# Patient Record
Sex: Male | Born: 1935
Health system: Southern US, Community
[De-identification: ages and names within clinical notes are randomized; demographics above are authoritative.]

## PROBLEM LIST (undated history)

## (undated) DIAGNOSIS — R51 Headache: Secondary | ICD-10-CM

## (undated) DIAGNOSIS — I2510 Atherosclerotic heart disease of native coronary artery without angina pectoris: Secondary | ICD-10-CM

## (undated) DIAGNOSIS — H409 Unspecified glaucoma: Secondary | ICD-10-CM

## (undated) DIAGNOSIS — I4891 Unspecified atrial fibrillation: Secondary | ICD-10-CM

## (undated) DIAGNOSIS — Z952 Presence of prosthetic heart valve: Secondary | ICD-10-CM

## (undated) DIAGNOSIS — C801 Malignant (primary) neoplasm, unspecified: Secondary | ICD-10-CM

## (undated) DIAGNOSIS — K219 Gastro-esophageal reflux disease without esophagitis: Secondary | ICD-10-CM

## (undated) DIAGNOSIS — M419 Scoliosis, unspecified: Secondary | ICD-10-CM

## (undated) DIAGNOSIS — E785 Hyperlipidemia, unspecified: Secondary | ICD-10-CM

## (undated) DIAGNOSIS — M5430 Sciatica, unspecified side: Secondary | ICD-10-CM

## (undated) DIAGNOSIS — M1990 Unspecified osteoarthritis, unspecified site: Secondary | ICD-10-CM

## (undated) DIAGNOSIS — G8929 Other chronic pain: Secondary | ICD-10-CM

## (undated) DIAGNOSIS — I429 Cardiomyopathy, unspecified: Secondary | ICD-10-CM

## (undated) DIAGNOSIS — I10 Essential (primary) hypertension: Secondary | ICD-10-CM

## (undated) DIAGNOSIS — M545 Low back pain, unspecified: Secondary | ICD-10-CM

## (undated) DIAGNOSIS — R519 Headache, unspecified: Secondary | ICD-10-CM

## (undated) DIAGNOSIS — I25719 Atherosclerosis of autologous vein coronary artery bypass graft(s) with unspecified angina pectoris: Secondary | ICD-10-CM

## (undated) DIAGNOSIS — I509 Heart failure, unspecified: Secondary | ICD-10-CM

## (undated) DIAGNOSIS — H9190 Unspecified hearing loss, unspecified ear: Secondary | ICD-10-CM

## (undated) HISTORY — DX: Unspecified glaucoma: H40.9

## (undated) HISTORY — PX: CATARACT EXTRACTION, BILATERAL: SHX1313

## (undated) HISTORY — PX: HERNIA REPAIR: SHX51

## (undated) HISTORY — DX: Presence of prosthetic heart valve: Z95.2

## (undated) HISTORY — DX: Hyperlipidemia, unspecified: E78.5

## (undated) HISTORY — DX: Essential (primary) hypertension: I10

## (undated) HISTORY — DX: Heart failure, unspecified: I50.9

## (undated) HISTORY — DX: Gastro-esophageal reflux disease without esophagitis: K21.9

## (undated) HISTORY — DX: Unspecified atrial fibrillation: I48.91

## (undated) HISTORY — DX: Malignant (primary) neoplasm, unspecified: C80.1

## (undated) HISTORY — DX: Other chronic pain: G89.29

## (undated) HISTORY — DX: Headache: R51

## (undated) HISTORY — PX: MELANOMA EXCISION: SHX5266

## (undated) HISTORY — DX: Low back pain, unspecified: M54.50

## (undated) HISTORY — DX: Sciatica, unspecified side: M54.30

## (undated) HISTORY — DX: Unspecified osteoarthritis, unspecified site: M19.90

## (undated) HISTORY — DX: Headache, unspecified: R51.9

## (undated) HISTORY — DX: Cardiomyopathy, unspecified: I42.9

## (undated) HISTORY — DX: Low back pain: M54.5

## (undated) HISTORY — DX: Atherosclerosis of autologous vein coronary artery bypass graft(s) with unspecified angina pectoris: I25.719

## (undated) HISTORY — DX: Atherosclerotic heart disease of native coronary artery without angina pectoris: I25.10

## (undated) HISTORY — DX: Scoliosis, unspecified: M41.9

---

## 1961-12-10 HISTORY — PX: NASAL SEPTUM SURGERY: SHX37

## 1992-12-10 HISTORY — PX: MITRAL VALVE REPLACEMENT: SHX147

## 2007-06-18 ENCOUNTER — Encounter: Admission: RE | Admit: 2007-06-18 | Discharge: 2007-06-18 | Payer: Self-pay | Admitting: Internal Medicine

## 2007-09-03 ENCOUNTER — Ambulatory Visit: Payer: Self-pay | Admitting: Internal Medicine

## 2007-09-03 LAB — HM DEXA SCAN

## 2008-03-10 ENCOUNTER — Ambulatory Visit: Payer: Self-pay | Admitting: Internal Medicine

## 2009-12-30 ENCOUNTER — Ambulatory Visit: Payer: Self-pay | Admitting: Internal Medicine

## 2009-12-30 LAB — FECAL OCCULT BLOOD, GUAIAC: Fecal Occult Blood: NEGATIVE

## 2011-06-25 ENCOUNTER — Ambulatory Visit: Payer: Self-pay | Admitting: Internal Medicine

## 2011-08-01 ENCOUNTER — Other Ambulatory Visit: Payer: Self-pay | Admitting: Internal Medicine

## 2011-08-06 ENCOUNTER — Other Ambulatory Visit: Payer: Self-pay | Admitting: Internal Medicine

## 2011-08-06 MED ORDER — WARFARIN SODIUM 5 MG PO TABS: 5.0000 mg | ORAL_TABLET | Freq: Every day | ORAL | Status: DC

## 2011-08-21 ENCOUNTER — Encounter: Payer: Self-pay | Admitting: Internal Medicine

## 2011-08-22 ENCOUNTER — Encounter: Payer: Self-pay | Admitting: Internal Medicine

## 2011-08-22 ENCOUNTER — Ambulatory Visit (INDEPENDENT_AMBULATORY_CARE_PROVIDER_SITE_OTHER): Payer: Medicare Other | Admitting: Internal Medicine

## 2011-08-22 VITALS — BP 172/64 | HR 60 | Temp 98.2°F | Resp 14 | Ht 68.0 in | Wt 149.0 lb

## 2011-08-22 DIAGNOSIS — G589 Mononeuropathy, unspecified: Secondary | ICD-10-CM

## 2011-08-22 DIAGNOSIS — R001 Bradycardia, unspecified: Secondary | ICD-10-CM

## 2011-08-22 DIAGNOSIS — I4891 Unspecified atrial fibrillation: Secondary | ICD-10-CM

## 2011-08-22 DIAGNOSIS — I10 Essential (primary) hypertension: Secondary | ICD-10-CM

## 2011-08-22 DIAGNOSIS — Z7901 Long term (current) use of anticoagulants: Secondary | ICD-10-CM | POA: Insufficient documentation

## 2011-08-22 DIAGNOSIS — L309 Dermatitis, unspecified: Secondary | ICD-10-CM

## 2011-08-22 DIAGNOSIS — G5790 Unspecified mononeuropathy of unspecified lower limb: Secondary | ICD-10-CM

## 2011-08-22 DIAGNOSIS — G629 Polyneuropathy, unspecified: Secondary | ICD-10-CM

## 2011-08-22 DIAGNOSIS — L259 Unspecified contact dermatitis, unspecified cause: Secondary | ICD-10-CM

## 2011-08-22 DIAGNOSIS — Z79899 Other long term (current) drug therapy: Secondary | ICD-10-CM

## 2011-08-22 LAB — COMPREHENSIVE METABOLIC PANEL
ALT: 34 U/L (ref 0–53)
AST: 40 U/L — ABNORMAL HIGH (ref 0–37)
Albumin: 4.3 g/dL (ref 3.5–5.2)
Alkaline Phosphatase: 67 U/L (ref 39–117)
Chloride: 103 mEq/L (ref 96–112)
Potassium: 4.6 mEq/L (ref 3.5–5.1)
Sodium: 141 mEq/L (ref 135–145)
Total Protein: 7.1 g/dL (ref 6.0–8.3)

## 2011-08-22 LAB — VITAMIN B12: Vitamin B-12: 889 pg/mL (ref 211–911)

## 2011-08-22 LAB — TSH: TSH: 1.19 u[IU]/mL (ref 0.35–5.50)

## 2011-08-22 MED ORDER — LOSARTAN POTASSIUM 100 MG PO TABS: 100.0000 mg | ORAL_TABLET | Freq: Every day | ORAL | Status: DC

## 2011-08-22 MED ORDER — LOSARTAN POTASSIUM 100 MG PO TABS: 50.0000 mg | ORAL_TABLET | Freq: Every day | ORAL | Status: DC

## 2011-08-22 NOTE — Assessment & Plan Note (Signed)
INR was 2.7 on Sept 10 2012.  No changes to current regimen.  Repeat monthly

## 2011-08-22 NOTE — Progress Notes (Signed)
Subjective:    Patient ID: Kevin [name], male    DOB: 1925/09/26, 77 y.o.   MRN: [id number]  HPI 77 yo white male with history of atrial fibrillaiton , htn and mitral valve replacement presents with eleated bps and low pulse for the last 2 weeks accompanied by fatigue but no syncope or vertigo.  He denies use of stimulants, oral decongestants or nsaids and no new naturceuticals. .  Deneis hot flashes, diaphoresis and chest pain or dyspnea.  Hr has a history of chronic headaches which have been a lttle bit worse lately but only after straining a muscle in his left side of neck .   Past Medical History  Diagnosis Date  . Atrial fibrillation   . Osteoporosis   . Sciatica   . Chronic headaches started age 23  . History of mitral valve replacement with mechanical valve   . Sciatica    Current Outpatient Prescriptions on File Prior to Visit  Medication Sig Dispense Refill  . amLODipine (NORVASC) 10 MG tablet Take 10 mg by mouth daily.        . Cholecalciferol (VITAMIN D) 2000 UNITS CAPS Take by mouth 2 (two) times daily.        . L-GLUTAMINE PO Take by mouth.        . methocarbamol (ROBAXIN) 750 MG tablet Take 1-2 tablets by mouth three times daily as needed       . NIACINAMIDE PO Take by mouth.        . Nutritional Supplements (PYCNOGENOL) 300-30 MG CAPS Take 1 capsule by mouth daily.        . Omega-3 Fatty Acids (OMEGA 3 PO) Take by mouth.        [name] Calcium 500 MG TABS Take two by mouth daily       . SYRINGE-NEEDLE, DISP, 3 ML (SAFETY-LOK 3CC SYR 22GX1.5") 22G X 1-1/2" 3 ML MISC 2 ml's monthly-given 1 shot monthly       . testosterone cypionate (DEPOTESTOTERONE CYPIONATE) 200 MG/ML injection Intramuscular every three weeks         Review of Systems  Constitutional: Negative for fever, chills, diaphoresis, activity change, appetite change, fatigue and unexpected weight change.  HENT: Negative for hearing loss, ear pain, nosebleeds, congestion, sore throat, facial swelling,  rhinorrhea, sneezing, drooling, mouth sores, trouble swallowing, neck pain, neck stiffness, dental problem, voice change, postnasal drip, sinus pressure, tinnitus and ear discharge.   Eyes: Negative for photophobia, pain, discharge, redness, itching and visual disturbance.  Respiratory: Negative for apnea, cough, choking, chest tightness, shortness of breath, wheezing and stridor.   Cardiovascular: Negative for chest pain, palpitations and leg swelling.  Gastrointestinal: Negative for nausea, vomiting, abdominal pain, diarrhea, constipation, blood in stool, abdominal distention, anal bleeding and rectal pain.  Genitourinary: Negative for dysuria, urgency, frequency, hematuria, flank pain, decreased urine volume, scrotal swelling, difficulty urinating and testicular pain.  Musculoskeletal: Negative for myalgias, back pain, joint swelling, arthralgias and gait problem.  Skin: Negative for color change, rash and wound.  Neurological: Negative for dizziness, tremors, seizures, syncope, speech difficulty, weakness, light-headedness, numbness and headaches.  Psychiatric/Behavioral: Negative for suicidal ideas, hallucinations, behavioral problems, confusion, sleep disturbance, dysphoric mood, decreased concentration and agitation. The patient is not nervous/anxious.    BP 172/64  Pulse 60  Temp(Src) 98.2 F (36.8 C) (Oral)  Resp 14  Ht 5\' 8"  (1.727 m)  Wt 149 lb (67.586 kg)  BMI 22.66 kg/m2  SpO2 98%     Objective:  Physical Exam  Constitutional: He is oriented to person, place, and time.  HENT:  Head: Normocephalic and atraumatic.  Mouth/Throat: Oropharynx is clear and moist.  Eyes: Conjunctivae and EOM are normal.  Neck: Normal range of motion. Neck supple. No JVD present. No thyromegaly present.  Cardiovascular: Normal rate, regular rhythm and normal heart sounds.   Pulmonary/Chest: Effort normal and breath sounds normal. He has no wheezes. He has no rales.  Abdominal: Soft. Bowel sounds  are normal. He exhibits no mass. There is no tenderness. There is no rebound.  Musculoskeletal: Normal range of motion. He exhibits no edema.  Neurological: He is alert and oriented to person, place, and time.  Skin: Skin is warm and dry.  Psychiatric: He has a normal mood and affect.          Assessment & Plan:

## 2011-08-22 NOTE — Patient Instructions (Signed)
We are increasing your losartan to 100 mg daily  For your blood pressure.  You foot do not have a fungal infection, but you are making the skin thin by using the triamcinolone steroid cream.  Please stop the cream, and try benadryl cream if needed for itching.,  We can try amirtiptyline 25 mg at bedtime for the "neuropathy" feeling in your feet.  You may increase the dose to 50 mg after one week if you do no feel any different.  Do not use robaxin while we are trying this mew medication.    If your labs are normal we will ask Dr. Juliann Pares to do a 24 hour Holter monitor to check your rhythm.

## 2011-08-25 ENCOUNTER — Encounter: Payer: Self-pay | Admitting: Internal Medicine

## 2011-08-25 DIAGNOSIS — G5790 Unspecified mononeuropathy of unspecified lower limb: Secondary | ICD-10-CM | POA: Insufficient documentation

## 2011-08-25 DIAGNOSIS — R001 Bradycardia, unspecified: Secondary | ICD-10-CM | POA: Insufficient documentation

## 2011-08-25 DIAGNOSIS — I4891 Unspecified atrial fibrillation: Secondary | ICD-10-CM | POA: Insufficient documentation

## 2011-08-25 DIAGNOSIS — Z952 Presence of prosthetic heart valve: Secondary | ICD-10-CM | POA: Insufficient documentation

## 2011-08-25 DIAGNOSIS — I10 Essential (primary) hypertension: Secondary | ICD-10-CM | POA: Insufficient documentation

## 2011-08-25 DIAGNOSIS — M5430 Sciatica, unspecified side: Secondary | ICD-10-CM | POA: Insufficient documentation

## 2011-08-25 NOTE — Assessment & Plan Note (Signed)
Current loss of control is new, with normal reading in July after starting Losartan 50 mg daily.  Will increase  to 100 mg daily and continue amlodipine at 10 mg daily.  Will need to consider secondary causes including OSA and RAS since over the summer he has required two dose titrations on medications.

## 2011-09-07 ENCOUNTER — Telehealth: Payer: Self-pay | Admitting: *Deleted

## 2011-09-07 NOTE — Telephone Encounter (Signed)
Message copied by Jobie Quaker on Fri Sep 07, 2011 10:46 AM ------      Message from: Duncan Dull      Created: Thu Sep 06, 2011  3:11 PM      Regarding: testosterone levels       His testosterone levels were recently checked by a dr Harrie Foreman in [place] and were sky high.  I need to know when they were checked in relation to dose of testosterone so I can adjust his dose. The labs were drawn on Sept 17th

## 2011-09-10 NOTE — Telephone Encounter (Signed)
Notified patient of message, he stated he had his testosterone injection on Sept. 10th and the office visit and labs with Dr. Harrie Foreman on Sept. 17th.

## 2011-09-10 NOTE — Telephone Encounter (Signed)
Based on his last testosterone level , we should decrease his regimen to 200 mcg once a month, and repeat his level in 3 months, just prior to his next injection

## 2011-09-12 NOTE — Telephone Encounter (Signed)
Notified patient of message, he stated he is due for his next injection and will get it at the new dose tomorrow.

## 2011-09-18 ENCOUNTER — Ambulatory Visit: Payer: Self-pay | Admitting: Internal Medicine

## 2011-09-21 ENCOUNTER — Encounter: Payer: Self-pay | Admitting: Internal Medicine

## 2011-09-21 ENCOUNTER — Ambulatory Visit (INDEPENDENT_AMBULATORY_CARE_PROVIDER_SITE_OTHER): Payer: Medicare Other | Admitting: Internal Medicine

## 2011-09-21 DIAGNOSIS — R001 Bradycardia, unspecified: Secondary | ICD-10-CM

## 2011-09-21 DIAGNOSIS — E291 Testicular hypofunction: Secondary | ICD-10-CM

## 2011-09-21 DIAGNOSIS — I10 Essential (primary) hypertension: Secondary | ICD-10-CM

## 2011-09-21 DIAGNOSIS — I498 Other specified cardiac arrhythmias: Secondary | ICD-10-CM

## 2011-09-21 DIAGNOSIS — G5790 Unspecified mononeuropathy of unspecified lower limb: Secondary | ICD-10-CM

## 2011-09-21 DIAGNOSIS — G629 Polyneuropathy, unspecified: Secondary | ICD-10-CM

## 2011-09-21 DIAGNOSIS — G589 Mononeuropathy, unspecified: Secondary | ICD-10-CM

## 2011-09-21 MED ORDER — AMITRIPTYLINE HCL 25 MG PO TABS: 25.0000 mg | ORAL_TABLET | Freq: Every day | ORAL | Status: DC

## 2011-09-21 MED ORDER — TESTOSTERONE CYPIONATE 200 MG/ML IM SOLN: 100.0000 mg | INTRAMUSCULAR | Status: DC

## 2011-09-21 NOTE — Patient Instructions (Addendum)
You may use the new amitritipyline tablet (25 mg dose)  and cut it in half to take at bedtime.    Your next textosterone level (last one owas Oct 4) should be November 4,  And the dose will be 100 mcg   We will arrange the new testosterone schedule with Boyd Kerbs to lower your dose.  We will send the stress test reults to Dr. Harrie Foreman as soon as we get them, per your request  [phone]  Fax,    phone  [phone]

## 2011-09-23 ENCOUNTER — Encounter: Payer: Self-pay | Admitting: Internal Medicine

## 2011-09-23 DIAGNOSIS — E349 Endocrine disorder, unspecified: Secondary | ICD-10-CM | POA: Insufficient documentation

## 2011-09-23 NOTE — Progress Notes (Signed)
Subjective:    Patient ID: Kevin [name], male    DOB: 1925-06-23, 77 y.o.   MRN: [id number]  HPI  Kevin [name] returns for followup on elebated blood pressure, bradycardai anf fatigue reported one month ago.  In the interim he has had a stress test by Dr. Dwan Bolt and his DO , Dr. Midge Minium checked his testosterone level and it was sky high. The level was check a few days after his most recent testosterone injection of 200 mcg, done by [place] at [facility]. He feels less fatigued lately but has not heard the results if his stress test done earlier this month. The report has not been received by our office either.  Past Medical History  Diagnosis Date  . Atrial fibrillation   . Osteoporosis   . Sciatica   . Chronic headaches started age 29  . History of mitral valve replacement with mechanical valve   . Sciatica     Current Outpatient Prescriptions on File Prior to Visit  Medication Sig Dispense Refill  . amLODipine (NORVASC) 10 MG tablet Take 10 mg by mouth daily.        . brimonidine (ALPHAGAN) 0.15 % ophthalmic solution       . butalbital-acetaminophen-caffeine (FIORICET, ESGIC) 50-325-40 MG per tablet Take 1 tablet by mouth 2 (two) times daily as needed.        . Cholecalciferol (VITAMIN D) 2000 UNITS CAPS Take by mouth 2 (two) times daily.        . L-GLUTAMINE PO Take by mouth.        . latanoprost (XALATAN) 0.005 % ophthalmic solution       . losartan (COZAAR) 100 MG tablet Take 1 tablet (100 mg total) by mouth daily.  30 tablet  3  . methocarbamol (ROBAXIN) 750 MG tablet Take 1-2 tablets by mouth three times daily as needed       . Multiple Vitamin (MULTIVITAMIN) tablet Take 1 tablet by mouth daily.        Marland Kitchen NIACINAMIDE PO Take by mouth.        . Nutritional Supplements (PYCNOGENOL) 300-30 MG CAPS Take 1 capsule by mouth daily.        . Omega-3 Fatty Acids (OMEGA 3 PO) Take by mouth.        Marland Kitchen omeprazole (PRILOSEC) 10 MG capsule Take 10 mg by mouth daily.        [name] Calcium  500 MG TABS Take two by mouth daily       . Probiotic Product (PROBIOTIC FORMULA PO) Take by mouth.        . Red Yeast Rice Extract (RED YEAST RICE PO) Take by mouth.        . SYRINGE-NEEDLE, DISP, 3 ML (SAFETY-LOK 3CC SYR 22GX1.5") 22G X 1-1/2" 3 ML MISC 2 ml's monthly-given 1 shot monthly       . TURMERIC PO Take by mouth.        . warfarin (COUMADIN) 5 MG tablet Take 5 mg by mouth daily. On three days a week take 6 mg          Review of Systems  Constitutional: Negative for fever, chills, diaphoresis, activity change, appetite change, fatigue and unexpected weight change.  HENT: Negative for hearing loss, ear pain, nosebleeds, congestion, sore throat, facial swelling, rhinorrhea, sneezing, drooling, mouth sores, trouble swallowing, neck pain, neck stiffness, dental problem, voice change, postnasal drip, sinus pressure, tinnitus and ear discharge.   Eyes: Negative for photophobia, pain, discharge, redness,  itching and visual disturbance.  Respiratory: Negative for apnea, cough, choking, chest tightness, shortness of breath, wheezing and stridor.   Cardiovascular: Negative for chest pain, palpitations and leg swelling.  Gastrointestinal: Negative for nausea, vomiting, abdominal pain, diarrhea, constipation, blood in stool, abdominal distention, anal bleeding and rectal pain.  Genitourinary: Negative for dysuria, urgency, frequency, hematuria, flank pain, decreased urine volume, scrotal swelling, difficulty urinating and testicular pain.  Musculoskeletal: Negative for myalgias, back pain, joint swelling, arthralgias and gait problem.  Skin: Negative for color change, rash and wound.  Neurological: Negative for dizziness, tremors, seizures, syncope, speech difficulty, weakness, light-headedness, numbness and headaches.  Psychiatric/Behavioral: Negative for suicidal ideas, hallucinations, behavioral problems, confusion, sleep disturbance, dysphoric mood, decreased concentration and agitation. The  patient is not nervous/anxious.       BP 140/50  Pulse 69  Temp(Src) 98.1 F (36.7 C) (Oral)  Resp 14  Ht 5\' 4"  (1.626 m)  Wt 151 lb 8 oz (68.72 kg)  BMI 26.00 kg/m2  SpO2 96%  Objective:   Physical Exam  Constitutional: He is oriented to person, place, and time.  HENT:  Head: Normocephalic and atraumatic.  Mouth/Throat: Oropharynx is clear and moist.  Eyes: Conjunctivae and EOM are normal.  Neck: Normal range of motion. Neck supple. No JVD present. No thyromegaly present.  Cardiovascular: Normal rate, regular rhythm and normal heart sounds.   Pulmonary/Chest: Effort normal and breath sounds normal. He has no wheezes. He has no rales.  Abdominal: Soft. Bowel sounds are normal. He exhibits no mass. There is no tenderness. There is no rebound.  Musculoskeletal: Normal range of motion. He exhibits no edema.  Neurological: He is alert and oriented to person, place, and time.  Skin: Skin is warm and dry.  Psychiatric: He has a normal mood and affect.          Assessment & Plan:

## 2011-09-23 NOTE — Assessment & Plan Note (Addendum)
His dose has been reduced today  to 100 mcg monthly due to recent supratherapeutic level.  Will recheck just before his next injection in three months

## 2011-09-23 NOTE — Assessment & Plan Note (Addendum)
improved control on current regimen,  With resolution of bradycardia.

## 2011-09-23 NOTE — Assessment & Plan Note (Signed)
Results of cardiology evaluation arte pending

## 2011-09-23 NOTE — Assessment & Plan Note (Signed)
He has been taking the elavil butting his pill in 1/4s which has been probelmatic,  Will reduce dose given report of sedation and retry.  Reversible causes have been ruled out.

## 2011-10-26 ENCOUNTER — Telehealth: Payer: Self-pay | Admitting: Internal Medicine

## 2011-10-26 NOTE — Telephone Encounter (Signed)
Left detailed message notifying patient.

## 2011-10-26 NOTE — Telephone Encounter (Signed)
Coumadin level is fine.  No changes, repeat in on e month at [facility] via [facility]

## 2011-11-15 ENCOUNTER — Other Ambulatory Visit: Payer: Self-pay | Admitting: Internal Medicine

## 2011-11-20 LAB — PROTIME-INR

## 2011-11-21 ENCOUNTER — Telehealth: Payer: Self-pay | Admitting: Internal Medicine

## 2011-11-21 NOTE — Telephone Encounter (Signed)
His PT/INR is low .  Has he missed any doses?  What is his current regimen>

## 2011-11-22 NOTE — Telephone Encounter (Signed)
Ok, resume regular regimen and recheck in 2 weeks

## 2011-11-22 NOTE — Telephone Encounter (Signed)
Patient is currently taking 5 mg 4 days a week and 6 mg 3 days a week. He thinks he might have missed one or two doses.

## 2011-11-22 NOTE — Telephone Encounter (Signed)
Patient notified. Will recheck in 2 weeks.

## 2011-12-06 ENCOUNTER — Telehealth: Payer: Self-pay | Admitting: Internal Medicine

## 2011-12-06 NOTE — Telephone Encounter (Signed)
PT/INR drawn on 12/26 was 58.2/5.7 , faxed over by [facility] without any critical level notice.  Received on 12/27.  Patient instructed to suspend coumadin and have [facility] RN recheck on Friday 12/28.  Current regimein 6 mg coumadin 3 days per week, 5mg  all other days,  For goal INR 2.5 to 3.5.  Recently started a probiotic without notifying me.  Patinet advised to stop probiotic.

## 2011-12-07 ENCOUNTER — Telehealth: Payer: Self-pay | Admitting: *Deleted

## 2011-12-07 NOTE — Telephone Encounter (Signed)
Per Dr. Darrick Huntsman-  No coumadin over the weekend and have PT/INR rechecked on Monday. Confirmed no bleeding or bruising. Advised if he started bruising or bleeding to go to ER. We will notify patient of the next step when next results are received.   Patient advised of above and verbalized understanding.

## 2011-12-07 NOTE — Telephone Encounter (Signed)
Joy from [facility] called with STAT results:  PT= 61.0 INR= 6.0

## 2011-12-09 ENCOUNTER — Other Ambulatory Visit: Payer: Self-pay | Admitting: Internal Medicine

## 2011-12-10 ENCOUNTER — Telehealth: Payer: Self-pay | Admitting: *Deleted

## 2011-12-10 LAB — PROTIME-INR

## 2011-12-10 NOTE — Telephone Encounter (Signed)
Patient notified. Per Dr. Darrick Huntsman he is to resume regular dose of coumadin and have PT/INR checked in 2 weeks.

## 2011-12-10 NOTE — Telephone Encounter (Signed)
[facility] called with protime results- PT 16.7, INR 1.6.

## 2011-12-11 HISTORY — PX: CARDIOVERSION: SHX1299

## 2011-12-20 LAB — PROTIME-INR

## 2011-12-21 ENCOUNTER — Telehealth: Payer: Self-pay | Admitting: *Deleted

## 2011-12-21 NOTE — Telephone Encounter (Signed)
Per Dr. Darrick Huntsman, advised pt that his INR is again to high.  Advised him to hold coumadin until 1/13, start back that day at 5 mg's daily.  Recheck 2 weeks.  Pt has appt to be seen here on 1/14.

## 2011-12-24 ENCOUNTER — Ambulatory Visit (INDEPENDENT_AMBULATORY_CARE_PROVIDER_SITE_OTHER): Payer: Medicare Other | Admitting: Internal Medicine

## 2011-12-24 ENCOUNTER — Encounter: Payer: Self-pay | Admitting: Internal Medicine

## 2011-12-24 DIAGNOSIS — R51 Headache: Secondary | ICD-10-CM

## 2011-12-24 DIAGNOSIS — I10 Essential (primary) hypertension: Secondary | ICD-10-CM

## 2011-12-24 DIAGNOSIS — F32A Depression, unspecified: Secondary | ICD-10-CM | POA: Insufficient documentation

## 2011-12-24 DIAGNOSIS — F329 Major depressive disorder, single episode, unspecified: Secondary | ICD-10-CM

## 2011-12-24 DIAGNOSIS — Z7901 Long term (current) use of anticoagulants: Secondary | ICD-10-CM

## 2011-12-24 DIAGNOSIS — E291 Testicular hypofunction: Secondary | ICD-10-CM

## 2011-12-24 MED ORDER — TESTOSTERONE CYPIONATE 200 MG/ML IM SOLN: 100.0000 mg | INTRAMUSCULAR | Status: DC

## 2011-12-24 NOTE — Patient Instructions (Signed)
Resume your probiotic,  And reduce your coumadin dose to 3 mg daily.  Repeat your PT/INR  In 0ne week.

## 2011-12-24 NOTE — Assessment & Plan Note (Addendum)
previously fiorcet dependent.  He has stopped using fiorcet 6 weeks ago and uses only prn tylenol 4 times on average per week

## 2011-12-24 NOTE — Assessment & Plan Note (Signed)
Well controlled on current regimen. Renal function stable, no changes today. 

## 2011-12-24 NOTE — Assessment & Plan Note (Signed)
He has had recent difficulty maintaining a therapeutic INR secondary to recent use of a new probiotic, which was stopped when his INR increased to 6.  Since he wants to resume the probiotic for his bowel health,  Will decrease his coumadin dose t 3 mg daily and recheck PT/inr in one week.

## 2011-12-24 NOTE — Progress Notes (Signed)
Subjective:    Patient ID: Kevin [name], male    DOB: [birth year], 78 y.o.   MRN: [id number]  HPI  78 yo white male with history of chronic headaches, atrial fibrillation  With mitral valve replacement, and low testosterone, presents for regular follow up.  He recently stopped his fioricet 6 weeks ago and the urging of his other physician.  He stopped it abruptly. Felt miserable for 3 weeks due to malaise and body aches but symptoms eventually resolved.  He continues to have mild headaches at the rate of 4 or 5 per week.  He is using tylenol prn headaches are usually frontal in location but occasionally involve the left side in the back of the head. His other physician raised the possibility that he is suffering from depression. 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 He is exercising several days per week.  Past Medical History  Diagnosis Date  . Atrial fibrillation   . Sciatica   . Chronic headaches started age 89  . History of mitral valve replacement with mechanical valve   . Sciatica   . Osteoporosis     secondary to low testoerone    Current Outpatient Prescriptions on File Prior to Visit  Medication Sig Dispense Refill  . amitriptyline (ELAVIL) 25 MG tablet Take 1 tablet (25 mg total) by mouth at bedtime.  30 tablet  2  . amLODipine (NORVASC) 10 MG tablet Take 10 mg by mouth daily.        . brimonidine (ALPHAGAN) 0.15 % ophthalmic solution       . Cholecalciferol (VITAMIN D) 2000 UNITS CAPS Take by mouth 2 (two) times daily.        . L-GLUTAMINE PO Take by mouth.        . latanoprost (XALATAN) 0.005 % ophthalmic solution       . losartan (COZAAR) 100 MG tablet TAKE 1 TABLET BY MOUTH EVERY DAY  30 tablet  2  . methocarbamol (ROBAXIN) 750 MG tablet Take 1-2 tablets by mouth three times daily as needed       . Multiple Vitamin (MULTIVITAMIN) tablet Take 1  tablet by mouth daily.        Marland Kitchen NIACINAMIDE PO Take by mouth.        . Nutritional Supplements (PYCNOGENOL) 300-30 MG CAPS Take 1 capsule by mouth daily.        . Omega-3 Fatty Acids (OMEGA 3 PO) Take by mouth.        Marland Kitchen omeprazole (PRILOSEC) 10 MG capsule Take 10 mg by mouth daily.        [name] Calcium 500 MG TABS Take two by mouth daily       . Probiotic Product (PROBIOTIC FORMULA PO) Take by mouth.        . Red Yeast Rice Extract (RED YEAST RICE PO) Take by mouth.        . SYRINGE-NEEDLE, DISP, 3 ML (SAFETY-LOK 3CC SYR 22GX1.5") 22G X 1-1/2" 3 ML MISC 2 ml's monthly-given 1 shot monthly       . TURMERIC PO Take by mouth.        . warfarin (COUMADIN) 3 MG tablet TAKE 2 TABLETS BY MOUTH DAILY  60 tablet  0  . warfarin (COUMADIN) 5 MG tablet Take 5 mg by mouth daily. On three days a week take 6 mg       .  butalbital-acetaminophen-caffeine (FIORICET, ESGIC) 50-325-40 MG per tablet Take 1 tablet by mouth 2 (two) times daily as needed.          Review of Systems  Constitutional: Negative for fever, chills, diaphoresis, activity change, appetite change, fatigue and unexpected weight change.  HENT: Negative for hearing loss, ear pain, nosebleeds, congestion, sore throat, facial swelling, rhinorrhea, sneezing, drooling, mouth sores, trouble swallowing, neck pain, neck stiffness, dental problem, voice change, postnasal drip, sinus pressure, tinnitus and ear discharge.   Eyes: Negative for photophobia, pain, discharge, redness, itching and visual disturbance.  Respiratory: Negative for apnea, cough, choking, chest tightness, shortness of breath, wheezing and stridor.   Cardiovascular: Negative for chest pain, palpitations and leg swelling.  Gastrointestinal: Negative for nausea, vomiting, abdominal pain, diarrhea, constipation, blood in stool, abdominal distention, anal bleeding and rectal pain.  Genitourinary: Negative for dysuria, urgency, frequency, hematuria, flank pain, decreased urine  volume, scrotal swelling, difficulty urinating and testicular pain.  Musculoskeletal: Negative for myalgias, back pain, joint swelling, arthralgias and gait problem.  Skin: Negative for color change, rash and wound.  Neurological: Negative for dizziness, tremors, seizures, syncope, speech difficulty, weakness, light-headedness, numbness and headaches.  Psychiatric/Behavioral: Negative for suicidal ideas, hallucinations, behavioral problems, confusion, sleep disturbance, dysphoric mood, decreased concentration and agitation. The patient is not nervous/anxious.        Objective:   Physical Exam  Constitutional: He is oriented to person, place, and time.  HENT:  Head: Normocephalic and atraumatic.  Mouth/Throat: Oropharynx is clear and moist.  Eyes: Conjunctivae and EOM are normal.  Neck: Normal range of motion. Neck supple. No JVD present. No thyromegaly present.  Cardiovascular: Normal rate, regular rhythm and normal heart sounds.   Pulmonary/Chest: Effort normal and breath sounds normal. He has no wheezes. He has no rales.  Abdominal: Soft. Bowel sounds are normal. He exhibits no mass. There is no tenderness. There is no rebound.  Musculoskeletal: Normal range of motion. He exhibits no edema.  Neurological: He is alert and oriented to person, place, and time.  Skin: Skin is warm and dry.  Psychiatric: He has a normal mood and affect.          Assessment & Plan:

## 2011-12-24 NOTE — Assessment & Plan Note (Signed)
Discussed mood which has been altered since stopping the fioricet.  Discussed possible trial of lexapro in a few weeks if no improvement

## 2011-12-31 LAB — PROTIME-INR

## 2012-01-01 ENCOUNTER — Telehealth: Payer: Self-pay | Admitting: Internal Medicine

## 2012-01-01 NOTE — Telephone Encounter (Signed)
Patient notified

## 2012-01-01 NOTE — Telephone Encounter (Signed)
Left message asking patient to return my call.

## 2012-01-01 NOTE — Telephone Encounter (Signed)
His coumadin is low on 3 mg daily.  I woul like him to increase to 6 mg on Tuesdays and  Fridays and Sundays  And repeat  PT/. INR in 2 weeks

## 2012-01-07 LAB — PROTIME-INR

## 2012-01-08 ENCOUNTER — Telehealth: Payer: Self-pay | Admitting: *Deleted

## 2012-01-08 NOTE — Telephone Encounter (Signed)
Then, lets have him change to 3mg  daily except for Saturday 6mg .  Follow up INR 1 week.

## 2012-01-08 NOTE — Telephone Encounter (Signed)
Faxed PT report given to Dr. Dan Humphreys in Dr. Melina Schools absence.  Result is 3.6.

## 2012-01-08 NOTE — Telephone Encounter (Signed)
Pt states he takes 3 mg's on Sunday, Monday, Wednesday and Friday and he takes 6 mg's on Tuesday, Thursday and Saturday.

## 2012-01-08 NOTE — Telephone Encounter (Signed)
Based on chart, it appears that coumadin dose is 3mg  daily. If this is correct, I would favor reducing dose to 2mg  daily and repeating INR in 1 week.

## 2012-01-08 NOTE — Telephone Encounter (Signed)
Left message x2 for patient to call back.

## 2012-01-08 NOTE — Telephone Encounter (Signed)
Advised pt

## 2012-01-09 ENCOUNTER — Telehealth: Payer: Self-pay | Admitting: *Deleted

## 2012-01-09 NOTE — Telephone Encounter (Signed)
Prior Berkley Harvey is needed for testosterone, form is in red folder.

## 2012-01-10 NOTE — Telephone Encounter (Signed)
Form completed and faxed back

## 2012-01-11 NOTE — Telephone Encounter (Signed)
Prior auth given for testosterone, approval letter placed in doctor's office for signature and scanning.

## 2012-01-14 ENCOUNTER — Encounter: Payer: Self-pay | Admitting: Internal Medicine

## 2012-01-14 LAB — PROTIME-INR

## 2012-01-16 ENCOUNTER — Telehealth: Payer: Self-pay | Admitting: Internal Medicine

## 2012-01-16 NOTE — Telephone Encounter (Signed)
PT/INR is therapeutic on current regimen.  Repeat in one month

## 2012-01-16 NOTE — Telephone Encounter (Signed)
Advised pt of results.

## 2012-01-28 ENCOUNTER — Other Ambulatory Visit: Payer: Self-pay | Admitting: Internal Medicine

## 2012-01-31 ENCOUNTER — Other Ambulatory Visit: Payer: Self-pay | Admitting: *Deleted

## 2012-01-31 MED ORDER — WARFARIN SODIUM 5 MG PO TABS: 5.0000 mg | ORAL_TABLET | Freq: Every day | ORAL | Status: DC

## 2012-02-03 ENCOUNTER — Other Ambulatory Visit: Payer: Self-pay | Admitting: Internal Medicine

## 2012-02-13 ENCOUNTER — Encounter: Payer: Self-pay | Admitting: Internal Medicine

## 2012-02-13 DIAGNOSIS — K219 Gastro-esophageal reflux disease without esophagitis: Secondary | ICD-10-CM

## 2012-02-15 ENCOUNTER — Telehealth: Payer: Self-pay | Admitting: Internal Medicine

## 2012-02-15 NOTE — Telephone Encounter (Signed)
Patient notified of results.

## 2012-02-15 NOTE — Telephone Encounter (Signed)
Coumadin level is therapeutic,  Continue current regimen and repeat PT/INR in one month 

## 2012-02-21 ENCOUNTER — Encounter: Payer: Self-pay | Admitting: Internal Medicine

## 2012-02-21 DIAGNOSIS — K219 Gastro-esophageal reflux disease without esophagitis: Secondary | ICD-10-CM | POA: Insufficient documentation

## 2012-03-03 ENCOUNTER — Telehealth: Payer: Self-pay | Admitting: Internal Medicine

## 2012-03-03 NOTE — Telephone Encounter (Signed)
Patient called and stated the amitriptyline that he has been taking is causing him dry mouth.  He wanted to know if you could suggest something similar that would not cause dry mouth.  Please advise.

## 2012-03-03 NOTE — Telephone Encounter (Signed)
That is a common side effect of amitriptyline.  Kevin [name] is on such a low dose,  He may be able to stop it an not require anything in  Its place.  Let's stop it and see what changes.

## 2012-03-04 NOTE — Telephone Encounter (Signed)
Patient notified of message, he will stop the amitriptyline.   Chart updated.

## 2012-03-10 LAB — PROTIME-INR

## 2012-03-11 ENCOUNTER — Telehealth: Payer: Self-pay | Admitting: Internal Medicine

## 2012-03-11 NOTE — Telephone Encounter (Signed)
Coumadin level is therapeutic,  Continue current regimen and repeat PT/INR in one month 

## 2012-03-12 NOTE — Telephone Encounter (Signed)
Left message asking patient to return my call.

## 2012-03-13 ENCOUNTER — Encounter: Payer: Self-pay | Admitting: Internal Medicine

## 2012-03-13 ENCOUNTER — Ambulatory Visit (INDEPENDENT_AMBULATORY_CARE_PROVIDER_SITE_OTHER): Payer: Medicare Other | Admitting: Internal Medicine

## 2012-03-13 VITALS — BP 120/56 | HR 68 | Temp 97.6°F | Resp 14 | Wt 152.0 lb

## 2012-03-13 DIAGNOSIS — I10 Essential (primary) hypertension: Secondary | ICD-10-CM

## 2012-03-13 DIAGNOSIS — K759 Inflammatory liver disease, unspecified: Secondary | ICD-10-CM

## 2012-03-13 DIAGNOSIS — R51 Headache: Secondary | ICD-10-CM

## 2012-03-13 DIAGNOSIS — E291 Testicular hypofunction: Secondary | ICD-10-CM

## 2012-03-13 DIAGNOSIS — G5790 Unspecified mononeuropathy of unspecified lower limb: Secondary | ICD-10-CM

## 2012-03-13 DIAGNOSIS — G8929 Other chronic pain: Secondary | ICD-10-CM

## 2012-03-13 DIAGNOSIS — Z952 Presence of prosthetic heart valve: Secondary | ICD-10-CM

## 2012-03-13 DIAGNOSIS — R7989 Other specified abnormal findings of blood chemistry: Secondary | ICD-10-CM

## 2012-03-13 DIAGNOSIS — Z79899 Other long term (current) drug therapy: Secondary | ICD-10-CM

## 2012-03-13 NOTE — Progress Notes (Signed)
Patient ID: Kevin [name], male   DOB: November 10, 1925, 78 y.o.   MRN: [id number]   Patient Active Problem List  Diagnoses  . Encounter for long-term (current) use of anticoagulants  . Atrial fibrillation  . Chronic headaches  . History of mitral valve replacement with mechanical valve  . Sciatica  . Hypertension  . Bradycardia  . Neuropathy of lower extremity  . Low testosterone  . Depression  . Esophageal reflux    Subjective:  CC:   Chief Complaint  Patient presents with  . Follow-up    HPI:   Kevin [name] a 78 y.o. male who presents  For fllow up on chronic medical conditions including atrial fibrillation, hypertension, neuropathy, chronic headaches, low testosterone and osteoporosis.  He feels generally well. He has finally stopped using daily fioricet at Dr. Kendrick Ranch advice.  He is using 1 tylenol daily for headache management.  Has not started any new "natural" supplements after the last probiotic one caused his INR to become supratherapeutic, requiring suspension and modification of his coumadin regimen. His neuropathy has not worsened since stopping the evening dose of elavil which was causing excessive sedation.  The neuropathy is not keeping him awake at night and not associated with pain . He had an elevated testosterone level several months ago (forwarded to [place] by Dr. Isabell Jarvis in Sept) and he was supposed to have a repeat level just prior to his 3 week injection but this has not occurred yet.    Past Medical History  Diagnosis Date  . Atrial fibrillation   . Sciatica   . Chronic headaches started age 89  . History of mitral valve replacement with mechanical valve   . Sciatica   . Osteoporosis     secondary to low testoerone  . Cancer     melanoma- right ear    Past Surgical History  Procedure Date  . Nasal septum surgery 1963  . Cataract extraction, bilateral     x 2  . Melanoma excision     right ear  . Mitral valve replacement 1994  . Hernia repair    x 2         The following portions of the patient's history were reviewed and updated as appropriate: Allergies, current medications, and problem list.    Review of Systems:   12 Pt  review of systems was negative except those addressed in the HPI,     History   Social History  . Marital Status: Widowed    Spouse Name: N/A    Number of Children: N/A  . Years of Education: N/A   Occupational History  . Not on file.   Social History Main Topics  . Smoking status: Never Smoker   . Smokeless tobacco: Never Used  . Alcohol Use: No  . Drug Use: No  . Sexually Active: Not on file   Other Topics Concern  . Not on file   Social History Narrative   Exercise; light 3 x weekLives alone, widowed at [employer]    Objective:  BP 120/56  Pulse 68  Temp(Src) 97.6 F (36.4 C) (Oral)  Resp 14  Wt 152 lb (68.947 kg)  SpO2 97%  General appearance: alert, cooperative and appears stated age Ears: normal TM's and external ear canals both ears Throat: lips, mucosa, and tongue normal; teeth and gums normal Neck: no adenopathy, no carotid bruit, supple, symmetrical, trachea midline and thyroid not enlarged, symmetric, no tenderness/mass/nodules Back: symmetric, no curvature. ROM normal. No CVA  tenderness. Lungs: clear to auscultation bilaterally Heart: regular rate and rhythm, S1, S2 normal, no murmur, click, rub or gallop Abdomen: soft, non-tender; bowel sounds normal; no masses,  no organomegaly Pulses: 2+ and symmetric Skin: Skin color, texture, turgor normal. No rashes or lesions Lymph nodes: Cervical, supraclavicular, and axillary nodes normal.  Assessment and Plan:  Low testosterone in the setting of osteoporosis.  Receiving regular IM injections every 3 weeks,  With repeat level needed prior to next injection.    Neuropathy of lower extremity Reversible causes ruled out previously.  Elavil caused excessive sedation.  Symptosm are tolerable without additional meds at  this point.   Chronic headaches Previously managed with daily use of fiorcet which he has finally stopped at Dr. Kendrick Ranch urging.   Hypertension well controlled on current regimen.  No changes today.   History of mitral valve replacement with mechanical valve Chronic anticoagulation managed with coumadin for goal INR 2. To 3.5      Updated Medication List Outpatient Encounter Prescriptions as of 03/13/2012  Medication Sig Dispense Refill  . amLODipine (NORVASC) 10 MG tablet Take 10 mg by mouth daily.        . brimonidine (ALPHAGAN) 0.15 % ophthalmic solution       . Cholecalciferol (VITAMIN D) 2000 UNITS CAPS Take by mouth 2 (two) times daily.        . L-GLUTAMINE PO Take by mouth.        . latanoprost (XALATAN) 0.005 % ophthalmic solution       . losartan (COZAAR) 100 MG tablet TAKE ONE TABLET BY MOUTH DAILY  90 tablet  0  . Multiple Vitamin (MULTIVITAMIN) tablet Take 1 tablet by mouth daily.        Marland Kitchen NIACINAMIDE PO Take by mouth.        . Nutritional Supplements (PYCNOGENOL) 300-30 MG CAPS Take 1 capsule by mouth daily.        . Omega-3 Fatty Acids (OMEGA 3 PO) Take by mouth.        Marland Kitchen omeprazole (PRILOSEC) 10 MG capsule Take 10 mg by mouth daily.        [name] Calcium 500 MG TABS Take two by mouth daily       . Probiotic Product (PROBIOTIC FORMULA PO) Take by mouth.        . Red Yeast Rice Extract (RED YEAST RICE PO) Take by mouth.        . SYRINGE-NEEDLE, DISP, 3 ML (SAFETY-LOK 3CC SYR 22GX1.5") 22G X 1-1/2" 3 ML MISC 2 ml's monthly-given 1 shot monthly       . testosterone cypionate (DEPOTESTOTERONE CYPIONATE) 200 MG/ML injection Inject 0.5 mLs (100 mg total) into the muscle every 21 ( twenty-one) days.  10 mL  1  . TURMERIC PO Take by mouth.        . warfarin (COUMADIN) 3 MG tablet       . DISCONTD: warfarin (COUMADIN) 3 MG tablet TAKE 2 TABLETS BY MOUTH DAILY  60 tablet  0  . DISCONTD: methocarbamol (ROBAXIN) 750 MG tablet Take 1-2 tablets by mouth three times daily  as needed       . DISCONTD: warfarin (COUMADIN) 5 MG tablet Take 1 tablet (5 mg total) by mouth daily.  90 tablet  0     Orders Placed This Encounter  Procedures  . COMPLETE METABOLIC PANEL WITH GFR  . Direct LDL  . Gamma GT  . Hepatic function panel  . Hepatitis, Acute  .  Testosterone, free, total    No Follow-up on file.

## 2012-03-13 NOTE — Telephone Encounter (Signed)
Patient notified during his office visit today.

## 2012-03-13 NOTE — Patient Instructions (Addendum)
You may try Tylenol PM as an alternative to a amitryptiline for sleep.    If it does not work,  Call for an alternative.   If you stop the probiotic,  Let me know so we can recheck your protime.    Your left sided  chest pain and right groin pain appeat to be due to muscle strain.

## 2012-03-14 LAB — COMPLETE METABOLIC PANEL WITH GFR
ALT: 25 U/L (ref 0–53)
AST: 48 U/L — ABNORMAL HIGH (ref 0–37)
Alkaline Phosphatase: 61 U/L (ref 39–117)
Creat: 0.96 mg/dL (ref 0.50–1.35)
Sodium: 138 mEq/L (ref 135–145)
Total Bilirubin: 0.6 mg/dL (ref 0.3–1.2)
Total Protein: 6.7 g/dL (ref 6.0–8.3)

## 2012-03-16 ENCOUNTER — Telehealth: Payer: Self-pay | Admitting: Internal Medicine

## 2012-03-16 ENCOUNTER — Encounter: Payer: Self-pay | Admitting: Internal Medicine

## 2012-03-16 NOTE — Telephone Encounter (Signed)
I noticed after his last visit that he had not had his testosterone level rechecked yet since Dr. Isabell Jarvis had checked it in September a few days after his last shot, and found it to  Be high. we discussed the need to check it just before his next injection.  (Every 3 weeks [facility] [place] [facility] hi his injection.) We need to coordinate the labs I have ordered with his next injection and have them drawn  Just before it. Since his coumadin level has been stable we can get that on the same day even if it's late by a week.

## 2012-03-16 NOTE — Assessment & Plan Note (Signed)
Reversible causes ruled out previously.  Elavil caused excessive sedation.  Symptosm are tolerable without additional meds at this point.

## 2012-03-16 NOTE — Assessment & Plan Note (Signed)
in the setting of osteoporosis.  Receiving regular IM injections every 3 weeks,  With repeat level needed prior to next injection.

## 2012-03-16 NOTE — Assessment & Plan Note (Signed)
Chronic anticoagulation managed with coumadin for goal INR 2. To 3.5

## 2012-03-16 NOTE — Assessment & Plan Note (Signed)
Previously managed with daily use of fiorcet which he has finally stopped at Dr. Kendrick Ranch urging.

## 2012-03-16 NOTE — Assessment & Plan Note (Signed)
well controlled on current regimen.  No changes today.

## 2012-03-17 NOTE — Telephone Encounter (Signed)
Left message asking patient to return my call.

## 2012-03-18 ENCOUNTER — Other Ambulatory Visit: Payer: Self-pay | Admitting: Internal Medicine

## 2012-03-18 NOTE — Telephone Encounter (Signed)
Left another message for patient to return my call.

## 2012-03-18 NOTE — Telephone Encounter (Signed)
[facility] Triage Call Report Triage Record Num: [id number] Operator: Chevis Pretty Patient Name: Kevin [name] Call Date & Time: 03/18/2012 10:28:02AM Patient Phone: [phone] PCP: Duncan Dull Patient Gender: Male PCP Fax : [phone] Patient DOB: [birth year] Practice Name: [facility] Reason for Call: Caller: [name]/Patient; PCP: Duncan Dull; CB#: [phone]; ; ; Call regarding Returning Call To [facility]; She Contacted Him 03/17/12 and He Has Been Unable To Reach Her.; Per Epic, Dr. Darrick Huntsman wants testosterone level drawn before next injection. States his next injection is 03/24/12. His INR is due to be checked 03/31/12. Dr. Darrick Huntsman would like to coordinate his lab draws; patient states he has his bloodwork drawn at [facility], and is happy to have labs done "whenever Dr. Darrick Huntsman needs them." INFO TO OFFICE FOR STAFF REVIEW/CALLBACK. Patient unavailable 1120-1330. MAY REACH PATIENT AT [phone]. Protocol(s) Used: Office Note Recommended Outcome per Protocol: Information Noted and Sent to Office Reason for Outcome: Caller information to office Care Advice: ~ 03/18/2012 10:38:29AM Page 1 of 1 [id number]

## 2012-03-18 NOTE — Telephone Encounter (Signed)
Caller: Kevin [name]/Patient; PCP: Duncan Dull; CB#: [phone]; ; ; Call regarding Returning Call To [facility]; She Contacted Him 03/17/12 and He Has Been Unable To Reach Her.; Per Epic, Dr. Darrick Huntsman wants testosterone level drawn before next injection.  States his next injection is 03/24/12.  His INR is due to be checked 03/31/12.  Dr. Darrick Huntsman would like to coordinate his lab draws; patient states he has his bloodwork drawn at [facility], and is happy to have labs done "whenever Dr. Darrick Huntsman needs them."  INFO TO OFFICE FOR STAFF REVIEW/CALLBACK. Patient unavailable 1120-1330.  MAY REACH PATIENT AT [phone].

## 2012-03-20 ENCOUNTER — Encounter: Payer: Self-pay | Admitting: Internal Medicine

## 2012-03-20 NOTE — Telephone Encounter (Signed)
Patient stated he is having his coumadin rechecked on Monday of next week.  I will send Boyd Kerbs, RN at [facility] an order for labs you want drawn.

## 2012-03-25 ENCOUNTER — Telehealth: Payer: Self-pay | Admitting: Internal Medicine

## 2012-03-25 NOTE — Telephone Encounter (Signed)
Received labs from [place] . Protime is fine,  And his testosterone level is slightly low,.  So continue q 3 wks injections.

## 2012-03-25 NOTE — Telephone Encounter (Signed)
Patient notified

## 2012-04-03 ENCOUNTER — Encounter: Payer: Self-pay | Admitting: Internal Medicine

## 2012-04-21 LAB — PROTIME-INR

## 2012-04-28 ENCOUNTER — Telehealth: Payer: Self-pay | Admitting: Internal Medicine

## 2012-04-28 DIAGNOSIS — Z7901 Long term (current) use of anticoagulants: Secondary | ICD-10-CM

## 2012-04-28 NOTE — Telephone Encounter (Signed)
Coumadin level from M<ay 13  is too high again. We need to start getting these through [facility].  Please order PT/INR for tommorow and monthly thereafter.  STOP coumadin immediately

## 2012-04-28 NOTE — Telephone Encounter (Signed)
Also called and left message with Yehuda Mao under patient's contacts.

## 2012-04-28 NOTE — Telephone Encounter (Signed)
Tried calling patient again, left another message for him to call me back.

## 2012-04-28 NOTE — Telephone Encounter (Signed)
Left message asking patient to call back

## 2012-04-28 NOTE — Telephone Encounter (Signed)
This is the patient with the elevated INR,. H e needs to stop his coumadin and repeat INR on  Thrusday

## 2012-04-28 NOTE — Telephone Encounter (Signed)
Patient notified as instructed by telephone. Pt said would have INR drawn Thursday at [facility].

## 2012-04-28 NOTE — Telephone Encounter (Signed)
Caller: Kevin [name]/Patient; PCP: Kevin [name]; CB#: [phone]; Returning Call To Kevin [name] Re: Blood Test Done Last Week; he missed call this morning but he will be home all afternoon if she would like to call back.

## 2012-04-28 NOTE — Telephone Encounter (Signed)
Kevin [name], can you please try to reach this patient in my absences. The order for [facility] has already been entered. Patient will just need to be told to go.  Thanks.

## 2012-04-29 ENCOUNTER — Ambulatory Visit: Payer: Self-pay | Admitting: Internal Medicine

## 2012-04-29 ENCOUNTER — Other Ambulatory Visit: Payer: Self-pay | Admitting: Internal Medicine

## 2012-04-30 ENCOUNTER — Ambulatory Visit: Payer: Self-pay | Admitting: Internal Medicine

## 2012-04-30 LAB — PROTIME-INR
INR: 2.6
Prothrombin Time: 28.3 secs — ABNORMAL HIGH (ref 11.5–14.7)

## 2012-05-01 ENCOUNTER — Other Ambulatory Visit: Payer: Self-pay | Admitting: Internal Medicine

## 2012-05-01 LAB — PROTIME-INR

## 2012-05-01 NOTE — Telephone Encounter (Signed)
Done

## 2012-05-02 ENCOUNTER — Telehealth: Payer: Self-pay | Admitting: Internal Medicine

## 2012-05-02 NOTE — Telephone Encounter (Signed)
Patient informed/SLS  

## 2012-05-02 NOTE — Telephone Encounter (Signed)
His coumadin  level is back down after suspending it for 2 days. He can resume 3 mg daily starting today and repeat a PT/INR in 2 weeks

## 2012-05-09 NOTE — Telephone Encounter (Signed)
Gave patient results & MD instructions via phone on 05.24.13 [see phone note/SLS]

## 2012-05-20 ENCOUNTER — Encounter: Payer: Self-pay | Admitting: Internal Medicine

## 2012-06-02 LAB — PULMONARY FUNCTION TEST

## 2012-06-03 ENCOUNTER — Encounter: Payer: Self-pay | Admitting: Internal Medicine

## 2012-06-04 ENCOUNTER — Ambulatory Visit: Payer: Self-pay | Admitting: Specialist

## 2012-06-04 LAB — CREATININE, SERUM
Creatinine: 1.08 mg/dL (ref 0.60–1.30)
EGFR (Non-African Amer.): >60

## 2012-06-10 ENCOUNTER — Telehealth: Payer: Self-pay | Admitting: *Deleted

## 2012-06-10 NOTE — Telephone Encounter (Signed)
Per Dr. Darrick Huntsman he is to hold coumadin and have pt/inr rechecked tomorrow. Patient has been notified. He is going to have it rechecked tomorrow at [facility] .

## 2012-06-10 NOTE — Telephone Encounter (Signed)
Boyd Kerbs called with critical lab. INR of 6.0 and PT of 61.0. Will also be giving verbal to Dr. Darrick Huntsman .

## 2012-06-11 LAB — PROTIME-INR

## 2012-06-13 ENCOUNTER — Encounter: Payer: Self-pay | Admitting: Internal Medicine

## 2012-06-13 ENCOUNTER — Telehealth: Payer: Self-pay | Admitting: Internal Medicine

## 2012-06-13 ENCOUNTER — Ambulatory Visit (INDEPENDENT_AMBULATORY_CARE_PROVIDER_SITE_OTHER): Payer: Medicare Other | Admitting: Internal Medicine

## 2012-06-13 VITALS — BP 129/54 | HR 63 | Temp 97.8°F | Resp 18 | Wt 154.5 lb

## 2012-06-13 DIAGNOSIS — D509 Iron deficiency anemia, unspecified: Secondary | ICD-10-CM

## 2012-06-13 DIAGNOSIS — I4892 Unspecified atrial flutter: Secondary | ICD-10-CM

## 2012-06-13 DIAGNOSIS — D500 Iron deficiency anemia secondary to blood loss (chronic): Secondary | ICD-10-CM

## 2012-06-13 DIAGNOSIS — Z7901 Long term (current) use of anticoagulants: Secondary | ICD-10-CM

## 2012-06-13 DIAGNOSIS — I4891 Unspecified atrial fibrillation: Secondary | ICD-10-CM

## 2012-06-13 MED ORDER — MELATONIN ER 3 MG PO TBCR: 2.0000 | EXTENDED_RELEASE_TABLET | Freq: Every day | ORAL | Status: DC

## 2012-06-13 MED ORDER — FERROUS SULFATE 324 (65 FE) MG PO TBEC: 1.0000 | DELAYED_RELEASE_TABLET | Freq: Every day | ORAL | Status: DC

## 2012-06-13 NOTE — Telephone Encounter (Signed)
Yes, can dispense 325 mg of ferrous sulfate.

## 2012-06-13 NOTE — Telephone Encounter (Signed)
Patient called and stated you wanted to know how much melatonin he is taking a night and he stated he is taking two 3 mg capsules.  He also wanted to let you know the nurse at the [facility] [place] could not get his blood either for the labs so he will go back on Monday.

## 2012-06-13 NOTE — Patient Instructions (Addendum)
Do not take any more coumadin until we tell you to resume.  We are rechecking it today.   Call us back with your melatonin dose.   We are starting you on an iron supplement because of your iron deficiency anemia.  You will need a GI evaluation because of the iron deficiency anemia.  I will send your EKG to Dr. Juliann Pares

## 2012-06-13 NOTE — Telephone Encounter (Signed)
Nedra Hai from [facility] is calling to clarify Rx written by Dr. Darrick Huntsman for Ferrous Sulfate 324/ Can she dispense 325 mgs to be taken 1 PO at lunchtime?

## 2012-06-13 NOTE — Progress Notes (Signed)
Patient ID: Kevin [name], male   DOB: 19-Nov-1925, 78 y.o.   MRN: [id number]  Patient Active Problem List  Diagnosis  . Encounter for long-term (current) use of anticoagulants  . Atrial fibrillation  . Chronic headaches  . History of mitral valve replacement with mechanical valve  . Sciatica  . Hypertension  . Bradycardia  . Neuropathy of lower extremity  . Low testosterone  . Depression  . Esophageal reflux  . Anemia due to blood loss    Subjective:  CC:   Chief Complaint  Patient presents with  . Follow-up    3 month    HPI:   Kevin [name] a 78 y.o. male who presents follow up on chronic conditions. His PT/ INR was high on Monday and he was instructed to suspend his coumadin and repeat an INR on Wednesday.  However he has resumed his coumadin and his repeat PT/INR is not available.  He does report stopping his amitriptyline and starting melatonin 6 mg at bedtime for management of insomnia. He did not notify me of this change despite prior episodes of supra therapeutic INR due to patient initiated medication changes .  He does report improved sleep since making th change.  He denies bleeding.    2nd issue is new onset exercise intolerance for the past several months.  Has had a cardiologic and pulmonary workup by [facility] with TEE and [facility] with PFT'S and chest CT which were unrevealing for pulmonary hypertension and systolic dysfunction.  He saw his DO Dr. Isabell Jarvis who found iron deficiency anemia and atrial flutter. HGB 10.3  FERRITIN 26.  Last colonoscopy was 6 to 8 years ago before his move from [place] to [place]. He has noted frequent black tarry stools but he presumed it was from his vast assortment of vitamin supplements. Has had some gastritis which he treats with acid reflux pills   Past Medical History  Diagnosis Date  . Atrial fibrillation   . Sciatica   . Chronic headaches started age 19  . History of mitral valve replacement with mechanical valve   . Sciatica   .  Osteoporosis     secondary to low testoerone  . Cancer     melanoma- right ear    Past Surgical History  Procedure Date  . Nasal septum surgery 1963  . Cataract extraction, bilateral     x 2  . Melanoma excision     right ear  . Mitral valve replacement 1994  . Hernia repair     x 2         The following portions of the patient's history were reviewed and updated as appropriate: Allergies, current medications, and problem list.    Review of Systems:   12 Pt  review of systems was negative except those addressed in the HPI,     History   Social History  . Marital Status: Widowed    Spouse Name: N/A    Number of Children: N/A  . Years of Education: N/A   Occupational History  . Not on file.   Social History Main Topics  . Smoking status: Never Smoker   . Smokeless tobacco: Never Used  . Alcohol Use: No  . Drug Use: No  . Sexually Active: Not on file   Other Topics Concern  . Not on file   Social History Narrative   Exercise; light 3 x weekLives alone, widowed at [employer] [facility] [employer]    Objective:  BP 129/54  Pulse 63  Temp 97.8 F (36.6 C) (Oral)  Resp 18  Wt 154 lb 8 oz (70.081 kg)  SpO2 94%  General appearance: alert, cooperative and appears stated age Neck: no adenopathy, no carotid bruit, supple, symmetrical, trachea midline and thyroid not enlarged, symmetric, no tenderness/mass/nodules Back: symmetric, no curvature. ROM normal. No CVA tenderness. Lungs: clear to auscultation bilaterally Heart: regular rate and rhythm, S1, S2 normal, no murmur, click, rub or gallop Abdomen: soft, non-tender; bowel sounds normal; no masses,  no organomegaly Pulses: 2+ and symmetric Skin: Skin color, texture, turgor normal. No rashes or lesions Lymph nodes: Cervical, supraclavicular, and axillary nodes normal.  Assessment and Plan:  Atrial fibrillation currently in atrial flutter by today's EKG, which is rate controlled but may be aggravating his exercise  intolerance without desaturations.  Will have him follow up with [facility] for consideration of cardioversion.   Encounter for long-term (current) use of anticoagulants Patient takes coumadin for goal INR 2.5 to 3.5 for history of artificial valve.  Recent his INR was 6.0 due to patient initiated medication changes.  Repeat INr is pending due to technical problems with blood draw, and will be drawn on Monday,  Coumadin has been suspended until then  Anemia due to blood loss Suspected GI losses,  Given iron deficiency by recent labs, use of coumadin, and patient report of black tarry stools.  He will need referral for EGD/colonoscopy to determine source of bleed .  Iron supplement prescribed today starting with once daily use.    Updated Medication List Outpatient Encounter Prescriptions as of 06/13/2012  Medication Sig Dispense Refill  . brimonidine (ALPHAGAN) 0.15 % ophthalmic solution       . Cholecalciferol (VITAMIN D) 2000 UNITS CAPS Take by mouth 2 (two) times daily.        . L-GLUTAMINE PO Take by mouth.        . latanoprost (XALATAN) 0.005 % ophthalmic solution       . losartan (COZAAR) 100 MG tablet TAKE ONE TABLET BY MOUTH DAILY  90 tablet  0  . Multiple Vitamin (MULTIVITAMIN) tablet Take 1 tablet by mouth daily.        Marland Kitchen NIACINAMIDE PO Take by mouth.        . Nutritional Supplements (MELATONIN PO) Take by mouth.      . Nutritional Supplements (PYCNOGENOL) 300-30 MG CAPS Take 1 capsule by mouth daily.        . Omega-3 Fatty Acids (OMEGA 3 PO) Take by mouth.        Marland Kitchen omeprazole (PRILOSEC) 10 MG capsule Take 10 mg by mouth daily.        [name] Calcium 500 MG TABS Take two by mouth daily       . Probiotic Product (PROBIOTIC FORMULA PO) Take by mouth.        . Red Yeast Rice Extract (RED YEAST RICE PO) Take by mouth.        . SYRINGE-NEEDLE, DISP, 3 ML (SAFETY-LOK 3CC SYR 22GX1.5") 22G X 1-1/2" 3 ML MISC 2 ml's monthly-given 1 shot monthly       . testosterone cypionate  (DEPOTESTOTERONE CYPIONATE) 200 MG/ML injection Inject 0.5 mLs (100 mg total) into the muscle every 21 ( twenty-one) days.  10 mL  1  . TURMERIC PO Take by mouth.        . warfarin (COUMADIN) 3 MG tablet       . DISCONTD: warfarin (COUMADIN) 3 MG tablet TAKE 2 TABLETS BY MOUTH DAILY  60 tablet  3  . ferrous sulfate 324 (65 FE) MG TBEC Take 1 tablet (325 mg total) by mouth daily with lunch.  30 tablet  3  . DISCONTD: amLODipine (NORVASC) 10 MG tablet Take 10 mg by mouth daily.        Marland Kitchen DISCONTD: warfarin (COUMADIN) 3 MG tablet          Orders Placed This Encounter  Procedures  . Iron and TIBC  . Protime-INR  . Guiac Stool Card-TAKE HOME  . Erythropoietin  . Ambulatory referral to Gastroenterology  . Ambulatory referral to Cardiology  . EKG 12-Lead    Return in about 1 month (around 07/14/2012).

## 2012-06-14 ENCOUNTER — Encounter: Payer: Self-pay | Admitting: Internal Medicine

## 2012-06-14 DIAGNOSIS — D500 Iron deficiency anemia secondary to blood loss (chronic): Secondary | ICD-10-CM | POA: Insufficient documentation

## 2012-06-14 DIAGNOSIS — D62 Acute posthemorrhagic anemia: Secondary | ICD-10-CM | POA: Insufficient documentation

## 2012-06-14 NOTE — Assessment & Plan Note (Signed)
Suspected GI losses,  Given iron deficiency by recent labs, use of coumadin, and patient report of black tarry stools.  He will need referral for EGD/colonoscopy to determine source of bleed .  Iron supplement prescribed today starting with once daily use.

## 2012-06-14 NOTE — Assessment & Plan Note (Signed)
currently in atrial flutter by today's EKG, which is rate controlled but may be aggravating his exercise intolerance without desaturations.  Will have him follow up with [facility] for consideration of cardioversion.

## 2012-06-14 NOTE — Assessment & Plan Note (Addendum)
Patient takes coumadin for goal INR 2.5 to 3.5 for history of artificial valve.  Recent his INR was 6.0 due to patient initiated medication changes.  Repeat INr is pending due to technical problems with blood draw, and will be drawn on Monday,  Coumadin has been suspended until then

## 2012-06-17 ENCOUNTER — Telehealth: Payer: Self-pay | Admitting: Internal Medicine

## 2012-06-17 DIAGNOSIS — Z7901 Long term (current) use of anticoagulants: Secondary | ICD-10-CM

## 2012-06-17 MED ORDER — WARFARIN SODIUM 2 MG PO TABS: 2.0000 mg | ORAL_TABLET | Freq: Every day | ORAL | Status: DC

## 2012-06-17 NOTE — Telephone Encounter (Signed)
HIs INR from [facility] on July 3rd was still elevated at 5.0 He was supposed to get it repeat this past Monday.  I have not received it yet, and he has been off of his coumadin for nearly a week now,   Can you call over there to see  It if was done?

## 2012-06-17 NOTE — Assessment & Plan Note (Signed)
Repeat INr was 2.2 on Jly 8 after suspednign coumadin for 4 days .  Will resume at 2 mg daily for gaol INR 2.5 to 3.5 . Repeat INR in one week

## 2012-06-17 NOTE — Telephone Encounter (Signed)
Disregard the previous message about follow up INR.  I have it. Is INR as of July 8 as now 2.2 .  I am changing his regimen to 2 mg daily  And he will need a repeat INR in 1 week .  Please ask Melody Comas to see if there is a way we can get [facility] to submit results electronically.  This patient gets INR drawn by Boyd Kerbs at [place], which may be the problem.

## 2012-06-18 ENCOUNTER — Telehealth: Payer: Self-pay | Admitting: Internal Medicine

## 2012-06-18 MED ORDER — WARFARIN SODIUM 2 MG PO TABS: 2.0000 mg | ORAL_TABLET | Freq: Every day | ORAL | Status: DC

## 2012-06-18 NOTE — Telephone Encounter (Signed)
Left message asking patient to return my call.

## 2012-06-18 NOTE — Telephone Encounter (Signed)
Patient was notified by Call a Nurse.  Rx has been called in.

## 2012-06-18 NOTE — Telephone Encounter (Signed)
Caller: Tivis/Patient; PCP: Duncan Dull; CB#: [phone];  Call regarding Missed Call From Nurse Earlier Regarding His Coumadin Dosage and PT/INR Levels.; Verified in EPIC that RN had left message for pt to call and that INR results were 2.2. Verified Dr. Melina Schools advice to administer Coumadin 2MG  Daily and recheck INR levels in 1 week. Pt agreed. PT states that he also wants Dr. Darrick Huntsman to be aware that he went to Dr. Nydia Bouton (per her referral) and he is sending pt to another MD in [place] (pt does not know name) for a second opinion. States that his Hgb was low and wants to know if Dr. Darrick Huntsman thinks that could be causing his SOB that they discussed at his last visit. Pt only has 3MG  and 5MG  Coumadin tabs on hand. Does not have any 2MG  tabs. NEEDS RX! Has not taken Coumadin in 1 week!

## 2012-06-20 ENCOUNTER — Other Ambulatory Visit: Payer: Self-pay | Admitting: Internal Medicine

## 2012-06-20 DIAGNOSIS — Z1211 Encounter for screening for malignant neoplasm of colon: Secondary | ICD-10-CM

## 2012-06-23 ENCOUNTER — Other Ambulatory Visit: Payer: Medicare Other

## 2012-06-23 DIAGNOSIS — Z1211 Encounter for screening for malignant neoplasm of colon: Secondary | ICD-10-CM

## 2012-06-25 ENCOUNTER — Encounter: Payer: Self-pay | Admitting: Cardiovascular Disease

## 2012-06-25 ENCOUNTER — Encounter: Payer: Self-pay | Admitting: *Deleted

## 2012-06-26 ENCOUNTER — Encounter: Payer: Self-pay | Admitting: Internal Medicine

## 2012-06-26 ENCOUNTER — Encounter: Payer: Self-pay | Admitting: Cardiovascular Disease

## 2012-06-26 ENCOUNTER — Ambulatory Visit (INDEPENDENT_AMBULATORY_CARE_PROVIDER_SITE_OTHER): Payer: Medicare Other | Admitting: Cardiovascular Disease

## 2012-06-26 VITALS — BP 150/62 | HR 76 | Ht 67.0 in | Wt 155.0 lb

## 2012-06-26 DIAGNOSIS — I4892 Unspecified atrial flutter: Secondary | ICD-10-CM

## 2012-06-26 DIAGNOSIS — D500 Iron deficiency anemia secondary to blood loss (chronic): Secondary | ICD-10-CM

## 2012-06-26 DIAGNOSIS — Z954 Presence of other heart-valve replacement: Secondary | ICD-10-CM

## 2012-06-26 DIAGNOSIS — R0602 Shortness of breath: Secondary | ICD-10-CM

## 2012-06-26 DIAGNOSIS — I10 Essential (primary) hypertension: Secondary | ICD-10-CM

## 2012-06-26 DIAGNOSIS — Z952 Presence of prosthetic heart valve: Secondary | ICD-10-CM

## 2012-06-26 NOTE — Assessment & Plan Note (Signed)
Anemia may be contributing to some of his shortness of breath. He reports being recently started on iron.

## 2012-06-26 NOTE — Patient Instructions (Addendum)
You are doing well. No medication changes were made.  We will schedule you for a cardioversion at [facility] for your atrial flutter  Please call us if you have new issues that need to be addressed before your next appt.

## 2012-06-26 NOTE — Assessment & Plan Note (Signed)
Recent transthoracic echo and TEE showing [employer] mitral valve is functioning appropriately

## 2012-06-26 NOTE — Assessment & Plan Note (Signed)
Blood pressure is well controlled on today's visit. No changes made to the medications. 

## 2012-06-26 NOTE — Progress Notes (Signed)
   Patient ID: Kevin [name], male    DOB: February 22, 1925, 78 y.o.   MRN: [id number]  HPI Comments: Kevin [name] is a very pleasant 78 year old gentleman, patient of Dr. Juliann [name] and Dr. Meredeth [name], with a history of mitral valve are placement with St. Jude valve in 1994, history of atrial fibrillation, mild restriction on pulmonary function tests who presents for second opinion regarding worsening shortness of breath over the past 2 months.  He reports having relatively acute onset of worsening shortness of breath. He used to be able to walk up to a mile per day and now reports that he cannot walk anywhere near that distance. He has no smoking history. He denies any cough, large from edema. He has had significant workup over the past several months including echocardiogram that showed ejection fraction 50% with mildly elevated right ventricular systolic pressures, mild MR and TR.  CT scan did not show any PE Chest x-ray showed prominent pulmonary arteries otherwise no prominent new findings He had a transesophageal echo 04/30/2012 that was essentially normal documenting normal functioning mitral valve  He reports having poor energy recently Lab work shows hematocrit 32 and he was started on iron by Dr. Darrick [name]. TSH is normal  EKGs were reviewed with atrial flutter seen in 2008, Atrial flutter with slow ventricular response rate of 50 seen 06/13/2012 Atrial flutter with slow ventricular response rate 47 seen June 03 2012 EKG today again shows atrial flutter     Review of Systems  Constitutional: Negative.   HENT: Negative.   Eyes: Negative.   Respiratory: Positive for shortness of breath.   Cardiovascular: Negative.   Gastrointestinal: Negative.   Musculoskeletal: Negative.   Skin: Negative.   Neurological: Negative.   Hematological: Negative.   Psychiatric/Behavioral: Negative.   All other systems reviewed and are negative.      Physical Exam  Nursing note and vitals  reviewed. Constitutional: He is oriented to person, place, and time. He appears well-developed and well-nourished.  HENT:  Head: Normocephalic.  Nose: Nose normal.  Mouth/Throat: Oropharynx is clear and moist.  Eyes: Conjunctivae are normal. Pupils are equal, round, and reactive to light.  Neck: Normal range of motion. Neck supple. No JVD present.  Cardiovascular: Regular rhythm, S1 normal, S2 normal, normal heart sounds and intact distal pulses.  Bradycardia present.  Exam reveals no gallop and no friction rub.   No murmur heard. Pulmonary/Chest: Effort normal and breath sounds normal. No respiratory distress. He has no wheezes. He has no rales. He exhibits no tenderness.  Abdominal: Soft. Bowel sounds are normal. He exhibits no distension. There is no tenderness.  Musculoskeletal: Normal range of motion. He exhibits no edema and no tenderness.  Lymphadenopathy:    He has no cervical adenopathy.  Neurological: He is alert and oriented to person, place, and time. Coordination normal.  Skin: Skin is warm and dry. No rash noted. No erythema.  Psychiatric: He has a normal mood and affect. His behavior is normal. Judgment and thought content normal.           Assessment and Plan

## 2012-06-26 NOTE — Assessment & Plan Note (Addendum)
He appears to have been in atrial flutter since the end of June 2013. Old EKG from 2008 also showed atrial flutter. No EKGs available from 2008 to June 2013 at this time. Notes from Dr. Juliann Pares indicate he has atrial fibrillation. Uncertain if he is persistent atrial flutter or this is new onset in the past several months.  Of note is the slow ventricular rhythm, rates in the 40s to 50s at times, while in atrial flutter. This could certainly be contributing to his shortness of breath. He may have possible chronotropic incompetence secondary to underlying atrial flutter, with slow rate response with exertion.  We have discussed the various treatment options with him and have suggested attempted cardioversion. This will be scheduled for next week at [facility] . We will not start amiodarone prior to the cardioversion as I am concerned this could slow his ventricular rate even more. If it is unsuccessful, we would consider possibly trying amiodarone for repeat cardioversion versus atrial flutter ablation. Uncertain if he has underlying sick sinus syndrome, given his slow ventricular response.

## 2012-06-30 ENCOUNTER — Encounter: Payer: Self-pay | Admitting: Internal Medicine

## 2012-06-30 ENCOUNTER — Ambulatory Visit: Payer: Self-pay | Admitting: Cardiovascular Disease

## 2012-06-30 DIAGNOSIS — I4892 Unspecified atrial flutter: Secondary | ICD-10-CM

## 2012-06-30 LAB — LAB REPORT - SCANNED
INR: 1.3
INR: 2.2

## 2012-07-01 ENCOUNTER — Telehealth: Payer: Self-pay

## 2012-07-01 ENCOUNTER — Encounter: Payer: Self-pay | Admitting: Internal Medicine

## 2012-07-01 NOTE — Telephone Encounter (Signed)
Pt called to ask if we would fax report of cardioversion done yesterday to Dr. Harrie Foreman at [facility] at [phone] Will fax

## 2012-07-01 NOTE — Telephone Encounter (Signed)
Spoke with pt Denies symptoms  Confirms f/u appt Monday 07/07/12 Confirms he has all meds he needs since d/c

## 2012-07-01 NOTE — Telephone Encounter (Signed)
TCM call  S/p cardioversion

## 2012-07-03 ENCOUNTER — Telehealth: Payer: Self-pay | Admitting: Internal Medicine

## 2012-07-03 NOTE — Telephone Encounter (Signed)
I received his his memo about the new supplements that Dr. Harrie Foreman sold him>  I would prefer that He not start anything new until we get his Coumadin level up in the therapeutic given his recent cardioversion. He is supposed to be getting a repeat INR on Monday at [facility]. As he has in order been sent over there for that.

## 2012-07-04 NOTE — Telephone Encounter (Signed)
Left detailed message notifying patient.

## 2012-07-07 ENCOUNTER — Encounter: Payer: Self-pay | Admitting: Cardiovascular Disease

## 2012-07-07 ENCOUNTER — Ambulatory Visit (INDEPENDENT_AMBULATORY_CARE_PROVIDER_SITE_OTHER): Payer: Medicare Other | Admitting: Cardiovascular Disease

## 2012-07-07 VITALS — BP 162/55 | HR 89 | Ht 67.0 in | Wt 150.5 lb

## 2012-07-07 DIAGNOSIS — I10 Essential (primary) hypertension: Secondary | ICD-10-CM

## 2012-07-07 DIAGNOSIS — Z954 Presence of other heart-valve replacement: Secondary | ICD-10-CM

## 2012-07-07 DIAGNOSIS — I4892 Unspecified atrial flutter: Secondary | ICD-10-CM

## 2012-07-07 DIAGNOSIS — I498 Other specified cardiac arrhythmias: Secondary | ICD-10-CM

## 2012-07-07 DIAGNOSIS — Z952 Presence of prosthetic heart valve: Secondary | ICD-10-CM

## 2012-07-07 DIAGNOSIS — R001 Bradycardia, unspecified: Secondary | ICD-10-CM

## 2012-07-07 DIAGNOSIS — I499 Cardiac arrhythmia, unspecified: Secondary | ICD-10-CM

## 2012-07-07 MED ORDER — METOPROLOL TARTRATE 25 MG PO TABS: 25.0000 mg | ORAL_TABLET | Freq: Two times a day (BID) | ORAL | Status: DC

## 2012-07-07 NOTE — Assessment & Plan Note (Addendum)
Cardioversion last week. He is holding normal sinus rhythm. Will start him on metoprolol 25 mg twice a day to hold his rhythm. If he develops shortness of breath again, I have suggested he call our office or Dr. Juliann Pares.

## 2012-07-07 NOTE — Assessment & Plan Note (Signed)
Will add metoprolol 25 mg BID.

## 2012-07-07 NOTE — Assessment & Plan Note (Signed)
Heart rate currently in the 80s. If he has bradycardia and maintains normal sinus rhythm on metoprolol, we would decrease the dose to 12.5 mg twice a day.

## 2012-07-07 NOTE — Patient Instructions (Addendum)
You are doing well. Please hold RED YEAST RICE for 1 to 2 weeks to see if leg cramps get better  Please start metoprolol twice a day  Please call us if you have new issues that need to be addressed before your next appt.  Your physician wants you to follow-up in: 3 months.

## 2012-07-07 NOTE — Progress Notes (Signed)
Patient ID: Kevin [name], male    DOB: 1925/06/01, 78 y.o.   MRN: [id number]  HPI Comments: [name] is a very pleasant 78 year old gentleman, patient of Dr. Juliann Pares, with a history of mitral valve replacement with St. Jude valve in 1994, history of atrial fibrillation, mild restriction on pulmonary function tests who presented initially with worsening shortness of breath over the past 2 months.  He was found to be in atrial flutter. As he was on warfarin, we proceeded with cardioversion last week and obtained normal sinus rhythm with one shock. In followup today he reports his shortness of breath has resolved and his energy is better.  He does have cramping in his right thigh for the past 6 weeks He is scheduled to have his warfarin/INR checked in 2 days. He does continue to have problems with dizziness, particularly if he bends over and stands straight up, sometimes even at rest without movement.  Previous echocardiogram that showed ejection fraction 50% with mildly elevated right ventricular systolic pressures, mild MR and TR.  Previous  CT scan did not show any PE Chest x-ray showed prominent pulmonary arteries otherwise no prominent new findings He had a transesophageal echo 04/30/2012 that was essentially normal documenting normal functioning mitral valve  He reports having poor energy recently Lab work shows hematocrit 32 and he was started on iron by Dr. Darrick Huntsman. TSH is normal  EKGs were reviewed with atrial flutter seen in 2008, Atrial flutter with slow ventricular response rate of 50 seen 06/13/2012 Atrial flutter with slow ventricular response rate 47 seen June 03 2012 EKG today shows normal sinus rhythm with rate 81 beats per minute, ST and T wave abnormality in lead 3 and aVF   Outpatient Encounter Prescriptions as of 07/07/2012  Medication Sig Dispense Refill  . amLODipine (NORVASC) 10 MG tablet Take 10 mg by mouth daily.      . brimonidine (ALPHAGAN) 0.15 % ophthalmic  solution       . Cholecalciferol (VITAMIN D) 2000 UNITS CAPS Take by mouth 2 (two) times daily.        . ferrous sulfate 324 (65 FE) MG TBEC Take 1 tablet (325 mg total) by mouth daily with lunch.  30 tablet  3  . L-GLUTAMINE PO Take by mouth.        . latanoprost (XALATAN) 0.005 % ophthalmic solution       . losartan (COZAAR) 100 MG tablet TAKE ONE TABLET BY MOUTH DAILY  90 tablet  0  . Melatonin CR 3 MG TBCR Take 2 tablets by mouth at bedtime.  120 each  3  . Multiple Vitamin (MULTIVITAMIN) tablet Take 1 tablet by mouth daily.        Marland Kitchen NIACINAMIDE PO Take by mouth.        . Nutritional Supplements (MELATONIN PO) Take by mouth.      . Nutritional Supplements (PYCNOGENOL) 300-30 MG CAPS Take 1 capsule by mouth daily.        . Omega-3 Fatty Acids (OMEGA 3 PO) Take by mouth.        Marland Kitchen omeprazole (PRILOSEC) 10 MG capsule Take 10 mg by mouth daily.        [name] Calcium 500 MG TABS Take two by mouth daily       . Probiotic Product (PROBIOTIC FORMULA PO) Take by mouth.        . Red Yeast Rice Extract (RED YEAST RICE PO) Take by mouth.        Marland Kitchen  SYRINGE-NEEDLE, DISP, 3 ML (SAFETY-LOK 3CC SYR 22GX1.5") 22G X 1-1/2" 3 ML MISC 2 ml's monthly-given 1 shot monthly       . testosterone cypionate (DEPOTESTOTERONE CYPIONATE) 200 MG/ML injection Inject 0.5 mLs (100 mg total) into the muscle every 21 ( twenty-one) days.  10 mL  1  . TURMERIC PO Take by mouth.        . warfarin (COUMADIN) 2 MG tablet Take 1 tablet (2 mg total) by mouth daily.  30 tablet  3    Review of Systems  Constitutional: Negative.   HENT: Negative.   Eyes: Negative.   Cardiovascular: Negative.   Gastrointestinal: Negative.   Musculoskeletal: Positive for myalgias.  Skin: Negative.   Neurological: Negative.   Hematological: Negative.   Psychiatric/Behavioral: Negative.   All other systems reviewed and are negative.    BP 162/55  Pulse 89  Ht 5\' 7"  (1.702 m)  Wt 150 lb 8 oz (68.266 kg)  BMI 23.57 kg/m2  Physical  Exam  Nursing note and vitals reviewed. Constitutional: He is oriented to person, place, and time. He appears well-developed and well-nourished.  HENT:  Head: Normocephalic.  Nose: Nose normal.  Mouth/Throat: Oropharynx is clear and moist.  Eyes: Conjunctivae are normal. Pupils are equal, round, and reactive to light.  Neck: Normal range of motion. Neck supple. No JVD present.  Cardiovascular: Regular rhythm, S1 normal, S2 normal, normal heart sounds and intact distal pulses.  Bradycardia present.  Exam reveals no gallop and no friction rub.   No murmur heard. Pulmonary/Chest: Effort normal and breath sounds normal. No respiratory distress. He has no wheezes. He has no rales. He exhibits no tenderness.  Abdominal: Soft. Bowel sounds are normal. He exhibits no distension. There is no tenderness.  Musculoskeletal: Normal range of motion. He exhibits no edema and no tenderness.  Lymphadenopathy:    He has no cervical adenopathy.  Neurological: He is alert and oriented to person, place, and time. Coordination normal.  Skin: Skin is warm and dry. No rash noted. No erythema.  Psychiatric: He has a normal mood and affect. His behavior is normal. Judgment and thought content normal.           Assessment and Plan

## 2012-07-07 NOTE — Assessment & Plan Note (Signed)
Previous INR slightly subtherapeutic. Recheck this week.

## 2012-07-11 ENCOUNTER — Telehealth: Payer: Self-pay | Admitting: Internal Medicine

## 2012-07-11 NOTE — Telephone Encounter (Signed)
Patient advised as instructed via telephone. 

## 2012-07-11 NOTE — Telephone Encounter (Signed)
please have him take 3 mg daily and repeat PT/INR in 2 weeks

## 2012-07-11 NOTE — Telephone Encounter (Signed)
His Coumadin level on Wednesday was 2.1. Please find out how much Coumadin he has been taking because this is a little bit low. A

## 2012-07-11 NOTE — Telephone Encounter (Signed)
Patient notified. He has been alternating 2 mg and 3 mg every other day.

## 2012-07-21 ENCOUNTER — Other Ambulatory Visit: Payer: Self-pay | Admitting: *Deleted

## 2012-07-21 MED ORDER — AMLODIPINE BESYLATE 10 MG PO TABS: 10.0000 mg | ORAL_TABLET | Freq: Every day | ORAL | Status: DC

## 2012-07-24 ENCOUNTER — Encounter: Payer: Self-pay | Admitting: Internal Medicine

## 2012-07-29 ENCOUNTER — Other Ambulatory Visit: Payer: Self-pay | Admitting: Internal Medicine

## 2012-08-07 ENCOUNTER — Telehealth: Payer: Self-pay | Admitting: Internal Medicine

## 2012-08-07 NOTE — Telephone Encounter (Signed)
His INR is fine.  Repeat in one month unless he changes his supplements again, then sooner

## 2012-08-07 NOTE — Telephone Encounter (Signed)
Left message on home number advising patient

## 2012-08-19 ENCOUNTER — Encounter: Payer: Self-pay | Admitting: Internal Medicine

## 2012-08-26 LAB — PROTIME-INR

## 2012-08-27 ENCOUNTER — Telehealth: Payer: Self-pay | Admitting: Internal Medicine

## 2012-08-27 NOTE — Telephone Encounter (Signed)
Coumadin level is therapeutic,  Continue current regimen and repeat PT/INR in one month 

## 2012-08-27 NOTE — Telephone Encounter (Signed)
Patient notified.  He is currently taking 3 mg daily.

## 2012-09-15 ENCOUNTER — Encounter: Payer: Self-pay | Admitting: Internal Medicine

## 2012-09-15 ENCOUNTER — Ambulatory Visit (INDEPENDENT_AMBULATORY_CARE_PROVIDER_SITE_OTHER): Payer: Medicare Other | Admitting: Internal Medicine

## 2012-09-15 VITALS — BP 118/62 | HR 55 | Temp 97.8°F | Ht 65.5 in | Wt 153.2 lb

## 2012-09-15 DIAGNOSIS — M810 Age-related osteoporosis without current pathological fracture: Secondary | ICD-10-CM | POA: Insufficient documentation

## 2012-09-15 DIAGNOSIS — Z7901 Long term (current) use of anticoagulants: Secondary | ICD-10-CM

## 2012-09-15 DIAGNOSIS — G8929 Other chronic pain: Secondary | ICD-10-CM

## 2012-09-15 DIAGNOSIS — M25559 Pain in unspecified hip: Secondary | ICD-10-CM

## 2012-09-15 DIAGNOSIS — M549 Dorsalgia, unspecified: Secondary | ICD-10-CM

## 2012-09-15 DIAGNOSIS — M79609 Pain in unspecified limb: Secondary | ICD-10-CM

## 2012-09-15 DIAGNOSIS — D509 Iron deficiency anemia, unspecified: Secondary | ICD-10-CM

## 2012-09-15 DIAGNOSIS — M79604 Pain in right leg: Secondary | ICD-10-CM | POA: Insufficient documentation

## 2012-09-15 NOTE — Assessment & Plan Note (Signed)
Hgb 10.3 in July, negative FOB.  No further workup given his age and chronic anticoagulation. Continue iron and repeat CBC .

## 2012-09-15 NOTE — Assessment & Plan Note (Signed)
He has osteoporosis and chronic low back pain complicated by scoliosis .  Plain films of leg and lumbar spine ordered

## 2012-09-15 NOTE — Assessment & Plan Note (Signed)
He has again started taking a new herbal supplement without notifying me or having his PT/INR repeated.

## 2012-09-15 NOTE — Patient Instructions (Addendum)
Please don't start any new herbal medications without letting me know ,  They can affect your coumadin level.  Get your coumadin level nd your iron /CBC checked ASAP\  Plain films of hip and lower spine have been ordered;  You can get these done at [facility]

## 2012-09-15 NOTE — Assessment & Plan Note (Signed)
By prior DE XA   T score -3.7  In 208.  Has been taking testosterone injection since then for improvement.  Treat due

## 2012-09-15 NOTE — Progress Notes (Signed)
Patient ID: Kevin [name], male   DOB: 07-07-1925, 78 y.o.   MRN: [id number]  Patient Active Problem List  Diagnosis  . Encounter for long-term (current) use of anticoagulants  . Atrial fibrillation  . Chronic headaches  . History of mitral valve replacement with mechanical valve  . Sciatica  . Hypertension  . Bradycardia  . Neuropathy of lower extremity  . Low testosterone  . Depression  . Esophageal reflux  . Anemia due to blood loss  . Atrial flutter  . Anemia, iron deficiency    Subjective:  CC:   Chief Complaint  Patient presents with  . Follow-up    HPI:   Kevin [name] a 78 y.o. male who presents for follow up.  In May had some exertional dyspnea,  Was seen by pulmonary and cardiology, sent to [facility],  Had cardioablation which resolved issue.  . Had colonoscopy for IDA which was reportedly normal.  Chief cc is fatigue secondary to pain in right thigh.  His DO has manipulated him without improvement. No lumbar spine films,   Back hurts when he makes his bed. Takes  Tylenol rarely .  Thigh hurts 5/10 after walking one block or standing for more than 5 minutes.  Has a lift in left shoe of 1/2 inch two months ago bc of the scoliosis .  No improvement  .  Was swimming in the pool but has stopped that one month ago. No priro films of either back or right hip. .    Past Medical History  Diagnosis Date  . Atrial fibrillation   . Sciatica   . Chronic headaches started age 75  . History of mitral valve replacement with mechanical valve   . Sciatica   . Osteoporosis     secondary to low testoerone  . Cancer     melanoma- right ear  . GERD (gastroesophageal reflux disease)   . Glaucoma   . Hypertension     Past Surgical History  Procedure Date  . Nasal septum surgery 1963  . Cataract extraction, bilateral     x 2  . Melanoma excision     right ear  . Mitral valve replacement 1994  . Hernia repair     x 2  . Cardioversion 2013    The following portions of the  patient's history were reviewed and updated as appropriate: Allergies, current medications, and problem list.    Review of Systems:   12 Pt  review of systems was negative except those addressed in the HPI,     History   Social History  . Marital Status: Widowed    Spouse Name: N/A    Number of Children: N/A  . Years of Education: N/A   Occupational History  . Not on file.   Social History Main Topics  . Smoking status: Never Smoker   . Smokeless tobacco: Never Used  . Alcohol Use: No  . Drug Use: No  . Sexually Active: Not on file   Other Topics Concern  . Not on file   Social History Narrative   Exercise; light 3 x weekLives alone, [facility]    Objective:  BP 118/62  Pulse 55  Temp 97.8 F (36.6 C) (Oral)  Ht 5' 5.5" (1.664 m)  Wt 153 lb 4 oz (69.514 kg)  BMI 25.11 kg/m2  SpO2 96%  General appearance: alert, cooperative and appears stated age Ears: normal TM's and external ear canals both ears Throat: lips, mucosa, and tongue  normal; teeth and gums normal Neck: no adenopathy, no carotid bruit, supple, symmetrical, trachea midline and thyroid not enlarged, symmetric, no tenderness/mass/nodules Back: symmetric, no curvature. ROM normal. No CVA tenderness. Lungs: clear to auscultation bilaterally Heart: regular rate and rhythm, S1, S2 normal, no murmur, click, rub or gallop Abdomen: soft, non-tender; bowel sounds normal; no masses,  no organomegaly Pulses: 2+ and symmetric Skin: Skin color, texture, turgor normal. No rashes or lesions Lymph nodes: Cervical, supraclavicular, and axillary nodes normal.  Assessment and Plan: Long term (current) use of anticoagulants He has again started taking a new herbal supplement without notifying me or having his PT/INR repeated.    Leg pain, right He has osteoporosis and chronic low back pain complicated by scoliosis .  Plain films of leg and lumbar spine ordered   Anemia, iron deficiency Hgb 10.3 in  July, negative FOB.  No further workup given his age and chronic anticoagulation. Continue iron and repeat CBC .   Osteoporosis, senile By prior DE XA   T score -3.7  In 208.  Has been taking testosterone injection since then for improvement.  Treat due    Updated Medication List Outpatient Encounter Prescriptions as of 09/15/2012  Medication Sig Dispense Refill  . amLODipine (NORVASC) 10 MG tablet Take 1 tablet (10 mg total) by mouth daily.  30 tablet  6  . brimonidine (ALPHAGAN) 0.15 % ophthalmic solution       . Cholecalciferol (VITAMIN D) 2000 UNITS CAPS Take by mouth 2 (two) times daily.        . ferrous sulfate 324 (65 FE) MG TBEC Take 1 tablet (325 mg total) by mouth daily with lunch.  30 tablet  3  . L-GLUTAMINE PO Take by mouth.        . latanoprost (XALATAN) 0.005 % ophthalmic solution       . losartan (COZAAR) 100 MG tablet TAKE ONE TABLET BY MOUTH DAILY  90 tablet  0  . Melatonin CR 3 MG TBCR Take 2 tablets by mouth at bedtime.  120 each  3  . metoprolol tartrate (LOPRESSOR) 25 MG tablet Take 1 tablet (25 mg total) by mouth 2 (two) times daily.  60 tablet  6  . Nutritional Supplements (MELATONIN PO) Take by mouth.      . Nutritional Supplements (PYCNOGENOL) 300-30 MG CAPS Take 1 capsule by mouth daily.        . Omega-3 Fatty Acids (OMEGA 3 PO) Take by mouth.        [name] Calcium 500 MG TABS Take two by mouth daily       . Red Yeast Rice Extract (RED YEAST RICE PO) Take by mouth.        . SYRINGE-NEEDLE, DISP, 3 ML (SAFETY-LOK 3CC SYR 22GX1.5") 22G X 1-1/2" 3 ML MISC 2 ml's monthly-given 1 shot monthly       . testosterone cypionate (DEPOTESTOTERONE CYPIONATE) 200 MG/ML injection Inject 0.5 mLs (100 mg total) into the muscle every 21 ( twenty-one) days.  10 mL  1  . TURMERIC PO Take by mouth.        . warfarin (COUMADIN) 2 MG tablet Take 3 mg daily.      . Multiple Vitamin (MULTIVITAMIN) tablet Take 1 tablet by mouth daily.        Marland Kitchen NIACINAMIDE PO Take by mouth.          Marland Kitchen omeprazole (PRILOSEC) 10 MG capsule Take 10 mg by mouth daily.        Marland Kitchen  Probiotic Product (PROBIOTIC FORMULA PO) Take by mouth.

## 2012-09-16 ENCOUNTER — Ambulatory Visit: Payer: Self-pay | Admitting: Internal Medicine

## 2012-09-17 ENCOUNTER — Encounter: Payer: Self-pay | Admitting: Internal Medicine

## 2012-09-17 ENCOUNTER — Telehealth: Payer: Self-pay | Admitting: Internal Medicine

## 2012-09-17 NOTE — Telephone Encounter (Signed)
Also his ProTime  was slightly low at 2.0.. If he is taking 2 mg of Coumadin daily and increase his Coumadin to 3 mg on Thursdays and Sundays. Please confirm his current dose first before making a change  His anemia has improved dramatically his hemoglobin hasn't risen two full points. I would stay on the iron for now.

## 2012-09-17 NOTE — Telephone Encounter (Signed)
Mr. Kevin [name]  x-rays of his lumbar spine and right hip show some degenerative changes but no fractures or joint disease needing surgery . There is no evidence of disc herniation.  We discussed using Tylenol for relief of pain versus having a PT evaluation. Have him contact us if he would like to 2 see a physical therapist.

## 2012-09-22 NOTE — Telephone Encounter (Signed)
Patient advised as instructed via telephone, he stated that he takes 3mg  of Coumadin daily.  Please advise.

## 2012-09-23 NOTE — Telephone Encounter (Signed)
Thank you. We will increase his dose of Coumadin to 4 mg on Monday Wednesdays and Fridays 3 mg all other days. Repeat PT/INR in 2 weeks.

## 2012-09-23 NOTE — Telephone Encounter (Signed)
Patient advised via telephone

## 2012-09-29 ENCOUNTER — Encounter: Payer: Self-pay | Admitting: Internal Medicine

## 2012-09-29 ENCOUNTER — Other Ambulatory Visit: Payer: Self-pay | Admitting: Internal Medicine

## 2012-10-07 ENCOUNTER — Telehealth: Payer: Self-pay | Admitting: Internal Medicine

## 2012-10-07 NOTE — Telephone Encounter (Signed)
Coumadin level is better . Reduce coumadin dose to 4 mg on  Mon and Fridays,  3 mg all other days   Repeat in one month

## 2012-10-08 NOTE — Telephone Encounter (Signed)
Left message on patient vm regarding instructions on coumadin dose.

## 2012-10-10 ENCOUNTER — Ambulatory Visit: Payer: Medicare Other | Admitting: Cardiovascular Disease

## 2012-10-16 ENCOUNTER — Ambulatory Visit (INDEPENDENT_AMBULATORY_CARE_PROVIDER_SITE_OTHER): Payer: Medicare Other | Admitting: Cardiovascular Disease

## 2012-10-16 ENCOUNTER — Encounter: Payer: Self-pay | Admitting: Cardiovascular Disease

## 2012-10-16 VITALS — BP 108/62 | HR 57 | Ht 67.0 in | Wt 154.5 lb

## 2012-10-16 DIAGNOSIS — I4891 Unspecified atrial fibrillation: Secondary | ICD-10-CM

## 2012-10-16 DIAGNOSIS — I4892 Unspecified atrial flutter: Secondary | ICD-10-CM

## 2012-10-16 DIAGNOSIS — Z954 Presence of other heart-valve replacement: Secondary | ICD-10-CM

## 2012-10-16 DIAGNOSIS — I10 Essential (primary) hypertension: Secondary | ICD-10-CM

## 2012-10-16 DIAGNOSIS — Z952 Presence of prosthetic heart valve: Secondary | ICD-10-CM

## 2012-10-16 NOTE — Assessment & Plan Note (Signed)
On warfarin. Periodic echocardiogram, will defer to Dr. Juliann Pares.

## 2012-10-16 NOTE — Progress Notes (Signed)
Patient ID: Kevin [name], male    DOB: 02/08/1925, 78 y.o.   MRN: [id number]  HPI Comments: Kevin [name] is a very pleasant 78 year old gentleman, patient of Dr. Juliann Pares, with a history of mitral valve replacement with St. Jude valve in 1994, history of atrial fibrillation, mild restriction on pulmonary function tests who presented initially with worsening shortness of breath over the past 2 months, found to be in atrial flutter.   cardioversion was performed and he obtained normal sinus rhythm with one shock. In followup he reported his shortness of breath had resolved and his energy was better.  He continues to have cramping in his right thigh since April 2013  Previous echocardiogram that showed ejection fraction 50% with mildly elevated right ventricular systolic pressures, mild MR and TR.  Previous  CT scan did not show any PE Chest x-ray showed prominent pulmonary arteries otherwise no prominent new findings He had a transesophageal echo 04/30/2012 that was essentially normal documenting normal functioning mitral valve  EKGs were reviewed with atrial flutter seen in 2008, Atrial flutter with slow ventricular response rate of 50 seen 06/13/2012 Atrial flutter with slow ventricular response rate 47 seen June 03 2012 EKG on last office visit showed normal sinus rhythm with rate 81 beats per minute, ST and T wave abnormality in lead 3 and aVF  EKG today shows normal sinus rhythm with no significant ST-T wave changes   Outpatient Encounter Prescriptions as of 10/16/2012  Medication Sig Dispense Refill  . amLODipine (NORVASC) 10 MG tablet Take 1 tablet (10 mg total) by mouth daily.  30 tablet  6  . brimonidine (ALPHAGAN) 0.15 % ophthalmic solution 2 (two) times daily.       . ergocalciferol (VITAMIN D2) 50000 UNITS capsule Take 50,000 Units by mouth once a week.      . ferrous sulfate 324 (65 FE) MG TBEC Take 1 tablet (325 mg total) by mouth daily with lunch.  30 tablet  3  . L-GLUTAMINE  PO Take by mouth.        . latanoprost (XALATAN) 0.005 % ophthalmic solution       . losartan (COZAAR) 100 MG tablet TAKE ONE TABLET BY MOUTH DAILY  90 tablet  3  . Melatonin CR 3 MG TBCR Take 2 tablets by mouth at bedtime.  120 each  3  . metoprolol tartrate (LOPRESSOR) 25 MG tablet Take 1 tablet (25 mg total) by mouth 2 (two) times daily.  60 tablet  6  . Multiple Vitamin (MULTIVITAMIN) tablet Take 1 tablet by mouth daily.        Marland Kitchen NIACINAMIDE PO Take by mouth.        . Nutritional Supplements (MELATONIN PO) Take by mouth.      . Nutritional Supplements (PYCNOGENOL) 300-30 MG CAPS Take 1 capsule by mouth daily.        . Omega-3 Fatty Acids (OMEGA 3 PO) Take by mouth.        [name] Calcium 500 MG TABS Take two by mouth daily       . Red Yeast Rice Extract (RED YEAST RICE PO) Take by mouth.        . SYRINGE-NEEDLE, DISP, 3 ML (SAFETY-LOK 3CC SYR 22GX1.5") 22G X 1-1/2" 3 ML MISC 2 ml's monthly-given 1 shot monthly       . testosterone cypionate (DEPOTESTOTERONE CYPIONATE) 200 MG/ML injection Inject 0.5 mLs (100 mg total) into the muscle every 21 ( twenty-one) days.  10 mL  1  . TURMERIC PO Take by mouth.        . warfarin (COUMADIN) 3 MG tablet Take 3 mg by mouth as directed.        Review of Systems  Constitutional: Negative.   HENT: Negative.   Eyes: Negative.   Cardiovascular: Negative.   Gastrointestinal: Negative.   Musculoskeletal: Positive for myalgias.       Right thigh pain  Skin: Negative.   Neurological: Negative.   Hematological: Negative.   Psychiatric/Behavioral: Negative.   All other systems reviewed and are negative.    BP 108/62  Pulse 57  Ht 5\' 7"  (1.702 m)  Wt 154 lb 8 oz (70.081 kg)  BMI 24.20 kg/m2  Physical Exam  Nursing note and vitals reviewed. Constitutional: He is oriented to person, place, and time. He appears well-developed and well-nourished.  HENT:  Head: Normocephalic.  Nose: Nose normal.  Mouth/Throat: Oropharynx is clear and moist.   Eyes: Conjunctivae normal are normal. Pupils are equal, round, and reactive to light.  Neck: Normal range of motion. Neck supple. No JVD present.  Cardiovascular: Regular rhythm, S1 normal, S2 normal, normal heart sounds and intact distal pulses.  Bradycardia present.  Exam reveals no gallop and no friction rub.   No murmur heard. Pulmonary/Chest: Effort normal and breath sounds normal. No respiratory distress. He has no wheezes. He has no rales. He exhibits no tenderness.  Abdominal: Soft. Bowel sounds are normal. He exhibits no distension. There is no tenderness.  Musculoskeletal: Normal range of motion. He exhibits no edema and no tenderness.  Lymphadenopathy:    He has no cervical adenopathy.  Neurological: He is alert and oriented to person, place, and time. Coordination normal.  Skin: Skin is warm and dry. No rash noted. No erythema.  Psychiatric: He has a normal mood and affect. His behavior is normal. Judgment and thought content normal.           Assessment and Plan

## 2012-10-16 NOTE — Assessment & Plan Note (Signed)
Blood pressure is well controlled on today's visit. No changes made to the medications. 

## 2012-10-16 NOTE — Patient Instructions (Addendum)
Hold the red yeast rice for a few weeks to see if your leg pain gets better  Please call us if you have new issues that need to be addressed before your next appt.  Your physician wants you to follow-up in: 6 months.  You will receive a reminder letter in the mail two months in advance. If you don't receive a letter, please call our office to schedule the follow-up appointment.

## 2012-10-16 NOTE — Assessment & Plan Note (Signed)
Continues to maintain normal sinus rhythm. No side effects on low dose metoprolol twice a day. No further symptoms of shortness of breath.

## 2012-10-23 ENCOUNTER — Encounter: Payer: Self-pay | Admitting: Internal Medicine

## 2012-10-30 ENCOUNTER — Encounter: Payer: Self-pay | Admitting: Internal Medicine

## 2012-11-04 LAB — PROTIME-INR: INR: 4.2 — AB (ref 0.9–1.1)

## 2012-11-10 ENCOUNTER — Telehealth: Payer: Self-pay | Admitting: Internal Medicine

## 2012-11-10 NOTE — Telephone Encounter (Signed)
Coumadin level is too high.  Please suspend coumadin for 2 days and  confirm current  regimen so I can adjust

## 2012-11-10 NOTE — Telephone Encounter (Signed)
Spoke to patient gave him lab results, his current regimen is Mon and Fri he takes 4 mg and  Tue, Wed, Thur, Sat, and Sun he takes 3 mg.

## 2012-11-10 NOTE — Telephone Encounter (Signed)
Resume coumadin at 3 mg daily every day starting on Wednesday .  Repeat PT/INR next Wednesday at [facility]

## 2012-11-12 NOTE — Telephone Encounter (Signed)
Patient notified as instructed via phone.

## 2012-11-17 ENCOUNTER — Encounter: Payer: Self-pay | Admitting: Internal Medicine

## 2012-11-18 LAB — PROTIME-INR

## 2012-11-24 ENCOUNTER — Telehealth: Payer: Self-pay | Admitting: Internal Medicine

## 2012-11-24 MED ORDER — WARFARIN SODIUM 3 MG PO TABS: ORAL_TABLET | ORAL | Status: DC

## 2012-11-24 NOTE — Telephone Encounter (Signed)
Left detailed message on patient home Vm with instructions on coumadin dosage.

## 2012-11-24 NOTE — Telephone Encounter (Signed)
Left message on patient Vm to call me back for current coumadin regimen

## 2012-11-24 NOTE — Telephone Encounter (Signed)
Spoke to patient he stated that he is taking 3 mg of Coumadin   everyday.

## 2012-11-24 NOTE — Telephone Encounter (Signed)
Increase dose to 6 mg on Mondays and fridays.  3 mg all other days.  Repeat INR in 2 weeks

## 2012-11-24 NOTE — Telephone Encounter (Signed)
Coumadin level is low.  Please confirm current  regimen so I can adjust  

## 2012-12-04 ENCOUNTER — Encounter: Payer: Self-pay | Admitting: Internal Medicine

## 2012-12-10 ENCOUNTER — Encounter: Payer: Self-pay | Admitting: Internal Medicine

## 2012-12-16 ENCOUNTER — Ambulatory Visit (INDEPENDENT_AMBULATORY_CARE_PROVIDER_SITE_OTHER): Payer: Medicare Other | Admitting: Internal Medicine

## 2012-12-16 ENCOUNTER — Encounter: Payer: Self-pay | Admitting: Internal Medicine

## 2012-12-16 VITALS — BP 110/60 | HR 63 | Temp 97.8°F | Resp 15 | Wt 150.5 lb

## 2012-12-16 DIAGNOSIS — E349 Endocrine disorder, unspecified: Secondary | ICD-10-CM

## 2012-12-16 DIAGNOSIS — Z79899 Other long term (current) drug therapy: Secondary | ICD-10-CM

## 2012-12-16 DIAGNOSIS — I4891 Unspecified atrial fibrillation: Secondary | ICD-10-CM

## 2012-12-16 DIAGNOSIS — Z7901 Long term (current) use of anticoagulants: Secondary | ICD-10-CM

## 2012-12-16 DIAGNOSIS — K759 Inflammatory liver disease, unspecified: Secondary | ICD-10-CM

## 2012-12-16 DIAGNOSIS — I10 Essential (primary) hypertension: Secondary | ICD-10-CM

## 2012-12-16 DIAGNOSIS — Z8639 Personal history of other endocrine, nutritional and metabolic disease: Secondary | ICD-10-CM

## 2012-12-16 DIAGNOSIS — E291 Testicular hypofunction: Secondary | ICD-10-CM

## 2012-12-16 LAB — COMPREHENSIVE METABOLIC PANEL
ALT: 23 U/L (ref 0–53)
CO2: 30 mEq/L (ref 19–32)
Calcium: 9.5 mg/dL (ref 8.4–10.5)
Chloride: 102 mEq/L (ref 96–112)
GFR: 81.98 mL/min (ref >60.00)
Glucose, Bld: 93 mg/dL (ref 70–99)
Sodium: 137 mEq/L (ref 135–145)
Total Bilirubin: 1 mg/dL (ref 0.3–1.2)
Total Protein: 7.1 g/dL (ref 6.0–8.3)

## 2012-12-16 LAB — HEPATIC FUNCTION PANEL
AST: 42 U/L — ABNORMAL HIGH (ref 0–37)
Albumin: 4.2 g/dL (ref 3.5–5.2)
Total Bilirubin: 1 mg/dL (ref 0.3–1.2)

## 2012-12-16 MED ORDER — TESTOSTERONE CYPIONATE 200 MG/ML IM SOLN: 100.0000 mg | INTRAMUSCULAR | Status: DC

## 2012-12-16 MED ORDER — WARFARIN SODIUM 3 MG PO TABS: ORAL_TABLET | ORAL | Status: DC

## 2012-12-16 NOTE — Patient Instructions (Addendum)
  We are checking your PT/INR today because of your new drug medication    We will reduce your testosterone dose    You can stop your iron supplements for 3 months and repeat your CBC in iron  in April

## 2012-12-16 NOTE — Assessment & Plan Note (Signed)
Well controlled on current medications.  No changes today. 

## 2012-12-16 NOTE — Progress Notes (Signed)
Patient ID: Kevin [name], male   DOB: 03-Oct-1925, 79 y.o.   MRN: [id number]  Patient Active Problem List  Diagnosis  . Long term (current) use of anticoagulants  . Atrial fibrillation  . Chronic headaches  . History of mitral valve replacement with mechanical valve  . Sciatica  . Hypertension  . Bradycardia  . Neuropathy of lower extremity  . Testosterone deficiency  . Depression  . Esophageal reflux  . Atrial flutter  . Anemia, iron deficiency  . Leg pain, right  . Osteoporosis, senile    Subjective:  CC:   Chief Complaint  Patient presents with  . Follow-up    HPI:   DASANI CREAR a 79 y.o. male who presents for a 3 month follow up on atrial fibrillation hip pain,  Low testosterone.  He has been going to PT for right hip pain,with no appreciable improvement.  Pain started after swiimming and is aggravated by walking.  Right lateral thigh.  Sees DO next week. 2) He is constantly thinking about sex and would like to reduce his testosterone supplements that he receives every 21 days. 3) Has started a new nutraceutical,  cissus quadrangularis,  1000 mg daily for relaxation several weeks ago and has not had his warfarin rechecked.    Past Medical History  Diagnosis Date  . Atrial fibrillation   . Sciatica   . Chronic headaches started age 22  . History of mitral valve replacement with mechanical valve   . Sciatica   . Osteoporosis     secondary to low testoerone  . Cancer     melanoma- right ear  . GERD (gastroesophageal reflux disease)   . Glaucoma   . Hypertension     Past Surgical History  Procedure Date  . Nasal septum surgery 1963  . Cataract extraction, bilateral     x 2  . Melanoma excision     right ear  . Mitral valve replacement 1994  . Hernia repair     x 2  . Cardioversion 2013         The following portions of the patient's history were reviewed and updated as appropriate: Allergies, current medications, and problem list.    Review of  Systems:   12 Pt  review of systems was negative except those addressed in the HPI,     History   Social History  . Marital Status: Widowed    Spouse Name: N/A    Number of Children: N/A  . Years of Education: N/A   Occupational History  . Not on file.   Social History Main Topics  . Smoking status: Never Smoker   . Smokeless tobacco: Never Used  . Alcohol Use: No  . Drug Use: No  . Sexually Active: Not on file   Other Topics Concern  . Not on file   Social History Narrative   Exercise; light 3 x weekLives alone, widowed at [employer]    Objective:  BP 110/60  Pulse 63  Temp 97.8 F (36.6 C) (Oral)  Resp 15  Wt 150 lb 8 oz (68.266 kg)  SpO2 96%  General appearance: alert, cooperative and appears stated age Ears: normal TM's and external ear canals both ears Throat: lips, mucosa, and tongue normal; teeth and gums normal Neck: no adenopathy, no carotid bruit, supple, symmetrical, trachea midline and thyroid not enlarged, symmetric, no tenderness/mass/nodules Back: symmetric, no curvature. ROM normal. No CVA tenderness. Lungs: clear to auscultation bilaterally Heart: regular rate and rhythm, S1,  S2 normal, no murmur, click, rub or gallop Abdomen: soft, non-tender; bowel sounds normal; no masses,  no organomegaly Pulses: 2+ and symmetric Skin: Skin color, texture, turgor normal. No rashes or lesions Lymph nodes: Cervical, supraclavicular, and axillary nodes normal.  Assessment and Plan:  Atrial fibrillation Well controlled on current medications.  No changes today.  Hypertension Well controlled on current medications.  No changes today.  Long term (current) use of anticoagulants He has recurrent supratherapeutic INRs due to use of nutraceuticals.  Today is no different, INR is 5.0.  will suspend dose for 48 hours and resume at 3 mg daily   Testosterone deficiency Reducing dose to monthly 100 mg    Updated Medication List Outpatient Encounter  Prescriptions as of 12/16/2012  Medication Sig Dispense Refill  . amLODipine (NORVASC) 10 MG tablet Take 1 tablet (10 mg total) by mouth daily.  30 tablet  6  . brimonidine (ALPHAGAN) 0.15 % ophthalmic solution 2 (two) times daily.       . DHA-EPA-Vit B6-B12-Folic Acid (CARDIOVID PLUS) CAPS Take 2 capsules by mouth 2 (two) times daily.      . ergocalciferol (VITAMIN D2) 50000 UNITS capsule Take 50,000 Units by mouth once a week.      . ferrous sulfate 324 (65 FE) MG TBEC Take 1 tablet (325 mg total) by mouth daily with lunch.  30 tablet  3  . L-GLUTAMINE PO Take by mouth.        . latanoprost (XALATAN) 0.005 % ophthalmic solution       . losartan (COZAAR) 100 MG tablet TAKE ONE TABLET BY MOUTH DAILY  90 tablet  3  . Melatonin CR 3 MG TBCR Take 2 tablets by mouth at bedtime.  120 each  3  . metoprolol tartrate (LOPRESSOR) 25 MG tablet Take 1 tablet (25 mg total) by mouth 2 (two) times daily.  60 tablet  6  . Multiple Vitamin (MULTIVITAMIN) tablet Take 1 tablet by mouth daily.        Marland Kitchen NIACINAMIDE PO Take by mouth.        . Nutritional Supplements (MELATONIN PO) Take by mouth.      . Nutritional Supplements (PYCNOGENOL) 300-30 MG CAPS Take 1 capsule by mouth daily.        . Omega-3 Fatty Acids (OMEGA 3 PO) Take by mouth.        [name] Calcium 500 MG TABS Take two by mouth daily       . Red Yeast Rice Extract (RED YEAST RICE PO) Take by mouth.        . SYRINGE-NEEDLE, DISP, 3 ML (SAFETY-LOK 3CC SYR 22GX1.5") 22G X 1-1/2" 3 ML MISC 2 ml's monthly-given 1 shot monthly       . testosterone cypionate (DEPOTESTOTERONE CYPIONATE) 200 MG/ML injection Inject 0.5 mLs (100 mg total) into the muscle every 28 (twenty-eight) days.  10 mL  1  . TURMERIC PO Take by mouth.        . warfarin (COUMADIN) 3 MG tablet 3 mg daily every day  30 tablet  1  . [DISCONTINUED] testosterone cypionate (DEPOTESTOTERONE CYPIONATE) 200 MG/ML injection Inject 0.5 mLs (100 mg total) into the muscle every 21 ( twenty-one)  days.  10 mL  1  . [DISCONTINUED] warfarin (COUMADIN) 3 MG tablet 3 mg daily every day but Mondays and fridays.  Take 6 mg on those days  40 tablet  1     Orders Placed This Encounter  Procedures  . Comprehensive  metabolic panel  . Protime-INR  . Comprehensive metabolic panel  . Hepatic function panel    Return in about 3 months (around 03/16/2013).

## 2012-12-16 NOTE — Assessment & Plan Note (Signed)
He has recurrent supratherapeutic INRs due to use of nutraceuticals.  Today is no different, INR is 5.0.  will suspend dose for 48 hours and resume at 3 mg daily

## 2012-12-16 NOTE — Assessment & Plan Note (Signed)
Reducing dose to monthly 100 mg

## 2012-12-17 LAB — HEPATITIS PANEL, ACUTE
Hep A IgM: NEGATIVE
Hep B C IgM: NEGATIVE
Hepatitis B Surface Ag: NEGATIVE

## 2012-12-17 LAB — TESTOSTERONE, FREE, TOTAL, SHBG: Testosterone: 914.1 ng/dL — ABNORMAL HIGH (ref 300–890)

## 2012-12-26 ENCOUNTER — Other Ambulatory Visit: Payer: Self-pay | Admitting: Internal Medicine

## 2012-12-29 LAB — PROTIME-INR

## 2012-12-30 ENCOUNTER — Telehealth: Payer: Self-pay | Admitting: Internal Medicine

## 2012-12-30 DIAGNOSIS — Z952 Presence of prosthetic heart valve: Secondary | ICD-10-CM

## 2012-12-30 NOTE — Telephone Encounter (Signed)
Pt.notified

## 2012-12-30 NOTE — Telephone Encounter (Signed)
NR is low at 2.2 on 3 mg daily. i ncrease to 6 mg tonight (Tuesday ) and Fridays, 3.0 all other nights.  Repeat in 2 weeks.

## 2012-12-30 NOTE — Assessment & Plan Note (Addendum)
INR is low at 2.2 on 3 mg daily. i ncrease to 6 mg tonight (Tuesday ) and Fridays, 3.0 all other nights.  Repeat in 2 weeks.

## 2012-12-31 ENCOUNTER — Encounter: Payer: Self-pay | Admitting: Internal Medicine

## 2013-01-10 ENCOUNTER — Encounter: Payer: Self-pay | Admitting: Internal Medicine

## 2013-01-14 LAB — PROTIME-INR

## 2013-01-16 ENCOUNTER — Telehealth: Payer: Self-pay | Admitting: Internal Medicine

## 2013-01-16 NOTE — Telephone Encounter (Signed)
INR is high,  Suspend coumadin,. resuem on Sunday at regular dose and repeat INR in 2 weeks,  Has he added any new  supplements?

## 2013-01-16 NOTE — Telephone Encounter (Signed)
Left message for pt notifying him of the INR.

## 2013-01-23 ENCOUNTER — Other Ambulatory Visit: Payer: Self-pay | Admitting: Internal Medicine

## 2013-01-24 ENCOUNTER — Other Ambulatory Visit: Payer: Self-pay | Admitting: Cardiovascular Disease

## 2013-01-26 ENCOUNTER — Other Ambulatory Visit: Payer: Self-pay | Admitting: *Deleted

## 2013-01-26 MED ORDER — METOPROLOL TARTRATE 25 MG PO TABS: 25.0000 mg | ORAL_TABLET | Freq: Two times a day (BID) | ORAL | Status: DC

## 2013-01-26 MED ORDER — AMLODIPINE BESYLATE 10 MG PO TABS: 10.0000 mg | ORAL_TABLET | Freq: Every day | ORAL | Status: DC

## 2013-01-26 NOTE — Telephone Encounter (Signed)
Refilled Metoprolol sent to [facility].

## 2013-01-26 NOTE — Telephone Encounter (Signed)
Med filled.  

## 2013-01-30 LAB — PROTIME-INR: INR: 4.8 — AB (ref 0.9–1.1)

## 2013-02-02 ENCOUNTER — Encounter: Payer: Self-pay | Admitting: Internal Medicine

## 2013-02-04 ENCOUNTER — Other Ambulatory Visit: Payer: Self-pay | Admitting: Internal Medicine

## 2013-02-04 DIAGNOSIS — Z7901 Long term (current) use of anticoagulants: Secondary | ICD-10-CM

## 2013-02-04 NOTE — Telephone Encounter (Signed)
Have him resume a regimen of 3 mg daily starting On Friday .  Repeat an INR next Wednesday at his facility.

## 2013-02-04 NOTE — Telephone Encounter (Signed)
Patient notified as instructed by telephone. Was advised by patient that he is taking Coumadin 6 mg on Monday and Thursday and 3 mg the other days. Patient states that he has not started any new supplements.

## 2013-02-04 NOTE — Telephone Encounter (Signed)
Coumadin level is high again.   Please suspend dose for one day and confirm current  regimen so I can adjust dose and ask him if he started any new supplements

## 2013-02-05 MED ORDER — WARFARIN SODIUM 3 MG PO TABS: ORAL_TABLET | ORAL | Status: DC

## 2013-02-05 NOTE — Telephone Encounter (Signed)
Spoke with patient and advised results   

## 2013-02-11 LAB — PROTIME-INR: INR: 2.5 — AB (ref 0.9–1.1)

## 2013-02-14 ENCOUNTER — Other Ambulatory Visit: Payer: Self-pay | Admitting: Internal Medicine

## 2013-02-16 ENCOUNTER — Telehealth: Payer: Self-pay | Admitting: Internal Medicine

## 2013-02-16 NOTE — Telephone Encounter (Signed)
Coumadin level is therapeutic,  Confirm current regimen and repeat PT/INR in one month./

## 2013-02-16 NOTE — Telephone Encounter (Signed)
Pt notified of the results.  

## 2013-02-19 ENCOUNTER — Encounter: Payer: Self-pay | Admitting: Internal Medicine

## 2013-02-22 ENCOUNTER — Other Ambulatory Visit: Payer: Self-pay | Admitting: Internal Medicine

## 2013-03-06 ENCOUNTER — Encounter: Payer: Self-pay | Admitting: Internal Medicine

## 2013-03-11 LAB — PROTIME-INR

## 2013-03-15 ENCOUNTER — Telehealth: Payer: Self-pay | Admitting: Internal Medicine

## 2013-03-15 DIAGNOSIS — Z7901 Long term (current) use of anticoagulants: Secondary | ICD-10-CM

## 2013-03-15 NOTE — Telephone Encounter (Signed)
Kevin [name] was called on Sunday morning and told (by me) to stop his coumadin bc I reviewed his INR that was faxed over on Friday and it was critically high.  Please call [facility] and find out why the office was not called with a critically high INR of 5.0  thanks

## 2013-03-15 NOTE — Assessment & Plan Note (Signed)
INR on March 12 2013  was 5.0 on 3 mg x 5 days,  6 mg on 2 days,  With no recent additions or deletions of his many "natural" supplements. The critical INR was not called to office so the report was not seen until 03/15/13.  Patient was called by Dr. Darrick Huntsman at 07:40 am and instructed to suspend coumadin .  Office visit on Monday.  April 7 ,  No bleeding or bruising reported by patient.,  Goal INR is 2.5 to 3.5 for history of atrial fib and MV replacement

## 2013-03-16 ENCOUNTER — Encounter: Payer: Self-pay | Admitting: Internal Medicine

## 2013-03-16 ENCOUNTER — Ambulatory Visit (INDEPENDENT_AMBULATORY_CARE_PROVIDER_SITE_OTHER): Payer: Medicare Other | Admitting: Internal Medicine

## 2013-03-16 VITALS — BP 112/65 | HR 55 | Temp 97.8°F | Resp 16 | Wt 149.5 lb

## 2013-03-16 DIAGNOSIS — E349 Endocrine disorder, unspecified: Secondary | ICD-10-CM

## 2013-03-16 DIAGNOSIS — M5431 Sciatica, right side: Secondary | ICD-10-CM

## 2013-03-16 DIAGNOSIS — I10 Essential (primary) hypertension: Secondary | ICD-10-CM

## 2013-03-16 DIAGNOSIS — M5430 Sciatica, unspecified side: Secondary | ICD-10-CM

## 2013-03-16 DIAGNOSIS — Z7901 Long term (current) use of anticoagulants: Secondary | ICD-10-CM

## 2013-03-16 DIAGNOSIS — E291 Testicular hypofunction: Secondary | ICD-10-CM

## 2013-03-16 DIAGNOSIS — E785 Hyperlipidemia, unspecified: Secondary | ICD-10-CM

## 2013-03-16 NOTE — Assessment & Plan Note (Addendum)
INR on 27 mg of Coumadin weekly (3 mg  x 5,  6 mg x 2 )  was 5.0 recently.  I have suspended his dose for 2 days and in resuming it at 21 mg (3 mg daily) weekly  . Repeat INR next week goal INR is 2.5-3.5.

## 2013-03-16 NOTE — Assessment & Plan Note (Signed)
He has not had fasting lipids in nearly a year. These will be ordered next week.

## 2013-03-16 NOTE — Assessment & Plan Note (Signed)
He has degenerative disc disease noted on prior lumbar spine films at all levels. With some anterolisthesis complicated by scoliosis. His osteopath does not feel that his hip pain radiating to his knee is due to his lower back. He is following up with him in one week. If she does not get lasting relief from his osteopath we will consider an MRI the lumbar spine

## 2013-03-16 NOTE — Patient Instructions (Addendum)
Suspend coumadin for one more day,.  Resume at 3 mg daily except on Thursdays take 6 mg.  Repeat your cholesterol  INR and your testosterone level next week before your testosterone injection.  Please be fasting for your lab draw.

## 2013-03-16 NOTE — Progress Notes (Signed)
Patient ID: Kevin [name], male   DOB: 03/18/1925, 79 y.o.   MRN: [id number]   Patient Active Problem List  Diagnosis  . Long term (current) use of anticoagulants  . Atrial fibrillation  . Chronic headaches  . History of mitral valve replacement with mechanical valve  . Hypertension  . Bradycardia  . Neuropathy of lower extremity  . Testosterone deficiency  . Depression  . Esophageal reflux  . Atrial flutter  . Anemia, iron deficiency  . Leg pain, right  . Osteoporosis, senile  . Sciatica of right side  . Other and unspecified hyperlipidemia    Subjective:  CC:   Chief Complaint  Patient presents with  . Follow-up    Patient states routine/ coumadin level    HPI:   Kevin [name] a 80 y.o. male who presents for three-month followup on chronic conditions including hip pain, hypertension, osteoporosis secondary to low testosterone, and atrial fibrillation. He has had persistent right hip painThat he has been concerned is due to sciatica. Areas pain does not involve the lower leg only to the knee. He has had no improvement with physical therapy. He has some kind of osteopathic treatment  2 weeks ago which involve massage and pressure application to the lateral hip which transiently resolved his pain  but the pain returned after swimming..  however he has been pain-free since last Saturday. He was instructed to suspend his Coumadin over the weekend when his INR was received and found to be quite high at 5.0. He has had no bruising or bleeding. He states he has not started any new supplements which have historically been  the cause of his elevated INRs. He is distressed that his cardiologist Dr. Juliann Pares has joined [facility] and wonders if the quality of care will suffer. He likes Dr. Mariah Milling and is interested in seeing  him if Dr. Glennis Brink delivery care changes.    Past Medical History  Diagnosis Date  . Atrial fibrillation   . Sciatica   . Chronic headaches started age 38   . History of mitral valve replacement with mechanical valve   . Sciatica   . Osteoporosis     secondary to low testoerone  . Cancer     melanoma- right ear  . GERD (gastroesophageal reflux disease)   . Glaucoma   . Hypertension     Past Surgical History  Procedure Laterality Date  . Nasal septum surgery  1963  . Cataract extraction, bilateral      x 2  . Melanoma excision      right ear  . Mitral valve replacement  1994  . Hernia repair      x 2  . Cardioversion  2013       The following portions of the patient's history were reviewed and updated as appropriate: Allergies, current medications, and problem list.    Review of Systems:   Patient denies headache, fevers, malaise, unintentional weight loss, skin rash, eye pain, sinus congestion and sinus pain, sore throat, dysphagia,  hemoptysis , cough, dyspnea, wheezing, chest pain, palpitations, orthopnea, edema, abdominal pain, nausea, melena, diarrhea, constipation, flank pain, dysuria, hematuria, urinary  Frequency, nocturia, numbness, tingling, seizures,  Focal weakness, Loss of consciousness,  Tremor, insomnia, depression, anxiety, and suicidal ideation.     History   Social History  . Marital Status: Widowed    Spouse Name: N/A    Number of Children: N/A  . Years of Education: N/A   Occupational History  .  Not on file.   Social History Main Topics  . Smoking status: Never Smoker   . Smokeless tobacco: Never Used  . Alcohol Use: No  . Drug Use: No  . Sexually Active: Not on file   Other Topics Concern  . Not on file   Social History Narrative   Exercise; light 3 x week   Lives alone, widowed at [facility]          Objective:  BP 112/65  Pulse 55  Temp(Src) 97.8 F (36.6 C) (Oral)  Resp 16  Wt 149 lb 8 oz (67.813 kg)  BMI 23.41 kg/m2  SpO2 96%  General appearance: alert, cooperative and appears stated age Ears: normal TM's and external ear canals both ears Throat: lips, mucosa, and  tongue normal; teeth and gums normal Neck: no adenopathy, no carotid bruit, supple, symmetrical, trachea midline and thyroid not enlarged, symmetric, no tenderness/mass/nodules Back: symmetric, no curvature. ROM normal. No CVA tenderness. Lungs: clear to auscultation bilaterally Heart: regular rate and rhythm, S1, S2 normal, no murmur, click, rub or gallop Abdomen: soft, non-tender; bowel sounds normal; no masses,  no organomegaly Pulses: 2+ and symmetric Skin: Skin color, texture, turgor normal. No rashes or lesions Lymph nodes: Cervical, supraclavicular, and axillary nodes normal. MSK: negative straight leg lift on right.  No foot drop. Hip ROM normal  Assessment and Plan:  Testosterone deficiency We have supplemented his low testosterone level given his concurrent diagnosis of osteoporosis. We reduce his dosing to every 3 weeks in January due to an elevated testosterone level and patient complaints of preoccupation with sexual thoughts. I have asked the nurse at [facility] to repeat a testosterone level prior to his next injection and x-ray.  Sciatica of right side He has degenerative disc disease noted on prior lumbar spine films at all levels. With some anterolisthesis complicated by scoliosis. His osteopath does not feel that his hip pain radiating to his knee is due to his lower back. He is following up with him in one week. If she does not get lasting relief from his osteopath we will consider an MRI the lumbar spine  Long term (current) use of anticoagulants INR on 27 mg of Coumadin weekly (3 mg  x 5,  6 mg x 2 )  was 5.0 recently.  I have suspended his dose for 2 days and in resuming it at 21 mg (3 mg daily) weekly  . Repeat INR next week goal INR is 2.5-3.5.   Hypertension Well controlled on current regimen. Renal function stable, no changes today.  Other and unspecified hyperlipidemia He has not had fasting lipids in nearly a year. These will be ordered next week.   Updated  Medication List Outpatient Encounter Prescriptions as of 03/16/2013  Medication Sig Dispense Refill  . amLODipine (NORVASC) 10 MG tablet Take 1 tablet (10 mg total) by mouth daily.  30 tablet  6  . amLODipine (NORVASC) 10 MG tablet TAKE 1 TABLET BY MOUTH EVERY DAY  30 tablet  5  . brimonidine (ALPHAGAN) 0.15 % ophthalmic solution 2 (two) times daily.       . DHA-EPA-Vit B6-B12-Folic Acid (CARDIOVID PLUS) CAPS Take 2 capsules by mouth 2 (two) times daily.      . ergocalciferol (VITAMIN D2) 50000 UNITS capsule Take 50,000 Units by mouth once a week.      . ferrous sulfate 324 (65 FE) MG TBEC Take 1 tablet (325 mg total) by mouth daily with lunch.  30 tablet  3  .  L-GLUTAMINE PO Take by mouth.        . latanoprost (XALATAN) 0.005 % ophthalmic solution       . losartan (COZAAR) 100 MG tablet TAKE ONE TABLET BY MOUTH DAILY  90 tablet  3  . Melatonin CR 3 MG TBCR Take 2 tablets by mouth at bedtime.  120 each  3  . metoprolol tartrate (LOPRESSOR) 25 MG tablet Take 1 tablet (25 mg total) by mouth 2 (two) times daily.  60 tablet  6  . Multiple Vitamin (MULTIVITAMIN) tablet Take 1 tablet by mouth daily.        . Nutritional Supplements (PYCNOGENOL) 300-30 MG CAPS Take 1 capsule by mouth daily.        . Omega-3 Fatty Acids (OMEGA 3 PO) Take by mouth.        [name] Calcium 500 MG TABS Take two by mouth daily       . Red Yeast Rice Extract (RED YEAST RICE PO) Take by mouth.        . SYRINGE-NEEDLE, DISP, 3 ML (SAFETY-LOK 3CC SYR 22GX1.5") 22G X 1-1/2" 3 ML MISC 2 ml's monthly-given 1 shot monthly       . testosterone cypionate (DEPOTESTOTERONE CYPIONATE) 200 MG/ML injection Inject 0.5 mLs (100 mg total) into the muscle every 28 (twenty-eight) days.  10 mL  1  . TURMERIC PO Take by mouth.        . warfarin (COUMADIN) 3 MG tablet 3 mg daily every day  30 tablet  1  . warfarin (COUMADIN) 3 MG tablet TAKE 2 TABLETS BY MOUTH EVERY DAY  60 tablet  0  . NIACINAMIDE PO Take by mouth.        . Nutritional  Supplements (MELATONIN PO) Take by mouth.       No facility-administered encounter medications on file as of 03/16/2013.     No orders of the defined types were placed in this encounter.    Return in about 3 months (around 06/15/2013).

## 2013-03-16 NOTE — Assessment & Plan Note (Signed)
Well controlled on current regimen. Renal function stable, no changes today. 

## 2013-03-16 NOTE — Assessment & Plan Note (Addendum)
We have supplemented his low testosterone level given his concurrent diagnosis of osteoporosis. We reduce his dosing to every 3 weeks in January due to an elevated testosterone level and patient complaints of preoccupation with sexual thoughts. I have asked the nurse at [facility] to repeat a testosterone level prior to his next injection and x-ray.

## 2013-03-24 ENCOUNTER — Other Ambulatory Visit: Payer: Self-pay | Admitting: Internal Medicine

## 2013-03-24 NOTE — Telephone Encounter (Signed)
Med filled.  

## 2013-03-31 ENCOUNTER — Telehealth: Payer: Self-pay | Admitting: Internal Medicine

## 2013-03-31 DIAGNOSIS — E785 Hyperlipidemia, unspecified: Secondary | ICD-10-CM

## 2013-03-31 NOTE — Telephone Encounter (Signed)
Coumadin level is therapeutic,  Continue current regimen and repeat PT/INR in one month./  Cholesterol is elevated compared to last year. It may be due to the testosterone he is  taking.  I would like him to continue red yeast rice and suspend testosterone injections for 3 months and we will recheck.   Liver and kidney function all normal

## 2013-03-31 NOTE — Telephone Encounter (Signed)
please update chart to reflect what patient states he is taking

## 2013-03-31 NOTE — Telephone Encounter (Signed)
Patient does not have voicemail could not leave message will continue to try to reach patient.

## 2013-03-31 NOTE — Assessment & Plan Note (Signed)
LDL 168  HDL 52 trigs 80  April 2014

## 2013-03-31 NOTE — Telephone Encounter (Signed)
Patient states he current regimen is 3 mg for 5 days and 6 mg for two days please verify because emar states differently.

## 2013-04-01 ENCOUNTER — Other Ambulatory Visit: Payer: Self-pay | Admitting: *Deleted

## 2013-04-01 MED ORDER — WARFARIN SODIUM 3 MG PO TABS: 3.0000 mg | ORAL_TABLET | Freq: Every day | ORAL | Status: DC

## 2013-04-08 ENCOUNTER — Encounter: Payer: Self-pay | Admitting: Internal Medicine

## 2013-04-21 ENCOUNTER — Other Ambulatory Visit: Payer: Self-pay | Admitting: Internal Medicine

## 2013-04-22 ENCOUNTER — Other Ambulatory Visit: Payer: Self-pay | Admitting: Cardiovascular Disease

## 2013-04-22 NOTE — Telephone Encounter (Signed)
Refilled Metoprolol sent to [facility] pharmacy. 

## 2013-04-27 ENCOUNTER — Ambulatory Visit (INDEPENDENT_AMBULATORY_CARE_PROVIDER_SITE_OTHER): Payer: Medicare Other | Admitting: Cardiovascular Disease

## 2013-04-27 ENCOUNTER — Encounter: Payer: Self-pay | Admitting: Cardiovascular Disease

## 2013-04-27 VITALS — BP 110/70 | HR 60 | Ht 67.0 in | Wt 149.0 lb

## 2013-04-27 DIAGNOSIS — I4891 Unspecified atrial fibrillation: Secondary | ICD-10-CM

## 2013-04-27 DIAGNOSIS — E785 Hyperlipidemia, unspecified: Secondary | ICD-10-CM | POA: Insufficient documentation

## 2013-04-27 DIAGNOSIS — I4892 Unspecified atrial flutter: Secondary | ICD-10-CM

## 2013-04-27 DIAGNOSIS — I10 Essential (primary) hypertension: Secondary | ICD-10-CM

## 2013-04-27 MED ORDER — ATORVASTATIN CALCIUM 10 MG PO TABS: 10.0000 mg | ORAL_TABLET | Freq: Every day | ORAL | Status: DC

## 2013-04-27 NOTE — Assessment & Plan Note (Signed)
We have suggested he start Lipitor 10 mg daily. This can be titrated upwards over the next year.

## 2013-04-27 NOTE — Assessment & Plan Note (Signed)
Blood pressure is well controlled on today's visit. No changes made to the medications. 

## 2013-04-27 NOTE — Assessment & Plan Note (Signed)
Maintaining normal sinus rhythm on today's visit. No changes to his medications 

## 2013-04-27 NOTE — Patient Instructions (Addendum)
You are doing well. Please start atorvastatin one a day for cholesterol  Please call us if you have new issues that need to be addressed before your next appt.  Call for any heart arrhythymia problems  Your physician wants you to follow-up in: 6 months.  You will receive a reminder letter in the mail two months in advance. If you don't receive a letter, please call our office to schedule the follow-up appointment.

## 2013-04-27 NOTE — Progress Notes (Signed)
Patient ID: Kevin [name], male    DOB: 09-11-1925, 79 y.o.   MRN: [id number]  HPI Comments: Kevin [name] is a very pleasant 79 year old gentleman, patient of Dr. Juliann Pares, with a history of mitral valve replacement with [employer] Jude valve in 1994, history of atrial fibrillation, mild restriction on pulmonary function tests who presented initially with worsening shortness of breath, found to be in atrial flutter.   cardioversion was performed and he obtained normal sinus rhythm with one shock. In followup he reported his shortness of breath  resolved and his energy was better.  On previous evaluation, reported having cramping in his right thigh since April 2013  Today reports that overall he feels well. No palpitations. He is sedentary.  Total cholesterol 236  Previous echocardiogram that showed ejection fraction 50% with mildly elevated right ventricular systolic pressures, mild MR and TR.  Previous  CT scan did not show any PE Chest x-ray showed prominent pulmonary arteries otherwise no prominent new findings He had a transesophageal echo 04/30/2012 that was essentially normal documenting normal functioning mitral valve  EKGs were reviewed with atrial flutter seen in 2008, Atrial flutter with slow ventricular response rate of 50 seen 06/13/2012 Atrial flutter with slow ventricular response rate 47 seen June 03 2012 EKG on last office visit showed normal sinus rhythm with rate 81 beats per minute, ST and T wave abnormality in lead 3 and aVF  EKG today shows normal sinus rhythm with no significant ST-T wave changes, rate 60 beats per minute   Outpatient Encounter Prescriptions as of 04/27/2013  Medication Sig Dispense Refill  . 5-Hydroxytryptophan (5-HTP PO) Take by mouth daily.      . ALPHA LIPOIC ACID PO Take by mouth daily.      Marland Kitchen amLODipine (NORVASC) 10 MG tablet Take 1 tablet (10 mg total) by mouth daily.  30 tablet  6  . brimonidine (ALPHAGAN) 0.15 % ophthalmic solution 2 (two) times  daily.       . Calcium-Magnesium-Vitamin D (CALCIUM MAGNESIUM PO) Take by mouth 2 (two) times daily.      . DHA-EPA-Vit B6-B12-Folic Acid (CARDIOVID PLUS) CAPS Take 2 capsules by mouth 2 (two) times daily.      . ergocalciferol (VITAMIN D2) 50000 UNITS capsule Take 50,000 Units by mouth once a week.      . ferrous sulfate 324 (65 FE) MG TBEC Take 1 tablet (325 mg total) by mouth daily with lunch.  30 tablet  3  . Guggulipid (GUGULIPID PO) Take by mouth daily.      . L-GLUTAMINE PO Take by mouth.        . latanoprost (XALATAN) 0.005 % ophthalmic solution daily.       Marland Kitchen losartan (COZAAR) 100 MG tablet TAKE ONE TABLET BY MOUTH DAILY  90 tablet  3  . LUMIGAN 0.01 % SOLN 1 drop at bedtime.       . metoprolol tartrate (LOPRESSOR) 25 MG tablet Take 1 tablet (25 mg total) by mouth 2 (two) times daily.  60 tablet  6  . Multiple Vitamin (MULTIVITAMIN) tablet Take 1 tablet by mouth daily.        . NON FORMULARY Memory Essentials 2 tablets BID.      Marland Kitchen NON FORMULARY Pro Liver Milk Thistle and Acetyl-Cysteine 1 tablet daily.      . Nutritional Supplements (MELATONIN PO) Take by mouth.      . Omega-3 Fatty Acids (OMEGA 3 PO) Take by mouth.        Marland Kitchen  omeprazole (PRILOSEC) 40 MG capsule daily.       [name] Calcium 500 MG TABS Take two by mouth daily       . Red Yeast Rice Extract (RED YEAST RICE PO) Take by mouth.        . SYRINGE-NEEDLE, DISP, 3 ML (SAFETY-LOK 3CC SYR 22GX1.5") 22G X 1-1/2" 3 ML MISC 2 ml's monthly-given 1 shot monthly       . testosterone cypionate (DEPOTESTOTERONE CYPIONATE) 200 MG/ML injection Inject 0.5 mLs (100 mg total) into the muscle every 28 (twenty-eight) days.  10 mL  1  . triamcinolone cream (KENALOG) 0.1 % daily.       . TURMERIC PO Take by mouth.        . warfarin (COUMADIN) 3 MG tablet Take 1 tablet (3 mg total) by mouth daily.  60 tablet  2    Review of Systems  Constitutional: Negative.   HENT: Negative.   Eyes: Negative.   Cardiovascular: Negative.    Gastrointestinal: Negative.   Musculoskeletal: Positive for myalgias.       Right thigh pain  Skin: Negative.   Neurological: Negative.   Psychiatric/Behavioral: Negative.   All other systems reviewed and are negative.    BP 110/70  Pulse 60  Ht 5\' 7"  (1.702 m)  Wt 149 lb (67.586 kg)  BMI 23.33 kg/m2  Physical Exam  Nursing note and vitals reviewed. Constitutional: He is oriented to person, place, and time. He appears well-developed and well-nourished.  HENT:  Head: Normocephalic.  Nose: Nose normal.  Mouth/Throat: Oropharynx is clear and moist.  Eyes: Conjunctivae are normal. Pupils are equal, round, and reactive to light.  Neck: Normal range of motion. Neck supple. No JVD present.  Cardiovascular: Regular rhythm, S1 normal, S2 normal, normal heart sounds and intact distal pulses.  Bradycardia present.  Exam reveals no gallop and no friction rub.   No murmur heard. Pulmonary/Chest: Effort normal and breath sounds normal. No respiratory distress. He has no wheezes. He has no rales. He exhibits no tenderness.  Abdominal: Soft. Bowel sounds are normal. He exhibits no distension. There is no tenderness.  Musculoskeletal: Normal range of motion. He exhibits no edema and no tenderness.  Lymphadenopathy:    He has no cervical adenopathy.  Neurological: He is alert and oriented to person, place, and time. Coordination normal.  Skin: Skin is warm and dry. No rash noted. No erythema.  Psychiatric: He has a normal mood and affect. His behavior is normal. Judgment and thought content normal.      Assessment and Plan

## 2013-04-28 ENCOUNTER — Telehealth: Payer: Self-pay | Admitting: Internal Medicine

## 2013-04-28 NOTE — Telephone Encounter (Signed)
Called [facility] and a critical for them is 5.9, I requested to have it changed, they will be faxing over information for [place] to fill out to make the changes

## 2013-04-28 NOTE — Telephone Encounter (Signed)
INR is way too high,  From May 14th.  This should have been handled by one of the other physicians during my absence.  Please find out from [facility] what their "critical high"  Cut off is and why we were not called.  Patient needs to stop his coumadin and come in tomorrow for an INR

## 2013-04-28 NOTE — Telephone Encounter (Signed)
Called patient at home informed patient to stop coumadin and recheck INR 04/29/13

## 2013-04-30 ENCOUNTER — Telehealth: Payer: Self-pay | Admitting: *Deleted

## 2013-04-30 NOTE — Telephone Encounter (Signed)
Just received this report FYI [facility] INR 3.5  PT 36.1

## 2013-04-30 NOTE — Telephone Encounter (Signed)
Patient notified as requested. 

## 2013-04-30 NOTE — Telephone Encounter (Signed)
Left message for patient to call office.  

## 2013-04-30 NOTE — Telephone Encounter (Signed)
Ok.   NO coumadin until Saturday,  Resume regular regimen on Saturday and recheck INR next Thursday

## 2013-05-08 ENCOUNTER — Telehealth: Payer: Self-pay | Admitting: Internal Medicine

## 2013-05-08 NOTE — Telephone Encounter (Signed)
Patient notified of results and to repeat INR in one week.

## 2013-05-08 NOTE — Telephone Encounter (Signed)
INR is low but he has only been on coumadin for 4 days.  Continue regular regimen and repeat in 1 week

## 2013-05-13 LAB — PROTIME-INR

## 2013-05-14 ENCOUNTER — Encounter: Payer: Self-pay | Admitting: Internal Medicine

## 2013-05-14 ENCOUNTER — Ambulatory Visit (INDEPENDENT_AMBULATORY_CARE_PROVIDER_SITE_OTHER): Payer: Medicare Other | Admitting: Internal Medicine

## 2013-05-14 ENCOUNTER — Telehealth: Payer: Self-pay | Admitting: Internal Medicine

## 2013-05-14 VITALS — BP 112/52 | HR 59 | Temp 97.8°F | Resp 14 | Wt 147.2 lb

## 2013-05-14 DIAGNOSIS — M5430 Sciatica, unspecified side: Secondary | ICD-10-CM

## 2013-05-14 DIAGNOSIS — M810 Age-related osteoporosis without current pathological fracture: Secondary | ICD-10-CM

## 2013-05-14 DIAGNOSIS — E349 Endocrine disorder, unspecified: Secondary | ICD-10-CM

## 2013-05-14 DIAGNOSIS — E291 Testicular hypofunction: Secondary | ICD-10-CM

## 2013-05-14 DIAGNOSIS — M5431 Sciatica, right side: Secondary | ICD-10-CM

## 2013-05-14 DIAGNOSIS — E785 Hyperlipidemia, unspecified: Secondary | ICD-10-CM

## 2013-05-14 DIAGNOSIS — I10 Essential (primary) hypertension: Secondary | ICD-10-CM

## 2013-05-14 NOTE — Patient Instructions (Addendum)
  We will repeat your fasting lipids with a coumadin  Check in 4 week sor more   You are taking a statin now!! atorvastatin is generic Lipitor   Continue 3 mg of coumadin 5 days per week and 6 mg on Mon and Thursday  Until you hear from [place].    You can keep the testosterone injections every 21 days and we will repeat DEXA scan this fall to see if there has been change in your bone density

## 2013-05-14 NOTE — Assessment & Plan Note (Addendum)
Was started on lipitor by Dr  Mariah Milling 3 weeks ago and is tolerating the medication  with no new muscle apin .

## 2013-05-14 NOTE — Progress Notes (Signed)
Patient ID: Kevin [name], male   DOB: 16-Sep-1925, 79 y.o.   MRN: [id number]  Patient Active Problem List   Diagnosis Date Noted  . Hyperlipidemia 04/27/2013  . Sciatica of right side 03/16/2013  . Other and unspecified hyperlipidemia 03/16/2013  . Anemia, iron deficiency 09/15/2012  . Leg pain, right 09/15/2012  . Osteoporosis, senile 09/15/2012  . Atrial flutter 06/26/2012  . Esophageal reflux 02/21/2012  . Depression 12/24/2011  . Testosterone deficiency 09/23/2011  . Hypertension 08/25/2011  . Bradycardia 08/25/2011  . Neuropathy of lower extremity 08/25/2011  . Atrial fibrillation   . Chronic headaches   . History of mitral valve replacement with mechanical valve   . Long term (current) use of anticoagulants 08/22/2011    Subjective:  CC:   Chief Complaint  Patient presents with  . Follow-up     3 month    HPI:   Kevin [name] a 79 y.o. male who presents for 3 month follow up on chronic conditions including hypertension , Atrial fibrillation and low testosterone with osteoporosis, and chronic right hip pain.   He heels fine except he has been having persistent hip and sciatic pain on the right.  Managed with manipulation by his DO Motika.   He has some kind of osteopathic treatment  2 weeks ago which involve massage and pressure application to the lateral hip which transiently resolved his pain  but the pain returned after swimming.Marland Kitchen  He has changed cardiolgists to Dr. Mariah Milling and has seen him recentlyand was prescribed  statin. Marland Kitchen   Past Medical History  Diagnosis Date  . Atrial fibrillation   . Sciatica   . Chronic headaches started age 47  . History of mitral valve replacement with mechanical valve   . Sciatica   . Osteoporosis     secondary to low testoerone  . Cancer     melanoma- right ear  . GERD (gastroesophageal reflux disease)   . Glaucoma   . Hypertension     Past Surgical History  Procedure Laterality Date  . Nasal septum surgery  1963  .  Cataract extraction, bilateral      x 2  . Melanoma excision      right ear  . Mitral valve replacement  1994  . Hernia repair      x 2  . Cardioversion  2013       The following portions of the patient's history were reviewed and updated as appropriate: Allergies, current medications, and problem list.    Review of Systems:   Patient denies headache, fevers, malaise, unintentional weight loss, skin rash, eye pain, sinus congestion and sinus pain, sore throat, dysphagia,  hemoptysis , cough, dyspnea, wheezing, chest pain, palpitations, orthopnea, edema, abdominal pain, nausea, melena, diarrhea, constipation, flank pain, dysuria, hematuria, urinary  Frequency, nocturia, numbness, tingling, seizures,  Focal weakness, Loss of consciousness,  Tremor, insomnia, depression, anxiety, and suicidal ideation.     History   Social History  . Marital Status: Widowed    Spouse Name: N/A    Number of Children: N/A  . Years of Education: N/A   Occupational History  . Not on file.   Social History Main Topics  . Smoking status: Never Smoker   . Smokeless tobacco: Never Used  . Alcohol Use: No  . Drug Use: No  . Sexually Active: Not on file   Other Topics Concern  . Not on file   Social History Narrative   Exercise; light 3 x week  Lives alone, widowed at [facility]          Objective:  BP 112/52  Pulse 59  Temp(Src) 97.8 F (36.6 C) (Oral)  Resp 14  Wt 147 lb 4 oz (66.792 kg)  BMI 23.06 kg/m2  SpO2 97%  General appearance: alert, cooperative and appears stated age Ears: normal TM's and external ear canals both ears Throat: lips, mucosa, and tongue normal; teeth and gums normal Neck: no adenopathy, no carotid bruit, supple, symmetrical, trachea midline and thyroid not enlarged, symmetric, no tenderness/mass/nodules Back: symmetric, no curvature. ROM normal. No CVA tenderness. Lungs: clear to auscultation bilaterally Heart: regular rate and rhythm, S1, S2 normal, no  murmur, click, rub or gallop Abdomen: soft, non-tender; bowel sounds normal; no masses,  no organomegaly Pulses: 2+ and symmetric Skin: Skin color, texture, turgor normal. No rashes or lesions Lymph nodes: Cervical, supraclavicular, and axillary nodes normal.  Assessment and Plan:  Other and unspecified hyperlipidemia Was started on lipitor by Dr  Mariah Milling 3 weeks ago and is tolerating the medication  with no new muscle apin .   Testosterone deficiency We have supplemented his low testosterone level given his concurrent diagnosis of osteoporosis. We reduced his dosing to every 3 weeks in January due to an elevated testosterone level   Osteoporosis, senile seconday to hypotestosteronism>  He has not had a DEXA scan in over 3 years.  We will repeat this year.  Sciatica of right side Caused by degenerative changes and scoliosis, under treatment by Dr Marc Morgans.   Hypertension Well controlled on current regimen. Renal function stable, no changes today.   Updated Medication List Outpatient Encounter Prescriptions as of 05/14/2013  Medication Sig Dispense Refill  . 5-Hydroxytryptophan (5-HTP PO) Take by mouth daily.      . ALPHA LIPOIC ACID PO Take by mouth daily.      Marland Kitchen amLODipine (NORVASC) 10 MG tablet Take 1 tablet (10 mg total) by mouth daily.  30 tablet  6  . atorvastatin (LIPITOR) 10 MG tablet Take 1 tablet (10 mg total) by mouth daily.  90 tablet  3  . brimonidine (ALPHAGAN) 0.15 % ophthalmic solution 2 (two) times daily.       . Calcium-Magnesium-Vitamin D (CALCIUM MAGNESIUM PO) Take by mouth 2 (two) times daily.      . DHA-EPA-Vit B6-B12-Folic Acid (CARDIOVID PLUS) CAPS Take 2 capsules by mouth 2 (two) times daily.      . ergocalciferol (VITAMIN D2) 50000 UNITS capsule Take 50,000 Units by mouth once a week.      . ferrous sulfate 324 (65 FE) MG TBEC Take 1 tablet (325 mg total) by mouth daily with lunch.  30 tablet  3  . Guggulipid (GUGULIPID PO) Take by mouth daily.      .  L-GLUTAMINE PO Take by mouth.        . latanoprost (XALATAN) 0.005 % ophthalmic solution daily.       Marland Kitchen losartan (COZAAR) 100 MG tablet TAKE ONE TABLET BY MOUTH DAILY  90 tablet  3  . LUMIGAN 0.01 % SOLN 1 drop at bedtime.       . metoprolol tartrate (LOPRESSOR) 25 MG tablet Take 1 tablet (25 mg total) by mouth 2 (two) times daily.  60 tablet  6  . Multiple Vitamin (MULTIVITAMIN) tablet Take 1 tablet by mouth daily.        . NON FORMULARY Memory Essentials 2 tablets BID.      Marland Kitchen NON FORMULARY Pro Liver Milk Thistle and Acetyl-Cysteine 1  tablet daily.      . Nutritional Supplements (MELATONIN PO) Take by mouth.      . Omega-3 Fatty Acids (OMEGA 3 PO) Take by mouth.        Marland Kitchen omeprazole (PRILOSEC) 40 MG capsule daily.       [name] Calcium 500 MG TABS Take two by mouth daily       . Red Yeast Rice Extract (RED YEAST RICE PO) Take by mouth.        . SYRINGE-NEEDLE, DISP, 3 ML (SAFETY-LOK 3CC SYR 22GX1.5") 22G X 1-1/2" 3 ML MISC 2 ml's monthly-given 1 shot monthly       . testosterone cypionate (DEPOTESTOTERONE CYPIONATE) 200 MG/ML injection Inject 0.5 mLs (100 mg total) into the muscle every 28 (twenty-eight) days.  10 mL  1  . triamcinolone cream (KENALOG) 0.1 % daily.       . TURMERIC PO Take by mouth.        . warfarin (COUMADIN) 3 MG tablet Take 1 tablet (3 mg total) by mouth daily.  60 tablet  2   No facility-administered encounter medications on file as of 05/14/2013.     Orders Placed This Encounter  Procedures  . DG Bone Density    Return in about 6 months (around 11/13/2013).

## 2013-05-14 NOTE — Telephone Encounter (Signed)
Patient wanting lab order for his PT/INR and any other lab work that he has to have done at this time sent to [facility] for the nurse to draw his labs. He is wanting it done on  7.15.14.

## 2013-05-15 NOTE — Telephone Encounter (Signed)
Please advise 

## 2013-05-16 ENCOUNTER — Encounter: Payer: Self-pay | Admitting: Internal Medicine

## 2013-05-16 NOTE — Assessment & Plan Note (Signed)
seconday to hypotestosteronism>  He has not had a DEXA scan in over 3 years.  We will repeat this year.

## 2013-05-16 NOTE — Assessment & Plan Note (Signed)
Caused by degenerative changes and scoliosis, under treatment by Dr Marc Morgans.

## 2013-05-16 NOTE — Assessment & Plan Note (Signed)
Well controlled on current regimen. Renal function stable, no changes today. 

## 2013-05-16 NOTE — Assessment & Plan Note (Signed)
We have supplemented his low testosterone level given his concurrent diagnosis of osteoporosis. We reduced his dosing to every 3 weeks in January due to an elevated testosterone level

## 2013-05-17 NOTE — Telephone Encounter (Signed)
Letter drafted and on printer. If not there please reprint for signature.

## 2013-05-18 ENCOUNTER — Encounter: Payer: Self-pay | Admitting: *Deleted

## 2013-05-18 NOTE — Telephone Encounter (Signed)
Order not on printer and no new order for labs in epic.

## 2013-05-18 NOTE — Telephone Encounter (Signed)
Printed, signed, and faxed as directed.

## 2013-05-19 ENCOUNTER — Other Ambulatory Visit: Payer: Self-pay | Admitting: Internal Medicine

## 2013-06-11 ENCOUNTER — Encounter: Payer: Self-pay | Admitting: Internal Medicine

## 2013-06-16 ENCOUNTER — Ambulatory Visit: Payer: Medicare Other | Admitting: Internal Medicine

## 2013-06-17 ENCOUNTER — Telehealth: Payer: Self-pay | Admitting: Internal Medicine

## 2013-06-17 NOTE — Telephone Encounter (Signed)
All labs normal,. Including thyroid function and cholesterol  Coumadin level is upper limit of therapeutic,  Continue current regimen and repeat PT/INR in 2 weeks

## 2013-06-18 NOTE — Telephone Encounter (Signed)
Patient notified of labs and is requesting copy.

## 2013-06-18 NOTE — Telephone Encounter (Signed)
Left message for pt to return my call.

## 2013-06-19 ENCOUNTER — Other Ambulatory Visit: Payer: Self-pay | Admitting: Internal Medicine

## 2013-06-22 ENCOUNTER — Telehealth: Payer: Self-pay | Admitting: *Deleted

## 2013-06-22 ENCOUNTER — Other Ambulatory Visit: Payer: Medicare Other

## 2013-06-22 NOTE — Telephone Encounter (Signed)
Called [phone] for prior authorization on the Testost CYP 200 Mg, form is being faxed over

## 2013-06-23 ENCOUNTER — Other Ambulatory Visit: Payer: Medicare Other

## 2013-06-25 ENCOUNTER — Encounter: Payer: Self-pay | Admitting: Internal Medicine

## 2013-06-25 NOTE — Telephone Encounter (Signed)
Received a fax from [employer], Testosterone cypionate 200 mg/mL was APPROVED

## 2013-07-15 ENCOUNTER — Telehealth: Payer: Self-pay | Admitting: Internal Medicine

## 2013-07-15 DIAGNOSIS — Z7901 Long term (current) use of anticoagulants: Secondary | ICD-10-CM

## 2013-07-15 MED ORDER — WARFARIN SODIUM 3 MG PO TABS: 3.0000 mg | ORAL_TABLET | Freq: Every day | ORAL | Status: DC

## 2013-07-15 NOTE — Telephone Encounter (Signed)
Resume coumadin on Friday 6 mg on Friday only,  3 mg all other days,  Repeat INr in 2 weeks

## 2013-07-15 NOTE — Telephone Encounter (Signed)
Current coumadin regimen is 6 mg on Monday and Thursday and 3 mg all other days. New supplement is called Cataplex contains B6, thymine, and Niacin. Advised patient no coumadin for two days and not to take supplement until advised otherwise.

## 2013-07-15 NOTE — Assessment & Plan Note (Signed)
Dose adjustment needed for INR 4.1  Suspend couadin x 2 days,  *8/6 and 8/7) 6 mg on  Fridays only ,  3 mg all other days.  Repeat INR  2weeks

## 2013-07-15 NOTE — Telephone Encounter (Signed)
INR is too high at 4.1.  No coumadin for 2 days.  Confirm regimen and find out if he has started taking any new supplements that might have caused it to get so high

## 2013-07-16 NOTE — Telephone Encounter (Signed)
Patient notified as requested. 

## 2013-07-27 ENCOUNTER — Encounter: Payer: Self-pay | Admitting: Internal Medicine

## 2013-07-30 ENCOUNTER — Encounter: Payer: Self-pay | Admitting: Internal Medicine

## 2013-07-30 LAB — PROTIME-INR: INR: 2.8 — AB (ref 0.9–1.1)

## 2013-07-31 ENCOUNTER — Telehealth: Payer: Self-pay | Admitting: Internal Medicine

## 2013-07-31 DIAGNOSIS — Z7901 Long term (current) use of anticoagulants: Secondary | ICD-10-CM

## 2013-07-31 NOTE — Telephone Encounter (Signed)
Coumadin level is therapeutic,  Continue current regimen and repeat PT/INR in one month 

## 2013-08-03 NOTE — Telephone Encounter (Signed)
Patient notified and voiced understanding.

## 2013-08-26 LAB — PROTIME-INR: INR: 3.1 — AB (ref 0.9–1.1)

## 2013-08-27 ENCOUNTER — Telehealth: Payer: Self-pay | Admitting: Internal Medicine

## 2013-08-27 NOTE — Telephone Encounter (Signed)
Coumadin level is therapeutic,  Continue current regimen and repeat PT/INR in one month 

## 2013-08-28 NOTE — Telephone Encounter (Signed)
Patient notified as requested. 

## 2013-08-28 NOTE — Telephone Encounter (Signed)
Left message for pt to return my call.

## 2013-09-23 ENCOUNTER — Other Ambulatory Visit: Payer: Self-pay | Admitting: Internal Medicine

## 2013-09-25 ENCOUNTER — Telehealth: Payer: Self-pay | Admitting: Internal Medicine

## 2013-09-25 NOTE — Telephone Encounter (Signed)
Coumadin level is therapeutic,  Continue current regimen and repeat PT/INR in one month 

## 2013-09-25 NOTE — Telephone Encounter (Signed)
Called and advised patient of results and verbalized understanding.  

## 2013-10-13 ENCOUNTER — Encounter: Payer: Self-pay | Admitting: Internal Medicine

## 2013-10-21 LAB — PROTIME-INR

## 2013-10-22 ENCOUNTER — Telehealth: Payer: Self-pay | Admitting: Internal Medicine

## 2013-10-22 ENCOUNTER — Telehealth: Payer: Self-pay | Admitting: *Deleted

## 2013-10-22 NOTE — Telephone Encounter (Signed)
Pt notified and verbalized understanding.

## 2013-10-22 NOTE — Telephone Encounter (Signed)
repeat INR one week

## 2013-10-22 NOTE — Telephone Encounter (Signed)
Coumadin level is therapeutic,  But on the high end of normal Continue current regimen and repeat PT/INR in one week

## 2013-10-22 NOTE — Telephone Encounter (Signed)
3 mg 6 days per week 6 mg one day per week , INR 3.6 in red folder.

## 2013-10-22 NOTE — Telephone Encounter (Signed)
Left message for pt to return my call.

## 2013-10-26 ENCOUNTER — Ambulatory Visit: Payer: Medicare Other | Admitting: Cardiovascular Disease

## 2013-10-30 ENCOUNTER — Telehealth: Payer: Self-pay | Admitting: Internal Medicine

## 2013-10-30 NOTE — Telephone Encounter (Signed)
INR is slightly high at 3.6  (goal is 2.5 to 3. 5)Reduce dose on Friday to 3 mg so regimen should be 3 mg daily per chart .  repeat INR in 2 weeks

## 2013-10-30 NOTE — Telephone Encounter (Signed)
Called and advised patient of results,  verbalized understanding. 

## 2013-10-30 NOTE — Telephone Encounter (Signed)
Line busy, will try back 

## 2013-11-07 ENCOUNTER — Other Ambulatory Visit: Payer: Self-pay | Admitting: Cardiovascular Disease

## 2013-11-11 ENCOUNTER — Telehealth: Payer: Self-pay | Admitting: Internal Medicine

## 2013-11-11 NOTE — Telephone Encounter (Signed)
Left message for pt to return my call.

## 2013-11-11 NOTE — Telephone Encounter (Signed)
Called and advised patient of results,  verbalized understanding. 

## 2013-11-11 NOTE — Telephone Encounter (Signed)
Coumadin level is therapeutic,  Continue current regimen and repeat PT/INR in one month 

## 2013-11-17 ENCOUNTER — Encounter (INDEPENDENT_AMBULATORY_CARE_PROVIDER_SITE_OTHER): Payer: Self-pay

## 2013-11-17 ENCOUNTER — Ambulatory Visit (INDEPENDENT_AMBULATORY_CARE_PROVIDER_SITE_OTHER): Payer: Medicare Other | Admitting: Internal Medicine

## 2013-11-17 VITALS — BP 130/76 | HR 76 | Temp 98.6°F | Wt 148.0 lb

## 2013-11-17 DIAGNOSIS — R0609 Other forms of dyspnea: Secondary | ICD-10-CM

## 2013-11-17 DIAGNOSIS — I10 Essential (primary) hypertension: Secondary | ICD-10-CM

## 2013-11-17 DIAGNOSIS — I429 Cardiomyopathy, unspecified: Secondary | ICD-10-CM

## 2013-11-17 DIAGNOSIS — I4892 Unspecified atrial flutter: Secondary | ICD-10-CM

## 2013-11-17 DIAGNOSIS — R0989 Other specified symptoms and signs involving the circulatory and respiratory systems: Secondary | ICD-10-CM

## 2013-11-17 NOTE — Progress Notes (Signed)
Pre visit review using our clinic review tool, if applicable. No additional management support is needed unless otherwise documented below in the visit note. 

## 2013-11-17 NOTE — Progress Notes (Signed)
Patient ID: Kevin [name], male   DOB: April 05, 1925, 79 y.o.   MRN: [id number]   Patient Active Problem List   Diagnosis Date Noted  . Secondary cardiomyopathy, unspecified 11/18/2013  . Hyperlipidemia 04/27/2013  . Sciatica of right side 03/16/2013  . Other and unspecified hyperlipidemia 03/16/2013  . Anemia, iron deficiency 09/15/2012  . Leg pain, right 09/15/2012  . Osteoporosis, senile 09/15/2012  . Atrial flutter 06/26/2012  . Esophageal reflux 02/21/2012  . Depression 12/24/2011  . Testosterone deficiency 09/23/2011  . Hypertension 08/25/2011  . Bradycardia 08/25/2011  . Neuropathy of lower extremity 08/25/2011  . Atrial fibrillation   . Chronic headaches   . History of mitral valve replacement with mechanical valve   . Long term (current) use of anticoagulants 08/22/2011    Subjective:  CC:   Chief Complaint  Patient presents with  . Follow-up    HPI:   Kevin [name] a 79 y.o. male who presents for follow up on chronic  issues . He has been undergoing treatment for back pain secondary to scoliosis and DDD.  Receiving  PT in [place] [name] every 3 weeks Advance therapy who has prescribed a corset .   He is wearing  a corset and is helping diminish his  pain on the right.  No MRI done   2) Gets SOB with 2 blocks of walking   No fluid retention chest pain or weight gain,  No orthopnea.  Has gotten progressively worse despite cardioverson in may for atrial flutter. Has not had a treadmill test.  In many years.  Has not been swimming or walking to any extent.     Past Medical History  Diagnosis Date  . Atrial fibrillation   . Sciatica   . Chronic headaches started age 59  . History of mitral valve replacement with mechanical valve   . Sciatica   . Osteoporosis     secondary to low testoerone  . Cancer     melanoma- right ear  . GERD (gastroesophageal reflux disease)   . Glaucoma   . Hypertension     Past Surgical History  Procedure Laterality Date   . Nasal septum surgery  1963  . Cataract extraction, bilateral      x 2  . Melanoma excision      right ear  . Mitral valve replacement  1994  . Hernia repair      x 2  . Cardioversion  2013       The following portions of the patient's history were reviewed and updated as appropriate: Allergies, current medications, and problem list.    Review of Systems:   12 Pt  review of systems was negative except those addressed in the HPI,     History   Social History  . Marital Status: Widowed    Spouse Name: N/A    Number of Children: N/A  . Years of Education: N/A   Occupational History  . Not on file.   Social History Main Topics  . Smoking status: Never Smoker   . Smokeless tobacco: Never Used  . Alcohol Use: No  . Drug Use: No  . Sexual Activity: Not on file   Other Topics Concern  . Not on file   Social History Narrative   Exercise; light 3 x week   Lives alone, widowed at [facility]          Objective:  Filed Vitals:   11/17/13 0946  BP: 130/76  Pulse: 76  Temp: 98.6 F (37 C)     General appearance: alert, cooperative and appears stated age Ears: normal TM's and external ear canals both ears Throat: lips, mucosa, and tongue normal; teeth and gums normal Neck: no adenopathy, no carotid bruit, supple, symmetrical, trachea midline and thyroid not enlarged, symmetric, no tenderness/mass/nodules Back: symmetric, no curvature. ROM normal. No CVA tenderness. Lungs: clear to auscultation bilaterally Heart: regular rate and rhythm, S1, S2 normal, no murmur, click, rub or gallop Abdomen: soft, non-tender; bowel sounds normal; no masses,  no organomegaly Pulses: 2+ and symmetric Skin: Skin color, texture, turgor normal. No rashes or lesions Lymph nodes: Cervical, supraclavicular, and axillary nodes normal.  Assessment and Plan:  Atrial flutter S/p ablation May 204.  Exam suggests sinus rhythm today   Secondary cardiomyopathy, unspecified .likely  the cause of his exertional dyspnea .  Will need stress testing prior  to resuming exercise program   Hypertension Well controlled on current regimen. Renal function stable, no changes today.   Updated Medication List Outpatient Encounter Prescriptions as of 11/17/2013  Medication Sig  . 5-Hydroxytryptophan (5-HTP PO) Take by mouth daily.  . ALPHA LIPOIC ACID PO Take by mouth daily.  Marland Kitchen amLODipine (NORVASC) 10 MG tablet Take 1 tablet (10 mg total) by mouth daily.  . Ascorbic Acid (VITAMIN C) 100 MG tablet Take 100 mg by mouth daily.  Marland Kitchen atorvastatin (LIPITOR) 10 MG tablet Take 1 tablet (10 mg total) by mouth daily.  . brimonidine (ALPHAGAN) 0.15 % ophthalmic solution 2 (two) times daily.   . Calcium-Magnesium-Vitamin D (CALCIUM MAGNESIUM PO) Take by mouth 2 (two) times daily.  . DHA-EPA-Vit B6-B12-Folic Acid (CARDIOVID PLUS) CAPS Take 2 capsules by mouth 2 (two) times daily.  . ergocalciferol (VITAMIN D2) 50000 UNITS capsule Take 50,000 Units by mouth once a week.  . ferrous sulfate 324 (65 FE) MG TBEC Take 1 tablet (325 mg total) by mouth daily with lunch.  . Guggulipid (GUGULIPID PO) Take by mouth daily.  . L-GLUTAMINE PO Take by mouth.    . latanoprost (XALATAN) 0.005 % ophthalmic solution daily.   Marland Kitchen losartan (COZAAR) 100 MG tablet TAKE 1 TABLET BY MOUTH EVERY DAY  . LUMIGAN 0.01 % SOLN 1 drop at bedtime.   . metoprolol tartrate (LOPRESSOR) 25 MG tablet TAKE 1 TABLET BY MOUTH TWICE DAILY  . Multiple Vitamin (MULTIVITAMIN) tablet Take 1 tablet by mouth daily.    . NON FORMULARY Memory Essentials 2 tablets BID.  Marland Kitchen NON FORMULARY Pro Liver Milk Thistle and Acetyl-Cysteine 1 tablet daily.  . Nutritional Supplements (MELATONIN PO) Take by mouth.  . Omega-3 Fatty Acids (OMEGA 3 PO) Take by mouth.    Marland Kitchen omeprazole (PRILOSEC) 40 MG capsule daily.   [name] Calcium 500 MG TABS Take two by mouth daily   . Red Yeast Rice Extract (RED YEAST RICE PO) Take by mouth.    . RUTIN PO Take by  mouth.  . SYRINGE-NEEDLE, DISP, 3 ML (SAFETY-LOK 3CC SYR 22GX1.5") 22G X 1-1/2" 3 ML MISC 2 ml's monthly-given 1 shot monthly   . testosterone cypionate (DEPOTESTOTERONE CYPIONATE) 200 MG/ML injection INJECT 0.5 ML IN THE MUSCLE EVERY 28 DAYS.  Marland Kitchen triamcinolone cream (KENALOG) 0.1 % daily.   . TURMERIC PO Take by mouth.    . warfarin (COUMADIN) 3 MG tablet Take 1 tablet (3 mg total) by mouth daily. 6 mg on Friday     Orders Placed This Encounter  Procedures  . Ambulatory referral to Cardiology  No Follow-up on file.

## 2013-11-18 ENCOUNTER — Encounter: Payer: Self-pay | Admitting: Internal Medicine

## 2013-11-18 DIAGNOSIS — I429 Cardiomyopathy, unspecified: Secondary | ICD-10-CM | POA: Insufficient documentation

## 2013-11-18 NOTE — Assessment & Plan Note (Signed)
Well controlled on current regimen. Renal function stable, no changes today. 

## 2013-11-18 NOTE — Assessment & Plan Note (Signed)
S/p ablation May 204.  Exam suggests sinus rhythm today

## 2013-11-18 NOTE — Assessment & Plan Note (Signed)
.  likely the cause of his exertional dyspnea .  Will need stress testing prior  to resuming exercise program

## 2013-11-20 ENCOUNTER — Encounter: Payer: Self-pay | Admitting: Cardiovascular Disease

## 2013-11-20 ENCOUNTER — Ambulatory Visit (INDEPENDENT_AMBULATORY_CARE_PROVIDER_SITE_OTHER): Payer: Medicare Other | Admitting: Cardiovascular Disease

## 2013-11-20 VITALS — BP 110/60 | HR 55 | Ht 68.0 in | Wt 148.8 lb

## 2013-11-20 DIAGNOSIS — I428 Other cardiomyopathies: Secondary | ICD-10-CM

## 2013-11-20 DIAGNOSIS — I441 Atrioventricular block, second degree: Secondary | ICD-10-CM

## 2013-11-20 DIAGNOSIS — R262 Difficulty in walking, not elsewhere classified: Secondary | ICD-10-CM

## 2013-11-20 DIAGNOSIS — R0602 Shortness of breath: Secondary | ICD-10-CM

## 2013-11-20 DIAGNOSIS — I4892 Unspecified atrial flutter: Secondary | ICD-10-CM

## 2013-11-20 DIAGNOSIS — Z952 Presence of prosthetic heart valve: Secondary | ICD-10-CM

## 2013-11-20 DIAGNOSIS — I429 Cardiomyopathy, unspecified: Secondary | ICD-10-CM

## 2013-11-20 DIAGNOSIS — Z954 Presence of other heart-valve replacement: Secondary | ICD-10-CM

## 2013-11-20 DIAGNOSIS — I420 Dilated cardiomyopathy: Secondary | ICD-10-CM

## 2013-11-20 DIAGNOSIS — I4891 Unspecified atrial fibrillation: Secondary | ICD-10-CM

## 2013-11-20 NOTE — Progress Notes (Signed)
Patient ID: Kevin [name], male    DOB: March 16, 1925, 79 y.o.   MRN: [id number]  HPI Comments: Kevin [name] is a very pleasant 79 year old gentleman, patient of Dr. Darrick [name], also seen by Dr. Juliann [name], with a history of mitral valve replacement with [employer] Jude valve in 1994, history of atrial fibrillation, mild restriction on pulmonary function tests who presented initially with worsening shortness of breath, found to be in atrial flutter.  cardioversion was performed and he converted to normal sinus rhythm with one shock.  Notes indicate recent echocardiogram may 2014 showing ejection fraction 35-40%, left atrial enlargement. Previous echocardiogram showed ejection fraction 50% This was performed by Dr. Juliann [name] in followup. Recent seen by Dr. Darrick [name] for worsening shortness of breath. He reports this has been ongoing over the past year. No recent chest pain, no PND or orthopnea, no leg edema.  No palpitations. He is sedentary. He reports that he stopped his Lipitor as it made him feel nervous, he also had sweating.  Previous  CT scan did not show any PE Chest x-ray showed prominent pulmonary arteries otherwise no prominent new findings He had a transesophageal echo 04/30/2012 that was essentially normal documenting normal functioning mitral valve  EKGs were reviewed with atrial flutter seen in 2008, Atrial flutter with slow ventricular response rate of 50 seen 06/13/2012 Atrial flutter with slow ventricular response rate 47 seen June 03 2012  EKG on his last office visit showed normal sinus rhythm with no significant ST-T wave changes, rate 60 beats per minute EKG today shows normal sinus rhythm with rate 85 beats per minute, second-degree AV block type I noted   Outpatient Encounter Prescriptions as of 11/20/2013  Medication Sig  . 5-Hydroxytryptophan (5-HTP PO) Take by mouth daily.  . ALPHA LIPOIC ACID PO Take by mouth daily.  Marland Kitchen amLODipine (NORVASC) 10 MG tablet Take 1 tablet (10 mg total) by  mouth daily.  . Ascorbic Acid (VITAMIN C) 100 MG tablet Take 100 mg by mouth daily.  Marland Kitchen atorvastatin (LIPITOR) 10 MG tablet Take 1 tablet (10 mg total) by mouth daily.  . brimonidine (ALPHAGAN) 0.15 % ophthalmic solution 2 (two) times daily.   . Calcium-Magnesium-Vitamin D (CALCIUM MAGNESIUM PO) Take by mouth 2 (two) times daily.  . DHA-EPA-Vit B6-B12-Folic Acid (CARDIOVID PLUS) CAPS Take 2 capsules by mouth 2 (two) times daily.  . ergocalciferol (VITAMIN D2) 50000 UNITS capsule Take 50,000 Units by mouth once a week.  . ferrous sulfate 324 (65 FE) MG TBEC Take 1 tablet (325 mg total) by mouth daily with lunch.  . Guggulipid (GUGULIPID PO) Take by mouth daily.  . L-GLUTAMINE PO Take by mouth.    . latanoprost (XALATAN) 0.005 % ophthalmic solution daily.   Marland Kitchen losartan (COZAAR) 100 MG tablet TAKE 1 TABLET BY MOUTH EVERY DAY  . LUMIGAN 0.01 % SOLN 1 drop at bedtime.   . metoprolol tartrate (LOPRESSOR) 25 MG tablet TAKE 1 TABLET BY MOUTH TWICE DAILY  . Multiple Vitamin (MULTIVITAMIN) tablet Take 1 tablet by mouth daily.    . NON FORMULARY Memory Essentials 2 tablets BID.  Marland Kitchen NON FORMULARY Pro Liver Milk Thistle and Acetyl-Cysteine 1 tablet daily.  . Nutritional Supplements (MELATONIN PO) Take by mouth.  . Omega-3 Fatty Acids (OMEGA 3 PO) Take by mouth.    Marland Kitchen omeprazole (PRILOSEC) 40 MG capsule daily.   [name] Calcium 500 MG TABS Take two by mouth daily   . Red Yeast Rice Extract (RED YEAST RICE PO) Take by mouth.    Marland Kitchen  RUTIN PO Take by mouth.  . SYRINGE-NEEDLE, DISP, 3 ML (SAFETY-LOK 3CC SYR 22GX1.5") 22G X 1-1/2" 3 ML MISC 2 ml's monthly-given 1 shot monthly   . testosterone cypionate (DEPOTESTOTERONE CYPIONATE) 200 MG/ML injection INJECT 0.5 ML IN THE MUSCLE EVERY 28 DAYS.  Marland Kitchen triamcinolone cream (KENALOG) 0.1 % daily.   . TURMERIC PO Take by mouth.    . warfarin (COUMADIN) 3 MG tablet Take 1 tablet (3 mg total) by mouth daily. 6 mg on Friday  . ketoconazole (NIZORAL) 2 % cream Apply 1  application topically 2 (two) times daily.     Review of Systems  Constitutional: Negative.   HENT: Negative.   Eyes: Negative.   Respiratory: Positive for shortness of breath.   Cardiovascular: Negative.   Gastrointestinal: Negative.   Endocrine: Negative.   Musculoskeletal: Positive for myalgias.       Right thigh pain  Skin: Negative.   Allergic/Immunologic: Negative.   Neurological: Negative.   Hematological: Negative.   Psychiatric/Behavioral: Negative.   All other systems reviewed and are negative.    BP 110/60  Pulse 55  Ht 5\' 8"  (1.727 m)  Wt 148 lb 12 oz (67.473 kg)  BMI 22.62 kg/m2  Physical Exam  Nursing note and vitals reviewed. Constitutional: He is oriented to person, place, and time. He appears well-developed and well-nourished.  HENT:  Head: Normocephalic.  Nose: Nose normal.  Mouth/Throat: Oropharynx is clear and moist.  Eyes: Conjunctivae are normal. Pupils are equal, round, and reactive to light.  Neck: Normal range of motion. Neck supple. No JVD present.  Cardiovascular: Regular rhythm, S1 normal, S2 normal, normal heart sounds and intact distal pulses.  Bradycardia present.  Exam reveals no gallop and no friction rub.   No murmur heard. Pulmonary/Chest: Effort normal and breath sounds normal. No respiratory distress. He has no wheezes. He has no rales. He exhibits no tenderness.  Abdominal: Soft. Bowel sounds are normal. He exhibits no distension. There is no tenderness.  Musculoskeletal: Normal range of motion. He exhibits no edema and no tenderness.  Lymphadenopathy:    He has no cervical adenopathy.  Neurological: He is alert and oriented to person, place, and time. Coordination normal.  Skin: Skin is warm and dry. No rash noted. No erythema.  Psychiatric: He has a normal mood and affect. His behavior is normal. Judgment and thought content normal.      Assessment and Plan

## 2013-11-20 NOTE — Assessment & Plan Note (Signed)
He is maintaining normal sinus rhythm. No changes to his medications 

## 2013-11-20 NOTE — Assessment & Plan Note (Signed)
Second degree AV block type I noted on EKG today in the office. This is likely not the cause of his shortness of breath which has been progressive over the past year.

## 2013-11-20 NOTE — Assessment & Plan Note (Signed)
Prior echocardiogram showing normal ejection fraction, now with report from outside facility with ejection fraction 35-40%. Stress test has been ordered. He is unable to treadmill given shortness of breath and weak legs. We'll schedule a pharmacologic study

## 2013-11-20 NOTE — Patient Instructions (Addendum)
You are doing well. No medication changes were made.  We will schedule you for a lexiscan myoview for shortness of breath Hold the amlodipine and metoprolol the night before and day of the  No caffeine 24 hours before the test No food the morning of the test  Please call us if you have new issues that need to be addressed before your next appt.  Your physician wants you to follow-up in: 12 months.  You will receive a reminder letter in the mail two months in advance. If you don't receive a letter, please call our office to schedule the follow-up appointment.  [facility] MYOVIEW  Your caregiver has ordered a Stress Test with nuclear imaging. The purpose of this test is to evaluate the blood supply to your heart muscle. This procedure is referred to as a "Non-Invasive Stress Test." This is because other than having an IV started in your vein, nothing is inserted or "invades" your body. Cardiac stress tests are done to find areas of poor blood flow to the heart by determining the extent of coronary artery disease (CAD). Some patients exercise on a treadmill, which naturally increases the blood flow to your heart, while others who are  unable to walk on a treadmill due to physical limitations have a pharmacologic/chemical stress agent called Lexiscan . This medicine will mimic walking on a treadmill by temporarily increasing your coronary blood flow.   Please note: these test may take anywhere between 2-4 hours to complete  PLEASE REPORT TO [facility]TR[facility]  THE VOLUNTEERS AT THE FIRST DESK WILL DIRECT YOU WHERE TO GO  Date of Procedure:_____Thursday, Dec 18_________________  Arrival Time for Procedure:_________7:00am_______________  Instructions regarding medication:   __X_ : Hold amlodipine and metoprolol the night before and morning of procedure    PLEASE NOTIFY THE OFFICE AT LEAST 24 HOURS IN ADVANCE IF YOU ARE UNABLE TO KEEP YOUR APPOINTMENT.  [phone] AND  PLEASE NOTIFY  NUCLEAR MEDICINE AT [facility] AT LEAST 24 HOURS IN ADVANCE IF YOU ARE UNABLE TO KEEP YOUR APPOINTMENT. [phone]  How to prepare for your Myoview test:  1. Do not eat or drink after midnight 2. No caffeine for 24 hours prior to test 3. No smoking 24 hours prior to test. 4. Your medication may be taken with water.  If your doctor stopped a medication because of this test, do not take that medication. 5. Ladies, please do not wear dresses.  Skirts or pants are appropriate. Please wear a short sleeve shirt. 6. No perfume, cologne or lotion. 7. Wear comfortable walking shoes. No heels!

## 2013-11-20 NOTE — Assessment & Plan Note (Signed)
Prior echocardiogram of his mitral valve done by our office showed well functioning valve prosthetic

## 2013-11-25 ENCOUNTER — Encounter: Payer: Self-pay | Admitting: Internal Medicine

## 2013-11-26 ENCOUNTER — Ambulatory Visit: Payer: Self-pay | Admitting: Cardiovascular Disease

## 2013-11-26 DIAGNOSIS — I4891 Unspecified atrial fibrillation: Secondary | ICD-10-CM

## 2013-11-26 DIAGNOSIS — R0602 Shortness of breath: Secondary | ICD-10-CM

## 2013-11-27 ENCOUNTER — Other Ambulatory Visit: Payer: Self-pay

## 2013-11-27 DIAGNOSIS — R262 Difficulty in walking, not elsewhere classified: Secondary | ICD-10-CM

## 2013-11-27 DIAGNOSIS — R0602 Shortness of breath: Secondary | ICD-10-CM

## 2013-11-27 DIAGNOSIS — I420 Dilated cardiomyopathy: Secondary | ICD-10-CM

## 2013-11-27 DIAGNOSIS — I4891 Unspecified atrial fibrillation: Secondary | ICD-10-CM

## 2013-12-14 ENCOUNTER — Telehealth: Payer: Self-pay | Admitting: Internal Medicine

## 2013-12-14 NOTE — Telephone Encounter (Signed)
Coumadin level is therapeutic,  INR is 3.2  Continue current regimen and repeat PT/INR in one month./

## 2013-12-15 NOTE — Telephone Encounter (Signed)
Patient notified as requested. 

## 2013-12-21 ENCOUNTER — Other Ambulatory Visit: Payer: Self-pay | Admitting: Internal Medicine

## 2014-01-05 ENCOUNTER — Encounter: Payer: Self-pay | Admitting: Internal Medicine

## 2014-01-07 LAB — PROTIME-INR: INR: 2.8 — AB (ref 0.9–1.1)

## 2014-01-09 ENCOUNTER — Other Ambulatory Visit: Payer: Self-pay | Admitting: Internal Medicine

## 2014-01-14 ENCOUNTER — Telehealth: Payer: Self-pay | Admitting: Internal Medicine

## 2014-01-14 NOTE — Telephone Encounter (Signed)
Coumadin level is therapeutic,  Continue current regimen and repeat PT/INR in one month 

## 2014-01-14 NOTE — Telephone Encounter (Signed)
Patient notified as requested and voiced understanding 

## 2014-02-02 ENCOUNTER — Other Ambulatory Visit: Payer: Self-pay | Admitting: Cardiovascular Disease

## 2014-02-03 ENCOUNTER — Other Ambulatory Visit: Payer: Self-pay | Admitting: *Deleted

## 2014-02-03 MED ORDER — METOPROLOL TARTRATE 25 MG PO TABS: ORAL_TABLET | ORAL | Status: DC

## 2014-02-03 NOTE — Telephone Encounter (Signed)
Requested Prescriptions   Signed Prescriptions Disp Refills  . metoprolol tartrate (LOPRESSOR) 25 MG tablet 60 tablet 6    Sig: TAKE 1 TABLET BY MOUTH TWICE DAILY    Authorizing Provider: Minna Merritts    Ordering User: Britt Bottom

## 2014-02-07 LAB — PROTIME-INR

## 2014-02-08 ENCOUNTER — Telehealth: Payer: Self-pay | Admitting: Internal Medicine

## 2014-02-08 NOTE — Telephone Encounter (Signed)
Regimen 3 mg 6 days seventh day 6 mg /  No new supplements, please advise, Dr. Derrel Nip gave verbal for 3 mg daily,Patient notified

## 2014-02-08 NOTE — Telephone Encounter (Signed)
Coumadin level is  Slightly high but may not need adjustment of dose .    Please confirm that he has not started any new supplements

## 2014-02-14 ENCOUNTER — Other Ambulatory Visit: Payer: Self-pay | Admitting: Internal Medicine

## 2014-02-15 ENCOUNTER — Ambulatory Visit (INDEPENDENT_AMBULATORY_CARE_PROVIDER_SITE_OTHER): Payer: Medicare Other | Admitting: Internal Medicine

## 2014-02-15 ENCOUNTER — Encounter: Payer: Self-pay | Admitting: Internal Medicine

## 2014-02-15 VITALS — BP 102/50 | HR 45 | Temp 97.6°F | Resp 16 | Wt 148.8 lb

## 2014-02-15 DIAGNOSIS — E785 Hyperlipidemia, unspecified: Secondary | ICD-10-CM

## 2014-02-15 DIAGNOSIS — K219 Gastro-esophageal reflux disease without esophagitis: Secondary | ICD-10-CM

## 2014-02-15 DIAGNOSIS — I4891 Unspecified atrial fibrillation: Secondary | ICD-10-CM

## 2014-02-15 DIAGNOSIS — I429 Cardiomyopathy, unspecified: Secondary | ICD-10-CM

## 2014-02-15 DIAGNOSIS — R21 Rash and other nonspecific skin eruption: Secondary | ICD-10-CM

## 2014-02-15 DIAGNOSIS — I10 Essential (primary) hypertension: Secondary | ICD-10-CM

## 2014-02-15 NOTE — Assessment & Plan Note (Signed)
Well controlled on current regimen. Renal function stable, no changes today. 

## 2014-02-15 NOTE — Progress Notes (Signed)
Patient ID: Kevin [name], male   DOB: 01-15-1925, 80 y.o.   MRN: [id number]  Patient Active Problem List   Diagnosis Date Noted  . Macular rash 02/15/2014  . Second degree AV block, Mobitz type I 11/20/2013  . Secondary cardiomyopathy, unspecified 11/18/2013  . Hyperlipidemia 04/27/2013  . Sciatica of right side 03/16/2013  . Other and unspecified hyperlipidemia 03/16/2013  . Anemia, iron deficiency 09/15/2012  . Leg pain, right 09/15/2012  . Osteoporosis, senile 09/15/2012  . Atrial flutter 06/26/2012  . Esophageal reflux 02/21/2012  . Depression 12/24/2011  . Testosterone deficiency 09/23/2011  . Hypertension 08/25/2011  . Bradycardia 08/25/2011  . Neuropathy of lower extremity 08/25/2011  . Atrial fibrillation   . Chronic headaches   . History of mitral valve replacement with mechanical valve   . Long term (current) use of anticoagulants 08/22/2011    Subjective:  CC:   Chief Complaint  Patient presents with  . Follow-up    3 month    HPI:   Kevin [name] is a 80 y.o. male who presents for 3 month follow up on exertional dyspnea , hypertension and chronic low back pain ,     He was evaluated with stress test by Cardiology and pharmacologic test was negative for ischemia.  He was started on atorvastatin by Dr. Mariah Milling but stopped it due to subsequent  Adverse symptoms that he associated with the medication including to muscle weakness and feeling sweaty,  His last fasting lipid panel was July 2014. Las cath 20 yrs ago.  Requesting my opinion   He has had an Unusual rash on left lateral  leg that has been evaluated by Dasher , followed by Lin Givens finally by his DO.  The dermatologists attributed it to old age,  ,  His DO treated him presumedly for tinea with ketoconazole ointment.  The rash still itches and has not resolved despite use of antifungal cream bid. Has not been back to derm yet.Requesting my opinion   Not exercising  due to scoliosis and back pain. Has PT  ordered by DO.     Past Medical History  Diagnosis Date  . Atrial fibrillation   . Sciatica   . Chronic headaches started age 62  . History of mitral valve replacement with mechanical valve   . Sciatica   . Osteoporosis     secondary to low testoerone  . Cancer     melanoma- right ear  . GERD (gastroesophageal reflux disease)   . Glaucoma   . Hypertension     Past Surgical History  Procedure Laterality Date  . Nasal septum surgery  1963  . Cataract extraction, bilateral      x 2  . Melanoma excision      right ear  . Mitral valve replacement  1994  . Hernia repair      x 2  . Cardioversion  2013       The following portions of the patient's history were reviewed and updated as appropriate: Allergies, current medications, and problem list.    Review of Systems:   Patient denies headache, fevers, malaise, unintentional weight loss, skin rash, eye pain, sinus congestion and sinus pain, sore throat, dysphagia,  hemoptysis , cough, dyspnea, wheezing, chest pain, palpitations, orthopnea, edema, abdominal pain, nausea, melena, diarrhea, constipation, flank pain, dysuria, hematuria, urinary  Frequency, nocturia, numbness, tingling, seizures,  Focal weakness, Loss of consciousness,  Tremor, insomnia, depression, anxiety, and suicidal ideation.     History  Social History  . Marital Status: Widowed    Spouse Name: N/A    Number of Children: N/A  . Years of Education: N/A   Occupational History  . Not on file.   Social History Main Topics  . Smoking status: Never Smoker   . Smokeless tobacco: Never Used  . Alcohol Use: No  . Drug Use: No  . Sexual Activity: Not on file   Other Topics Concern  . Not on file   Social History Narrative   Exercise; light 3 x week   Lives alone, widowed at [facility]          Objective:  Filed Vitals:   02/15/14 0942  BP: 102/50  Pulse: 45  Temp: 97.6 F (36.4 C)  Resp: 16     General appearance: alert,  cooperative and appears stated age Ears: normal TM's and external ear canals both ears Throat: lips, mucosa, and tongue normal; teeth and gums normal Neck: no adenopathy, no carotid bruit, supple, symmetrical, trachea midline and thyroid not enlarged, symmetric, no tenderness/mass/nodules Back: asymmetric, scoliotic.  ROM restricted. Marland Kitchen No CVA tenderness. Lungs: clear to auscultation bilaterally Heart: regular rate and rhythm, S1, S2 normal, no murmur, click, rub or gallop Abdomen: soft, non-tender; bowel sounds normal; no masses,  no organomegaly Pulses: 2+ and symmetric Skin:  Oblong macular wine colored rash on left lateral leg.  Lymph nodes: Cervical, supraclavicular, and axillary nodes normal.  Assessment and Plan:  Hyperlipidemia Lab Results  Component Value Date   LDLDIRECT 105.9 03/13/2012   Discussed the risks and benefits of starting a statin in an 80 yr old with no history of CAD, given his complaint of hand weakness after starting Lipitor. .  Will repeat fasting lipids and consider QOD therapy with CoQ 10  added if LDL is > 130   Esophageal reflux Managed with daily omeprazole.  Discussed his recent reading that it causes strokes.  Reassurance provided that it was unlikely to  Cause him to have a CVA   Hypertension Well controlled on current regimen. Renal function stable, no changes today.    Atrial fibrillation S/p ablation May 2014.  Exam suggests sinus rhythm today     Secondary cardiomyopathy, unspecified No signs of ischemi on pharmacologic stress test.  Encouraged to resume water aerobics   Macular rash Nonblanching, no raised border or scaling. Not suggestive of tinea.  Advised to stop the ketoconazole and try triamcinolone/    Updated Medication List Outpatient Encounter Prescriptions as of 02/15/2014  Medication Sig  . 5-Hydroxytryptophan (5-HTP PO) Take by mouth daily.  . ALPHA LIPOIC ACID PO Take by mouth daily.  Marland Kitchen amLODipine (NORVASC) 10 MG tablet  Take 1 tablet (10 mg total) by mouth daily.  . Ascorbic Acid (VITAMIN C) 100 MG tablet Take 100 mg by mouth daily.  . brimonidine (ALPHAGAN) 0.15 % ophthalmic solution 2 (two) times daily.   . Calcium-Magnesium-Vitamin D (CALCIUM MAGNESIUM PO) Take by mouth 2 (two) times daily.  . DHA-EPA-Vit B6-B12-Folic Acid (CARDIOVID PLUS) CAPS Take 1 capsule by mouth 2 (two) times daily.   . ergocalciferol (VITAMIN D2) 50000 UNITS capsule Take 50,000 Units by mouth once a week.  . ferrous sulfate 324 (65 FE) MG TBEC Take 1 tablet by mouth every other day.  . Guggulipid (GUGULIPID PO) Take by mouth daily.  Marland Kitchen ketoconazole (NIZORAL) 2 % cream Apply 1 application topically 2 (two) times daily.   . L-GLUTAMINE PO Take by mouth.    . latanoprost (XALATAN) 0.005 %  ophthalmic solution daily.   Marland Kitchen losartan (COZAAR) 100 MG tablet TAKE 1 TABLET BY MOUTH EVERY DAY  . LUMIGAN 0.01 % SOLN 1 drop at bedtime.   . metoprolol tartrate (LOPRESSOR) 25 MG tablet TAKE 1 TABLET BY MOUTH TWICE DAILY  . metoprolol tartrate (LOPRESSOR) 25 MG tablet TAKE 1 TABLET BY MOUTH TWICE DAILY  . Multiple Vitamin (MULTIVITAMIN) tablet Take 1 tablet by mouth daily.    . NON FORMULARY Memory Essentials 2 tablets BID.  Marland Kitchen NON FORMULARY Pro Liver Milk Thistle and Acetyl-Cysteine 1 tablet daily.  . Nutritional Supplements (MELATONIN PO) Take by mouth.  . Omega-3 Fatty Acids (OMEGA 3 PO) Take by mouth.    Marland Kitchen omeprazole (PRILOSEC) 40 MG capsule daily.   [place] Calcium 500 MG TABS Take two by mouth daily   . Red Yeast Rice Extract (RED YEAST RICE PO) Take by mouth.    . RUTIN PO Take by mouth.  . SYRINGE-NEEDLE, DISP, 3 ML (SAFETY-LOK 3CC SYR 22GX1.5") 22G X 1-1/2" 3 ML MISC 2 ml's monthly-given 1 shot monthly   . testosterone cypionate (DEPOTESTOTERONE CYPIONATE) 200 MG/ML injection INJECT 0.5 ML IN THE MUSCLE EVERY 28 DAYS.  Marland Kitchen TURMERIC PO Take by mouth.    . warfarin (COUMADIN) 3 MG tablet Take 1 tablet (3 mg total) by mouth daily. 6 mg  on Friday  . warfarin (COUMADIN) 3 MG tablet TAKE 2 TABLETS BY MOUTH EVERY DAY  . [DISCONTINUED] ferrous sulfate 324 (65 FE) MG TBEC Take 1 tablet (325 mg total) by mouth daily with lunch.  . triamcinolone cream (KENALOG) 0.1 % daily.   . [DISCONTINUED] amLODipine (NORVASC) 10 MG tablet TAKE 1 TABLET BY MOUTH EVERY DAY     No orders of the defined types were placed in this encounter.    Return in about 3 months (around 05/18/2014).

## 2014-02-15 NOTE — Assessment & Plan Note (Signed)
Nonblanching, no raised border or scaling. Not suggestive of tinea.  Advised to stop the ketoconazole and try triamcinolone/

## 2014-02-15 NOTE — Assessment & Plan Note (Signed)
S/p ablation May 2014.  Exam suggests sinus rhythm today

## 2014-02-15 NOTE — Assessment & Plan Note (Addendum)
Lab Results  Component Value Date   LDLDIRECT 105.9 03/13/2012   Discussed the risks and benefits of starting a statin in an 80 yr old with no history of CAD, given his complaint of hand weakness after starting Lipitor. .  Will repeat fasting lipids and consider QOD therapy with CoQ 10  added if LDL is > 130

## 2014-02-15 NOTE — Assessment & Plan Note (Signed)
Managed with daily omeprazole.  Discussed his recent reading that it causes strokes.  Reassurance provided that it was unlikely to  Cause him to have a CVA

## 2014-02-15 NOTE — Assessment & Plan Note (Signed)
No signs of ischemi on pharmacologic stress test.  Encouraged to resume water aerobics

## 2014-02-15 NOTE — Patient Instructions (Addendum)
We will repeat your fasting lipids at [facility]  If your LDL is > 130,  I will recommend resuming a trial of atorvastatin every other day along with daily Co  10  To see if you can tolerate it.   Omeprazole does not cause Stroke!!!  If it is not necessary , you can stop it (treats reflux and gastritis)  Go back to see Dr Evorn Gong if the rash has not improved after trying the triamcinolone  twice daily .  Ketoconazole  treats a fungal infection so if it didn't work ,  So Discontinue it   Please resume your exercise program since your  Stress test was fine

## 2014-02-16 ENCOUNTER — Telehealth: Payer: Self-pay | Admitting: Internal Medicine

## 2014-02-16 NOTE — Telephone Encounter (Signed)
Relevant patient education mailed to patient.  

## 2014-02-25 ENCOUNTER — Encounter: Payer: Self-pay | Admitting: Internal Medicine

## 2014-03-02 LAB — BASIC METABOLIC PANEL
Creatinine: 1 mg/dL (ref 0.6–1.3)
Creatinine: 11 mg/dL — AB (ref 0.6–1.3)
GLUCOSE: 1 mg/dL
POTASSIUM: 4.8 mmol/L (ref 3.4–5.3)
Sodium: 140 mmol/L (ref 137–147)

## 2014-03-02 LAB — HEPATIC FUNCTION PANEL
ALK PHOS: 65 U/L (ref 25–125)
ALT: 20 U/L (ref 10–40)
AST: 36 U/L (ref 14–40)
Bilirubin, Total: 0.7 mg/dL

## 2014-03-02 LAB — CBC AND DIFFERENTIAL
HEMOGLOBIN: 12.6 g/dL — AB (ref 13.5–17.5)
Platelets: 216 10*3/uL (ref 150–399)
WBC: 4.6 10^3/mL

## 2014-03-02 LAB — POCT INR: INR: 2.5 — AB (ref 0.9–1.1)

## 2014-03-02 LAB — LIPID PANEL
Cholesterol: 174 mg/dL (ref 0–200)
HDL: 59 mg/dL (ref 35–70)
LDL CALC: 104 mg/dL
Triglycerides: 57 mg/dL (ref 40–160)

## 2014-03-02 LAB — TSH: TSH: 1.8 u[IU]/mL (ref 0.41–5.90)

## 2014-03-05 ENCOUNTER — Encounter: Payer: Self-pay | Admitting: *Deleted

## 2014-03-05 ENCOUNTER — Telehealth: Payer: Self-pay | Admitting: Internal Medicine

## 2014-03-05 NOTE — Telephone Encounter (Signed)
Your vitamin D, thyroid , liver and kidney function are normal.  You do not need any medication changes. Please continue your current medications and plan to repeat the labs in 6 months.   Coumadin level is therapeutic,  Continue current regimen and repeat PT/INR in one month./   Regards,   Dr. Derrel Nip

## 2014-03-05 NOTE — Telephone Encounter (Signed)
Letter mailed

## 2014-03-09 DIAGNOSIS — I429 Cardiomyopathy, unspecified: Secondary | ICD-10-CM | POA: Insufficient documentation

## 2014-03-09 DIAGNOSIS — I509 Heart failure, unspecified: Secondary | ICD-10-CM | POA: Insufficient documentation

## 2014-03-09 DIAGNOSIS — E7800 Pure hypercholesterolemia, unspecified: Secondary | ICD-10-CM | POA: Insufficient documentation

## 2014-04-01 LAB — PROTIME-INR

## 2014-04-04 ENCOUNTER — Telehealth: Payer: Self-pay | Admitting: Internal Medicine

## 2014-04-04 NOTE — Telephone Encounter (Signed)
Coumadin level is therapeutic,  INR is. 2.8 Continue current regimen and repeat PT/INR in one month

## 2014-04-05 ENCOUNTER — Other Ambulatory Visit: Payer: Self-pay | Admitting: Internal Medicine

## 2014-04-05 NOTE — Telephone Encounter (Signed)
Patient notified of results and voiced understanding. 

## 2014-04-23 ENCOUNTER — Encounter: Payer: Self-pay | Admitting: Internal Medicine

## 2014-05-04 ENCOUNTER — Encounter: Payer: Self-pay | Admitting: Internal Medicine

## 2014-05-18 LAB — PROTIME-INR: INR: 3.5 — AB (ref 0.9–1.1)

## 2014-05-19 ENCOUNTER — Telehealth: Payer: Self-pay | Admitting: Internal Medicine

## 2014-05-19 NOTE — Telephone Encounter (Signed)
Coumadin level is therapeutic,  Continue current regimen and repeat PT/INR in one month 

## 2014-05-19 NOTE — Telephone Encounter (Signed)
Left message for patient to call office.  

## 2014-05-19 NOTE — Telephone Encounter (Signed)
PT- INR in  Red folder 3.5 current regimen 3 mg daily patient has an appointment on 05/20/14 with you.

## 2014-05-20 ENCOUNTER — Ambulatory Visit (INDEPENDENT_AMBULATORY_CARE_PROVIDER_SITE_OTHER): Payer: Medicare Other | Admitting: Internal Medicine

## 2014-05-20 ENCOUNTER — Encounter: Payer: Self-pay | Admitting: Internal Medicine

## 2014-05-20 VITALS — BP 98/48 | HR 40 | Temp 97.4°F | Resp 16 | Ht 68.0 in | Wt 148.8 lb

## 2014-05-20 DIAGNOSIS — G8929 Other chronic pain: Secondary | ICD-10-CM

## 2014-05-20 DIAGNOSIS — I498 Other specified cardiac arrhythmias: Secondary | ICD-10-CM

## 2014-05-20 DIAGNOSIS — R51 Headache: Secondary | ICD-10-CM

## 2014-05-20 DIAGNOSIS — R001 Bradycardia, unspecified: Secondary | ICD-10-CM

## 2014-05-20 DIAGNOSIS — E785 Hyperlipidemia, unspecified: Secondary | ICD-10-CM

## 2014-05-20 DIAGNOSIS — Z7901 Long term (current) use of anticoagulants: Secondary | ICD-10-CM

## 2014-05-20 DIAGNOSIS — I429 Cardiomyopathy, unspecified: Secondary | ICD-10-CM

## 2014-05-20 MED ORDER — HYDROCODONE-ACETAMINOPHEN 5-325 MG PO TABS: 1.0000 | ORAL_TABLET | Freq: Every evening | ORAL | Status: DC | PRN

## 2014-05-20 NOTE — Progress Notes (Signed)
Patient ID: Kevin [name], male   DOB: [birth year], 80 y.o.   MRN: [id number]   Patient Active Problem List   Diagnosis Date Noted  . Macular rash 02/15/2014  . Second degree AV block, Mobitz type I 11/20/2013  . Secondary cardiomyopathy, unspecified 11/18/2013  . Hyperlipidemia 04/27/2013  . Sciatica of right side 03/16/2013  . Other and unspecified hyperlipidemia 03/16/2013  . Anemia, iron deficiency 09/15/2012  . Leg pain, right 09/15/2012  . Osteoporosis, senile 09/15/2012  . Atrial flutter 06/26/2012  . Esophageal reflux 02/21/2012  . Depression 12/24/2011  . Testosterone deficiency 09/23/2011  . Hypertension 08/25/2011  . Bradycardia 08/25/2011  . Neuropathy of lower extremity 08/25/2011  . Atrial fibrillation   . Chronic headaches   . History of mitral valve replacement with mechanical valve   . Long term (current) use of anticoagulants 08/22/2011    Subjective:  CC:   Chief Complaint  Patient presents with  . Follow-up    3 month    HPI:   [name] is a 80 y.o. male who presents for Follow up on chronic issues including atrial fibrillation ,  chronic back pain due to scoliosis and DDD, and chronic headaches.  His back pain is under treatment by his DO  .  He is wearing a back brace. The back pain radiates to right knee and is keeping him awake at night.   His chronic headaches, previously treated with several fioricet daily have recently returned and have been occurring daily for  for the past several weeks ,  always occurring around 10:00 am every morning.  The headaches are severe and occur bilaterally  In the frontal area, are sometimes accompanied by vertigo and occasionally with nausea without vomiting.  But without light sensitivity.  No visual changes.     Past Medical History  Diagnosis Date  . Atrial fibrillation   . Sciatica   . Chronic headaches started age 40  . History of mitral valve replacement with mechanical valve   . Sciatica   .  Osteoporosis     secondary to low testoerone  . Cancer     melanoma- right ear  . GERD (gastroesophageal reflux disease)   . Glaucoma   . Hypertension     Past Surgical History  Procedure Laterality Date  . Nasal septum surgery  1963  . Cataract extraction, bilateral      x 2  . Melanoma excision      right ear  . Mitral valve replacement  1994  . Hernia repair      x 2  . Cardioversion  2013       The following portions of the patient's history were reviewed and updated as appropriate: Allergies, current medications, and problem list.    Review of Systems:   Patient denies headache, fevers, malaise, unintentional weight loss, skin rash, eye pain, sinus congestion and sinus pain, sore throat, dysphagia,  hemoptysis , cough, dyspnea, wheezing, chest pain, palpitations, orthopnea, edema, abdominal pain, nausea, melena, diarrhea, constipation, flank pain, dysuria, hematuria, urinary  Frequency, nocturia, numbness, tingling, seizures,  Focal weakness, Loss of consciousness,  Tremor, insomnia, depression, anxiety, and suicidal ideation.     History   Social History  . Marital Status: Widowed    Spouse Name: N/A    Number of Children: N/A  . Years of Education: N/A   Occupational History  . Not on file.   Social History Main Topics  . Smoking status: Never Smoker   .  Smokeless tobacco: Never Used  . Alcohol Use: No  . Drug Use: No  . Sexual Activity: Not on file   Other Topics Concern  . Not on file   Social History Narrative   Exercise; light 3 x week   Lives alone, widowed at [facility]          Objective:  Filed Vitals:   05/20/14 0937  BP: 98/48  Pulse: 40  Temp: 97.4 F (36.3 C)  Resp: 16     General appearance: alert, cooperative and appears stated age Ears: normal TM's and external ear canals both ears Throat: lips, mucosa, and tongue normal; teeth and gums normal Neck: no adenopathy, no carotid bruit, supple, symmetrical, trachea midline  and thyroid not enlarged, symmetric, no tenderness/mass/nodules Back: symmetric, no curvature. ROM normal. No CVA tenderness. Lungs: clear to auscultation bilaterally Heart: regular rate and rhythm, S1, S2 normal, no murmur, click, rub or gallop Abdomen: soft, non-tender; bowel sounds normal; no masses,  no organomegaly Pulses: 2+ and symmetric Skin: Skin color, texture, turgor normal. No rashes or lesions Lymph nodes: Cervical, supraclavicular, and axillary nodes normal.  Assessment and Plan:  Chronic headaches Previously managed with daily use of fiorcet which he  finally stopped at Dr. Catalina Antigua urging..  New recurrence discussed today in relation to his physical therapy which he is doing twice daily.  Advised him to suspend PT for a week; if headaches continue, will need MRI    Bradycardia Advised to dc metoprolol   Other and unspecified hyperlipidemia Untreated due to statin intolerance.  Stress test negative for ischemia 2014   Long term (current) use of anticoagulants Coumadin level is therapeutic on current regimen and repeat PT/INR in one month. Lab Results  Component Value Date   INR 2.5* 03/02/2014   INR 2.8* 01/07/2014   INR 3.2* 11/09/2013       Updated Medication List Outpatient Encounter Prescriptions as of 05/20/2014  Medication Sig  . 5-Hydroxytryptophan (5-HTP PO) Take by mouth daily.  . ALPHA LIPOIC ACID PO Take by mouth daily.  Marland Kitchen amLODipine (NORVASC) 10 MG tablet Take 1 tablet (10 mg total) by mouth daily.  . Ascorbic Acid (VITAMIN C) 100 MG tablet Take 100 mg by mouth daily.  . brimonidine (ALPHAGAN) 0.15 % ophthalmic solution 2 (two) times daily.   . Calcium-Magnesium-Vitamin D (CALCIUM MAGNESIUM PO) Take by mouth 2 (two) times daily.  . DHA-EPA-Vit B6-B12-Folic Acid (CARDIOVID PLUS) CAPS Take 1 capsule by mouth 2 (two) times daily.   . ergocalciferol (VITAMIN D2) 50000 UNITS capsule Take 50,000 Units by mouth once a week.  . ferrous sulfate 324 (65 FE)  MG TBEC Take 1 tablet by mouth every other day.  . Guggulipid (GUGULIPID PO) Take by mouth daily.  Marland Kitchen ketoconazole (NIZORAL) 2 % cream Apply 1 application topically 2 (two) times daily.   . L-GLUTAMINE PO Take by mouth.    . latanoprost (XALATAN) 0.005 % ophthalmic solution daily.   Marland Kitchen losartan (COZAAR) 100 MG tablet TAKE 1 TABLET BY MOUTH EVERY DAY  . LUMIGAN 0.01 % SOLN 1 drop at bedtime.   . Multiple Vitamin (MULTIVITAMIN) tablet Take 1 tablet by mouth daily.    . NON FORMULARY Memory Essentials 2 tablets BID.  Marland Kitchen NON FORMULARY Pro Liver Milk Thistle and Acetyl-Cysteine 1 tablet daily.  . Nutritional Supplements (MELATONIN PO) Take by mouth.  . Omega-3 Fatty Acids (OMEGA 3 PO) Take by mouth.    Marland Kitchen omeprazole (PRILOSEC) 40 MG capsule daily.   Marland Kitchen  [place] Calcium 500 MG TABS Take two by mouth daily   . Red Yeast Rice Extract (RED YEAST RICE PO) Take by mouth.    . RUTIN PO Take by mouth.  . SYRINGE-NEEDLE, DISP, 3 ML (SAFETY-LOK 3CC SYR 22GX1.5") 22G X 1-1/2" 3 ML MISC 2 ml's monthly-given 1 shot monthly   . testosterone cypionate (DEPOTESTOTERONE CYPIONATE) 200 MG/ML injection INJECT 0.5 ML IN THE MUSCLE EVERY 28 DAYS.  Marland Kitchen triamcinolone cream (KENALOG) 0.1 % daily.   . TURMERIC PO Take by mouth.    . warfarin (COUMADIN) 3 MG tablet Take 1 tablet (3 mg total) by mouth daily. 6 mg on Friday  . warfarin (COUMADIN) 3 MG tablet TAKE 2 TABLETS BY MOUTH EVERY DAY  . [DISCONTINUED] metoprolol tartrate (LOPRESSOR) 25 MG tablet TAKE 1 TABLET BY MOUTH TWICE DAILY  . [DISCONTINUED] metoprolol tartrate (LOPRESSOR) 25 MG tablet TAKE 1 TABLET BY MOUTH TWICE DAILY  . HYDROcodone-acetaminophen (NORCO/VICODIN) 5-325 MG per tablet Take 1 tablet by mouth at bedtime as needed for moderate pain.

## 2014-05-20 NOTE — Telephone Encounter (Signed)
Patient notified in office.

## 2014-05-20 NOTE — Patient Instructions (Addendum)
Please suspend your morning exercise routine to see if the headaches resolve  If not,  We need to get an MRI/MRA of the brain   I am prescribing a mild narcotic, generic vicodin for you to take once daily as needed for headache or leg pain

## 2014-05-20 NOTE — Progress Notes (Signed)
Pre-visit discussion using our clinic review tool. No additional management support is needed unless otherwise documented below in the visit note.  

## 2014-05-22 NOTE — Assessment & Plan Note (Signed)
Previously managed with daily use of fiorcet which he  finally stopped at Dr. Catalina Antigua urging..  New recurrence discussed today in relation to his physical therapy which he is doing twice daily.  Advised him to suspend PT for a week; if headaches continue, will need MRI

## 2014-05-22 NOTE — Assessment & Plan Note (Signed)
Untreated due to statin intolerance.  Stress test negative for ischemia 2014

## 2014-05-22 NOTE — Assessment & Plan Note (Signed)
Advised to dc metoprolol

## 2014-05-22 NOTE — Assessment & Plan Note (Signed)
Coumadin level is therapeutic on current regimen and repeat PT/INR in one month. Lab Results  Component Value Date   INR 2.5* 03/02/2014   INR 2.8* 01/07/2014   INR 3.2* 11/09/2013

## 2014-06-17 ENCOUNTER — Other Ambulatory Visit: Payer: Self-pay | Admitting: Internal Medicine

## 2014-06-17 LAB — PROTIME-INR: INR: 3.7 — AB (ref 0.9–1.1)

## 2014-06-18 ENCOUNTER — Telehealth: Payer: Self-pay | Admitting: Internal Medicine

## 2014-06-18 ENCOUNTER — Ambulatory Visit: Payer: Medicare Other | Admitting: Internal Medicine

## 2014-06-18 NOTE — Telephone Encounter (Signed)
Please make sure no recent use of antibiotics and no recent bleeding or bruising. I would recommend to continue same dose, as his goal range is 2.5-3.5.  I would recommend repeat INR Tuesday.

## 2014-06-18 NOTE — Telephone Encounter (Addendum)
Received patient PT-INR from [facility]  INR is 3.7  Patient current regimen of coumadin is 3 mg daily please advise.

## 2014-06-18 NOTE — Telephone Encounter (Signed)
Patient notified and voiced understanding and has not taken any new Antibiotics at this time or added any new supplements.

## 2014-06-22 LAB — PROTIME-INR: INR: 4.8 — AB (ref 0.9–1.1)

## 2014-06-23 ENCOUNTER — Telehealth: Payer: Self-pay | Admitting: Internal Medicine

## 2014-06-23 MED ORDER — WARFARIN SODIUM 3 MG PO TABS
3.0000 mg | ORAL_TABLET | Freq: Every day | ORAL | Status: DC
Start: 2014-06-23 — End: 2015-10-10

## 2014-06-23 MED ORDER — WARFARIN SODIUM 2 MG PO TABS: 2.0000 mg | ORAL_TABLET | Freq: Every day | ORAL | Status: DC

## 2014-06-23 NOTE — Telephone Encounter (Signed)
Stop the coumadin for 2 days  (Wed Thurs) and resume 2 mg daily on Friday .  New regimen  going forward:  2 mg on Mon Wed Friday,  3 mg all other days  Repeat INR in 2 weeks   Ill send refills  For the 2  and 3 mg doses

## 2014-06-23 NOTE — Telephone Encounter (Signed)
Called and advised patient of results, verbalized understanding and read back correct instructions. Pt also has appt with Dr. Derrel Nip on Friday.

## 2014-06-23 NOTE — Telephone Encounter (Signed)
Left message for patient to return call to office. 

## 2014-06-23 NOTE — Telephone Encounter (Signed)
Patient INR 4.8 PT is 50.8, patient currently on 3 mg daily of coumadin. 06/18/14 inr was 3.8 on 3 mg of coumadin daily. Tried to reach patient to see if new supplements added no answer.

## 2014-06-25 ENCOUNTER — Encounter: Payer: Self-pay | Admitting: Internal Medicine

## 2014-06-25 ENCOUNTER — Ambulatory Visit (INDEPENDENT_AMBULATORY_CARE_PROVIDER_SITE_OTHER): Payer: Medicare Other | Admitting: Internal Medicine

## 2014-06-25 VITALS — BP 102/58 | HR 60 | Temp 97.5°F | Resp 18 | Wt 149.2 lb

## 2014-06-25 DIAGNOSIS — R0989 Other specified symptoms and signs involving the circulatory and respiratory systems: Secondary | ICD-10-CM

## 2014-06-25 DIAGNOSIS — Z23 Encounter for immunization: Secondary | ICD-10-CM

## 2014-06-25 DIAGNOSIS — M79609 Pain in unspecified limb: Secondary | ICD-10-CM

## 2014-06-25 DIAGNOSIS — R0609 Other forms of dyspnea: Secondary | ICD-10-CM | POA: Insufficient documentation

## 2014-06-25 DIAGNOSIS — Z7901 Long term (current) use of anticoagulants: Secondary | ICD-10-CM

## 2014-06-25 DIAGNOSIS — M79604 Pain in right leg: Secondary | ICD-10-CM

## 2014-06-25 LAB — PROTIME-INR: Protime: 50.8 seconds — AB (ref 10.0–13.8)

## 2014-06-25 LAB — POCT INR: INR: 4.8 — AB (ref <=1.1)

## 2014-06-25 MED ORDER — GABAPENTIN 100 MG PO CAPS: 100.0000 mg | ORAL_CAPSULE | Freq: Three times a day (TID) | ORAL | Status: DC

## 2014-06-25 MED ORDER — TETANUS-DIPHTH-ACELL PERTUSSIS 5-2.5-18.5 LF-MCG/0.5 IM SUSP
0.5000 mL | Freq: Once | INTRAMUSCULAR | Status: DC
Start: 2014-06-25 — End: 2015-07-21

## 2014-06-25 NOTE — Progress Notes (Signed)
Pre-visit discussion using our clinic review tool. No additional management support is needed unless otherwise documented below in the visit note.  

## 2014-06-25 NOTE — Assessment & Plan Note (Addendum)
Chronic since 2013. And presently constantnly  8/10 in severeity.  Intolerant of hydrocodone and cymbalta  Was not tolerated but only tried once.  He has osteoporosis, mild DJD of right hip joint and chronic low back pain complicated by scoliosis .  Plain films of femur , hip and  lumbar spine have all been done  And repeats have suggested progression of degeneration.  Recommending trial of gabapentin.  If pain persists will get EMG nerve conduction studies to determine if a neuropathy is present  .  May need referral to [facility] vs Neurosurgery vs Pain clinic for epidural  Vs hip joint injections, all of which would require suspension of coumadin  , which is taken for L/t anticoagulation due to prosthetic mitral valve and atrial fib,  INR 2.5 to 3.5 goal

## 2014-06-25 NOTE — Progress Notes (Signed)
Patient ID: Kevin [name], male   DOB: 10-10-1925, 80 y.o.   MRN: [id number]  Patient Active Problem List   Diagnosis Date Noted  . Exertional dyspnea 06/25/2014  . Macular rash 02/15/2014  . Second degree AV block, Mobitz type I 11/20/2013  . Secondary cardiomyopathy, unspecified 11/18/2013  . Hyperlipidemia 04/27/2013  . Sciatica of right side 03/16/2013  . Other and unspecified hyperlipidemia 03/16/2013  . Anemia, iron deficiency 09/15/2012  . Leg pain, right 09/15/2012  . Osteoporosis, senile 09/15/2012  . Atrial flutter 06/26/2012  . Esophageal reflux 02/21/2012  . Depression 12/24/2011  . Testosterone deficiency 09/23/2011  . Hypertension 08/25/2011  . Bradycardia 08/25/2011  . Neuropathy of lower extremity 08/25/2011  . Atrial fibrillation   . Chronic headaches   . History of mitral valve replacement with mechanical valve   . Long term (current) use of anticoagulants 08/22/2011    Subjective:  CC:   Chief Complaint  Patient presents with  . Follow-up    pain medication stopped due to insomnia (hydrocodone).    HPI:   Kevin [name] is a 80 y.o. male who presents for Persistent lateral right leg pain.  DO suggested voltaren gel  Stopped after 3 days .  Then prescribed a  ketoprofen compound which he has been applying for over a week with no improvement win the lateral thigh pain which he scribes as burning and stinging.  8/10 in severity,  Constant 24/7 .   Hip x ray was repeated by DO and there was no change to DJD from prior year.    He was then prescribed cymbalta, and he he has taken one dose which has made him feel "bad all over" with nausea and a loose stools yesterday   2) Progressive dyspnea with minimal exertion jsut walking across the hallway or going up a short flight of stairs.  Does not occure at rest or when supine.  Was told by [facility] it was not his heart (despite having a cardioymopathy,  Last EF 25 to 35% by 2014 ECHO)   Hydrocodone 5 /325 caused  insomnia   Past Medical History  Diagnosis Date  . Atrial fibrillation   . Sciatica   . Chronic headaches started age 80  . History of mitral valve replacement with mechanical valve   . Sciatica   . Osteoporosis     secondary to low testoerone  . Cancer     melanoma- right ear  . GERD (gastroesophageal reflux disease)   . Glaucoma   . Hypertension     Past Surgical History  Procedure Laterality Date  . Nasal septum surgery  1963  . Cataract extraction, bilateral      x 2  . Melanoma excision      right ear  . Mitral valve replacement  1994  . Hernia repair      x 2  . Cardioversion  2013       The following portions of the patient's history were reviewed and updated as appropriate: Allergies, current medications, and problem list.    Review of Systems:   Patient denies headache, fevers, malaise, unintentional weight loss, skin rash, eye pain, sinus congestion and sinus pain, sore throat, dysphagia,  hemoptysis , cough,, wheezing, chest pain, palpitations, orthopnea, edema, abdominal pain, nausea, melena, diarrhea, constipation, flank pain, dysuria, hematuria, urinary  Frequency, nocturia, numbness, tingling, seizures,  Focal weakness, Loss of consciousness,  Tremor, insomnia, depression, anxiety, and suicidal ideation.     History   Social  History  . Marital Status: Widowed    Spouse Name: N/A    Number of Children: N/A  . Years of Education: N/A   Occupational History  . Not on file.   Social History Main Topics  . Smoking status: Never Smoker   . Smokeless tobacco: Never Used  . Alcohol Use: No  . Drug Use: No  . Sexual Activity: Not on file   Other Topics Concern  . Not on file   Social History Narrative   Exercise; light 3 x week   Lives alone, widowed at [facility]          Objective:  Filed Vitals:   06/25/14 1145  BP: 102/58  Pulse: 60  Temp: 97.5 F (36.4 C)  Resp: 18     General appearance: alert, cooperative and appears  stated age Ears: normal TM's and external ear canals both ears Throat: lips, mucosa, and tongue normal; teeth and gums normal Neck: no adenopathy, no carotid bruit, supple, symmetrical, trachea midline and thyroid not enlarged, symmetric, no tenderness/mass/nodules Back: symmetric, no curvature. ROM normal. No CVA tenderness. Lungs: clear to auscultation bilaterally Heart: regular rate and rhythm, S1, S2 normal, no murmur, click, rub or gallop Abdomen: soft, non-tender; bowel sounds normal; no masses,  no organomegaly Pulses: 2+ and symmetric Skin: Skin color, texture, turgor normal. No rashes or lesions Lymph nodes: Cervical, supraclavicular, and axillary nodes normal.  Assessment and Plan:  Exertional dyspnea Workup deferred by cardiology as non cardiac etiology.  Palin chest xray ordered to rule ut large hiatal hernia   Leg pain, right Chronic since 2013. And presently constantly  8/10 in severity.  Intolerant of hydrocodone and cymbalta.   Was not tolerated but only tried once.  He has osteoporosis, mild DJD of right hip joint and chronic low back pain complicated by scoliosis .  Plain films of femur , hip and  lumbar spine have all been done  And repeats have suggested progression of degeneration.  Recommending trial of gabapentin.  If pain persists will get EMG nerve conduction studies to determine if a neuropathy is present  .  May need referral to Ortho vs Neurosurgery vs Pain clinic for epidural  Vs hip joint injections, all of which would require suspension of coumadin  , which is taken for L/t anticoagulation due to prosthetic mitral valve and atrial fib,  INR 2.5 to 3.5 goal     Long term (current) use of anticoagulants  Lab Results  Component Value Date   INR 4.8* 06/25/2014   INR 3.5* 05/18/2014   INR 2.5* 03/02/2014   PROTIME 50.8* 06/25/2014    His last INr was > 4.0 .  His dose was reduced by 3 mg/week and repeat is due in 2 weeks.  He denies new medications or supplements  but was recently started on cymbalta.   A total of 40 minutes was spent with patient more than half of which was spent in counseling about the above mentioned problems, reviewing records from other prviders and coordination of care.   Updated Medication List Outpatient Encounter Prescriptions as of 06/25/2014  Medication Sig  . 5-Hydroxytryptophan (5-HTP PO) Take by mouth daily.  . ALPHA LIPOIC ACID PO Take by mouth daily.  Marland Kitchen amLODipine (NORVASC) 10 MG tablet Take 1 tablet (10 mg total) by mouth daily.  . Ascorbic Acid (VITAMIN C) 100 MG tablet Take 100 mg by mouth daily.  . brimonidine (ALPHAGAN) 0.15 % ophthalmic solution 2 (two) times daily.   Marland Kitchen  Calcium-Magnesium-Vitamin D (CALCIUM MAGNESIUM PO) Take by mouth 2 (two) times daily.  . DHA-EPA-Vit B6-B12-Folic Acid (CARDIOVID PLUS) CAPS Take 1 capsule by mouth 2 (two) times daily.   . DULoxetine (CYMBALTA) 20 MG capsule Take 1 capsule by mouth daily.  . ergocalciferol (VITAMIN D2) 50000 UNITS capsule Take 50,000 Units by mouth once a week.  . ferrous sulfate 324 (65 FE) MG TBEC Take 1 tablet by mouth every other day.  . Guggulipid (GUGULIPID PO) Take by mouth daily.  Marland Kitchen ketoconazole (NIZORAL) 2 % cream Apply 1 application topically 2 (two) times daily.   Marland Kitchen KETOPROFEN-KETAMINE-LIDOCAINE EX Apply 1 application topically 2 (two) times daily.  . L-GLUTAMINE PO Take by mouth.    . latanoprost (XALATAN) 0.005 % ophthalmic solution daily.   Marland Kitchen losartan (COZAAR) 100 MG tablet TAKE 1 TABLET BY MOUTH EVERY DAY  . LUMIGAN 0.01 % SOLN 1 drop at bedtime.   . Multiple Vitamin (MULTIVITAMIN) tablet Take 1 tablet by mouth daily.    . NON FORMULARY Memory Essentials 2 tablets BID.  Marland Kitchen NON FORMULARY Pro Liver Milk Thistle and Acetyl-Cysteine 1 tablet daily.  . Nutritional Supplements (MELATONIN PO) Take by mouth.  . Omega-3 Fatty Acids (OMEGA 3 PO) Take by mouth.    Marland Kitchen omeprazole (PRILOSEC) 40 MG capsule daily.   Loma Boston Calcium 500 MG TABS Take two  by mouth daily   . Red Yeast Rice Extract (RED YEAST RICE PO) Take by mouth.    . RUTIN PO Take by mouth.  . SYRINGE-NEEDLE, DISP, 3 ML (SAFETY-LOK 3CC SYR 22GX1.5") 22G X 1-1/2" 3 ML MISC 2 ml's monthly-given 1 shot monthly   . testosterone cypionate (DEPOTESTOTERONE CYPIONATE) 200 MG/ML injection INJECT 0.5 ML IN THE MUSCLE EVERY 28 DAYS.  Marland Kitchen triamcinolone cream (KENALOG) 0.1 % daily.   . TURMERIC PO Take by mouth.    . warfarin (COUMADIN) 2 MG tablet Take 1 tablet (2 mg total) by mouth daily. On Monday Wednesday and Friday  . warfarin (COUMADIN) 3 MG tablet Take 1 tablet (3 mg total) by mouth daily. On Tuesday Thursday, Sat and Sunday  . gabapentin (NEURONTIN) 100 MG capsule Take 1 capsule (100 mg total) by mouth 3 (three) times daily.  Marland Kitchen HYDROcodone-acetaminophen (NORCO/VICODIN) 5-325 MG per tablet Take 1 tablet by mouth at bedtime as needed for moderate pain.  . Tdap (BOOSTRIX) 5-2.5-18.5 LF-MCG/0.5 injection Inject 0.5 mLs into the muscle once.     Orders Placed This Encounter  Procedures  . DG Chest 2 View  . Pneumococcal conjugate vaccine 13-valent    No Follow-up on file.

## 2014-06-25 NOTE — Assessment & Plan Note (Signed)
His last INr was > 4.0 .  His dose was reduced by 3 mg/week and repeat is due in 2 weeks.  He denies new medications or supplements.

## 2014-06-25 NOTE — Assessment & Plan Note (Signed)
Workup deferred by cardiology as non cardiac etiology.  Palin chest xray ordered to rule ut large hiatal hernia

## 2014-06-25 NOTE — Patient Instructions (Signed)
Stop the cymbalta for now  Trial of gababentin for your leg pain.  Start with one capsule at bedtime,   Add a second dose in the morning and a d tird in early early afternoon dose  If needed  over the  next two weeks   Have the RN at [facility] check your INR IN TWO WEEKS   I have ordered a chest x ray to evaluate your shortness of breath.    You can get this done at the [facility] office this afernoon (until 3:00 pm) or next week  You received the pneumonia vaccine today  Your still need your tetanus-diptheria-pertussis vaccine (TDaP) but you can get it for less $$$ at a local pharmacy with the script I have provided you.  Please wait a month

## 2014-06-28 ENCOUNTER — Ambulatory Visit (INDEPENDENT_AMBULATORY_CARE_PROVIDER_SITE_OTHER)
Admission: RE | Admit: 2014-06-28 | Discharge: 2014-06-28 | Disposition: A | Discharge status: Home / Self Care | Payer: Medicare Other | Source: Ambulatory Visit | Attending: Internal Medicine | Admitting: Internal Medicine

## 2014-06-28 DIAGNOSIS — R0989 Other specified symptoms and signs involving the circulatory and respiratory systems: Secondary | ICD-10-CM

## 2014-06-28 DIAGNOSIS — R0609 Other forms of dyspnea: Secondary | ICD-10-CM

## 2014-07-07 ENCOUNTER — Telehealth: Payer: Self-pay | Admitting: Internal Medicine

## 2014-07-07 LAB — PROTIME-INR

## 2014-07-07 NOTE — Telephone Encounter (Signed)
3 months for OV

## 2014-07-07 NOTE — Telephone Encounter (Signed)
Coumadin level is therapeutic,  Continue current regimen and repeat PT/INR in one month 

## 2014-07-07 NOTE — Telephone Encounter (Signed)
Patient notified and voiced understanding, patient enquire as to when should he schedule an appointment?

## 2014-07-09 NOTE — Telephone Encounter (Signed)
Patient notified

## 2014-08-01 ENCOUNTER — Other Ambulatory Visit: Payer: Self-pay | Admitting: Internal Medicine

## 2014-08-03 LAB — PROTIME-INR: INR: 2.3 — AB (ref 0.9–1.1)

## 2014-08-05 ENCOUNTER — Telehealth: Payer: Self-pay | Admitting: Internal Medicine

## 2014-08-05 NOTE — Telephone Encounter (Signed)
Left message for pt to return my call.

## 2014-08-05 NOTE — Telephone Encounter (Signed)
Have him increased dose to 3 mg all every day except  Friday.  2 mg on fridays only.  Repeat INR in 2 weeks

## 2014-08-05 NOTE — Telephone Encounter (Signed)
Patient stated he does not think he has missed a dose of coumadin on Monday ,Wednesday and Friday patient taking 2 mg and all other days patient takes 3 mg.

## 2014-08-05 NOTE — Telephone Encounter (Signed)
INR is a little low  Has he missed any doses in the last week?

## 2014-08-05 NOTE — Telephone Encounter (Signed)
Left message for patient to return call to office. 

## 2014-08-06 ENCOUNTER — Other Ambulatory Visit: Payer: Self-pay | Admitting: Internal Medicine

## 2014-08-06 NOTE — Telephone Encounter (Signed)
Left message for pt to return my call.

## 2014-08-06 NOTE — Telephone Encounter (Signed)
Patient notified of new dosage and voiced understanding.

## 2014-08-22 ENCOUNTER — Other Ambulatory Visit: Payer: Self-pay | Admitting: Cardiovascular Disease

## 2014-09-01 LAB — PROTIME-INR: INR: 2.4 — AB (ref 0.9–1.1)

## 2014-09-03 ENCOUNTER — Telehealth: Payer: Self-pay | Admitting: Internal Medicine

## 2014-09-03 NOTE — Telephone Encounter (Signed)
Coumadin level is therapeutic,  Continue current regimen and repeat PT/INR in one month 

## 2014-09-03 NOTE — Telephone Encounter (Signed)
Patient tnoified and voiced understanding. 

## 2014-09-03 NOTE — Telephone Encounter (Signed)
Left message for patient to return call to office. 

## 2014-09-27 ENCOUNTER — Encounter: Payer: Self-pay | Admitting: Internal Medicine

## 2014-09-30 LAB — PROTIME-INR

## 2014-10-03 ENCOUNTER — Telehealth: Payer: Self-pay | Admitting: Internal Medicine

## 2014-10-03 DIAGNOSIS — Z7901 Long term (current) use of anticoagulants: Secondary | ICD-10-CM

## 2014-10-03 NOTE — Telephone Encounter (Signed)
His recent  INR is unreadable due to poor copy received.  Please call [facility].

## 2014-10-04 NOTE — Telephone Encounter (Signed)
Called [facility] Faxing over another copy  But here are the Results for the pt/inr  PT: 23.6  INR: 2.3

## 2014-10-04 NOTE — Telephone Encounter (Signed)
Can you get me the INR for this patient?

## 2014-10-04 NOTE — Telephone Encounter (Signed)
3 mg 4 days per week 2 mg per week of coumadin.

## 2014-10-04 NOTE — Telephone Encounter (Signed)
INr is a little low, then  Confirm current regimen so I can alter it,  See if he has missed any days in the last week

## 2014-10-04 NOTE — Telephone Encounter (Signed)
Increase to 3 mg daily starting tonight,  Repeat inr in 2 weeks

## 2014-10-04 NOTE — Telephone Encounter (Signed)
Patient voiced understanding by repeating dosage.

## 2014-10-04 NOTE — Telephone Encounter (Signed)
Left message for patient to call office.  

## 2014-10-14 ENCOUNTER — Ambulatory Visit (INDEPENDENT_AMBULATORY_CARE_PROVIDER_SITE_OTHER): Payer: Medicare Other | Admitting: Internal Medicine

## 2014-10-14 ENCOUNTER — Encounter: Payer: Self-pay | Admitting: Internal Medicine

## 2014-10-14 VITALS — BP 118/60 | HR 50 | Temp 98.6°F | Resp 14 | Ht 68.0 in | Wt 145.2 lb

## 2014-10-14 DIAGNOSIS — R001 Bradycardia, unspecified: Secondary | ICD-10-CM

## 2014-10-14 DIAGNOSIS — M5431 Sciatica, right side: Secondary | ICD-10-CM

## 2014-10-14 DIAGNOSIS — Z7901 Long term (current) use of anticoagulants: Secondary | ICD-10-CM

## 2014-10-14 DIAGNOSIS — Z23 Encounter for immunization: Secondary | ICD-10-CM

## 2014-10-14 DIAGNOSIS — I10 Essential (primary) hypertension: Secondary | ICD-10-CM

## 2014-10-14 NOTE — Progress Notes (Signed)
Patient ID: Kevin [name], male   DOB: Apr 29, 1925, 80 y.o.   MRN: [id number]  Patient Active Problem List   Diagnosis Date Noted  . Exertional dyspnea 06/25/2014  . Macular rash 02/15/2014  . Second degree AV block, Mobitz type I 11/20/2013  . Secondary cardiomyopathy, unspecified 11/18/2013  . Hyperlipidemia 04/27/2013  . Sciatica of right side 03/16/2013  . Other and unspecified hyperlipidemia 03/16/2013  . Anemia, iron deficiency 09/15/2012  . Leg pain, right 09/15/2012  . Osteoporosis, senile 09/15/2012  . Atrial flutter 06/26/2012  . Esophageal reflux 02/21/2012  . Depression 12/24/2011  . Testosterone deficiency 09/23/2011  . Hypertension 08/25/2011  . Bradycardia 08/25/2011  . Neuropathy of lower extremity 08/25/2011  . Atrial fibrillation   . Chronic headaches   . History of mitral valve replacement with mechanical valve   . Long term current use of anticoagulant therapy 08/22/2011    Subjective:  CC:   Chief Complaint  Patient presents with  . Follow-up    3 month coumadin current dose 3 mg daily. Rechck next wel at the [facility].    HPI:   Kevin [name] is a 80 y.o. male who presents for  3 month follow up on chronic issues including history of atrial fib/flutter with second degeree AV block,  on chronic coumadin therapy,  chronic back pain with radiculopathy secondary to severe scoliosis and degenerative disk disease, and osteoporosis secondary to testosterone deficiency with prior testosterone replacement now stopped,  He saw his DO in August and his cardiologist last month .  He was  given a back brace to wear by Dr. Letitia Caul. His cc continues to be progessive weakness and fatigue. is DO reduced his Toprol to 12.5 mg daily for bradycardia,  Pulse of 44,  but he has been taking it twice daily   He is now using a walker to ambulate and he has difficulty rising from a seated position and has to rely on considerable use of arms to do so.  .  Having trouble getting out  of chairs,  Having to lift with his arms. He has not had any falls in the last 6 months. He is apparently tolerating the cymbalta and gabapentin currently for radiculopathy without previously referenced side effects    Past Medical History  Diagnosis Date  . Atrial fibrillation   . Sciatica   . Chronic headaches started age 34  . History of mitral valve replacement with mechanical valve   . Sciatica   . Osteoporosis     secondary to low testoerone  . Cancer     melanoma- right ear  . GERD (gastroesophageal reflux disease)   . Glaucoma   . Hypertension     Past Surgical History  Procedure Laterality Date  . Nasal septum surgery  1963  . Cataract extraction, bilateral      x 2  . Melanoma excision      right ear  . Mitral valve replacement  1994  . Hernia repair      x 2  . Cardioversion  2013       The following portions of the patient's history were reviewed and updated as appropriate: Allergies, current medications, and problem list.    Review of Systems:   Patient denies headache, fevers, malaise, unintentional weight loss, skin rash, eye pain, sinus congestion and sinus pain, sore throat, dysphagia,  hemoptysis , cough, dyspnea, wheezing, chest pain, palpitations, orthopnea, edema, abdominal pain, nausea, melena, diarrhea, constipation, flank pain, dysuria, hematuria, urinary  Frequency, nocturia, numbness, tingling, seizures,  Focal weakness, Loss of consciousness,  Tremor, insomnia, depression, anxiety, and suicidal ideation.     History   Social History  . Marital Status: Widowed    Spouse Name: N/A    Number of Children: N/A  . Years of Education: N/A   Occupational History  . Not on file.   Social History Main Topics  . Smoking status: Never Smoker   . Smokeless tobacco: Never Used  . Alcohol Use: No  . Drug Use: No  . Sexual Activity: Not on file   Other Topics Concern  . Not on file   Social History Narrative   Exercise; light 3 x week    Lives alone, widowed at [facility]          Objective:  Filed Vitals:   10/14/14 0916  BP: 118/60  Pulse: 50  Temp: 98.6 F (37 C)  Resp: 14     General appearance: alert, cooperative and appears stated age Ears: normal TM's and external ear canals both ears Throat: lips, mucosa, and tongue normal; teeth and gums normal Neck: no adenopathy, no carotid bruit, supple, symmetrical, trachea midline and thyroid not enlarged, symmetric, no tenderness/mass/nodules Back: symmetric, no curvature. ROM normal. No CVA tenderness. Lungs: clear to auscultation bilaterally Heart: regular rate and rhythm, S1, S2 normal, no murmur, click, rub or gallop Abdomen: soft, non-tender; bowel sounds normal; no masses,  no organomegaly Pulses: 2+ and symmetric Skin: Skin color, texture, turgor normal. No rashes or lesions Lymph nodes: Cervical, supraclavicular, and axillary nodes normal.  Assessment and Plan:  Bradycardia Persistent due to medication .  Explained to patient that taking Toprol XL twice daily was incorrect and did not effectively reduce his dose as directed by Dr Clayborn Bigness.  Adviwed to take 12.5 mg ONCE DAILY   Long term current use of anticoagulant therapy Coumadin level is checked at [facility] and relayed to me monthly.  He is due.  Will Continue current regimen and repeat PT/INR   Lab Results  Component Value Date   INR 2.4* 09/01/2014   INR 4.8* 06/25/2014   INR 4.8* 06/22/2014   PROTIME 50.8* 06/25/2014       Sciatica of right side Caused by degenerative changes and scoliosis, with progressive disability noted,  Patient now having difficulty rising from seated position and using a walker to ambulate  Now using cymbalta and gabapentin for control of pain .and neuropathy .  He needs a lift chair to assist him in rising    Hypertension managed with amlodipine and lose dose Toprol.  He has normal renal function per last check in March,  Cr was 0.95. He is due for  assessment of renal function and electrolytes   Lab Results  Component Value Date   CREATININE 11.0* 03/02/2014   CREATININE 1.0 03/02/2014     Lab Results  Component Value Date   NA 140 03/02/2014   K 4.8 03/02/2014   CL 102 12/16/2012   CO2 30 12/16/2012      Updated Medication List Outpatient Encounter Prescriptions as of 10/14/2014  Medication Sig  . 5-Hydroxytryptophan (5-HTP PO) Take by mouth daily.  . ALPHA LIPOIC ACID PO Take by mouth daily.  Marland Kitchen amLODipine (NORVASC) 10 MG tablet Take 1 tablet (10 mg total) by mouth daily.  Marland Kitchen amLODipine (NORVASC) 10 MG tablet TAKE 1 TABLET BY MOUTH EVERY DAY  . Ascorbic Acid (VITAMIN C) 100 MG tablet Take 100 mg by mouth daily.  Marland Kitchen  brimonidine (ALPHAGAN) 0.15 % ophthalmic solution 2 (two) times daily.   . Calcium-Magnesium-Vitamin D (CALCIUM MAGNESIUM PO) Take by mouth 2 (two) times daily.  . DHA-EPA-Vit B6-B12-Folic Acid (CARDIOVID PLUS) CAPS Take 1 capsule by mouth 2 (two) times daily.   . DULoxetine (CYMBALTA) 20 MG capsule Take 1 capsule by mouth daily.  . ergocalciferol (VITAMIN D2) 50000 UNITS capsule Take 50,000 Units by mouth once a week.  . ferrous sulfate 324 (65 FE) MG TBEC Take 1 tablet by mouth every other day.  . gabapentin (NEURONTIN) 100 MG capsule Take 1 capsule (100 mg total) by mouth 3 (three) times daily.  . Guggulipid (GUGULIPID PO) Take by mouth daily.  Marland Kitchen HYDROcodone-acetaminophen (NORCO/VICODIN) 5-325 MG per tablet Take 1 tablet by mouth at bedtime as needed for moderate pain.  Marland Kitchen ketoconazole (NIZORAL) 2 % cream Apply 1 application topically 2 (two) times daily.   Marland Kitchen KETOPROFEN-KETAMINE-LIDOCAINE EX Apply 1 application topically 2 (two) times daily.  . L-GLUTAMINE PO Take by mouth.    . latanoprost (XALATAN) 0.005 % ophthalmic solution daily.   Marland Kitchen losartan (COZAAR) 100 MG tablet TAKE 1 TABLET BY MOUTH EVERY DAY  . LUMIGAN 0.01 % SOLN 1 drop at bedtime.   . metoprolol succinate (TOPROL-XL) 25 MG 24 hr tablet Take  12.5 mg by mouth daily.  . Multiple Vitamin (MULTIVITAMIN) tablet Take 1 tablet by mouth daily.    . NON FORMULARY Memory Essentials 2 tablets BID.  Marland Kitchen NON FORMULARY Pro Liver Milk Thistle and Acetyl-Cysteine 1 tablet daily.  . Nutritional Supplements (MELATONIN PO) Take by mouth.  . Omega-3 Fatty Acids (OMEGA 3 PO) Take by mouth.    Marland Kitchen omeprazole (PRILOSEC) 40 MG capsule daily.   Loma Boston Calcium 500 MG TABS Take two by mouth daily   . Red Yeast Rice Extract (RED YEAST RICE PO) Take by mouth.    . RUTIN PO Take by mouth.  . SYRINGE-NEEDLE, DISP, 3 ML (SAFETY-LOK 3CC SYR 22GX1.5") 22G X 1-1/2" 3 ML MISC 2 ml's monthly-given 1 shot monthly   . Tdap (BOOSTRIX) 5-2.5-18.5 LF-MCG/0.5 injection Inject 0.5 mLs into the muscle once.  . testosterone cypionate (DEPOTESTOTERONE CYPIONATE) 200 MG/ML injection INJECT 0.5 ML IN THE MUSCLE EVERY 28 DAYS.  Marland Kitchen triamcinolone cream (KENALOG) 0.1 % daily.   . TURMERIC PO Take by mouth.    . warfarin (COUMADIN) 2 MG tablet Take 1 tablet (2 mg total) by mouth daily. On Monday Wednesday and Friday  . warfarin (COUMADIN) 3 MG tablet Take 1 tablet (3 mg total) by mouth daily. On Tuesday Thursday, Sat and Sunday     Orders Placed This Encounter  Procedures  . Tdap vaccine greater than or equal to 7yo IM  . Basic metabolic panel  . AMB REFERRAL FOR DME    No Follow-up on file.

## 2014-10-14 NOTE — Progress Notes (Signed)
Pre-visit discussion using our clinic review tool. No additional management support is needed unless otherwise documented below in the visit note.  

## 2014-10-14 NOTE — Patient Instructions (Addendum)
Reduce your metoprolol   to one half tablet  ONCE DAILY IN THE EVENING   HAVE THE RN CHECK YOUR BLOOD PRESSURE AND AND PULSE NEXT WEEK .

## 2014-10-17 NOTE — Assessment & Plan Note (Signed)
Persistent due to medication .  Explained to patient that taking Toprol XL twice daily was incorrect and did not effectively reduce his dose as directed by Dr Clayborn Bigness.  Adviwed to take 12.5 mg ONCE DAILY

## 2014-10-17 NOTE — Assessment & Plan Note (Addendum)
Coumadin level is checked at [facility] and relayed to me monthly.  He is due.  Will Continue current regimen and repeat PT/INR   Lab Results  Component Value Date   INR 2.4* 09/01/2014   INR 4.8* 06/25/2014   INR 4.8* 06/22/2014   PROTIME 50.8* 06/25/2014

## 2014-10-17 NOTE — Assessment & Plan Note (Addendum)
Caused by degenerative changes and scoliosis, with progressive disability noted,  Patient now having difficulty rising from seated position and using a walker to ambulate  Now using cymbalta and gabapentin for control of pain .and neuropathy .  He needs a lift chair to assist him in rising

## 2014-10-17 NOTE — Assessment & Plan Note (Signed)
managed with amlodipine and lose dose Toprol.  He has normal renal function per last check in March,  Cr was 0.95. He is due for assessment of renal function and electrolytes   Lab Results  Component Value Date   CREATININE 11.0* 03/02/2014   CREATININE 1.0 03/02/2014     Lab Results  Component Value Date   NA 140 03/02/2014   K 4.8 03/02/2014   CL 102 12/16/2012   CO2 30 12/16/2012

## 2014-10-19 LAB — CBC AND DIFFERENTIAL
HEMATOCRIT: 38 % — AB (ref 41–53)
Hemoglobin: 12.4 g/dL — AB (ref 13.5–17.5)
Neutrophils Absolute: 3 /uL
Platelets: 217 10*3/uL (ref 150–399)
WBC: 4.9 10^3/mL

## 2014-10-19 LAB — HEPATIC FUNCTION PANEL
ALK PHOS: 81 U/L (ref 25–125)
ALT: 21 U/L (ref 10–40)
AST: 34 U/L (ref 14–40)
Bilirubin, Total: 0.5 mg/dL

## 2014-10-19 LAB — PROTIME-INR: PROTIME: 26 s — AB (ref 10.0–13.8)

## 2014-10-19 LAB — BASIC METABOLIC PANEL
BUN: 11 mg/dL (ref 4–21)
CREATININE: 0.9 mg/dL (ref 0.6–1.3)
Glucose: 120 mg/dL
POTASSIUM: 4.3 mmol/L (ref 3.4–5.3)
Sodium: 137 mmol/L (ref 137–147)

## 2014-10-19 LAB — TSH: TSH: 1.68 u[IU]/mL (ref 0.41–5.90)

## 2014-10-19 LAB — POCT INR: INR: 2.5 — AB (ref 0.9–1.1)

## 2014-10-20 ENCOUNTER — Encounter: Payer: Self-pay | Admitting: Internal Medicine

## 2014-10-21 ENCOUNTER — Telehealth: Payer: Self-pay | Admitting: Internal Medicine

## 2014-10-21 NOTE — Telephone Encounter (Signed)
Coumadin level is therapeutic,  Continue current regimen and repeat PT/INR in one month.  Your vitamin D, thyroid , , liver and kidney function are normal.

## 2014-10-21 NOTE — Telephone Encounter (Signed)
Pt notified and verbalized understanding.

## 2014-10-22 ENCOUNTER — Encounter: Payer: Self-pay | Admitting: Internal Medicine

## 2014-11-01 ENCOUNTER — Encounter: Payer: Self-pay | Admitting: Internal Medicine

## 2014-11-09 ENCOUNTER — Encounter: Payer: Self-pay | Admitting: Internal Medicine

## 2014-11-16 LAB — PROTIME-INR

## 2014-11-18 ENCOUNTER — Telehealth: Payer: Self-pay | Admitting: Internal Medicine

## 2014-11-18 DIAGNOSIS — Z7901 Long term (current) use of anticoagulants: Secondary | ICD-10-CM

## 2014-11-18 NOTE — Telephone Encounter (Signed)
There is no note with this?

## 2014-11-18 NOTE — Assessment & Plan Note (Signed)
Coumadin level is low.  Please confirm current  regimen  Is 3 mg daily,  And if so increase dose to 6 mg on Friday and Mondays only,  3 mg all other days.  Repeat INR in 2 weeks at [place]

## 2014-11-18 NOTE — Telephone Encounter (Signed)
Coumadin level is low.  Please confirm current  regimen of 3 mg daily,  If that is correct, increase to 6 mg on Friday and Monday,  3 mg all other days.  Repeat INR at [facility] in 2 weeks

## 2014-11-18 NOTE — Telephone Encounter (Signed)
Confirmed pt is taking Coumadin 3 mg daily. Notified of new regimen,  verbalized understanding

## 2014-12-08 ENCOUNTER — Telehealth: Payer: Self-pay | Admitting: Internal Medicine

## 2014-12-08 ENCOUNTER — Telehealth: Payer: Self-pay | Admitting: *Deleted

## 2014-12-08 NOTE — Telephone Encounter (Signed)
No, he is no longer receiving testosterone injections

## 2014-12-08 NOTE — Telephone Encounter (Signed)
Called patient regarding flu vaccine status, updated chart. He stated he had PT/INR drawn last week at [facility] and had not heard from our office. No telephone note in chart. Called [facility], left VM requesting results to be faxed to our office.

## 2014-12-08 NOTE — Telephone Encounter (Signed)
[facility] called to clarify if patient to continue testosterone injections. Please advise last filled 06/19/2013 .

## 2014-12-08 NOTE — Telephone Encounter (Signed)
[name] at [facility] took message for nurse Cecille Rubin verbalized understanding.

## 2014-12-09 LAB — POCT INR: INR: 3.5 — AB (ref <=1.1)

## 2014-12-09 LAB — PROTIME-INR: Protime: 36.5 seconds — AB (ref 10.0–13.8)

## 2014-12-10 HISTORY — PX: CORONARY ARTERY BYPASS GRAFT: SHX141

## 2014-12-13 ENCOUNTER — Telehealth: Payer: Self-pay | Admitting: Internal Medicine

## 2014-12-13 NOTE — Telephone Encounter (Signed)
Coumadin level is therapeutic,  Continue current regimen and repeat PT/INR in one month 

## 2014-12-13 NOTE — Telephone Encounter (Signed)
Called and advised patient of results,  verbalized understanding. 

## 2014-12-13 NOTE — Telephone Encounter (Signed)
[facility] INR = 3.5 PT 36.5 Please advise. Results in lab folder. Tried to reach patient for coumadin regimen left voicemail. Last regimen in chart is 3 mg everyday except Friday and 2 mg on Friday Please advise.

## 2014-12-20 ENCOUNTER — Ambulatory Visit: Payer: Self-pay | Admitting: Cardiology

## 2014-12-20 LAB — CBC WITH DIFFERENTIAL/PLATELET
BASOS ABS: 0 10*3/uL (ref 0.0–0.1)
BASOS PCT: 1.1 %
EOS ABS: 0.2 10*3/uL (ref 0.0–0.7)
EOS PCT: 3.8 %
HCT: 38.1 % — ABNORMAL LOW (ref 40.0–52.0)
HGB: 12.8 g/dL — ABNORMAL LOW (ref 13.0–18.0)
Lymphocyte #: 1 10*3/uL (ref 1.0–3.6)
Lymphocyte %: 22.6 %
MCH: 33.8 pg (ref 26.0–34.0)
MCHC: 33.5 g/dL (ref 32.0–36.0)
MCV: 101 fL — AB (ref 80–100)
MONO ABS: 0.5 x10 3/mm (ref 0.2–1.0)
MONOS PCT: 12.1 %
NEUTROS PCT: 60.4 %
Neutrophil #: 2.7 10*3/uL (ref 1.4–6.5)
PLATELETS: 203 10*3/uL (ref 150–440)
RBC: 3.78 10*6/uL — ABNORMAL LOW (ref 4.40–5.90)
RDW: 12.9 % (ref 11.5–14.5)
WBC: 4.4 10*3/uL (ref 3.8–10.6)

## 2014-12-20 LAB — BASIC METABOLIC PANEL
Anion Gap: 6 — ABNORMAL LOW (ref 7–16)
BUN: 10 mg/dL (ref 7–18)
CHLORIDE: 104 mmol/L (ref 98–107)
CREATININE: 0.89 mg/dL (ref 0.60–1.30)
Calcium, Total: 9.4 mg/dL (ref 8.5–10.1)
Co2: 30 mmol/L (ref 21–32)
EGFR (Non-African Amer.): >60
GLUCOSE: 76 mg/dL (ref 65–99)
Osmolality: 277 (ref 275–301)
Potassium: 4.5 mmol/L (ref 3.5–5.1)
Sodium: 140 mmol/L (ref 136–145)

## 2014-12-20 LAB — PROTIME-INR
INR: 1.7
PROTHROMBIN TIME: 19.5 s — AB (ref 11.5–14.7)

## 2014-12-20 LAB — APTT: Activated PTT: 50.7 secs — ABNORMAL HIGH (ref 23.6–35.9)

## 2014-12-22 ENCOUNTER — Ambulatory Visit: Payer: Self-pay | Admitting: Cardiology

## 2014-12-22 HISTORY — PX: PERMANENT PACEMAKER INSERTION: SHX6023

## 2015-01-04 ENCOUNTER — Encounter: Payer: Self-pay | Admitting: Internal Medicine

## 2015-01-18 LAB — POCT INR: INR: 2.9 — AB (ref 0.9–1.1)

## 2015-01-19 ENCOUNTER — Other Ambulatory Visit: Payer: Self-pay | Admitting: Internal Medicine

## 2015-01-20 ENCOUNTER — Ambulatory Visit (INDEPENDENT_AMBULATORY_CARE_PROVIDER_SITE_OTHER): Payer: Medicare Other | Admitting: Internal Medicine

## 2015-01-20 ENCOUNTER — Encounter: Payer: Self-pay | Admitting: Internal Medicine

## 2015-01-20 VITALS — BP 120/50 | HR 62 | Temp 97.5°F | Resp 14 | Ht 68.0 in | Wt 147.5 lb

## 2015-01-20 DIAGNOSIS — R51 Headache: Secondary | ICD-10-CM

## 2015-01-20 DIAGNOSIS — F329 Major depressive disorder, single episode, unspecified: Secondary | ICD-10-CM

## 2015-01-20 DIAGNOSIS — F32A Depression, unspecified: Secondary | ICD-10-CM

## 2015-01-20 DIAGNOSIS — Z7901 Long term (current) use of anticoagulants: Secondary | ICD-10-CM

## 2015-01-20 DIAGNOSIS — I429 Cardiomyopathy, unspecified: Secondary | ICD-10-CM

## 2015-01-20 DIAGNOSIS — D509 Iron deficiency anemia, unspecified: Secondary | ICD-10-CM

## 2015-01-20 DIAGNOSIS — G8929 Other chronic pain: Secondary | ICD-10-CM

## 2015-01-20 DIAGNOSIS — I10 Essential (primary) hypertension: Secondary | ICD-10-CM

## 2015-01-20 DIAGNOSIS — R0609 Other forms of dyspnea: Secondary | ICD-10-CM

## 2015-01-20 MED ORDER — AMLODIPINE BESYLATE 10 MG PO TABS: 10.0000 mg | ORAL_TABLET | Freq: Every day | ORAL | Status: DC

## 2015-01-20 NOTE — Patient Instructions (Addendum)
Your Coumadin level is therapeutic,  Continue current regimen and repeat PT/INR in one month  You can taper off of the Neurontin (gabapetnin) by reducing dose to 1 pill daily for a week, then stop  If your pain is worse.  Resume your previous dose gradually.

## 2015-01-20 NOTE — Progress Notes (Signed)
Pre-visit discussion using our clinic review tool. No additional management support is needed unless otherwise documented below in the visit note.  

## 2015-01-20 NOTE — Progress Notes (Signed)
Patient ID: Kevin [name], male   DOB: 1925/04/05, 81 y.o.   MRN: [id number]  Patient Active Problem List   Diagnosis Date Noted  . Exertional dyspnea 06/25/2014  . Macular rash 02/15/2014  . Second degree AV block, Mobitz type I 11/20/2013  . Secondary cardiomyopathy 11/18/2013  . Hyperlipidemia 04/27/2013  . Sciatica of right side 03/16/2013  . Other and unspecified hyperlipidemia 03/16/2013  . Anemia, iron deficiency 09/15/2012  . Leg pain, right 09/15/2012  . Osteoporosis, senile 09/15/2012  . Atrial flutter 06/26/2012  . Esophageal reflux 02/21/2012  . Depression 12/24/2011  . Testosterone deficiency 09/23/2011  . Hypertension 08/25/2011  . Neuropathy of lower extremity 08/25/2011  . Atrial fibrillation   . Chronic headaches   . History of mitral valve replacement with mechanical valve   . Long term current use of anticoagulant therapy 08/22/2011    Subjective:  CC:   Chief Complaint  Patient presents with  . Follow-up    general follow up    HPI:   Kevin [name] is a 81 y.o. male who presents for  Follow up on chronic issues atrial fibrillation complicated by bradycardia and 2nd degree AV Block ,  Chronic back pain with sciatica secondary to severe scoliosis and DDD, and hyperlipidemia.  He underwent pacemaker implantation by Miquel Dunn for persistent bradycardia accompanied by exercise intolerance in  Late January and has already noted an improvement in his exertional dyspnea.  He reports muscle weakness , however, and is continuing to work with PT to improve his strength and endurane.  He is wondering if he should remove the Steri Strips over his chest wall incision.      Past Medical History  Diagnosis Date  . Atrial fibrillation   . Sciatica   . Chronic headaches started age 75  . History of mitral valve replacement with mechanical valve   . Sciatica   . Osteoporosis     secondary to low testoerone  . Cancer     melanoma- right ear  . GERD  (gastroesophageal reflux disease)   . Glaucoma   . Hypertension     Past Surgical History  Procedure Laterality Date  . Nasal septum surgery  1963  . Cataract extraction, bilateral      x 2  . Melanoma excision      right ear  . Mitral valve replacement  1994  . Hernia repair      x 2  . Cardioversion  2013  . Permanent pacemaker insertion Left 12/22/2014       The following portions of the patient's history were reviewed and updated as appropriate: Allergies, current medications, and problem list.    Review of Systems:   Patient denies headache, fevers, malaise, unintentional weight loss, skin rash, eye pain, sinus congestion and sinus pain, sore throat, dysphagia,  hemoptysis , cough, dyspnea, wheezing, chest pain, palpitations, orthopnea, edema, abdominal pain, nausea, melena, diarrhea, constipation, flank pain, dysuria, hematuria, urinary  Frequency, nocturia, numbness, tingling, seizures,  Focal weakness, Loss of consciousness,  Tremor, insomnia, depression, anxiety, and suicidal ideation.     History   Social History  . Marital Status: Widowed    Spouse Name: N/A  . Number of Children: N/A  . Years of Education: N/A   Occupational History  . Not on file.   Social History Main Topics  . Smoking status: Never Smoker   . Smokeless tobacco: Never Used  . Alcohol Use: No  . Drug Use: No  . Sexual  Activity: Not on file   Other Topics Concern  . Not on file   Social History Narrative   Exercise; light 3 x week   Lives alone, widowed at [facility]          Objective:  Filed Vitals:   01/20/15 0911  BP: 120/50  Pulse: 62  Temp: 97.5 F (36.4 C)  Resp: 14     General appearance: alert, cooperative and appears stated age Ears: normal TM's and external ear canals both ears Throat: lips, mucosa, and tongue normal; teeth and gums normal Neck: no adenopathy, no carotid bruit, supple, symmetrical, trachea midline and thyroid not enlarged, symmetric, no  tenderness/mass/nodules Back: symmetric, no curvature. ROM normal. No CVA tenderness. Lungs: clear to auscultation bilaterally Heart: regular rate and rhythm, S1, S2 normal, no murmur, click, rub or gallop Abdomen: soft, non-tender; bowel sounds normal; no masses,  no organomegaly Pulses: 2+ and symmetric Skin: Skin color, texture, turgor normal. No rashes or lesions Lymph nodes: Cervical, supraclavicular, and axillary nodes normal.  Assessment and Plan:  Secondary cardiomyopathy Secondary to mitral valve dysfunction, , s.p mechanical valve replacement and now pacemaker implantation for persistent bradycardia secondary to 2nd degree AV Block.  Currently asymptomatic,   Long term current use of anticoagulant therapy Coumadin level is therapeutic,  Continue current regimen and repeat PT/INR in one month./  Lab Results  Component Value Date   INR 2.9* 01/18/2015   INR 3.5* 12/09/2014   INR 2.5* 10/19/2014   PROTIME 36.5* 12/09/2014   PROTIME 26.0* 10/19/2014   PROTIME 50.8* 06/25/2014      Hypertension Well controlled on current regimen. Renal function stable, no changes today.  Lab Results  Component Value Date   CREATININE 0.9 10/19/2014   Lab Results  Component Value Date   NA 137 10/19/2014   K 4.3 10/19/2014   CL 102 12/16/2012   CO2 30 12/16/2012      Exertional dyspnea Improved with synchronization via pacemaker implant January 2016.  Encouraged to continue PT to improve lower extremity strength   Chronic headaches Currently resolved, previously fioricet dependent     Anemia, iron deficiency Improved by last check.   No further workup given his age and chronic anticoagulation.  Continue every other day iron supplementation,  Lab Results  Component Value Date   WBC 4.9 10/19/2014   HGB 12.4* 10/19/2014   HCT 38* 10/19/2014   PLT 217 10/19/2014       Depression Mild, complicated by chronic pain.  Symptoms improved with Cymbalta  And  gabapentin   A total of 25 minutes of face to face time was spent with patient more than half of which was spent in counselling on the above mentioned issues.  Updated Medication List Outpatient Encounter Prescriptions as of 01/20/2015  Medication Sig  . 5-Hydroxytryptophan (5-HTP PO) Take by mouth daily.  . ALPHA LIPOIC ACID PO Take by mouth daily.  Marland Kitchen amLODipine (NORVASC) 10 MG tablet Take 1 tablet (10 mg total) by mouth daily.  . Ascorbic Acid (VITAMIN C) 100 MG tablet Take 100 mg by mouth daily.  . brimonidine (ALPHAGAN) 0.15 % ophthalmic solution 2 (two) times daily.   . Calcium-Magnesium-Vitamin D (CALCIUM MAGNESIUM PO) Take by mouth 2 (two) times daily.  . DHA-EPA-Vit B6-B12-Folic Acid (CARDIOVID PLUS) CAPS Take 1 capsule by mouth 2 (two) times daily.   . DULoxetine (CYMBALTA) 20 MG capsule Take 1 capsule by mouth daily.  . ergocalciferol (VITAMIN D2) 50000 UNITS capsule Take 50,000 Units by mouth  once a week.  . ferrous sulfate 324 (65 FE) MG TBEC Take 1 tablet by mouth every other day.  . gabapentin (NEURONTIN) 100 MG capsule Take 1 capsule (100 mg total) by mouth 3 (three) times daily.  . Guggulipid (GUGULIPID PO) Take by mouth daily.  Marland Kitchen ketoconazole (NIZORAL) 2 % cream Apply 1 application topically 2 (two) times daily.   Marland Kitchen KETOPROFEN-KETAMINE-LIDOCAINE EX Apply 1 application topically 2 (two) times daily.  . L-GLUTAMINE PO Take by mouth.    . latanoprost (XALATAN) 0.005 % ophthalmic solution daily.   Marland Kitchen losartan (COZAAR) 100 MG tablet TAKE 1 TABLET BY MOUTH EVERY DAY  . LUMIGAN 0.01 % SOLN 1 drop at bedtime.   . metoprolol succinate (TOPROL-XL) 25 MG 24 hr tablet Take 12.5 mg by mouth daily.  . Multiple Vitamin (MULTIVITAMIN) tablet Take 1 tablet by mouth daily.    . NON FORMULARY Memory Essentials 2 tablets BID.  Marland Kitchen NON FORMULARY Pro Liver Milk Thistle and Acetyl-Cysteine 1 tablet daily.  . Nutritional Supplements (MELATONIN PO) Take by mouth.  . Omega-3 Fatty Acids (OMEGA 3 PO)  Take by mouth.    Marland Kitchen omeprazole (PRILOSEC) 40 MG capsule daily.   Loma Boston Calcium 500 MG TABS Take two by mouth daily   . Red Yeast Rice Extract (RED YEAST RICE PO) Take by mouth.    . RUTIN PO Take by mouth.  . SYRINGE-NEEDLE, DISP, 3 ML (SAFETY-LOK 3CC SYR 22GX1.5") 22G X 1-1/2" 3 ML MISC 2 ml's monthly-given 1 shot monthly   . Tdap (BOOSTRIX) 5-2.5-18.5 LF-MCG/0.5 injection Inject 0.5 mLs into the muscle once.  . triamcinolone cream (KENALOG) 0.1 % daily.   . TURMERIC PO Take by mouth.    . warfarin (COUMADIN) 2 MG tablet Take 1 tablet (2 mg total) by mouth daily. On Monday Wednesday and Friday  . warfarin (COUMADIN) 3 MG tablet Take 1 tablet (3 mg total) by mouth daily. On Tuesday Thursday, Sat and Sunday  . [DISCONTINUED] amLODipine (NORVASC) 10 MG tablet Take 1 tablet (10 mg total) by mouth daily.  . [DISCONTINUED] amLODipine (NORVASC) 10 MG tablet TAKE 1 TABLET BY MOUTH EVERY DAY  . HYDROcodone-acetaminophen (NORCO/VICODIN) 5-325 MG per tablet Take 1 tablet by mouth at bedtime as needed for moderate pain. (Patient not taking: Reported on 01/20/2015)     No orders of the defined types were placed in this encounter.    No Follow-up on file.

## 2015-01-22 ENCOUNTER — Encounter: Payer: Self-pay | Admitting: Internal Medicine

## 2015-01-22 NOTE — Assessment & Plan Note (Signed)
Mild, complicated by chronic pain.  Symptoms improved with Cymbalta  And gabapentin

## 2015-01-22 NOTE — Assessment & Plan Note (Signed)
Coumadin level is therapeutic,  Continue current regimen and repeat PT/INR in one month./  Lab Results  Component Value Date   INR 2.9* 01/18/2015   INR 3.5* 12/09/2014   INR 2.5* 10/19/2014   PROTIME 36.5* 12/09/2014   PROTIME 26.0* 10/19/2014   PROTIME 50.8* 06/25/2014

## 2015-01-22 NOTE — Assessment & Plan Note (Signed)
Currently resolved, previously fioricet dependent

## 2015-01-22 NOTE — Assessment & Plan Note (Signed)
Improved by last check.   No further workup given his age and chronic anticoagulation.  Continue every other day iron supplementation,  Lab Results  Component Value Date   WBC 4.9 10/19/2014   HGB 12.4* 10/19/2014   HCT 38* 10/19/2014   PLT 217 10/19/2014

## 2015-01-22 NOTE — Assessment & Plan Note (Signed)
Secondary to mitral valve dysfunction, , s.p mechanical valve replacement and now pacemaker implantation for persistent bradycardia secondary to 2nd degree AV Block.  Currently asymptomatic,

## 2015-01-22 NOTE — Assessment & Plan Note (Signed)
Well controlled on current regimen. Renal function stable, no changes today.  Lab Results  Component Value Date   CREATININE 0.9 10/19/2014   Lab Results  Component Value Date   NA 137 10/19/2014   K 4.3 10/19/2014   CL 102 12/16/2012   CO2 30 12/16/2012

## 2015-01-22 NOTE — Assessment & Plan Note (Signed)
Improved with synchronization via pacemaker implant January 2016.  Encouraged to continue PT to improve lower extremity strength

## 2015-01-28 ENCOUNTER — Other Ambulatory Visit: Payer: Self-pay | Admitting: Internal Medicine

## 2015-02-04 ENCOUNTER — Other Ambulatory Visit: Payer: Self-pay | Admitting: Cardiovascular Disease

## 2015-02-14 LAB — POCT INR: INR: 3.4 — AB (ref 0.9–1.1)

## 2015-02-15 ENCOUNTER — Telehealth: Payer: Self-pay | Admitting: Internal Medicine

## 2015-02-15 NOTE — Telephone Encounter (Signed)
Coumadin level is therapeutic,  Continue current regimen and repeat PT/INR in one month 

## 2015-02-15 NOTE — Telephone Encounter (Addendum)
PT 36.0 INR 3.4  Coumadin 3 mg 5 days  6 mg 2 days per week

## 2015-02-16 NOTE — Telephone Encounter (Signed)
Patient notified

## 2015-02-21 ENCOUNTER — Ambulatory Visit (INDEPENDENT_AMBULATORY_CARE_PROVIDER_SITE_OTHER): Payer: Medicare Other | Admitting: Cardiovascular Disease

## 2015-02-21 ENCOUNTER — Encounter: Payer: Self-pay | Admitting: Cardiovascular Disease

## 2015-02-21 VITALS — BP 112/54 | HR 68 | Ht 67.0 in | Wt 148.5 lb

## 2015-02-21 DIAGNOSIS — Z952 Presence of prosthetic heart valve: Secondary | ICD-10-CM

## 2015-02-21 DIAGNOSIS — I4891 Unspecified atrial fibrillation: Secondary | ICD-10-CM

## 2015-02-21 DIAGNOSIS — R42 Dizziness and giddiness: Secondary | ICD-10-CM | POA: Diagnosis not present

## 2015-02-21 DIAGNOSIS — R001 Bradycardia, unspecified: Secondary | ICD-10-CM

## 2015-02-21 DIAGNOSIS — I10 Essential (primary) hypertension: Secondary | ICD-10-CM

## 2015-02-21 DIAGNOSIS — E785 Hyperlipidemia, unspecified: Secondary | ICD-10-CM

## 2015-02-21 DIAGNOSIS — I4892 Unspecified atrial flutter: Secondary | ICD-10-CM | POA: Diagnosis not present

## 2015-02-21 DIAGNOSIS — Z954 Presence of other heart-valve replacement: Secondary | ICD-10-CM

## 2015-02-21 NOTE — Patient Instructions (Signed)
You are doing well. No medication changes were made.  Please call us if you have new issues that need to be addressed before your next appt.  Your physician wants you to follow-up in: 6 months.  You will receive a reminder letter in the mail two months in advance. If you don't receive a letter, please call our office to schedule the follow-up appointment.   

## 2015-02-21 NOTE — Assessment & Plan Note (Signed)
Blood pressure is well controlled on today's visit. No changes made to the medications. 

## 2015-02-21 NOTE — Assessment & Plan Note (Signed)
He appears to be maintaining normal sinus rhythm. No medication changes made

## 2015-02-21 NOTE — Assessment & Plan Note (Signed)
Previous intolerance to Lipitor. Currently not on a statin

## 2015-02-21 NOTE — Assessment & Plan Note (Signed)
He reports having follow-up with Dr. Clayborn Bigness in April 2016. Will defer management of his valve to him as he has been his main cardiologist in the past

## 2015-02-21 NOTE — Assessment & Plan Note (Signed)
Etiology of his episodic lightheadedness is unclear. Less likely orthostasis as typically this happens in the bed or while sitting.  Unable to exclude other arrhythmia though he is asymptomatic. Should also consider vertebrobasilar disease. If symptoms get worse or more frequent, should consider MRA. We have discussed this with him.

## 2015-02-21 NOTE — Progress Notes (Signed)
Patient ID: Kevin [name], male    DOB: [birth year], 81 y.o.   MRN: [id number]  HPI Comments: Kevin [name] is a very pleasant 81 year old gentleman, patient of Dr. Derrel Nip, also seen by Dr. Clayborn Bigness, with a history of mitral valve replacement with St. Jude valve in 1994, history of atrial fibrillation, mild restriction on pulmonary function tests who presented in 2014 to our office with worsening shortness of breath, found to be in atrial flutter.  cardioversion was performed and he converted to normal sinus rhythm with one shock. He presents today for routine follow-up. We have not seen him since 12/ 2014 in follow-up today, he reports having symptomatic bradycardia 12/2014. Notes indicate a Holter monitor confirming bradycardia. Pacemaker was placed by Dr.  Josefa Half at [facility].   his 2 main issues are severe right thigh pain, walks with a walker and a cane . Other issue is rare episodes of lightheadedness that come typically in a sitting position . He has episodes every 4-6 weeks lasting 30 seconds to 1 minute, sometimes with short headache .symptoms resolved without intervention . 2 weeks ago had a severe episode. He was exercising in the bed, had acute onset of lightheadedness, nausea, general malaise. Symptoms lasted for much longer period of time before resolving . He did not call nursing at [facility] . He denies having any vertigo Otherwise he reports he is doing well   EKG on today's visit shows AV paced rhythm, rate 68 up to 77 bpm  EKG from [facility] dated 12/20/2014 showing sinus bradycardia rate 49 bpm  Atrial flutter with slow ventricular response rate of 50 seen 06/13/2012 Atrial flutter with slow ventricular response rate 47 seen June 03 2012 Details of the report he had a Medtronic dual-chamber pacemaker [id number] model   other past medical history  echocardiogram may 2014 showing ejection fraction 35-40%, left atrial enlargement. Previous echocardiogram showed ejection fraction 50% He  reports that he stopped his Lipitor as it made him feel nervous, he also had sweating.  Previous  CT scan did not show any PE Chest x-ray showed prominent pulmonary arteries otherwise no prominent new findings He had a transesophageal echo 04/30/2012 that was essentially normal documenting normal functioning mitral valve   Allergies  Allergen Reactions  . Aspirin     Heart operation was told to never take Aspirin    Outpatient Encounter Prescriptions as of 02/21/2015  Medication Sig  . 5-Hydroxytryptophan (5-HTP PO) Take by mouth daily.  . ALPHA LIPOIC ACID PO Take by mouth daily.  Marland Kitchen amLODipine (NORVASC) 10 MG tablet Take 1 tablet (10 mg total) by mouth daily.  . Ascorbic Acid (VITAMIN C) 100 MG tablet Take 100 mg by mouth daily.  . brimonidine (ALPHAGAN) 0.15 % ophthalmic solution 2 (two) times daily.   . Calcium-Magnesium-Vitamin D (CALCIUM MAGNESIUM PO) Take by mouth 2 (two) times daily.  . DHA-EPA-Vit B6-B12-Folic Acid (CARDIOVID PLUS) CAPS Take 1 capsule by mouth 2 (two) times daily.   . DULoxetine (CYMBALTA) 20 MG capsule Take 1 capsule by mouth daily.  . ergocalciferol (VITAMIN D2) 50000 UNITS capsule Take 50,000 Units by mouth once a week.  . ferrous sulfate 324 (65 FE) MG TBEC Take 1 tablet by mouth every other day.  . gabapentin (NEURONTIN) 100 MG capsule Take 1 capsule (100 mg total) by mouth 3 (three) times daily.  . Guggulipid (GUGULIPID PO) Take by mouth daily.  Marland Kitchen HYDROcodone-acetaminophen (NORCO/VICODIN) 5-325 MG per tablet Take 1 tablet by mouth at bedtime as  needed for moderate pain.  Marland Kitchen ketoconazole (NIZORAL) 2 % cream Apply 1 application topically 2 (two) times daily.   Marland Kitchen KETOPROFEN-KETAMINE-LIDOCAINE EX Apply 1 application topically 2 (two) times daily.  . L-GLUTAMINE PO Take by mouth.    . latanoprost (XALATAN) 0.005 % ophthalmic solution daily.   Marland Kitchen losartan (COZAAR) 100 MG tablet TAKE 1 TABLET BY MOUTH EVERY DAY  . LUMIGAN 0.01 % SOLN 1 drop at bedtime.   .  metoprolol succinate (TOPROL-XL) 25 MG 24 hr tablet Take 12.5 mg by mouth daily.  . metoprolol tartrate (LOPRESSOR) 25 MG tablet TAKE 1 TABLET BY MOUTH TWICE DAILY  . Multiple Vitamin (MULTIVITAMIN) tablet Take 1 tablet by mouth daily.    . NON FORMULARY Memory Essentials 2 tablets BID.  Marland Kitchen NON FORMULARY Pro Liver Milk Thistle and Acetyl-Cysteine 1 tablet daily.  . Nutritional Supplements (MELATONIN PO) Take by mouth.  . Omega-3 Fatty Acids (OMEGA 3 PO) Take by mouth.    Marland Kitchen omeprazole (PRILOSEC) 40 MG capsule daily.   Loma Boston Calcium 500 MG TABS Take two by mouth daily   . Red Yeast Rice Extract (RED YEAST RICE PO) Take by mouth.    . RUTIN PO Take by mouth.  . SYRINGE-NEEDLE, DISP, 3 ML (SAFETY-LOK 3CC SYR 22GX1.5") 22G X 1-1/2" 3 ML MISC 2 ml's monthly-given 1 shot monthly   . Tdap (BOOSTRIX) 5-2.5-18.5 LF-MCG/0.5 injection Inject 0.5 mLs into the muscle once.  . triamcinolone cream (KENALOG) 0.1 % daily.   . TURMERIC PO Take by mouth.    . warfarin (COUMADIN) 2 MG tablet Take 1 tablet (2 mg total) by mouth daily. On Monday Wednesday and Friday  . warfarin (COUMADIN) 3 MG tablet Take 1 tablet (3 mg total) by mouth daily. On Tuesday Thursday, Sat and Sunday    Past Medical History  Diagnosis Date  . Atrial fibrillation   . Sciatica   . Chronic headaches started age 89  . History of mitral valve replacement with mechanical valve   . Sciatica   . Osteoporosis     secondary to low testoerone  . Cancer     melanoma- right ear  . GERD (gastroesophageal reflux disease)   . Glaucoma   . Hypertension     Past Surgical History  Procedure Laterality Date  . Nasal septum surgery  1963  . Cataract extraction, bilateral      x 2  . Melanoma excision      right ear  . Mitral valve replacement  1994  . Hernia repair      x 2  . Cardioversion  2013  . Permanent pacemaker insertion Left 12/22/2014    Social History  reports that he has never smoked. He has never used smokeless  tobacco. He reports that he does not drink alcohol or use illicit drugs.  Family History family history includes Alzheimer's disease in his brother; Arrhythmia in his brother; Cancer in his father; Mental illness in his mother.   Review of Systems  Constitutional: Negative.   Respiratory: Negative.   Cardiovascular: Negative.   Gastrointestinal: Negative.   Musculoskeletal: Positive for myalgias.       Right thigh pain  Skin: Negative.   Neurological: Positive for light-headedness.  Hematological: Negative.   Psychiatric/Behavioral: Negative.   All other systems reviewed and are negative.   BP 112/54 mmHg  Pulse 68  Ht 5\' 7"  (1.702 m)  Wt 148 lb 8 oz (67.359 kg)  BMI 23.25 kg/m2  Physical Exam  Constitutional: He is oriented to person, place, and time. He appears well-developed and well-nourished.  HENT:  Head: Normocephalic.  Nose: Nose normal.  Mouth/Throat: Oropharynx is clear and moist.  Eyes: Conjunctivae are normal. Pupils are equal, round, and reactive to light.  Neck: Normal range of motion. Neck supple. No JVD present.  Cardiovascular: Regular rhythm, S1 normal, S2 normal, normal heart sounds and intact distal pulses.  Bradycardia present.  Exam reveals no gallop and no friction rub.   No murmur heard. Pulmonary/Chest: Effort normal and breath sounds normal. No respiratory distress. He has no wheezes. He has no rales. He exhibits no tenderness.  Abdominal: Soft. Bowel sounds are normal. He exhibits no distension. There is no tenderness.  Musculoskeletal: Normal range of motion. He exhibits no edema or tenderness.  Lymphadenopathy:    He has no cervical adenopathy.  Neurological: He is alert and oriented to person, place, and time. Coordination normal.  Skin: Skin is warm and dry. No rash noted. No erythema.  Psychiatric: He has a normal mood and affect. His behavior is normal. Judgment and thought content normal.      Assessment and Plan   Nursing note and  vitals reviewed.

## 2015-02-21 NOTE — Assessment & Plan Note (Signed)
Bradycardia or 2016, pacemaker placed by [facility]. Presumably this will be monitored through their office

## 2015-03-04 ENCOUNTER — Encounter: Payer: Self-pay | Admitting: *Deleted

## 2015-03-04 ENCOUNTER — Other Ambulatory Visit: Payer: Self-pay | Admitting: *Deleted

## 2015-03-04 ENCOUNTER — Other Ambulatory Visit: Payer: Self-pay | Admitting: Cardiovascular Disease

## 2015-03-04 DIAGNOSIS — I10 Essential (primary) hypertension: Secondary | ICD-10-CM | POA: Insufficient documentation

## 2015-03-04 DIAGNOSIS — K219 Gastro-esophageal reflux disease without esophagitis: Secondary | ICD-10-CM | POA: Insufficient documentation

## 2015-03-04 DIAGNOSIS — Z952 Presence of prosthetic heart valve: Secondary | ICD-10-CM | POA: Insufficient documentation

## 2015-03-14 ENCOUNTER — Other Ambulatory Visit: Payer: Self-pay | Admitting: Internal Medicine

## 2015-03-22 ENCOUNTER — Encounter: Payer: Self-pay | Admitting: *Deleted

## 2015-03-22 ENCOUNTER — Telehealth: Payer: Self-pay | Admitting: Internal Medicine

## 2015-03-22 LAB — PROTIME-INR: Protime: 37.4 seconds — AB (ref 10.0–13.8)

## 2015-03-22 LAB — POCT INR: INR: 3.6 — AB (ref 0.9–1.1)

## 2015-03-22 NOTE — Telephone Encounter (Signed)
INR is too high.  Decrease dose to 5 mg daily and repeat in one week . Send nex rx if needed for 5 mg  #90

## 2015-03-22 NOTE — Telephone Encounter (Signed)
Left message for patient to return call to office. 

## 2015-03-22 NOTE — Telephone Encounter (Signed)
Patient PT=37.4 and INR= 3.6 patient current regimen is  Coumadin 3 mg 5 days 6 mg 2 days per week. Please advise have placed copy of results in yellow folder.

## 2015-03-22 NOTE — Telephone Encounter (Signed)
Patient notified and voiced understanding. Patient does not require new script patient repeated dosing for coumadin to voice understanding.

## 2015-03-23 ENCOUNTER — Telehealth: Payer: Self-pay | Admitting: Internal Medicine

## 2015-03-29 ENCOUNTER — Telehealth: Payer: Self-pay | Admitting: Internal Medicine

## 2015-03-29 LAB — POCT INR: INR: 4.9 — AB (ref <=1.1)

## 2015-03-29 LAB — PROTIME-INR: Protime: 51.6 seconds — AB (ref 10.0–13.8)

## 2015-03-29 NOTE — Telephone Encounter (Signed)
Patient notified and voiced understanding with read back of new regimen.

## 2015-03-29 NOTE — Telephone Encounter (Signed)
[employer] faxed over PT and INR . PT = 4.9 INR = 51.6 , Coumadin 5 mg daily, for last week, patient reports no new supplements.

## 2015-03-29 NOTE — Telephone Encounter (Signed)
Stop the coumadin until Thursday resume at  4 mg daily,  Recheck INR  Next Thursday

## 2015-03-29 NOTE — Telephone Encounter (Signed)
Left message for patient to return call to office. 

## 2015-04-07 LAB — POCT INR: INR: 2.2 — AB (ref <=1.1)

## 2015-04-07 LAB — PROTIME-INR: Protime: 23.4 seconds — AB (ref 10.0–13.8)

## 2015-04-08 ENCOUNTER — Telehealth: Payer: Self-pay | Admitting: Internal Medicine

## 2015-04-08 ENCOUNTER — Encounter: Payer: Self-pay | Admitting: *Deleted

## 2015-04-08 NOTE — Telephone Encounter (Signed)
Patient notified and voiced understanding.

## 2015-04-08 NOTE — Telephone Encounter (Signed)
Patient INR= 2.2 patient coumadin regimen is currently 4 mg daily and usually check inr weekly due to patient inr have been unstable the last month. Please advise.

## 2015-04-08 NOTE — Telephone Encounter (Signed)
Left message for patient to return call to office. 

## 2015-04-08 NOTE — Telephone Encounter (Signed)
Thank you, Level is therapeutic. Check next week. Thanks!

## 2015-04-10 NOTE — Op Note (Signed)
PATIENT NAME:  Kevin [name], Kevin [name] MR#:  [id number] DATE OF BIRTH:  October 27, 1925  DATE OF PROCEDURE:  12/22/2014  PRIMARY CARE PHYSICIAN: Deborra Medina, MD   PREPROCEDURE DIAGNOSIS: Type 2 second-degree atrioventricular block.   PROCEDURE: Dual-chamber pacemaker implantation.   POSTPROCEDURE DIAGNOSIS: Atrial sensing with ventricular pacing.   INDICATION FOR PROCEDURE: The patient is an 81 year old gentleman with known coronary artery disease, ischemic cardiomyopathy, with chief complaint of exertional dyspnea, generalized weakness with fatigue. Holter monitor revealed episodes of bradycardia with type 2 second-degree AV block.   DESCRIPTION OF PROCEDURE: The risks, benefits, and alternatives of pacemaker implantation were explained to the patient and informed written consent was obtained.   DESCRIPTION OF PROCEDURE: He was brought to the operating room in a fasting state. The left pectoral region was prepped and draped in the usual sterile manner. Anesthesia was obtained with 1% Xylocaine locally. A 6 cm incision was performed over the left pectoral region. Access was obtained to the left subclavian vein by fine needle aspiration. MRI compatible right ventricular and right atrial leads were positioned at the right ventricular apical septum and right atrial appendage under fluoroscopic guidance. After proper thresholds were obtained, the leads were sutured in place. The leads were connected to an MRI-compatible, dual-chamber rate responsive pacemaker generator (Medtronic [id number]) and positioned into the pocket. The pocket was closed with 2-0 and 4-0 Vicryl, respectively. Steri-Strips and pressure dressing were applied    ____________________________ Isaias Cowman, MD ap:mw D: 12/22/2014 13:33:01 ET T: 12/22/2014 16:20:15 ET JOB#: [id number]  cc: Isaias Cowman, MD, <Dictator> Isaias Cowman MD ELECTRONICALLY SIGNED 12/29/2014 18:06

## 2015-04-18 LAB — POCT INR: INR: 3 — AB (ref 0.9–1.1)

## 2015-04-18 LAB — PROTIME-INR: PROTIME: 31.8 s — AB (ref 10.0–13.8)

## 2015-04-19 ENCOUNTER — Telehealth: Payer: Self-pay | Admitting: Internal Medicine

## 2015-04-19 ENCOUNTER — Encounter: Payer: Self-pay | Admitting: *Deleted

## 2015-04-19 NOTE — Telephone Encounter (Signed)
Patient notified and voiced understanding.

## 2015-04-19 NOTE — Telephone Encounter (Signed)
Coumadin level is therapeutic,  Continue current regimen and repeat PT/INR in one month 

## 2015-04-19 NOTE — Telephone Encounter (Signed)
Patient on 4 mg coumadin daily INR=3.0 please advise.

## 2015-05-03 LAB — POCT INR: INR: 3.4 — AB (ref 0.9–1.1)

## 2015-05-03 LAB — PROTIME-INR: Protime: 36.1 seconds — AB (ref 10.0–13.8)

## 2015-05-04 ENCOUNTER — Encounter: Payer: Self-pay | Admitting: *Deleted

## 2015-05-10 ENCOUNTER — Telehealth: Payer: Self-pay | Admitting: Internal Medicine

## 2015-05-10 NOTE — Telephone Encounter (Signed)
Patient notified and voiced understanding.

## 2015-05-10 NOTE — Telephone Encounter (Signed)
Coumadin level is therapeutic,  Continue current regimen and repeat PT/INR in one month 

## 2015-05-23 ENCOUNTER — Other Ambulatory Visit: Payer: Self-pay | Admitting: *Deleted

## 2015-05-23 MED ORDER — WARFARIN SODIUM 2 MG PO TABS: 2.0000 mg | ORAL_TABLET | Freq: Every day | ORAL | Status: DC

## 2015-05-24 LAB — POCT INR: INR: 4 — AB (ref <=1.1)

## 2015-05-24 LAB — PROTIME-INR: PROTIME: 41.9 s — AB (ref 10.0–13.8)

## 2015-05-26 ENCOUNTER — Telehealth: Payer: Self-pay | Admitting: Internal Medicine

## 2015-05-26 MED ORDER — COENZYME Q10 30 MG PO CAPS: 30.0000 mg | ORAL_CAPSULE | Freq: Every day | ORAL | Status: DC

## 2015-05-26 NOTE — Telephone Encounter (Signed)
Patient  INR= 4.0 and PT = 41.9 Patient coumadin regimen is 4 mg daily please advise.

## 2015-05-26 NOTE — Telephone Encounter (Signed)
Patient tnoified and voiced understanding patient has been taking Co Q 10 30 mg daily only new medication.

## 2015-05-26 NOTE — Telephone Encounter (Signed)
INR is high,  Any new herbal meds?  Hold dose tonight resume at 3 mg alternating with 4 mg daily ,  rechekc 1 week

## 2015-05-30 ENCOUNTER — Telehealth: Payer: Self-pay | Admitting: Internal Medicine

## 2015-06-03 ENCOUNTER — Encounter: Payer: Self-pay | Admitting: *Deleted

## 2015-06-03 LAB — PROTIME-INR: Protime: 24.2 seconds — AB (ref 10.0–13.8)

## 2015-06-03 LAB — POCT INR: INR: 2.3 — AB (ref 0.9–1.1)

## 2015-06-09 ENCOUNTER — Telehealth: Payer: Self-pay | Admitting: Internal Medicine

## 2015-06-09 NOTE — Telephone Encounter (Signed)
Coumadin level is low.  Please resume 4 mg daily all days exccept 3 mg on Sundays, idays and repeat inr in one weelk

## 2015-06-09 NOTE — Telephone Encounter (Signed)
Patient notified and voiced understanding.

## 2015-06-16 ENCOUNTER — Encounter: Payer: Self-pay | Admitting: *Deleted

## 2015-06-16 LAB — PROTIME-INR: PROTIME: 34.6 s — AB (ref 10.0–13.8)

## 2015-06-16 LAB — POCT INR: INR: 3.3 — AB (ref 0.9–1.1)

## 2015-07-04 ENCOUNTER — Telehealth: Payer: Self-pay

## 2015-07-04 ENCOUNTER — Other Ambulatory Visit: Payer: Self-pay | Admitting: Internal Medicine

## 2015-07-04 MED ORDER — WARFARIN SODIUM 2 MG PO TABS: ORAL_TABLET | ORAL | Status: DC

## 2015-07-04 NOTE — Telephone Encounter (Signed)
Clarify warfarin dosage.  Patient currently getting 1 mg tablets, but is on 2mg , he is running out of pills, can we order a different dose for him? Please advise.

## 2015-07-04 NOTE — Telephone Encounter (Signed)
Per telephone note dated June 30 his coumadin regimen is  4 mg all days but Wednesday,  And 3 mg on wednesday.  So I refilled the 2 mg tablet for #60 tablets per month,

## 2015-07-06 LAB — PROTIME-INR: PROTIME: 34.4 s — AB (ref 10.0–13.8)

## 2015-07-06 LAB — POCT INR: INR: 3.3 — AB (ref 0.9–1.1)

## 2015-07-07 ENCOUNTER — Telehealth: Payer: Self-pay | Admitting: Internal Medicine

## 2015-07-07 NOTE — Telephone Encounter (Signed)
Coumadin level is therapeutic,  Continue current regimen and repeat PT/INR in one month 

## 2015-07-07 NOTE — Telephone Encounter (Signed)
Patient INR = 3.3 Current dosage per chart is 4 mg all days except Sunday and 3 mg on Sunday please advise

## 2015-07-07 NOTE — Telephone Encounter (Signed)
Left message for patient to call office.  

## 2015-07-07 NOTE — Telephone Encounter (Signed)
Patient was sent my chart message by MD,

## 2015-07-08 ENCOUNTER — Telehealth: Payer: Self-pay

## 2015-07-08 NOTE — Telephone Encounter (Signed)
Patient notified of results.

## 2015-07-08 NOTE — Telephone Encounter (Signed)
Patient left message requesting a call back.

## 2015-07-10 NOTE — Telephone Encounter (Signed)
  I have reviewed the above information and agree with above.   Teresa Tullo, MD 

## 2015-07-13 ENCOUNTER — Telehealth: Payer: Self-pay | Admitting: Internal Medicine

## 2015-07-21 ENCOUNTER — Encounter: Payer: Self-pay | Admitting: Internal Medicine

## 2015-07-21 ENCOUNTER — Ambulatory Visit (INDEPENDENT_AMBULATORY_CARE_PROVIDER_SITE_OTHER): Payer: Medicare Other | Admitting: Internal Medicine

## 2015-07-21 VITALS — BP 102/50 | HR 68 | Temp 97.8°F | Resp 12 | Ht 67.0 in | Wt 147.8 lb

## 2015-07-21 DIAGNOSIS — I10 Essential (primary) hypertension: Secondary | ICD-10-CM

## 2015-07-21 DIAGNOSIS — R42 Dizziness and giddiness: Secondary | ICD-10-CM

## 2015-07-21 DIAGNOSIS — M5431 Sciatica, right side: Secondary | ICD-10-CM | POA: Diagnosis not present

## 2015-07-21 MED ORDER — HYDROCODONE-ACETAMINOPHEN 5-325 MG PO TABS: 1.0000 | ORAL_TABLET | Freq: Every evening | ORAL | Status: DC | PRN

## 2015-07-21 MED ORDER — AMLODIPINE BESYLATE 5 MG PO TABS: 5.0000 mg | ORAL_TABLET | Freq: Every day | ORAL | Status: DC

## 2015-07-21 NOTE — Progress Notes (Signed)
Subjective:  Patient ID: Kevin [name], male    DOB: 1925-04-18  Age: 81 y.o. MRN: [id number]  CC: The primary encounter diagnosis was Benign essential HTN. Diagnoses of Episodic lightheadedness and Sciatica of right side were also pertinent to this visit.  HPI SEVERN GODDARD presents for follow up on chronic issues including bradycardia with sick sinus syndrome, chronic back pain with sciatica secondary to scoliosis, DDD and osteoporosis,  Hypertension, and neuropathy.    S/p pacer in February.  Feels less tired since the procedure.   Right thigh pain still a problem from spinal stenosis form severe scoliosis     DO recommends continued use of support belt to keep spine in alignment .  Wearing it for about 8 hours .  Sometime woken by pain   Recurrent brief episodes of positional vertigo lasting > 10 minutes.  No falls.   Outpatient Prescriptions Prior to Visit  Medication Sig Dispense Refill  . 5-Hydroxytryptophan (5-HTP PO) Take by mouth daily.    . ALPHA LIPOIC ACID PO Take by mouth daily.    . Ascorbic Acid (VITAMIN C) 100 MG tablet Take 100 mg by mouth daily.    . brimonidine (ALPHAGAN) 0.15 % ophthalmic solution 2 (two) times daily.     . Calcium-Magnesium-Vitamin D (CALCIUM MAGNESIUM PO) Take by mouth 2 (two) times daily.    Marland Kitchen co-enzyme Q-10 30 MG capsule Take 1 capsule (30 mg total) by mouth daily. 90 capsule 3  . DHA-EPA-Vit B6-B12-Folic Acid (CARDIOVID PLUS) CAPS Take 1 capsule by mouth 2 (two) times daily.     . DULoxetine (CYMBALTA) 20 MG capsule Take 1 capsule by mouth daily.    . ergocalciferol (VITAMIN D2) 50000 UNITS capsule Take 50,000 Units by mouth once a week.    . ferrous sulfate 324 (65 FE) MG TBEC Take 1 tablet by mouth every other day.    . gabapentin (NEURONTIN) 100 MG capsule Take 1 capsule (100 mg total) by mouth 3 (three) times daily. 90 capsule 3  . Guggulipid (GUGULIPID PO) Take by mouth daily.    Marland Kitchen ketoconazole (NIZORAL) 2 % cream Apply 1 application  topically 2 (two) times daily.     Marland Kitchen KETOPROFEN-KETAMINE-LIDOCAINE EX Apply 1 application topically 2 (two) times daily.    . L-GLUTAMINE PO Take by mouth.      . latanoprost (XALATAN) 0.005 % ophthalmic solution daily.     Marland Kitchen losartan (COZAAR) 100 MG tablet TAKE 1 TABLET BY MOUTH EVERY DAY 90 tablet 1  . LUMIGAN 0.01 % SOLN 1 drop at bedtime.     . metoprolol succinate (TOPROL-XL) 25 MG 24 hr tablet Take 12.5 mg by mouth daily.    . metoprolol tartrate (LOPRESSOR) 25 MG tablet TAKE 1 TABLET BY MOUTH TWICE DAILY 60 tablet 5  . Multiple Vitamin (MULTIVITAMIN) tablet Take 1 tablet by mouth daily.      . NON FORMULARY Memory Essentials 2 tablets BID.    Marland Kitchen NON FORMULARY Pro Liver Milk Thistle and Acetyl-Cysteine 1 tablet daily.    . Nutritional Supplements (MELATONIN PO) Take by mouth.    . Omega-3 Fatty Acids (OMEGA 3 PO) Take by mouth.      Marland Kitchen omeprazole (PRILOSEC) 40 MG capsule daily.     Loma Boston Calcium 500 MG TABS Take two by mouth daily     . Red Yeast Rice Extract (RED YEAST RICE PO) Take by mouth.      . RUTIN PO Take by mouth.    Marland Kitchen  SYRINGE-NEEDLE, DISP, 3 ML (SAFETY-LOK 3CC SYR 22GX1.5") 22G X 1-1/2" 3 ML MISC 2 ml's monthly-given 1 shot monthly     . triamcinolone cream (KENALOG) 0.1 % daily.     . TURMERIC PO Take by mouth.      . warfarin (COUMADIN) 2 MG tablet 4 mg all days but Wednesday,  Wednesday take 3 mg 60 tablet 2  . amLODipine (NORVASC) 10 MG tablet TAKE 1 TABLET BY MOUTH DAILY 30 tablet 6  . HYDROcodone-acetaminophen (NORCO/VICODIN) 5-325 MG per tablet Take 1 tablet by mouth at bedtime as needed for moderate pain. 30 tablet 0  . Tdap (BOOSTRIX) 5-2.5-18.5 LF-MCG/0.5 injection Inject 0.5 mLs into the muscle once. (Patient not taking: Reported on 07/21/2015) 0.5 mL 0   No facility-administered medications prior to visit.    Review of Systems;  Patient denies headache, fevers, malaise, unintentional weight loss, skin rash, eye pain, sinus congestion and sinus pain,  sore throat, dysphagia,  hemoptysis , cough, dyspnea, wheezing, chest pain, palpitations, orthopnea, edema, abdominal pain, nausea, melena, diarrhea, constipation, flank pain, dysuria, hematuria, urinary  Frequency, nocturia, numbness, tingling, seizures,  Focal weakness, Loss of consciousness,  Tremor, insomnia, depression, anxiety, and suicidal ideation.      Objective:  BP 102/50 mmHg  Pulse 68  Temp(Src) 97.8 F (36.6 C) (Oral)  Resp 12  Ht 5\' 7"  (1.702 m)  Wt 147 lb 12 oz (67.019 kg)  BMI 23.14 kg/m2  SpO2 97%  BP Readings from Last 3 Encounters:  07/21/15 102/50  02/21/15 112/54  01/20/15 120/50    Wt Readings from Last 3 Encounters:  07/21/15 147 lb 12 oz (67.019 kg)  02/21/15 148 lb 8 oz (67.359 kg)  01/20/15 147 lb 8 oz (66.906 kg)    General appearance: alert, cooperative and appears stated age Ears: normal TM's and external ear canals both ears Throat: lips, mucosa, and tongue normal; teeth and gums normal Neck: no adenopathy, no carotid bruit, supple, symmetrical, trachea midline and thyroid not enlarged, symmetric, no tenderness/mass/nodules Back: symmetric, no curvature. ROM normal. No CVA tenderness. Lungs: clear to auscultation bilaterally Heart: regular rate and rhythm, S1, S2 normal, no murmur, click, rub or gallop Abdomen: soft, non-tender; bowel sounds normal; no masses,  no organomegaly Pulses: 2+ and symmetric Skin: Skin color, texture, turgor normal. No rashes or lesions Lymph nodes: Cervical, supraclavicular, and axillary nodes normal.  No results found for: HGBA1C  Lab Results  Component Value Date   CREATININE 0.89 12/20/2014   CREATININE 0.9 10/19/2014   CREATININE 11.0* 03/02/2014   CREATININE 1.0 03/02/2014    Lab Results  Component Value Date   WBC 4.4 12/20/2014   HGB 12.8* 12/20/2014   HCT 38.1* 12/20/2014   PLT 203 12/20/2014   GLUCOSE 76 12/20/2014   CHOL 174 03/02/2014   TRIG 57 03/02/2014   HDL 59 03/02/2014   LDLDIRECT  105.9 03/13/2012   LDLCALC 104 03/02/2014   ALT 21 10/19/2014   AST 34 10/19/2014   NA 140 12/20/2014   K 4.5 12/20/2014   CL 104 12/20/2014   CREATININE 0.89 12/20/2014   BUN 10 12/20/2014   CO2 30 12/20/2014   TSH 1.68 10/19/2014   INR 3.3* 07/06/2015    No results found.  Assessment & Plan:   Problem List Items Addressed This Visit      Unprioritized   Sciatica of right side    His pain is severe and interrupting sleep.  Adding tylenol, prn vicodin .  The risks and  benefits of narcotics  were discussed with patient today including excessive sedation leading to respiratory depression,  impaired thinking/driving, and addiction.  Patient was advised to avoid concurrent use with alcohol, to use medication only as needed and not to share with others  .        Episodic lightheadedness    Addressing hypotension with reduction in amldopine dose.       Benign essential HTN - Primary    BP is too low and may be contributing to orthostatic hypotension.reducing amlodipine to 5 mg daily       Relevant Medications   amLODipine (NORVASC) 5 MG tablet      I have discontinued Mr. Spindel's Tdap. I have also changed his amLODipine. Additionally, I am having him maintain his SYRINGE-NEEDLE (DISP) 3 ML, Oyster Shell Calcium, L-GLUTAMINE PO, Omega-3 Fatty Acids (OMEGA 3 PO), Red Yeast Rice Extract (RED YEAST RICE PO), TURMERIC PO, multivitamin, brimonidine, latanoprost, Nutritional Supplements (MELATONIN PO), ergocalciferol, DHA-EPA-Vit B6-B12-Folic Acid, LUMIGAN, omeprazole, triamcinolone cream, NON FORMULARY, NON FORMULARY, 5-Hydroxytryptophan (5-HTP PO), Guggulipid (GUGULIPID PO), Calcium-Magnesium-Vitamin D (CALCIUM MAGNESIUM PO), ALPHA LIPOIC ACID PO, vitamin C, RUTIN PO, ketoconazole, ferrous sulfate, DULoxetine, KETOPROFEN-KETAMINE-LIDOCAINE EX, gabapentin, metoprolol succinate, losartan, metoprolol tartrate, co-enzyme Q-10, warfarin, Coenzyme Q10-Levocarnitine (CO Q-10 PLUS PO), meclizine,  and HYDROcodone-acetaminophen.  Meds ordered this encounter  Medications  . Coenzyme Q10-Levocarnitine (CO Q-10 PLUS PO)    Sig: Take 1 capsule by mouth daily.  . meclizine (ANTIVERT) 12.5 MG tablet    Sig: Take by mouth.  Marland Kitchen amLODipine (NORVASC) 5 MG tablet    Sig: Take 1 tablet (5 mg total) by mouth daily.    Dispense:  30 tablet    Refill:  11  . HYDROcodone-acetaminophen (NORCO/VICODIN) 5-325 MG per tablet    Sig: Take 1 tablet by mouth at bedtime as needed for moderate pain.    Dispense:  30 tablet    Refill:  0    Medications Discontinued During This Encounter  Medication Reason  . Tdap (BOOSTRIX) 5-2.5-18.5 LF-MCG/0.5 injection Error  . amLODipine (NORVASC) 10 MG tablet Reorder  . Tdap (BOOSTRIX) 5-2.5-18.5 LF-MCG/0.5 injection Error  . HYDROcodone-acetaminophen (NORCO/VICODIN) 5-325 MG per tablet Reorder    Follow-up: No Follow-up on file.   Crecencio Mc, MD

## 2015-07-21 NOTE — Progress Notes (Signed)
Pre-visit discussion using our clinic review tool. No additional management support is needed unless otherwise documented below in the visit note.  

## 2015-07-21 NOTE — Patient Instructions (Addendum)
Your blood pressure is too low and may be aggravating your dizziness  I am going to reduce the amlodipine to 5 mg daily   You can try using 500 mg tylenol at bedtoime for your leg pain  If it does not help, you can take one hydrocodone at bedtime.  Thi sis a controlled substance and should not be shared with others or combined with alcohol.   Please have fasting labs done at [facility] [place] at the end of August

## 2015-07-23 ENCOUNTER — Other Ambulatory Visit: Payer: Self-pay | Admitting: Internal Medicine

## 2015-07-23 NOTE — Assessment & Plan Note (Signed)
Addressing hypotension with reduction in amldopine dose.

## 2015-07-23 NOTE — Assessment & Plan Note (Signed)
BP is too low and may be contributing to orthostatic hypotension.reducing amlodipine to 5 mg daily

## 2015-07-23 NOTE — Assessment & Plan Note (Signed)
His pain is severe and interrupting sleep.  Adding tylenol, prn vicodin .  The risks and benefits of narcotics  were discussed with patient today including excessive sedation leading to respiratory depression,  impaired thinking/driving, and addiction.  Patient was advised to avoid concurrent use with alcohol, to use medication only as needed and not to share with others  .

## 2015-08-03 LAB — LIPID PANEL
Cholesterol: 207 mg/dL — AB (ref 0–200)
HDL: 61 mg/dL (ref 35–70)
LDL CALC: 132 mg/dL
Triglycerides: 71 mg/dL (ref 40–160)

## 2015-08-03 LAB — HEPATIC FUNCTION PANEL
ALK PHOS: 74 U/L (ref 25–125)
ALT: 22 U/L (ref 10–40)
AST: 39 U/L (ref 14–40)
Bilirubin, Total: 0.6 mg/dL

## 2015-08-03 LAB — BASIC METABOLIC PANEL
BUN: 12 mg/dL (ref 4–21)
CREATININE: 0.9 mg/dL (ref 0.6–1.3)
Glucose: 102 mg/dL
POTASSIUM: 4.8 mmol/L (ref 3.4–5.3)
SODIUM: 140 mmol/L (ref 137–147)

## 2015-08-03 LAB — POCT INR: INR: 3.3 — AB (ref 0.9–1.1)

## 2015-08-03 LAB — PROTIME-INR: Protime: 33.7 seconds — AB (ref 10.0–13.8)

## 2015-08-08 ENCOUNTER — Encounter: Payer: Self-pay | Admitting: Surgical

## 2015-08-09 ENCOUNTER — Telehealth: Payer: Self-pay | Admitting: Internal Medicine

## 2015-08-09 NOTE — Telephone Encounter (Signed)
Your cholesterol, liver and kidney function are normal.  You do not need any medication changes. Please plan to repeat the labs in 6 months.    Your INR/coumadin level is therapeutic,  Continue current regimen and repeat PT/INR in one month.

## 2015-08-10 ENCOUNTER — Encounter: Payer: Self-pay | Admitting: *Deleted

## 2015-08-10 NOTE — Telephone Encounter (Signed)
Letter mailed to patient unable to reach by phone.

## 2015-08-17 ENCOUNTER — Ambulatory Visit (INDEPENDENT_AMBULATORY_CARE_PROVIDER_SITE_OTHER): Payer: Medicare Other | Admitting: Cardiovascular Disease

## 2015-08-17 ENCOUNTER — Encounter: Payer: Self-pay | Admitting: Cardiovascular Disease

## 2015-08-17 VITALS — BP 140/62 | HR 83 | Ht 67.0 in | Wt 149.5 lb

## 2015-08-17 DIAGNOSIS — I4891 Unspecified atrial fibrillation: Secondary | ICD-10-CM | POA: Diagnosis not present

## 2015-08-17 DIAGNOSIS — R42 Dizziness and giddiness: Secondary | ICD-10-CM

## 2015-08-17 DIAGNOSIS — E7800 Pure hypercholesterolemia, unspecified: Secondary | ICD-10-CM

## 2015-08-17 DIAGNOSIS — I10 Essential (primary) hypertension: Secondary | ICD-10-CM | POA: Diagnosis not present

## 2015-08-17 DIAGNOSIS — R001 Bradycardia, unspecified: Secondary | ICD-10-CM

## 2015-08-17 DIAGNOSIS — I483 Typical atrial flutter: Secondary | ICD-10-CM

## 2015-08-17 NOTE — Assessment & Plan Note (Signed)
No known coronary artery disease. Currently not on a statin

## 2015-08-17 NOTE — Assessment & Plan Note (Signed)
Normal sinus rhythm today, no clinical indication of arrhythmia

## 2015-08-17 NOTE — Assessment & Plan Note (Signed)
Blood pressure is well controlled on today's visit. No changes made to the medications. 

## 2015-08-17 NOTE — Progress Notes (Signed)
Patient ID: Kevin [name], male    DOB: [birth year], 81 y.o.   MRN: [id number]  HPI Comments: [name] is a very pleasant 81 year old gentleman, patient of Dr. Derrel Nip, also seen by Dr. Clayborn Bigness, with a history of mitral valve replacement with St. Jude valve in 1994, history of atrial fibrillation, mild restriction on pulmonary function tests who presented in 2014 to our office with worsening shortness of breath, found to be in atrial flutter.  cardioversion was performed and he converted to normal sinus rhythm with one shock. He presents today for routine follow-up of his atrial flutter.  Notes indicate a Holter monitor confirming bradycardia. Pacemaker was placed by Dr.  Josefa Half at [facility].  In follow-up today, he reports having vertigo, takes meclizine when necessary. No chest pain symptoms, no leg edema. Was having lightheadedness, amlodipine decreased down to 5 mg daily. Blood pressure seems to have improved. Was 956 systolic on visit with Dr. Derrel Nip. He continues to have vertigo. Also complains about painful walking, sciatica.   severe right thigh pain, walks with a walker and a cane . Otherwise no other complaints  EKG on today's visit shows paced rhythm with rate 83 bpm  EKG from [facility] dated 12/20/2014 showing sinus bradycardia rate 49 bpm  Atrial flutter with slow ventricular response rate of 50 seen 06/13/2012 Atrial flutter with slow ventricular response rate 47 seen June 03 2012 Details of the report he had a Medtronic dual-chamber pacemaker [id number] model   other past medical history  echocardiogram may 2014 showing ejection fraction 35-40%, left atrial enlargement. Previous echocardiogram showed ejection fraction 50% He reports that he stopped his Lipitor as it made him feel nervous, he also had sweating.  Previous  CT scan did not show any PE Chest x-ray showed prominent pulmonary arteries otherwise no prominent new findings He had a transesophageal echo 04/30/2012 that was  essentially normal documenting normal functioning mitral valve   Allergies  Allergen Reactions  . Aspirin     Heart operation was told to never take Aspirin    Outpatient Encounter Prescriptions as of 08/17/2015  Medication Sig  . 5-Hydroxytryptophan (5-HTP PO) Take by mouth daily.  . ALPHA LIPOIC ACID PO Take by mouth daily.  Marland Kitchen amLODipine (NORVASC) 5 MG tablet Take 1 tablet (5 mg total) by mouth daily.  . Ascorbic Acid (VITAMIN C) 100 MG tablet Take 100 mg by mouth daily.  . brimonidine (ALPHAGAN) 0.15 % ophthalmic solution 2 (two) times daily.   . Calcium-Magnesium-Vitamin D (CALCIUM MAGNESIUM PO) Take by mouth 2 (two) times daily.  Marland Kitchen co-enzyme Q-10 30 MG capsule Take 1 capsule (30 mg total) by mouth daily.  . Coenzyme Q10-Levocarnitine (CO Q-10 PLUS PO) Take 1 capsule by mouth daily.  . DHA-EPA-Vit B6-B12-Folic Acid (CARDIOVID PLUS) CAPS Take 1 capsule by mouth 2 (two) times daily.   . DULoxetine (CYMBALTA) 20 MG capsule Take 1 capsule by mouth daily.  . ergocalciferol (VITAMIN D2) 50000 UNITS capsule Take 50,000 Units by mouth once a week.  . ferrous sulfate 324 (65 FE) MG TBEC Take 1 tablet by mouth every other day.  . gabapentin (NEURONTIN) 100 MG capsule Take 1 capsule (100 mg total) by mouth 3 (three) times daily.  . Guggulipid (GUGULIPID PO) Take by mouth daily.  Marland Kitchen HYDROcodone-acetaminophen (NORCO/VICODIN) 5-325 MG per tablet Take 1 tablet by mouth at bedtime as needed for moderate pain.  Marland Kitchen ketoconazole (NIZORAL) 2 % cream Apply 1 application topically 2 (two) times daily.   Marland Kitchen KETOPROFEN-KETAMINE-LIDOCAINE  EX Apply 1 application topically 2 (two) times daily.  . L-GLUTAMINE PO Take by mouth.    . latanoprost (XALATAN) 0.005 % ophthalmic solution daily.   Marland Kitchen losartan (COZAAR) 100 MG tablet TAKE 1 TABLET BY MOUTH EVERY DAY  . LUMIGAN 0.01 % SOLN 1 drop at bedtime.   . meclizine (ANTIVERT) 12.5 MG tablet Take by mouth.  . metoprolol succinate (TOPROL-XL) 25 MG 24 hr tablet Take  12.5 mg by mouth daily.  . metoprolol tartrate (LOPRESSOR) 25 MG tablet TAKE 1 TABLET BY MOUTH TWICE DAILY  . Multiple Vitamin (MULTIVITAMIN) tablet Take 1 tablet by mouth daily.    . NON FORMULARY Memory Essentials 2 tablets BID.  Marland Kitchen NON FORMULARY Pro Liver Milk Thistle and Acetyl-Cysteine 1 tablet daily.  . Nutritional Supplements (MELATONIN PO) Take by mouth.  . Omega-3 Fatty Acids (OMEGA 3 PO) Take by mouth.    Marland Kitchen omeprazole (PRILOSEC) 40 MG capsule daily.   Loma Boston Calcium 500 MG TABS Take two by mouth daily   . Red Yeast Rice Extract (RED YEAST RICE PO) Take by mouth.    . RUTIN PO Take by mouth.  . SYRINGE-NEEDLE, DISP, 3 ML (SAFETY-LOK 3CC SYR 22GX1.5") 22G X 1-1/2" 3 ML MISC 2 ml's monthly-given 1 shot monthly   . triamcinolone cream (KENALOG) 0.1 % daily.   . TURMERIC PO Take by mouth.    . warfarin (COUMADIN) 2 MG tablet 4 mg all days but Wednesday,  Wednesday take 3 mg   No facility-administered encounter medications on file as of 08/17/2015.    Past Medical History  Diagnosis Date  . Atrial fibrillation   . Sciatica   . Chronic headaches started age 79  . History of mitral valve replacement with mechanical valve   . Sciatica   . Osteoporosis     secondary to low testoerone  . Cancer     melanoma- right ear  . GERD (gastroesophageal reflux disease)   . Glaucoma   . Hypertension     Past Surgical History  Procedure Laterality Date  . Nasal septum surgery  1963  . Cataract extraction, bilateral      x 2  . Melanoma excision      right ear  . Mitral valve replacement  1994  . Hernia repair      x 2  . Cardioversion  2013  . Permanent pacemaker insertion Left 12/22/2014    Social History  reports that he has never smoked. He has never used smokeless tobacco. He reports that he does not drink alcohol or use illicit drugs.  Family History family history includes Alzheimer's disease in his brother; Arrhythmia in his brother; Cancer in his father; Mental  illness in his mother.   Review of Systems  Constitutional: Negative.   Respiratory: Negative.   Cardiovascular: Negative.   Gastrointestinal: Negative.   Musculoskeletal: Positive for myalgias.       Right thigh pain  Skin: Negative.   Neurological: Positive for light-headedness.  Hematological: Negative.   Psychiatric/Behavioral: Negative.   All other systems reviewed and are negative.   BP 140/62 mmHg  Pulse 83  Ht 5\' 7"  (1.702 m)  Wt 149 lb 8 oz (67.813 kg)  BMI 23.41 kg/m2  Physical Exam  Constitutional: He is oriented to person, place, and time. He appears well-developed and well-nourished.  HENT:  Head: Normocephalic.  Nose: Nose normal.  Mouth/Throat: Oropharynx is clear and moist.  Eyes: Conjunctivae are normal. Pupils are equal, round, and reactive  to light.  Neck: Normal range of motion. Neck supple. No JVD present.  Cardiovascular: Regular rhythm, S1 normal, S2 normal, normal heart sounds and intact distal pulses.  Bradycardia present.  Exam reveals no gallop and no friction rub.   No murmur heard. Pulmonary/Chest: Effort normal and breath sounds normal. No respiratory distress. He has no wheezes. He has no rales. He exhibits no tenderness.  Abdominal: Soft. Bowel sounds are normal. He exhibits no distension. There is no tenderness.  Musculoskeletal: Normal range of motion. He exhibits no edema or tenderness.  Lymphadenopathy:    He has no cervical adenopathy.  Neurological: He is alert and oriented to person, place, and time. Coordination normal.  Skin: Skin is warm and dry. No rash noted. No erythema.  Psychiatric: He has a normal mood and affect. His behavior is normal. Judgment and thought content normal.      Assessment and Plan   Nursing note and vitals reviewed.

## 2015-08-17 NOTE — Assessment & Plan Note (Signed)
Poor historian, unable to determine if he continues to have lightheadedness with improved blood pressure. Symptoms he describes sounds more like vertigo for which he takes meclizine

## 2015-08-17 NOTE — Assessment & Plan Note (Signed)
Previously noted to have bradycardia, pacer placed at [facility]. Pacemaker followed by Dr. Clayborn Bigness

## 2015-08-17 NOTE — Patient Instructions (Signed)
You are doing well. No medication changes were made.  Please call us if you have new issues that need to be addressed before your next appt.  Your physician wants you to follow-up in: 12 months.  You will receive a reminder letter in the mail two months in advance. If you don't receive a letter, please call our office to schedule the follow-up appointment. 

## 2015-08-17 NOTE — Assessment & Plan Note (Signed)
Blood pressure is well controlled on today's visit. No changes made to the medications. Encouraged him to continue amlodipine 5 mg daily

## 2015-08-18 ENCOUNTER — Encounter: Payer: Self-pay | Admitting: Internal Medicine

## 2015-08-19 ENCOUNTER — Other Ambulatory Visit: Payer: Self-pay | Admitting: Cardiovascular Disease

## 2015-08-19 ENCOUNTER — Telehealth: Payer: Self-pay

## 2015-08-19 NOTE — Telephone Encounter (Signed)
Pt states he has an appt with Dentist on Thursday, and needs to take amoxicillin before his visit. Is this ok. Please call and advise. Ok to leave msg

## 2015-08-19 NOTE — Telephone Encounter (Signed)
Typically takes 2000 mg  One time 2 hours before the dental procedure

## 2015-08-19 NOTE — Telephone Encounter (Signed)
Pt is s/p mitral valve replacement in 1994.   Attempted to contact pt to find out what type of procedure he is having. No answer, unable to leave message.

## 2015-08-22 NOTE — Telephone Encounter (Signed)
Spoke w/ pt.  Advised him of Dr. Donivan Scull recommendation.  He reports that he already has some pills at home and does not need another rx sent in.  Reports that his dentist prescribed him amoxicillin in Feb, but he has not used it and it has not expired.

## 2015-08-31 LAB — POCT INR: INR: 3.1 — AB (ref 0.9–1.1)

## 2015-09-01 ENCOUNTER — Telehealth: Payer: Self-pay | Admitting: Internal Medicine

## 2015-09-01 NOTE — Telephone Encounter (Signed)
Coumadin level is therapeutic,  Continue current regimen and repeat PT/INR in one mionth

## 2015-09-01 NOTE — Telephone Encounter (Signed)
Patient  INR = 3.1 current regimen is 4 mg all days except Sunday patient takes 3 mg.

## 2015-09-01 NOTE — Telephone Encounter (Signed)
Patient notified and voiced understanding.

## 2015-09-22 IMAGING — CR DG CHEST 2V
1 series · 2 of 2 positions shown · non-contrast
Comparison: June 25, 2011.

CLINICAL DATA: Hypertension.  Atrial fibrillation.

EXAM:
CHEST  2 VIEW

[Series 1: dxr chest pa (or ap) and lateral · 0.14mm/px · 2 of 2 slices shown]
[im 1/2]
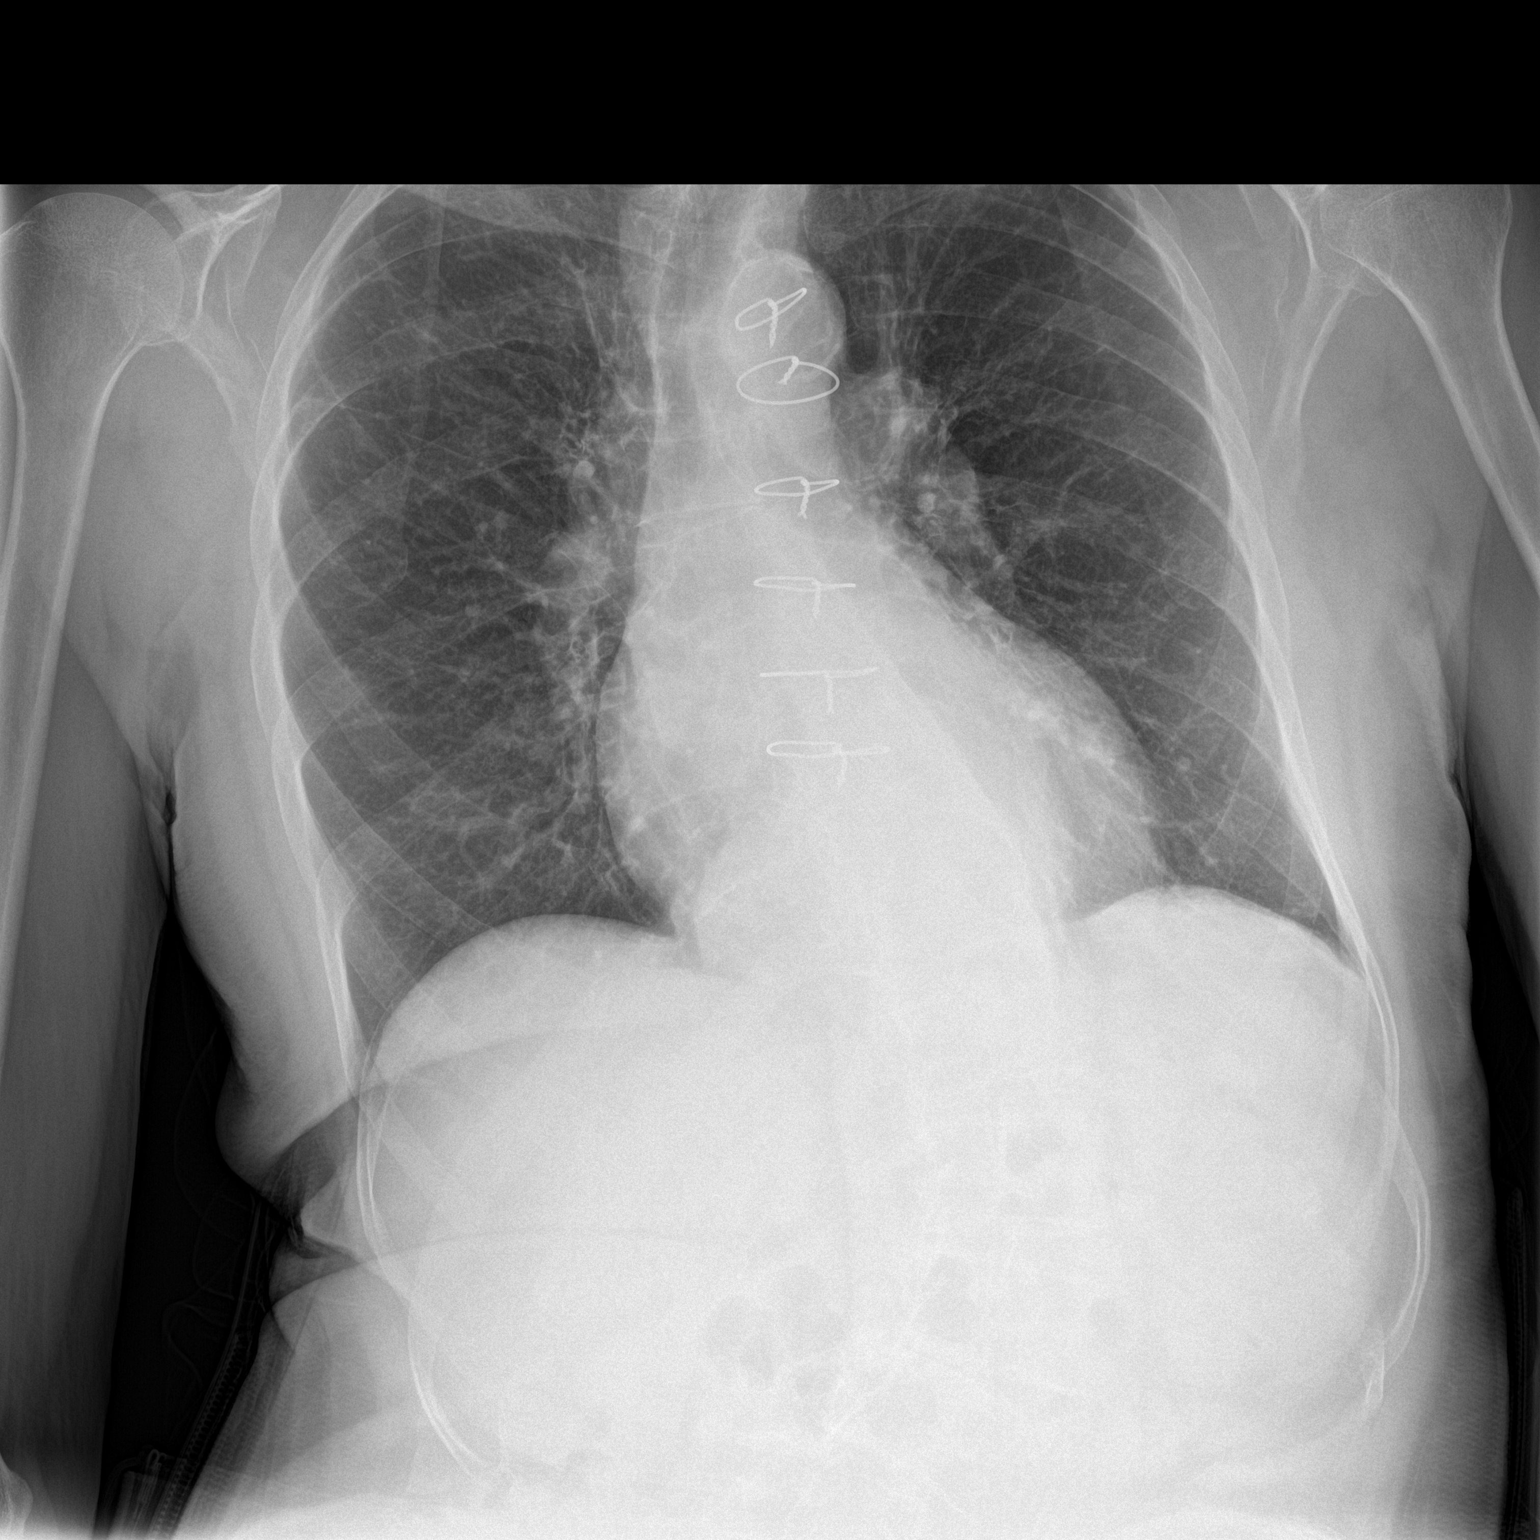
[im 2/2]
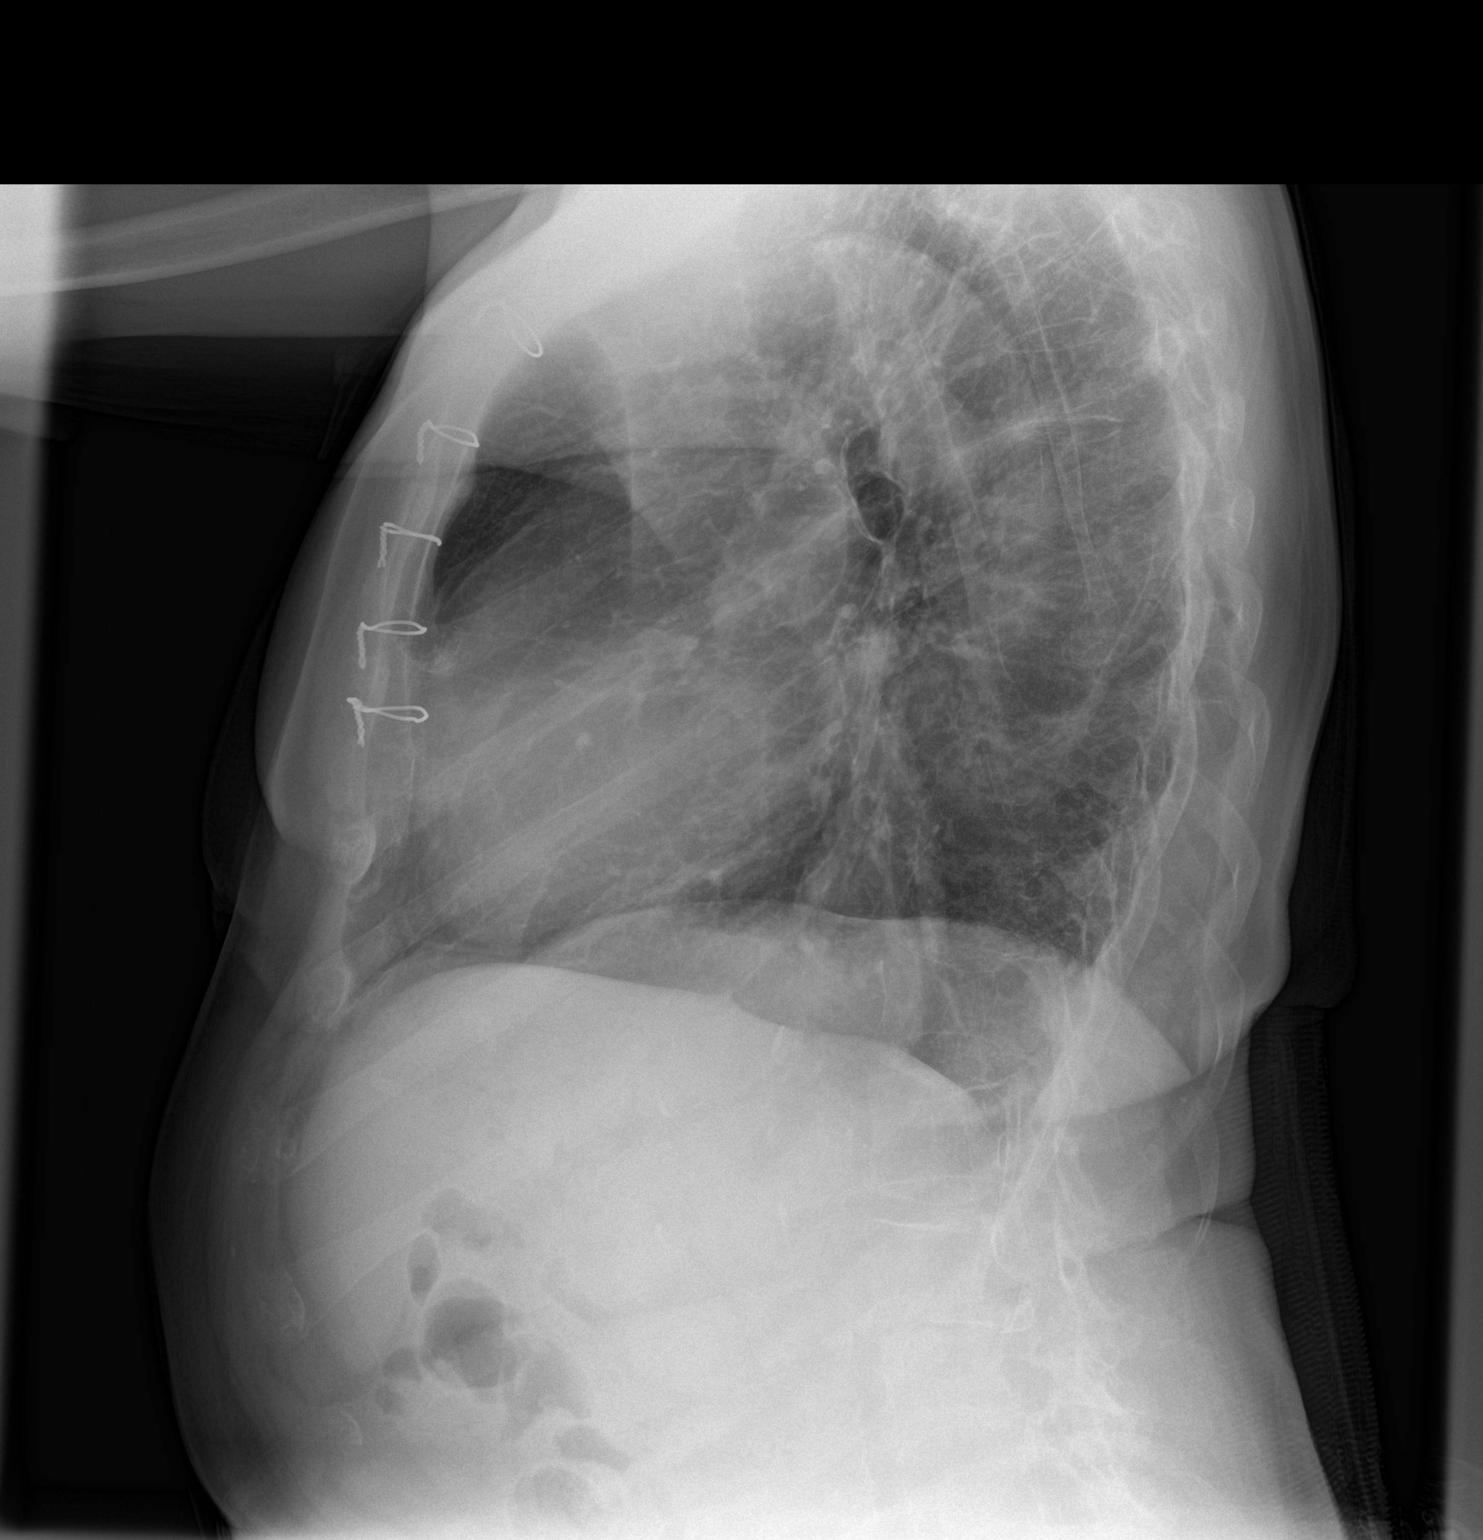

[2 of 2 positions shown; findings below may reference images not displayed]

FINDINGS: Stable cardiomediastinal silhouette. Sternotomy wires are noted. No
pneumothorax or pleural effusion is noted. Stable mild biapical
scarring is noted. No acute pulmonary disease is noted.
Dextroscoliosis of upper thoracic spine is noted.
IMPRESSION: No acute cardiopulmonary abnormality seen.

## 2015-09-24 IMAGING — CR DG CHEST 1V PORT
1 series · 1 of 1 positions shown · non-contrast
Comparison: 12/20/2014

CLINICAL DATA: Postop left subclavian pacer insertion

EXAM:
PORTABLE CHEST - 1 VIEW

[ap]
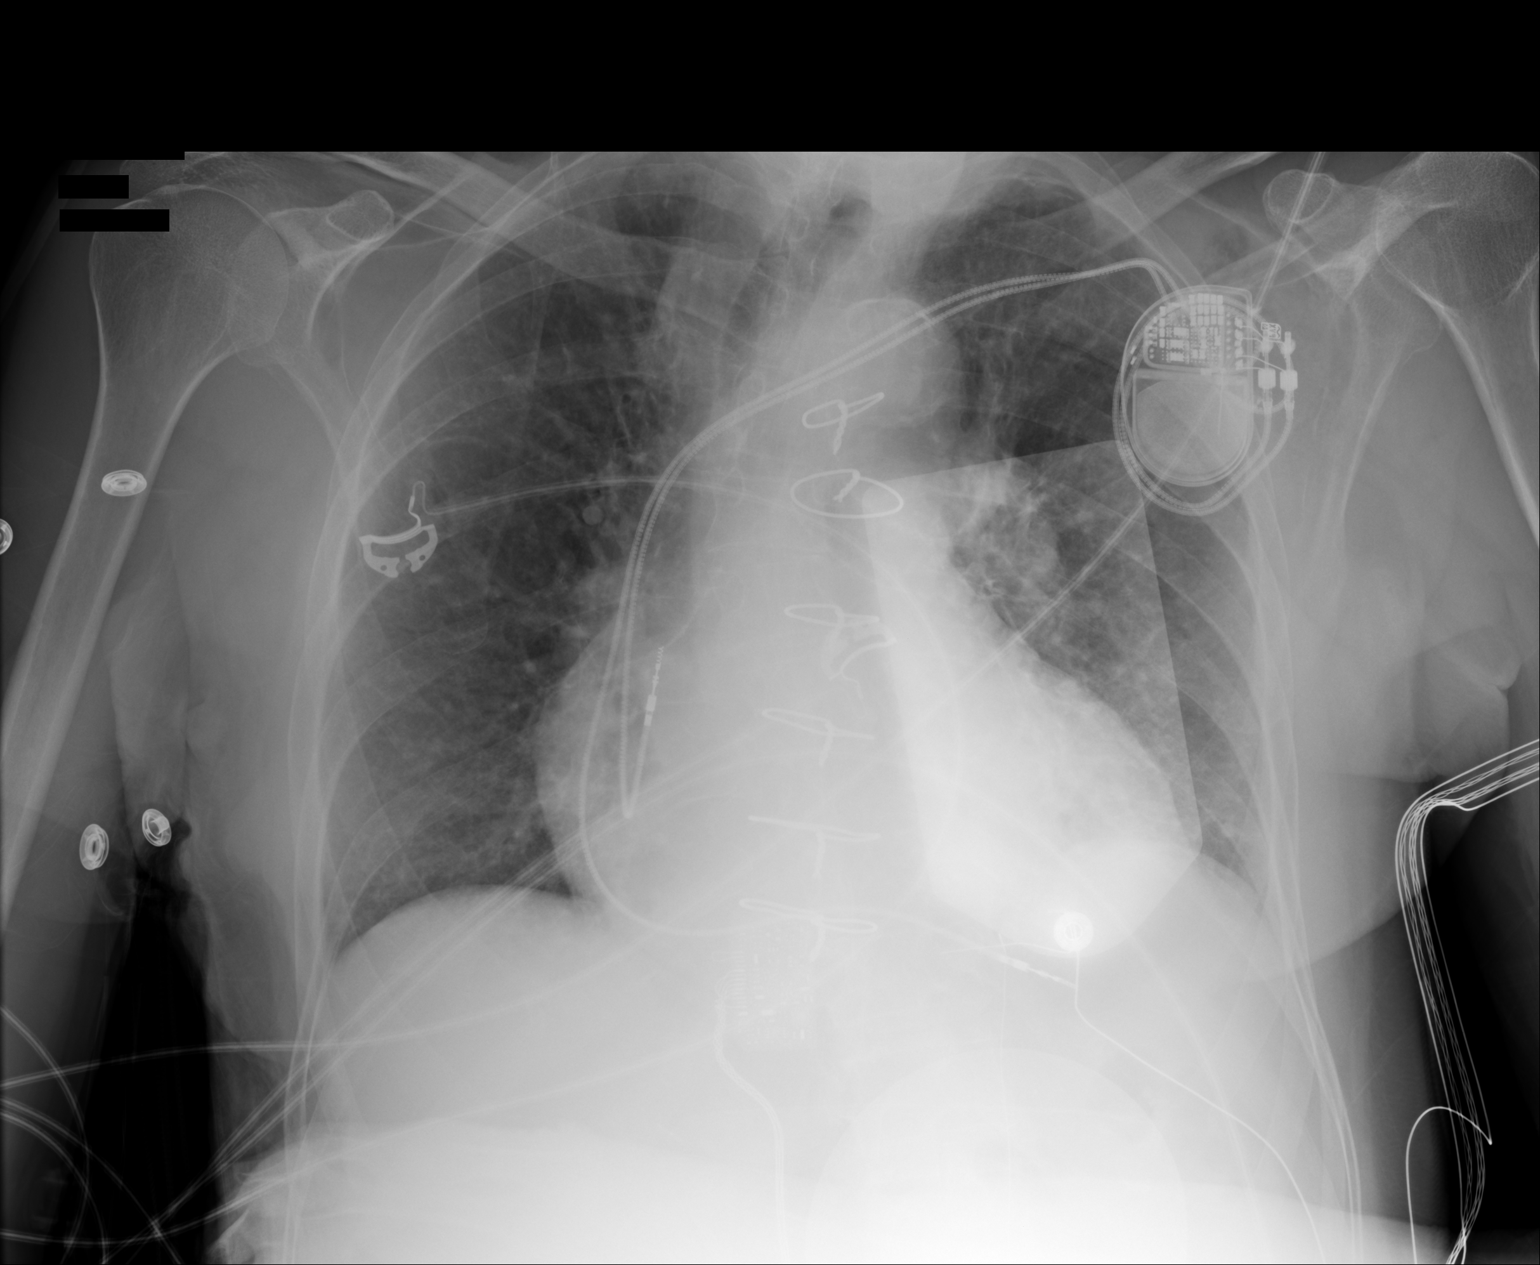

[1 of 1 positions shown; findings below may reference images not displayed]

FINDINGS: New left subclavian 2 lead pacer noted. Prior median sternotomy.
Defibrillator pads overlie the left chest and left upper quadrant.
Stable cardiomegaly without edema, collapse or consolidation. No
effusion or pneumothorax. Trachea midline. Atherosclerosis noted of
the aorta. Scoliosis of the spine evident.
IMPRESSION: Left subclavian 2 lead pacer.

Stable cardiomegaly without acute process.

No pneumothorax.

## 2015-09-26 ENCOUNTER — Other Ambulatory Visit: Payer: Self-pay | Admitting: Internal Medicine

## 2015-09-27 LAB — POCT INR: INR: 4 — AB (ref <=1.1)

## 2015-09-29 ENCOUNTER — Telehealth: Payer: Self-pay | Admitting: Internal Medicine

## 2015-09-29 NOTE — Telephone Encounter (Signed)
INR = 4.0 current regimen is 4 mg all days except Sunday patient takes 3 mg.

## 2015-09-29 NOTE — Telephone Encounter (Signed)
Tried to call patient no answer. 

## 2015-09-29 NOTE — Telephone Encounter (Signed)
Your INR is too high.  Please suspend your coumadin for one day and resume at 3 mg daily all days but Friday and Sunday .  Take 4 mg on those days.  Repeat in one week

## 2015-09-29 NOTE — Telephone Encounter (Signed)
Spoke with patient and gave lab results. Patient wrote down the new instructions for the coumadin regimen. He is going to come back in next week to have INR rechecked. Patient verbalized understanding.

## 2015-10-05 LAB — PROTIME-INR: INR: 2.4 — AB (ref 0.9–1.1)

## 2015-10-10 ENCOUNTER — Telehealth: Payer: Self-pay | Admitting: Internal Medicine

## 2015-10-10 ENCOUNTER — Other Ambulatory Visit: Payer: Self-pay | Admitting: Internal Medicine

## 2015-10-10 MED ORDER — WARFARIN SODIUM 3 MG PO TABS: ORAL_TABLET | ORAL | Status: DC

## 2015-10-10 NOTE — Telephone Encounter (Signed)
Please call pt to advise him of how Warfarin rx should be taken/msn

## 2015-10-10 NOTE — Telephone Encounter (Signed)
Pt called to request refill on Warfarin 3mg  rx. Needs to be sent to [facility] on [address]

## 2015-10-10 NOTE — Telephone Encounter (Signed)
Completed.

## 2015-10-10 NOTE — Telephone Encounter (Signed)
Spoke with patent and gave him the instructions again on how to take the Warfarin. When he picked up the medications the instructions was different on the bottle so that confused him. Patient repeated instructions back to and verbalized understanding.

## 2015-10-22 ENCOUNTER — Other Ambulatory Visit: Payer: Self-pay | Admitting: Internal Medicine

## 2015-10-28 ENCOUNTER — Other Ambulatory Visit: Payer: Self-pay | Admitting: Internal Medicine

## 2015-11-01 LAB — PROTIME-INR

## 2015-11-07 ENCOUNTER — Telehealth: Payer: Self-pay | Admitting: Internal Medicine

## 2015-11-07 DIAGNOSIS — Z7901 Long term (current) use of anticoagulants: Secondary | ICD-10-CM

## 2015-11-07 NOTE — Telephone Encounter (Signed)
INR = 2.4( 3 mg daily all days but Friday and Sunday 4 mg those days coumadin.

## 2015-11-07 NOTE — Assessment & Plan Note (Signed)
4 mg daily x 4,  3 mg daily x 3 for inr 2.4 (regimen is increased by 2 mg / week)

## 2015-11-07 NOTE — Telephone Encounter (Signed)
Patient notified and voiced understanding with read back 

## 2015-11-07 NOTE — Telephone Encounter (Signed)
Coumadin level is slightly under therapeutic,  Change current regimen to 4 mg on Mondays Wednesday Fridays and Sundays,  3 mg all other days  and repeat PT/INR in one week

## 2015-12-09 ENCOUNTER — Other Ambulatory Visit: Payer: Self-pay | Admitting: Cardiovascular Disease

## 2015-12-09 ENCOUNTER — Telehealth: Payer: Self-pay

## 2015-12-09 NOTE — Telephone Encounter (Signed)
LAB RESULTS FROM [facility].  INR 2.7 PT 27.9.    Please advise.

## 2015-12-09 NOTE — Telephone Encounter (Signed)
Coumadin level is therapeutic,  Continue current regimen and repeat PT/INR in one month 

## 2015-12-09 NOTE — Telephone Encounter (Signed)
Spoke with the patient and verbalized and reviewed lab results.

## 2016-01-03 LAB — PROTIME-INR: INR: 3.1 — AB (ref 0.9–1.1)

## 2016-01-04 ENCOUNTER — Telehealth: Payer: Self-pay | Admitting: Internal Medicine

## 2016-01-04 NOTE — Telephone Encounter (Signed)
Patient INR= 3.1 current regimen is 4 mg on Mondays Wednesday Fridays and Sundays, 3 mg all other days

## 2016-01-04 NOTE — Telephone Encounter (Signed)
Patient notified  And voiced understanding. 

## 2016-01-04 NOTE — Telephone Encounter (Signed)
  Your INR/coumadin level is therapeutic,  Continue current regimen and repeat PT/INR in one month.   

## 2016-01-18 ENCOUNTER — Telehealth: Payer: Self-pay | Admitting: *Deleted

## 2016-01-18 LAB — POCT INR: INR: 3.5 — AB (ref 0.9–1.1)

## 2016-01-18 NOTE — Telephone Encounter (Signed)
Please advise, thanks.

## 2016-01-18 NOTE — Telephone Encounter (Signed)
Printed order for PT-INR had MD sign and faxed back to facility.

## 2016-01-18 NOTE — Telephone Encounter (Signed)
Lori from the [facility] has requested patient lab orders, she will draw the labs today.  Please call her with verbal lab orders  [phone]

## 2016-01-18 NOTE — Telephone Encounter (Signed)
Cecille Rubin called back and stated if the order can be faxed over to [phone]. Thank you!

## 2016-01-19 ENCOUNTER — Other Ambulatory Visit: Payer: Self-pay | Admitting: Internal Medicine

## 2016-01-19 ENCOUNTER — Telehealth: Payer: Self-pay | Admitting: Internal Medicine

## 2016-01-19 LAB — HEPATIC FUNCTION PANEL
ALK PHOS: 67 U/L (ref 25–125)
ALT: 39 U/L (ref 10–40)
AST: 25 U/L (ref 14–40)
Bilirubin, Total: 0.5 mg/dL

## 2016-01-19 LAB — LIPID PANEL
Cholesterol: 196 mg/dL (ref 0–200)
HDL: 59 mg/dL (ref 35–70)
LDL Cholesterol: 124 mg/dL
Triglycerides: 67 mg/dL (ref 40–160)

## 2016-01-19 LAB — BASIC METABOLIC PANEL
BUN: 15 mg/dL (ref 4–21)
CREATININE: 0.9 mg/dL (ref 0.6–1.3)
GLUCOSE: 100 mg/dL
POTASSIUM: 4.8 mmol/L (ref 3.4–5.3)
Sodium: 140 mmol/L (ref 137–147)

## 2016-01-19 NOTE — Telephone Encounter (Signed)
Kevin [name] [id number] called from the [facility] pt states to her that Dr Derrel Nip is going to order lab work at his appt. Do you want pt to have lab work? If so send lab order to Fax number [phone]. Thank you!

## 2016-01-19 NOTE — Telephone Encounter (Signed)
Lab letter printed  

## 2016-01-19 NOTE — Telephone Encounter (Signed)
Please advise, last yearly labs were on 08/03/15, Last OV was 07/21/15 and has appt on 01/23/2016.  Thanks

## 2016-01-19 NOTE — Telephone Encounter (Signed)
Faxed letter

## 2016-01-20 ENCOUNTER — Telehealth: Payer: Self-pay | Admitting: Internal Medicine

## 2016-01-20 ENCOUNTER — Other Ambulatory Visit: Payer: Self-pay | Admitting: Internal Medicine

## 2016-01-20 NOTE — Telephone Encounter (Signed)
Patient INR= 3.5 current regimen is 4 mg on Mondays Wednesday Fridays and Sundays, 3 mg all other days.

## 2016-01-20 NOTE — Telephone Encounter (Signed)
Patient notified and voiced understanding.

## 2016-01-20 NOTE — Telephone Encounter (Signed)
Reduce dose to 4  Mg 3 days per week and 3 mg 4 days per week ,  Recheck in a week

## 2016-01-23 ENCOUNTER — Ambulatory Visit (INDEPENDENT_AMBULATORY_CARE_PROVIDER_SITE_OTHER): Payer: Medicare Other | Admitting: Internal Medicine

## 2016-01-23 ENCOUNTER — Encounter: Payer: Self-pay | Admitting: Internal Medicine

## 2016-01-23 VITALS — BP 120/60 | HR 70 | Temp 97.7°F | Resp 14 | Ht 65.0 in | Wt 150.8 lb

## 2016-01-23 DIAGNOSIS — E785 Hyperlipidemia, unspecified: Secondary | ICD-10-CM

## 2016-01-23 DIAGNOSIS — R5383 Other fatigue: Secondary | ICD-10-CM

## 2016-01-23 DIAGNOSIS — I10 Essential (primary) hypertension: Secondary | ICD-10-CM | POA: Diagnosis not present

## 2016-01-23 DIAGNOSIS — R0609 Other forms of dyspnea: Secondary | ICD-10-CM

## 2016-01-23 DIAGNOSIS — Z7901 Long term (current) use of anticoagulants: Secondary | ICD-10-CM

## 2016-01-23 DIAGNOSIS — F330 Major depressive disorder, recurrent, mild: Secondary | ICD-10-CM

## 2016-01-23 DIAGNOSIS — M5431 Sciatica, right side: Secondary | ICD-10-CM

## 2016-01-23 DIAGNOSIS — E559 Vitamin D deficiency, unspecified: Secondary | ICD-10-CM

## 2016-01-23 MED ORDER — DULOXETINE HCL 20 MG PO CPEP: 20.0000 mg | ORAL_CAPSULE | Freq: Every day | ORAL | Status: DC

## 2016-01-23 NOTE — Patient Instructions (Addendum)
For your chronic  Hip and pain ,  I suggest resuming the cymbalta (duloxetine ) every day in the evening starting with 20 mg.  This is an antidepressant that is used to relieve chronic pain    If,  After two weeks,  You have not had improvement in pain ,  We can increase the dose  To 40 mg    Your shortness of breath may be due to deconditioning due to lack of exercise  If your labs are normal,  I will make a referral to the PT dept at  [facility].

## 2016-01-23 NOTE — Progress Notes (Addendum)
Subjective:  Patient ID: Kevin [name], male    DOB: September 14, 1925  Age: 82 y.o. MRN: [id number]  CC: The primary encounter diagnosis was Other fatigue. Diagnoses of Long term current use of anticoagulant therapy, Hyperlipidemia, Essential hypertension, Sciatica of right side, Mild episode of recurrent major depressive disorder ([place]), Exertional dyspnea, and Vitamin D deficiency were also pertinent to this visit.  HPI ARIE DANKERS presents for 6 month follow up on chronic issues including persistent back pain secondary to severe scoliosis  And sciatica, right sided,  Cardiomyopathy secondary to arrhythmia and mitral valve replacement, and long term use of coumadin,    His  chronic right sided sciatica has been wearing him out lately,   And he reports multiple signs and symptoms of depression,  He  Reports fatigue with minimal exertion. Denies orthopnea and chest pain.   Doing very little bc of his pain . Had labs drawn last Wednesday  At [facility].  Saw his DO about his back pain.  No meds prescribed,      Outpatient Prescriptions Prior to Visit  Medication Sig Dispense Refill  . 5-Hydroxytryptophan (5-HTP PO) Take by mouth daily.    . ALPHA LIPOIC ACID PO Take by mouth daily.    Marland Kitchen amLODipine (NORVASC) 5 MG tablet Take 1 tablet (5 mg total) by mouth daily. 30 tablet 11  . Ascorbic Acid (VITAMIN C) 100 MG tablet Take 100 mg by mouth daily.    . brimonidine (ALPHAGAN) 0.15 % ophthalmic solution 2 (two) times daily.     . Calcium-Magnesium-Vitamin D (CALCIUM MAGNESIUM PO) Take by mouth 2 (two) times daily.    Marland Kitchen co-enzyme Q-10 30 MG capsule Take 1 capsule (30 mg total) by mouth daily. 90 capsule 3  . Coenzyme Q10-Levocarnitine (CO Q-10 PLUS PO) Take 1 capsule by mouth daily.    . DHA-EPA-Vit B6-B12-Folic Acid (CARDIOVID PLUS) CAPS Take 1 capsule by mouth 2 (two) times daily.     . ergocalciferol (VITAMIN D2) 50000 UNITS capsule Take 50,000 Units by mouth once a week.    . ferrous sulfate 324 (65 FE)  MG TBEC Take 1 tablet by mouth every other day.    . gabapentin (NEURONTIN) 100 MG capsule Take 1 capsule (100 mg total) by mouth 3 (three) times daily. 90 capsule 3  . Guggulipid (GUGULIPID PO) Take by mouth daily.    Marland Kitchen HYDROcodone-acetaminophen (NORCO/VICODIN) 5-325 MG per tablet Take 1 tablet by mouth at bedtime as needed for moderate pain. 30 tablet 0  . ketoconazole (NIZORAL) 2 % cream Apply 1 application topically 2 (two) times daily.     Marland Kitchen KETOPROFEN-KETAMINE-LIDOCAINE EX Apply 1 application topically 2 (two) times daily.    . L-GLUTAMINE PO Take by mouth.      . latanoprost (XALATAN) 0.005 % ophthalmic solution daily.     Marland Kitchen losartan (COZAAR) 100 MG tablet TAKE 1 TABLET BY MOUTH EVERY DAY 90 tablet 1  . LUMIGAN 0.01 % SOLN 1 drop at bedtime.     . meclizine (ANTIVERT) 12.5 MG tablet Take by mouth.    . metoprolol succinate (TOPROL-XL) 25 MG 24 hr tablet Take 12.5 mg by mouth daily.    . metoprolol tartrate (LOPRESSOR) 25 MG tablet TAKE 1 TABLET BY MOUTH TWICE DAILY 60 tablet 3  . Multiple Vitamin (MULTIVITAMIN) tablet Take 1 tablet by mouth daily.      . NON FORMULARY Memory Essentials 2 tablets BID.    Marland Kitchen NON FORMULARY Pro Liver Milk Thistle and Acetyl-Cysteine  1 tablet daily.    . Nutritional Supplements (MELATONIN PO) Take by mouth.    . Omega-3 Fatty Acids (OMEGA 3 PO) Take by mouth.      Marland Kitchen omeprazole (PRILOSEC) 40 MG capsule daily.     [place] Calcium 500 MG TABS Take two by mouth daily     . Red Yeast Rice Extract (RED YEAST RICE PO) Take by mouth.      . RUTIN PO Take by mouth.    . SYRINGE-NEEDLE, DISP, 3 ML (SAFETY-LOK 3CC SYR 22GX1.5") 22G X 1-1/2" 3 ML MISC 2 ml's monthly-given 1 shot monthly     . triamcinolone cream (KENALOG) 0.1 % daily.     . TURMERIC PO Take by mouth.      . warfarin (COUMADIN) 2 MG tablet TAKE 2 TABLETS BY MOUTH ON ALL DAYS, BUT TAKE 1 AND 1/2 TABLETS ON WEDNESDAY 60 tablet 0  . warfarin (COUMADIN) 2 MG tablet TAKE 2 TABLETS BY MOUTH ON ALL  DAYS, BUT TAKE 1 AND 1/2 TABLETS ON WEDNESDAY 60 tablet 2  . warfarin (COUMADIN) 2 MG tablet TAKE 2 TABLETS BY MOUTH ON ALL DAYS, BUT TAKE 1 AND 1/2 TABLETS ON WEDNESDAY 60 tablet 0  . warfarin (COUMADIN) 3 MG tablet TAKE 1 TABLET BY MOUTH ON TUESDAY, THURSDAY, SATURDAY, AND SUNDAY 60 tablet 2  . DULoxetine (CYMBALTA) 20 MG capsule Take 1 capsule by mouth daily.     No facility-administered medications prior to visit.    Review of Systems;  Patient denies headache, fevers, malaise, unintentional weight loss, skin rash, eye pain, sinus congestion and sinus pain, sore throat, dysphagia,  hemoptysis , cough, dyspnea, wheezing, chest pain, palpitations, orthopnea, edema, abdominal pain, nausea, melena, diarrhea, constipation, flank pain, dysuria, hematuria, urinary  Frequency, nocturia, numbness, tingling, seizures,  Focal weakness, Loss of consciousness,  Tremor, insomnia, depression, anxiety, and suicidal ideation.      Objective:  BP 120/60 mmHg  Pulse 70  Temp(Src) 97.7 F (36.5 C) (Oral)  Resp 14  Ht 5\' 5"  (1.651 m)  Wt 150 lb 12.8 oz (68.402 kg)  BMI 25.09 kg/m2  SpO2 98%  BP Readings from Last 3 Encounters:  01/23/16 120/60  08/17/15 140/62  07/21/15 102/50    Wt Readings from Last 3 Encounters:  01/23/16 150 lb 12.8 oz (68.402 kg)  08/17/15 149 lb 8 oz (67.813 kg)  07/21/15 147 lb 12 oz (67.019 kg)    General appearance: alert, cooperative and appears stated age Back: symmetric, no curvature. ROM normal. No CVA tenderness. Lungs: clear to auscultation bilaterally Heart: regular rate and rhythm, S1, S2 normal, no murmur, click, rub or gallop Abdomen: soft, non-tender; bowel sounds normal; no masses,  no organomegaly Pulses: 2+ and symmetric Skin: Skin color, texture, turgor normal. No rashes or lesions Lymph nodes: Cervical, supraclavicular, and axillary nodes normal. Psych: affect tired , makes good eye contact. No fidgeting,  Appears to be in pain .  Denies suicidal  thoughts    No results found for: HGBA1C  Lab Results  Component Value Date   CREATININE 0.9 01/19/2016   CREATININE 0.9 08/03/2015   CREATININE 0.89 12/20/2014    Lab Results  Component Value Date   WBC 4.4 12/20/2014   HGB 12.8* 12/20/2014   HCT 38.1* 12/20/2014   PLT 203 12/20/2014   GLUCOSE 76 12/20/2014   CHOL 196 01/19/2016   TRIG 67 01/19/2016   HDL 59 01/19/2016   LDLDIRECT 105.9 03/13/2012   LDLCALC 124 01/19/2016  ALT 39 01/19/2016   AST 25 01/19/2016   NA 140 01/19/2016   K 4.8 01/19/2016   CL 104 12/20/2014   CREATININE 0.9 01/19/2016   BUN 15 01/19/2016   CO2 30 12/20/2014   TSH 1.68 10/19/2014   INR 3.5* 01/18/2016    No results found.  Assessment & Plan:   Problem List Items Addressed This Visit    Long term current use of anticoagulant therapy    INR was 3.5 on Feb 9 on regimen of 24 mg weekly (4 mg alt with 3 mg ).  No changes to regimen but repeat in one week was advised.       Hypertension    Well controlled on current regimen. Renal function stable, no changes today. Lab Results  Component Value Date   CREATININE 0.9 01/19/2016   Lab Results  Component Value Date   NA 140 01/19/2016   K 4.8 01/19/2016   CL 104 12/20/2014   CO2 30 12/20/2014         Sciatica of right side    Starting cymbalta for chronic pain leading to depression , mild.       Relevant Medications   DULoxetine (CYMBALTA) 20 MG capsule   Hyperlipidemia    Lab Results  Component Value Date   CHOL 196 01/19/2016   HDL 59 01/19/2016   LDLCALC 124 01/19/2016   LDLDIRECT 105.9 03/13/2012   TRIG 67 01/19/2016    the risks outweigh the benefits of starting a statin in an 82 yr old with no history of CAD, given his complaint of hand weakness after starting Lipitor. .  repeat fasting lipids are acceptable       Exertional dyspnea    He has no signs of heart failure or respiratory distress and ambulatory sats were unchanged.  symptoms are likely due to  deconditioning.  CBC will be repeated next week with INR to rule out anemia.  Lab Results  Component Value Date   TSH 1.68 10/19/2014   Lab Results  Component Value Date   WBC 4.4 12/20/2014   HGB 12.8* 12/20/2014   HCT 38.1* 12/20/2014   MCV 101* 12/20/2014   PLT 203 12/20/2014         Major depressive disorder, recurrent episode ([place])    Aggravated by chronic pain and decreased mobility,  Resuming of cymbalta advised.       Relevant Medications   DULoxetine (CYMBALTA) 20 MG capsule   Vitamin D deficiency    Continue weekly Drisdol for current level of 26      Fatigue - Primary      I have discontinued Mr. Mas's Alpha-Lipoic Acid. I have also changed his DULoxetine. Additionally, I am having him maintain his SYRINGE-NEEDLE (DISP) 3 ML, [place] Calcium, L-GLUTAMINE PO, Omega-3 Fatty Acids (OMEGA 3 PO), Red Yeast Rice Extract (RED YEAST RICE PO), TURMERIC PO, multivitamin, brimonidine, latanoprost, Nutritional Supplements (MELATONIN PO), ergocalciferol, DHA-EPA-Vit B6-B12-Folic Acid, LUMIGAN, omeprazole, triamcinolone cream, NON FORMULARY, NON FORMULARY, 5-Hydroxytryptophan (5-HTP PO), Guggulipid (GUGULIPID PO), Calcium-Magnesium-Vitamin D (CALCIUM MAGNESIUM PO), ALPHA LIPOIC ACID PO, vitamin C, RUTIN PO, ketoconazole, ferrous sulfate, KETOPROFEN-KETAMINE-LIDOCAINE EX, gabapentin, metoprolol succinate, co-enzyme Q-10, Coenzyme Q10-Levocarnitine (CO Q-10 PLUS PO), meclizine, amLODipine, HYDROcodone-acetaminophen, warfarin, warfarin, warfarin, metoprolol tartrate, warfarin, and losartan.  Meds ordered this encounter  Medications  . DISCONTD: Alpha-Lipoic Acid 50 MG CAPS    Sig: Take by mouth.  . DULoxetine (CYMBALTA) 20 MG capsule    Sig: Take 1 capsule (20 mg total) by  mouth daily.    Dispense:  30 capsule    Refill:  2    Medications Discontinued During This Encounter  Medication Reason  . Alpha-Lipoic Acid 50 MG CAPS Duplicate  . DULoxetine (CYMBALTA) 20 MG capsule  Reorder   A total of 25 minutes of face to face time was spent with patient more than half of which was spent in counselling about the above mentioned conditions  and coordination of care  Follow-up: No Follow-up on file.   Crecencio Mc, MD

## 2016-01-23 NOTE — Progress Notes (Signed)
Pre visit review using our clinic review tool, if applicable. No additional management support is needed unless otherwise documented below in the visit note. 

## 2016-01-24 DIAGNOSIS — F339 Major depressive disorder, recurrent, unspecified: Secondary | ICD-10-CM | POA: Insufficient documentation

## 2016-01-24 DIAGNOSIS — E559 Vitamin D deficiency, unspecified: Secondary | ICD-10-CM | POA: Insufficient documentation

## 2016-01-24 NOTE — Assessment & Plan Note (Signed)
Lab Results  Component Value Date   CHOL 196 01/19/2016   HDL 59 01/19/2016   LDLCALC 124 01/19/2016   LDLDIRECT 105.9 03/13/2012   TRIG 67 01/19/2016    the risks outweigh the benefits of starting a statin in an 82 yr old with no history of CAD, given his complaint of hand weakness after starting Lipitor. .  repeat fasting lipids are acceptable

## 2016-01-24 NOTE — Assessment & Plan Note (Signed)
Starting cymbalta for chronic pain leading to depression , mild.

## 2016-01-24 NOTE — Assessment & Plan Note (Addendum)
Aggravated by chronic pain and decreased mobility,  Resuming of cymbalta advised.

## 2016-01-24 NOTE — Assessment & Plan Note (Signed)
Continue weekly Drisdol for current level of 26

## 2016-01-24 NOTE — Assessment & Plan Note (Addendum)
INR was 3.5 on Feb 9 on regimen of 24 mg weekly (4 mg alt with 3 mg ).  No changes to regimen but repeat in one week was advised.

## 2016-01-24 NOTE — Assessment & Plan Note (Signed)
He has no signs of heart failure or respiratory distress and ambulatory sats were unchanged.  symptoms are likely due to deconditioning.  CBC will be repeated next week with INR to rule out anemia.  Lab Results  Component Value Date   TSH 1.68 10/19/2014   Lab Results  Component Value Date   WBC 4.4 12/20/2014   HGB 12.8* 12/20/2014   HCT 38.1* 12/20/2014   MCV 101* 12/20/2014   PLT 203 12/20/2014

## 2016-01-24 NOTE — Assessment & Plan Note (Signed)
Well controlled on current regimen. Renal function stable, no changes today. Lab Results  Component Value Date   CREATININE 0.9 01/19/2016   Lab Results  Component Value Date   NA 140 01/19/2016   K 4.8 01/19/2016   CL 104 12/20/2014   CO2 30 12/20/2014

## 2016-01-27 LAB — POCT INR: INR: 2.5 — AB (ref 0.9–1.1)

## 2016-01-31 ENCOUNTER — Telehealth: Payer: Self-pay

## 2016-01-31 ENCOUNTER — Telehealth: Payer: Self-pay | Admitting: Internal Medicine

## 2016-01-31 NOTE — Telephone Encounter (Signed)
Ok,  that's very helpful,  Thank you.

## 2016-01-31 NOTE — Telephone Encounter (Signed)
Pt states that he was able to urinate but it is not coming as it would normally. Pt recalls going to the bathroom 3-4 times a day. Before starting the medication he was urinating 5-6 times day.

## 2016-01-31 NOTE — Telephone Encounter (Signed)
Coumadin level is therapeutic,  Continue current regimen and repeat PT/INR in one month 

## 2016-01-31 NOTE — Telephone Encounter (Signed)
Patient INR = 2.5 (4 Mg 3 days per week and 3 mg 4 days per week )

## 2016-01-31 NOTE — Telephone Encounter (Signed)
He should stop the medication immediately..  When patient states that he cannot urinate, to  a doctor that is a medical emergency because if he is  unable to void at all he can develop kidney failure.   Please clarify with patients about what they mean when they say they can't urinate:  How long has it been since he/she last passed any water?

## 2016-01-31 NOTE — Telephone Encounter (Signed)
Patient notified and voiced understanding will repeat INR in one month.

## 2016-01-31 NOTE — Telephone Encounter (Signed)
Pt feels he is having a reaction to DULoxetine (CYMBALTA) 20 MG capsule. He states that at 3 am he wakes up and can not go back to sleep, pt can not urinate, and constipation, pt feels miserable. Kevin [name] wants to know if he should discontinue taking this med. Pt recalls symptoms starting 3 days ago. Please advise

## 2016-02-13 LAB — POCT INR: INR: 2.3 — AB (ref 0.9–1.1)

## 2016-02-15 ENCOUNTER — Other Ambulatory Visit: Payer: Self-pay | Admitting: Internal Medicine

## 2016-02-15 ENCOUNTER — Telehealth: Payer: Self-pay | Admitting: Internal Medicine

## 2016-02-15 NOTE — Telephone Encounter (Signed)
Patient INR = 2.3 (4 Mg 3 days per week and 3 mg 4 days per week )

## 2016-02-15 NOTE — Telephone Encounter (Signed)
INR is a little low on current regimen ( 4 mg alt with 3 mg ) ,    Increase dose to 4 mg 5 days per week,  3 mg 2 days per week .  Recheck in 2 weeks

## 2016-02-16 ENCOUNTER — Telehealth: Payer: Self-pay | Admitting: Internal Medicine

## 2016-02-16 NOTE — Telephone Encounter (Signed)
Spoke with patient, verbalized understanding of results and will make dosage changes and repeat the INR in 2 weeks.

## 2016-02-28 ENCOUNTER — Telehealth: Payer: Self-pay | Admitting: Internal Medicine

## 2016-02-28 LAB — POCT INR: INR: 3 — AB (ref 0.9–1.1)

## 2016-02-28 NOTE — Telephone Encounter (Signed)
Coumadin level is therapeutic,  Continue current regimen and repeat PT/INR in one month 

## 2016-02-28 NOTE — Telephone Encounter (Signed)
Received INR = 3.0

## 2016-02-29 NOTE — Telephone Encounter (Signed)
Patient notified and voiced understanding.

## 2016-03-01 ENCOUNTER — Telehealth: Payer: Self-pay | Admitting: Internal Medicine

## 2016-03-14 ENCOUNTER — Other Ambulatory Visit: Payer: Self-pay | Admitting: Internal Medicine

## 2016-03-20 LAB — PROTIME-INR: INR: 3.2 — AB (ref 0.9–1.1)

## 2016-03-27 ENCOUNTER — Telehealth: Payer: Self-pay | Admitting: Internal Medicine

## 2016-03-27 NOTE — Telephone Encounter (Signed)
  Your INR/coumadin level is therapeutic,  Continue current regimen and repeat PT/INR in one month.   

## 2016-03-27 NOTE — Telephone Encounter (Signed)
Received INR = 3.2 coumadin regimen is (4 Mg 3 days per week and 3 mg 4 days per week )

## 2016-03-27 NOTE — Telephone Encounter (Signed)
Patient notified and voiced understanding.

## 2016-04-10 ENCOUNTER — Other Ambulatory Visit: Payer: Self-pay | Admitting: Internal Medicine

## 2016-04-10 ENCOUNTER — Other Ambulatory Visit: Payer: Self-pay | Admitting: Cardiovascular Disease

## 2016-04-17 ENCOUNTER — Encounter: Payer: Self-pay | Admitting: Internal Medicine

## 2016-04-17 LAB — PROTIME-INR: Protime: 31.2 seconds — AB (ref 10.0–13.8)

## 2016-04-17 LAB — POCT INR: INR: 3.1 — AB (ref 0.9–1.1)

## 2016-04-25 ENCOUNTER — Telehealth: Payer: Self-pay | Admitting: Internal Medicine

## 2016-04-25 NOTE — Telephone Encounter (Signed)
Patient notified and voiced understanding.

## 2016-04-25 NOTE — Telephone Encounter (Signed)
Coumadin level is therapeutic,  Continue current regimen and repeat PT/INR in one month 

## 2016-06-25 LAB — PROTIME-INR

## 2016-06-26 ENCOUNTER — Telehealth: Payer: Self-pay | Admitting: Internal Medicine

## 2016-06-26 NOTE — Telephone Encounter (Signed)
Expand All Collapse All   Received INR = 2.8 coumadin regimen is (4 Mg 3 days per week and 3 mg 4 days per week )

## 2016-06-26 NOTE — Telephone Encounter (Signed)
.  Coumadin level is slightly low,  Change regimen to 4 mg 4 days per week and 3 mg 3 days per week repeat  PT/INR in two weeks

## 2016-06-27 NOTE — Telephone Encounter (Signed)
Left message for patient to return call to office. 

## 2016-06-28 NOTE — Telephone Encounter (Signed)
Patient notified and voiced understanding.

## 2016-07-10 ENCOUNTER — Encounter: Payer: Self-pay | Admitting: Internal Medicine

## 2016-07-10 LAB — PROTIME-INR: INR: 2.5 — AB (ref 0.9–1.1)

## 2016-07-17 ENCOUNTER — Encounter: Payer: Self-pay | Admitting: Internal Medicine

## 2016-07-17 ENCOUNTER — Other Ambulatory Visit: Payer: Self-pay | Admitting: Internal Medicine

## 2016-07-18 ENCOUNTER — Telehealth: Payer: Self-pay | Admitting: Internal Medicine

## 2016-07-18 NOTE — Telephone Encounter (Signed)
[id number] 

## 2016-07-18 NOTE — Telephone Encounter (Signed)
Pt left a vm on office phone. He says he is returning a phone call from this office. Please call him.

## 2016-07-18 NOTE — Telephone Encounter (Signed)
Spoke with patient, reviewed note. thanks

## 2016-07-18 NOTE — Telephone Encounter (Signed)
I hope you are well .  Your coumadin level is therapeutic,  Continue current regimen and repeat PT/INR in one month.  Regards,  Dr. Ernesha Ramone

## 2016-07-19 ENCOUNTER — Other Ambulatory Visit: Payer: Self-pay | Admitting: Cardiovascular Disease

## 2016-07-19 LAB — POCT INR: INR: 3.4 — AB (ref 0.9–1.1)

## 2016-07-23 ENCOUNTER — Telehealth: Payer: Self-pay | Admitting: Internal Medicine

## 2016-07-23 ENCOUNTER — Encounter: Payer: Self-pay | Admitting: Internal Medicine

## 2016-07-23 ENCOUNTER — Ambulatory Visit (INDEPENDENT_AMBULATORY_CARE_PROVIDER_SITE_OTHER): Payer: Medicare Other | Admitting: Internal Medicine

## 2016-07-23 VITALS — BP 112/62 | HR 64 | Temp 97.8°F | Resp 14 | Wt 149.0 lb

## 2016-07-23 DIAGNOSIS — R0609 Other forms of dyspnea: Secondary | ICD-10-CM

## 2016-07-23 DIAGNOSIS — M5431 Sciatica, right side: Secondary | ICD-10-CM

## 2016-07-23 DIAGNOSIS — I10 Essential (primary) hypertension: Secondary | ICD-10-CM

## 2016-07-23 DIAGNOSIS — Z7901 Long term (current) use of anticoagulants: Secondary | ICD-10-CM | POA: Diagnosis not present

## 2016-07-23 DIAGNOSIS — M6281 Muscle weakness (generalized): Secondary | ICD-10-CM | POA: Diagnosis not present

## 2016-07-23 LAB — COMPREHENSIVE METABOLIC PANEL
ALK PHOS: 61 U/L (ref 39–117)
ALT: 19 U/L (ref 0–53)
AST: 32 U/L (ref 0–37)
Albumin: 4.2 g/dL (ref 3.5–5.2)
BILIRUBIN TOTAL: 0.6 mg/dL (ref 0.2–1.2)
BUN: 12 mg/dL (ref 6–23)
CO2: 29 mEq/L (ref 19–32)
CREATININE: 0.86 mg/dL (ref 0.40–1.50)
Calcium: 9.3 mg/dL (ref 8.4–10.5)
Chloride: 101 mEq/L (ref 96–112)
GFR: 88.98 mL/min (ref >60.00)
GLUCOSE: 73 mg/dL (ref 70–99)
Potassium: 4 mEq/L (ref 3.5–5.1)
Sodium: 135 mEq/L (ref 135–145)
TOTAL PROTEIN: 6.6 g/dL (ref 6.0–8.3)

## 2016-07-23 LAB — CK: Total CK: 109 U/L (ref 7–232)

## 2016-07-23 LAB — TSH: TSH: 1.21 u[IU]/mL (ref 0.35–4.50)

## 2016-07-23 NOTE — Patient Instructions (Addendum)
Dr Clayborn Bigness ordered an ECHO on your heart.  I t is Unclear if this was supposed to happen after your visit in April,  or prior to your next appointment  Please call his office to see when that ECHO has been set up for you and let me know     If your leg pain is controlled with just tylenol,  continue using it .  You can take Up to 2000 mg daily safely .  If your pain is still severe an hour after taking the tylenol,  You can take a 1/2 tramadol.    Go back to the pool and spend 5 minutes in it 3 days per week ,   just stretching and standing .  Practice  lifting your knees up while seated in a chair EVERY DAY

## 2016-07-23 NOTE — Telephone Encounter (Signed)
Mr. Deloe called saying Dr. Derrel Nip wants to know when his EKG is scheduled. It's scheduled on September 12th, 2017. You can call him if needed.  Pt's ph# [phone] Thank you.

## 2016-07-23 NOTE — Telephone Encounter (Signed)
FYI

## 2016-07-23 NOTE — Progress Notes (Signed)
Pre visit review using our clinic review tool, if applicable. No additional management support is needed unless otherwise documented below in the visit note. 

## 2016-07-23 NOTE — Progress Notes (Signed)
Subjective:  Patient ID: Kevin [name], male    DOB: 26-Jun-1925  Age: 82 y.o. MRN: [id number]  CC: The primary encounter diagnosis was Generalized muscle weakness. Diagnoses of Benign essential HTN, Exertional dyspnea, Long term current use of anticoagulant therapy, and Sciatica of right side were also pertinent to this visit.  HPI Kevin [name] presents for  6 month follow up on hypertension, chronic pain (not tolerant of cymbalta) due to kyphoscoliosis,  Long term anticoagulation secondary to prosthetic valve.   HTN:  Dizziness improved with reduction in amlodipine dose to 2.5 mg daily.  He continues to take  lopressor as well .  Recent BP readings have been age appropriate. Has prn meclizine, has not used it in over a month   Cardiomyopathy secondary to arial fib and valvulopathy:  S/p mitral valve repair.  He  Saw [facility] in April  No changes. ECHO ordered for Sepember prior to next cardiology visit    Feels he is getting weaker.  Has stopped going to the swimming pool because it tired him out too much even after only 20 minutes.  He was working on his own for 20 minutes  walking and stretching.  Currently doing exercises in bed twice daily after the last PT session  Lives at the [facility], independent living. He had a minor fall this summer which occurred at home while rising from a chair.  He fell forward,  No injuries. . Home safety reviewed:  He has a walk in shower ,  A safety bar, and a plastic chair.  He does NOT want a lift chair.  He is using a cane around the house and a walker when he goes to the dining hall.   Uses [employer] din[employer] hall for lunch,  Takes his other  meals in his apartment    Constant right sided leg pain from his hip to his knee.  [facility] gave him rx for tramadol and he was afraid to take it after reading the insert.  Still using only tylenol once or twice per week  Chronic anticoagulation: with coumadin, dose managed by me, using 3 mg alt with 4 mg INR WNL  last weel     Outpatient Medications Prior to Visit  Medication Sig Dispense Refill  . 5-Hydroxytryptophan (5-HTP PO) Take by mouth daily.    . ALPHA LIPOIC ACID PO Take by mouth daily.    Marland Kitchen amLODipine (NORVASC) 5 MG tablet Take 1 tablet (5 mg total) by mouth daily. (Patient taking differently: Take 5 mg by mouth daily. Takes 1/2 tablet daily) 30 tablet 11  . Ascorbic Acid (VITAMIN C) 100 MG tablet Take 100 mg by mouth daily.    . brimonidine (ALPHAGAN) 0.15 % ophthalmic solution 2 (two) times daily.     . Calcium-Magnesium-Vitamin D (CALCIUM MAGNESIUM PO) Take by mouth 2 (two) times daily.    Marland Kitchen co-enzyme Q-10 30 MG capsule Take 1 capsule (30 mg total) by mouth daily. 90 capsule 3  . Coenzyme Q10-Levocarnitine (CO Q-10 PLUS PO) Take 1 capsule by mouth daily.    . DHA-EPA-Vit B6-B12-Folic Acid (CARDIOVID PLUS) CAPS Take 1 capsule by mouth 2 (two) times daily.     . ferrous sulfate 324 (65 FE) MG TBEC Take 1 tablet by mouth every other day.    . gabapentin (NEURONTIN) 100 MG capsule Take 1 capsule (100 mg total) by mouth 3 (three) times daily. 90 capsule 3  . Guggulipid (GUGULIPID PO) Take by mouth daily.    Marland Kitchen  ketoconazole (NIZORAL) 2 % cream Apply 1 application topically 2 (two) times daily.     Marland Kitchen KETOPROFEN-KETAMINE-LIDOCAINE EX Apply 1 application topically 2 (two) times daily.    . L-GLUTAMINE PO Take by mouth.      . latanoprost (XALATAN) 0.005 % ophthalmic solution daily.     Marland Kitchen losartan (COZAAR) 100 MG tablet TAKE 1 TABLET BY MOUTH EVERY DAY 90 tablet 1  . LUMIGAN 0.01 % SOLN 1 drop at bedtime.     . metoprolol tartrate (LOPRESSOR) 25 MG tablet TAKE 1 TABLET BY MOUTH TWICE DAILY 60 tablet 0  . Multiple Vitamin (MULTIVITAMIN) tablet Take 1 tablet by mouth daily.      . NON FORMULARY Memory Essentials 2 tablets BID.    Marland Kitchen NON FORMULARY Pro Liver Milk Thistle and Acetyl-Cysteine 1 tablet daily.    . Nutritional Supplements (MELATONIN PO) Take by mouth.    . Omega-3 Fatty Acids (OMEGA 3  PO) Take by mouth.      Loma Boston Calcium 500 MG TABS Take two by mouth daily     . Red Yeast Rice Extract (RED YEAST RICE PO) Take by mouth.      . RUTIN PO Take by mouth.    . SYRINGE-NEEDLE, DISP, 3 ML (SAFETY-LOK 3CC SYR 22GX1.5") 22G X 1-1/2" 3 ML MISC 2 ml's monthly-given 1 shot monthly     . TURMERIC PO Take by mouth.      . warfarin (COUMADIN) 2 MG tablet TAKE 2 TABLETS BY MOUTH ON ALL DAYS, BUT TAKE 1 AND 1/2 TABLETS ON WEDNESDAY 60 tablet 2  . warfarin (COUMADIN) 2 MG tablet TAKE 2 TABLETS BY MOUTH ON ALL DAYS, BUT TAKE 1 AND 1/2 TABLETS ON WEDNESDAY 60 tablet 0  . warfarin (COUMADIN) 2 MG tablet TAKE 2 TABLETS BY MOUTH ON ALL DAYS, BUT TAKE 1 AND 1/2 TABLETS ON WEDNESDAY 60 tablet 0  . warfarin (COUMADIN) 2 MG tablet TAKE 2 TABLETS BY MOUTH ON ALL DAYS, BUT TAKE 1 AND 1/2 TABLETS ON WEDNESDAY 180 tablet 1  . HYDROcodone-acetaminophen (NORCO/VICODIN) 5-325 MG per tablet Take 1 tablet by mouth at bedtime as needed for moderate pain. 30 tablet 0  . meclizine (ANTIVERT) 12.5 MG tablet Take by mouth.    . metoprolol succinate (TOPROL-XL) 25 MG 24 hr tablet Take 12.5 mg by mouth daily.    Marland Kitchen omeprazole (PRILOSEC) 40 MG capsule daily.     Marland Kitchen triamcinolone cream (KENALOG) 0.1 % daily.     Marland Kitchen warfarin (COUMADIN) 2 MG tablet TAKE 2 TABLETS BY MOUTH ON ALL DAYS, BUT TAKE 1 AND 1/2 TABLETS ON WEDNESDAY 60 tablet 0  . warfarin (COUMADIN) 2 MG tablet TAKE 2 TABLETS BY MOUTH ON ALL DAYS, BUT TAKE 1 AND 1/2 TABLETS ON WEDNESDAY 60 tablet 0  . warfarin (COUMADIN) 3 MG tablet TAKE 1 TABLET BY MOUTH ON TUESDAY, THURSDAY, SATURDAY, AND SUNDAY 60 tablet 2  . DULoxetine (CYMBALTA) 20 MG capsule Take 1 capsule (20 mg total) by mouth daily. 30 capsule 2  . ergocalciferol (VITAMIN D2) 50000 UNITS capsule Take 50,000 Units by mouth once a week.     No facility-administered medications prior to visit.     Review of Systems;  Patient denies headache, fevers, malaise, unintentional weight loss, skin rash,  eye pain, sinus congestion and sinus pain, sore throat, dysphagia,  hemoptysis , cough, dyspnea, wheezing, chest pain, palpitations, orthopnea, edema, abdominal pain, nausea, melena, diarrhea, constipation, flank pain, dysuria, hematuria, urinary  Frequency, nocturia, numbness,  tingling, seizures,  Focal weakness, Loss of consciousness,  Tremor, insomnia, depression, anxiety, and suicidal ideation.      Objective:  BP 112/62   Pulse 64   Temp 97.8 F (36.6 C)   Resp 14   Wt 149 lb (67.6 kg)   BMI 24.79 kg/m   BP Readings from Last 3 Encounters:  07/23/16 112/62  01/23/16 120/60  08/17/15 140/62    Wt Readings from Last 3 Encounters:  07/23/16 149 lb (67.6 kg)  01/23/16 150 lb 12.8 oz (68.4 kg)  08/17/15 149 lb 8 oz (67.8 kg)    General appearance: alert, cooperative and appears stated age Ears: normal TM's and external ear canals both ears Throat: lips, mucosa, and tongue normal; teeth and gums normal Neck: no adenopathy, no carotid bruit, supple, symmetrical, trachea midline and thyroid not enlarged, symmetric, no tenderness/mass/nodules Back: symmetric, no curvature. ROM normal. No CVA tenderness. Lungs: clear to auscultation bilaterally Heart: regular rate and rhythm, S1, S2 normal, no murmur, click, rub or gallop Abdomen: soft, non-tender; bowel sounds normal; no masses,  no organomegaly Pulses: 2+ and symmetric Skin: Skin color, texture, turgor normal. No rashes or lesions Lymph nodes: Cervical, supraclavicular, and axillary nodes normal.  No results found for: HGBA1C  Lab Results  Component Value Date   CREATININE 0.86 07/23/2016   CREATININE 0.9 01/19/2016   CREATININE 0.9 08/03/2015    Lab Results  Component Value Date   WBC 4.4 12/20/2014   HGB 12.8 (L) 12/20/2014   HCT 38.1 (L) 12/20/2014   PLT 203 12/20/2014   GLUCOSE 73 07/23/2016   CHOL 196 01/19/2016   TRIG 67 01/19/2016   HDL 59 01/19/2016   LDLDIRECT 105.9 03/13/2012   LDLCALC 124 01/19/2016     ALT 19 07/23/2016   AST 32 07/23/2016   NA 135 07/23/2016   K 4.0 07/23/2016   CL 101 07/23/2016   CREATININE 0.86 07/23/2016   BUN 12 07/23/2016   CO2 29 07/23/2016   TSH 1.21 07/23/2016   INR 3.4 (A) 07/19/2016       Assessment & Plan:   Problem List Items Addressed This Visit    Long term current use of anticoagulant therapy    INR is at goal on  24 mg weekly (4 mg alt with 3 mg ).  No changes to regimen but repeat in one month was advised.   Lab Results  Component Value Date   INR 3.4 (A) 07/19/2016   INR 3.1 (A) 04/17/2016   INR 3.2 (A) 03/20/2016   PROTIME 31.2 (A) 04/17/2016   PROTIME 33.7 (A) 08/03/2015   PROTIME 34.4 (A) 07/06/2015         Sciatica of right side    Did not tolerate  cymbalta for chronic pain . Continue tylenol prn.         Exertional dyspnea    He has no signs of heart failure or respiratory distress and ambulatory sats  Are normal.  symptoms are likely due to deconditioning.  ECHO is scheduled to be repeated next month by Dr Clayborn Bigness. Lab Results  Component Value Date   TSH 1.21 07/23/2016   Lab Results  Component Value Date   WBC 4.4 12/20/2014   HGB 12.8 (L) 12/20/2014   HCT 38.1 (L) 12/20/2014   MCV 101 (H) 12/20/2014   PLT 203 12/20/2014         Benign essential HTN    Well controlled on current regimen. Renal function stable, no changes today.  Lab Results  Component Value Date   CREATININE 0.86 07/23/2016   Lab Results  Component Value Date   NA 135 07/23/2016   K 4.0 07/23/2016   CL 101 07/23/2016   CO2 29 07/23/2016         Generalized muscle weakness - Primary    Due to inactivity .  Recent fall due to proximal muscle weakness,  Pt offered but declined .  CK normal.  Encouraged to resume trips to the pool to work his legs.   Lab Results  Component Value Date   CKTOTAL 109 07/23/2016         Relevant Orders   CK (Completed)   TSH (Completed)   Comprehensive metabolic panel (Completed)    Other  Visit Diagnoses   None.   A total of 25 minutes of face to face time was spent with patient more than half of which was spent in counselling about the above mentioned conditions  and coordination of care   I have discontinued Mr. Avis's ergocalciferol, omeprazole, triamcinolone cream, metoprolol succinate, meclizine, HYDROcodone-acetaminophen, and DULoxetine. I am also having him maintain his SYRINGE-NEEDLE (DISP) 3 ML, Oyster Shell Calcium, L-GLUTAMINE PO, Omega-3 Fatty Acids (OMEGA 3 PO), Red Yeast Rice Extract (RED YEAST RICE PO), TURMERIC PO, multivitamin, brimonidine, latanoprost, Nutritional Supplements (MELATONIN PO), DHA-EPA-Vit B6-B12-Folic Acid, LUMIGAN, NON FORMULARY, NON FORMULARY, 5-Hydroxytryptophan (5-HTP PO), Guggulipid (GUGULIPID PO), Calcium-Magnesium-Vitamin D (CALCIUM MAGNESIUM PO), ALPHA LIPOIC ACID PO, vitamin C, RUTIN PO, ketoconazole, ferrous sulfate, KETOPROFEN-KETAMINE-LIDOCAINE EX, gabapentin, co-enzyme Q-10, Coenzyme Q10-Levocarnitine (CO Q-10 PLUS PO), amLODipine, warfarin, warfarin, warfarin, warfarin, losartan, metoprolol tartrate, and cholecalciferol.  Meds ordered this encounter  Medications  . cholecalciferol (VITAMIN D) 1000 units tablet    Sig: Take 1,000 Units by mouth daily.    Medications Discontinued During This Encounter  Medication Reason  . DULoxetine (CYMBALTA) 20 MG capsule Side effect (s)  . ergocalciferol (VITAMIN D2) 50000 UNITS capsule Completed Course  . HYDROcodone-acetaminophen (NORCO/VICODIN) 5-325 MG per tablet   . metoprolol succinate (TOPROL-XL) 25 MG 24 hr tablet   . omeprazole (PRILOSEC) 40 MG capsule   . triamcinolone cream (KENALOG) 0.1 %   . warfarin (COUMADIN) 2 MG tablet   . warfarin (COUMADIN) 2 MG tablet   . warfarin (COUMADIN) 3 MG tablet   . meclizine (ANTIVERT) 12.5 MG tablet     Follow-up: Return in about 6 months (around 01/23/2017).   Crecencio Mc, MD

## 2016-07-24 DIAGNOSIS — M6281 Muscle weakness (generalized): Secondary | ICD-10-CM | POA: Insufficient documentation

## 2016-07-24 NOTE — Assessment & Plan Note (Signed)
Due to inactivity .  Recent fall due to proximal muscle weakness,  Pt offered but declined .  CK normal.  Encouraged to resume trips to the pool to work his legs.   Lab Results  Component Value Date   CKTOTAL 109 07/23/2016

## 2016-07-24 NOTE — Assessment & Plan Note (Signed)
Did not tolerate  cymbalta for chronic pain . Continue tylenol prn.

## 2016-07-24 NOTE — Assessment & Plan Note (Signed)
He has no signs of heart failure or respiratory distress and ambulatory sats  Are normal.  symptoms are likely due to deconditioning.  ECHO is scheduled to be repeated next month by Dr Clayborn Bigness. Lab Results  Component Value Date   TSH 1.21 07/23/2016   Lab Results  Component Value Date   WBC 4.4 12/20/2014   HGB 12.8 (L) 12/20/2014   HCT 38.1 (L) 12/20/2014   MCV 101 (H) 12/20/2014   PLT 203 12/20/2014

## 2016-07-24 NOTE — Assessment & Plan Note (Addendum)
INR is at goal on  24 mg weekly (4 mg alt with 3 mg ).  No changes to regimen but repeat in one month was advised.   Lab Results  Component Value Date   INR 3.4 (A) 07/19/2016   INR 3.1 (A) 04/17/2016   INR 3.2 (A) 03/20/2016   PROTIME 31.2 (A) 04/17/2016   PROTIME 33.7 (A) 08/03/2015   PROTIME 34.4 (A) 07/06/2015

## 2016-07-24 NOTE — Assessment & Plan Note (Signed)
Well controlled on current regimen. Renal function stable, no changes today.  Lab Results  Component Value Date   CREATININE 0.86 07/23/2016   Lab Results  Component Value Date   NA 135 07/23/2016   K 4.0 07/23/2016   CL 101 07/23/2016   CO2 29 07/23/2016

## 2016-07-26 ENCOUNTER — Encounter: Payer: Self-pay | Admitting: *Deleted

## 2016-08-08 LAB — PROTIME-INR: INR: 2.6 — AB (ref 0.9–1.1)

## 2016-08-09 ENCOUNTER — Telehealth: Payer: Self-pay | Admitting: Internal Medicine

## 2016-08-09 NOTE — Telephone Encounter (Signed)
Patient INR= 2.6 coumadin regimen  4 mg 4 days per week and 3 mg 3 days

## 2016-08-09 NOTE — Telephone Encounter (Signed)
Spoke with patient, verbalized understanding, thanks

## 2016-08-09 NOTE — Telephone Encounter (Signed)
  Your INR/coumadin level is therapeutic,  Continue current regimen and repeat PT/INR in one month.   

## 2016-08-16 ENCOUNTER — Ambulatory Visit (INDEPENDENT_AMBULATORY_CARE_PROVIDER_SITE_OTHER): Payer: Medicare Other | Admitting: Cardiovascular Disease

## 2016-08-16 ENCOUNTER — Encounter: Payer: Self-pay | Admitting: Cardiovascular Disease

## 2016-08-16 VITALS — BP 140/72 | HR 65 | Ht 67.0 in | Wt 146.8 lb

## 2016-08-16 DIAGNOSIS — I4891 Unspecified atrial fibrillation: Secondary | ICD-10-CM

## 2016-08-16 DIAGNOSIS — I10 Essential (primary) hypertension: Secondary | ICD-10-CM | POA: Diagnosis not present

## 2016-08-16 DIAGNOSIS — Z952 Presence of prosthetic heart valve: Secondary | ICD-10-CM

## 2016-08-16 DIAGNOSIS — Z954 Presence of other heart-valve replacement: Secondary | ICD-10-CM

## 2016-08-16 DIAGNOSIS — M6281 Muscle weakness (generalized): Secondary | ICD-10-CM

## 2016-08-16 DIAGNOSIS — I429 Cardiomyopathy, unspecified: Secondary | ICD-10-CM | POA: Diagnosis not present

## 2016-08-16 DIAGNOSIS — F330 Major depressive disorder, recurrent, mild: Secondary | ICD-10-CM

## 2016-08-16 DIAGNOSIS — I483 Typical atrial flutter: Secondary | ICD-10-CM

## 2016-08-16 NOTE — Progress Notes (Signed)
Cardiology Office Note  Date:  08/16/2016   ID:  Kevin [name], DOB 11/15/1925, MRN [id number]  PCP:  Kevin Mc, MD   Chief Complaint  Patient presents with  . Other    12 month follow up. Meds reviewed by the patient verbally. "doing well."     HPI:  Kevin [name] is a very pleasant 82 year old gentleman, patient of Kevin [name], also seen by Kevin [name], with a history of mitral valve replacement with [employer] Jude valve in 1994, history of atrial fibrillation, mild restriction on pulmonary function tests who presented in 2014 to our office with worsening shortness of breath, found to be in atrial flutter.  cardioversion was performed and he converted to normal sinus rhythm with one shock. He presents today for routine follow-up of his atrial flutter.   Notes indicate a Holter monitor confirming bradycardia. Pacemaker was placed by Dr.  Josefa [name] at [facility].  Seen by Kevin [name] in April 2017, echocardiogram ordered for later this month to evaluate mitral valve  He reports having chronic fatigue, legs are weak, chronic back pain Some sciatic pain down the right thigh He does have tramadol but has not been taking this, rarely will take Tylenol Previous imaging of his back showing scoliosis, DJD  Denies any orthostasis symptoms, Walks around the [place]. Was told to use swimming pool  but has not been doing any exercise. Feels too tired  Lab work reviewed with him BMP nl, tsh nl. On warfarin total chol 196, LDL 124  EKG on today's visit shows paced rhythm rate 65 bpm  Other past medical history Previous symptoms of vertigo, was taking meclizine No chest pain symptoms, no leg edema. Was having lightheadedness, amlodipine decreased down to 5 mg daily.   EKG from [facility] dated 12/20/2014 showing sinus bradycardia rate 49 bpm  Atrial flutter with slow ventricular response rate of 50 seen 06/13/2012 Atrial flutter with slow ventricular response rate 47 seen June 03 2012 Details of the report he had a Medtronic dual-chamber pacemaker [id number] model    echocardiogram may 2014 showing ejection fraction 35-40%, left atrial enlargement. Previous echocardiogram showed ejection fraction 50% He reports that he stopped his Lipitor as it made him feel nervous, he also had sweating.  Previous  CT scan did not show any PE Chest x-ray showed prominent pulmonary arteries otherwise no prominent new findings He had a transesophageal echo 04/30/2012 that was essentially normal documenting normal functioning mitral valve  PMH:   has a past medical history of Atrial fibrillation ([place]); Cancer ([place]); Chronic headaches (started age 44); GERD (gastroesophageal reflux disease); Glaucoma; History of mitral valve replacement with mechanical valve; Hypertension; Osteoporosis; Sciatica; and Sciatica.  PSH:    Past Surgical History:  Procedure Laterality Date  . CARDIOVERSION  2013  . CATARACT EXTRACTION, BILATERAL     x 2  . HERNIA REPAIR     x 2  . MELANOMA EXCISION     right ear  . MITRAL VALVE REPLACEMENT  1994  . NASAL SEPTUM SURGERY  1963  . PERMANENT PACEMAKER INSERTION Left 12/22/2014    Current Outpatient Prescriptions  Medication Sig Dispense Refill  . 5-Hydroxytryptophan (5-HTP PO) Take by mouth daily.    . ALPHA LIPOIC ACID PO Take by mouth daily.    Marland Kitchen amLODipine (NORVASC) 5 MG tablet Take 1 tablet (5 mg total) by mouth daily. (Patient taking differently: Take 5 mg by mouth daily. Takes 1/2 tablet daily) 30 tablet 11  . Ascorbic Acid (  VITAMIN C) 100 MG tablet Take 100 mg by mouth daily.    . brimonidine (ALPHAGAN) 0.15 % ophthalmic solution 2 (two) times daily.     . Calcium-Magnesium-Vitamin D (CALCIUM MAGNESIUM PO) Take by mouth 2 (two) times daily.    . cholecalciferol (VITAMIN D) 1000 units tablet Take 1,000 Units by mouth daily.    Marland Kitchen co-enzyme Q-10 30 MG capsule Take 1 capsule (30 mg total) by mouth daily. 90 capsule 3  . Coenzyme Q10-Levocarnitine (CO  Q-10 PLUS PO) Take 1 capsule by mouth daily.    . DHA-EPA-Vit B6-B12-Folic Acid (CARDIOVID PLUS) CAPS Take 1 capsule by mouth 2 (two) times daily.     . ferrous sulfate 324 (65 FE) MG TBEC Take 1 tablet by mouth every other day.    . gabapentin (NEURONTIN) 100 MG capsule Take 1 capsule (100 mg total) by mouth 3 (three) times daily. 90 capsule 3  . Guggulipid (GUGULIPID PO) Take by mouth daily.    Marland Kitchen ketoconazole (NIZORAL) 2 % cream Apply 1 application topically 2 (two) times daily.     Marland Kitchen KETOPROFEN-KETAMINE-LIDOCAINE EX Apply 1 application topically 2 (two) times daily.    . L-GLUTAMINE PO Take by mouth.      . latanoprost (XALATAN) 0.005 % ophthalmic solution daily.     Marland Kitchen losartan (COZAAR) 100 MG tablet TAKE 1 TABLET BY MOUTH EVERY DAY 90 tablet 1  . LUMIGAN 0.01 % SOLN 1 drop at bedtime.     . metoprolol tartrate (LOPRESSOR) 25 MG tablet TAKE 1 TABLET BY MOUTH TWICE DAILY 60 tablet 0  . Multiple Vitamin (MULTIVITAMIN) tablet Take 1 tablet by mouth daily.      . NON FORMULARY Memory Essentials 2 tablets BID.    Marland Kitchen NON FORMULARY Pro Liver Milk Thistle and Acetyl-Cysteine 1 tablet daily.    . Nutritional Supplements (MELATONIN PO) Take by mouth.    . Omega-3 Fatty Acids (OMEGA 3 PO) Take by mouth.      Loma Boston Calcium 500 MG TABS Take two by mouth daily     . Red Yeast Rice Extract (RED YEAST RICE PO) Take by mouth.      . RUTIN PO Take by mouth.    . SYRINGE-NEEDLE, DISP, 3 ML (SAFETY-LOK 3CC SYR 22GX1.5") 22G X 1-1/2" 3 ML MISC 2 ml's monthly-given 1 shot monthly     . TURMERIC PO Take by mouth.      . warfarin (COUMADIN) 2 MG tablet TAKE 2 TABLETS BY MOUTH ON ALL DAYS, BUT TAKE 1 AND 1/2 TABLETS ON WEDNESDAY 180 tablet 1   No current facility-administered medications for this visit.      Allergies:   Cymbalta [duloxetine hcl] and Aspirin   Social History:  The patient  reports that he has never smoked. He has never used smokeless tobacco. He reports that he does not drink alcohol  or use drugs.   Family History:   family history includes Alzheimer's disease in his brother; Arrhythmia in his brother; Cancer in his father; Mental illness in his mother.    Review of Systems: Review of Systems  Constitutional: Negative.   Respiratory: Negative.   Cardiovascular: Negative.   Gastrointestinal: Negative.   Musculoskeletal: Positive for back pain.       Gait instability  Neurological: Negative.   Psychiatric/Behavioral: Negative.   All other systems reviewed and are negative.    PHYSICAL EXAM: VS:  BP 140/72 (BP Location: Left Arm, Patient Position: Sitting, Cuff Size: Normal)  Pulse 65   Ht 5\' 7"  (1.702 m)   Wt 146 lb 12 oz (66.6 kg)   BMI 22.98 kg/m  , BMI Body mass index is 22.98 kg/m. GEN: Well nourished, well developed, in no acute distress , presents with a cane HEENT: normal  Neck: no JVD, carotid bruits, or masses Cardiac: RRR; no murmurs, rubs, or gallops,no edema  Respiratory:  clear to auscultation bilaterally, normal work of breathing GI: soft, nontender, nondistended, + BS MS: no deformity or atrophy  Skin: warm and dry, no rash Neuro:  Strength and sensation are intact Psych: euthymic mood, full affect    Recent Labs: 07/23/2016: ALT 19; BUN 12; Creatinine, Ser 0.86; Potassium 4.0; Sodium 135; TSH 1.21    Lipid Panel Lab Results  Component Value Date   CHOL 196 01/19/2016   HDL 59 01/19/2016   LDLCALC 124 01/19/2016   TRIG 67 01/19/2016      Wt Readings from Last 3 Encounters:  08/16/16 146 lb 12 oz (66.6 kg)  07/23/16 149 lb (67.6 kg)  01/23/16 150 lb 12.8 oz (68.4 kg)       ASSESSMENT AND PLAN:  Typical atrial flutter (HCC) - Plan: EKG 12-Lead Maintaining normal sinus rhythm, encouraged him to stay on his metoprolol Tolerating warfarin  Benign essential HTN - Plan: EKG 12-Lead Blood pressure is well controlled on today's visit. No changes made to the medications.  Cardiomyopathy ([place]) - Plan: EKG 12-Lead Depressed  ejection fraction in 2014 in the setting of atrial flutter Repeat echocardiogram pending  Generalized muscle weakness Encouraged regular walking, he will try swimming or more regular walking at [facility]  Depression He reports that he is depressed. Unclear if he needs SSRI. Will defer to primary care  Mechanical mitral valve Echocardiogram scheduled over at [facility], Previously placed 1994   Total encounter time more than 25 minutes  Greater than 50% was spent in counseling and coordination of care with the patient  Disposition:   F/U  6 months   Orders Placed This Encounter  Procedures  . EKG 12-Lead     Signed, Esmond Plants, M.D., Ph.D. 08/16/2016  [place], [place]

## 2016-08-16 NOTE — Patient Instructions (Addendum)

## 2016-08-17 ENCOUNTER — Other Ambulatory Visit: Payer: Self-pay | Admitting: Internal Medicine

## 2016-09-11 ENCOUNTER — Telehealth: Payer: Self-pay | Admitting: Internal Medicine

## 2016-09-11 NOTE — Telephone Encounter (Signed)
Patient INR= 2.7 coumadin regimen 4 mg 4 days per week and 3 mg 3 days, please advise results in yellow folder.

## 2016-09-11 NOTE — Telephone Encounter (Signed)
Coumadin level is therapeutic,  Continue current regimen of 4 mg alternating with 3 mg and repeat PT/INR in one month

## 2016-09-11 NOTE — Telephone Encounter (Signed)
Spoke with the patient and reviewed plans with coumadin, verbalized understanding, thanks

## 2016-09-13 ENCOUNTER — Other Ambulatory Visit: Payer: Self-pay | Admitting: Cardiovascular Disease

## 2016-10-02 LAB — PROTIME-INR: INR: 3.5 — AB (ref 0.9–1.1)

## 2016-10-03 ENCOUNTER — Telehealth: Payer: Self-pay | Admitting: Internal Medicine

## 2016-10-03 NOTE — Telephone Encounter (Signed)
Patient INR =3.5 i. coumadin regimen 4 mg 4 days per week and 3 mg 3 days, please advise results in yellow folder

## 2016-10-03 NOTE — Telephone Encounter (Signed)
DECREASE REGIMEN TO 3 MG A TOTAL OF 4 DAYS PER WEEK , AND REDUCE  4 MG  TO JUST 3 DAYS PER WEEK

## 2016-10-04 NOTE — Telephone Encounter (Signed)
Patient notified and voiced understanding.

## 2016-10-30 ENCOUNTER — Other Ambulatory Visit: Payer: Self-pay | Admitting: Internal Medicine

## 2016-10-30 LAB — POCT INR: INR: 3.1 — AB (ref 0.9–1.1)

## 2016-11-03 ENCOUNTER — Telehealth: Payer: Self-pay | Admitting: Internal Medicine

## 2016-11-03 NOTE — Telephone Encounter (Signed)
  Your INR/coumadin level is therapeutic,  Continue current regimen and repeat PT/INR in one month.   

## 2016-11-05 NOTE — Telephone Encounter (Signed)
Patient notified and voiced understanding.

## 2016-11-15 ENCOUNTER — Telehealth: Payer: Self-pay | Admitting: Internal Medicine

## 2016-11-15 MED ORDER — OMEPRAZOLE 40 MG PO CPDR: 40.0000 mg | DELAYED_RELEASE_CAPSULE | Freq: Every day | ORAL | 5 refills | Status: DC

## 2016-11-15 NOTE — Telephone Encounter (Signed)
refilled for 30 days plus 5 refills

## 2016-11-15 NOTE — Telephone Encounter (Signed)
Last seen patient on 07/23/16, you have not prescribed this medication prior, please advise. thanks

## 2016-11-15 NOTE — Telephone Encounter (Signed)
Pt called and stated that Dr. Jerline Pain closed his practice and pt needs to get a refill on his omeprazole 40 mg 1x daily. Please advise, thank you!  Call pt @ [facility], [place]

## 2016-11-22 ENCOUNTER — Other Ambulatory Visit: Payer: Self-pay | Admitting: Internal Medicine

## 2016-11-23 ENCOUNTER — Telehealth: Payer: Self-pay | Admitting: Internal Medicine

## 2016-11-23 NOTE — Telephone Encounter (Signed)
Coumadin level (INR) is low at  2.4 (goal is 2.5 to 3.5 )   Please increase dose to 4 mg  Every day and repeat INR in one week .  Holiday food will likely drop his level even more.

## 2016-11-23 NOTE — Telephone Encounter (Signed)
Patient INR = 2.4 current regimen is 4 mg 4 days per week and 3 mg 3 days per week.

## 2016-11-23 NOTE — Telephone Encounter (Signed)
Patient notified and voiced understanding.

## 2016-11-28 LAB — POCT INR: INR: 3 — AB (ref <=1.1)

## 2016-11-29 ENCOUNTER — Telehealth: Payer: Self-pay | Admitting: Internal Medicine

## 2016-11-29 NOTE — Telephone Encounter (Signed)
Left detailed message with Instructions to call back with any questions.

## 2016-11-29 NOTE — Telephone Encounter (Signed)
INR + 3.0  @ 4 mg coumadin daily.

## 2016-11-29 NOTE — Telephone Encounter (Signed)
Coumadin level is therapeutic,  Continue current regimen and repeat PT/INR in one month 

## 2016-11-29 NOTE — Telephone Encounter (Signed)
Spoke to patient I explained to the patient that he should continue taking his coumadin that he is suppose to take at 4 mg .  Will call next week to speak with Juliann Pulse

## 2016-12-14 ENCOUNTER — Telehealth: Payer: Self-pay | Admitting: Internal Medicine

## 2016-12-14 NOTE — Telephone Encounter (Signed)
Patient INR =3.6  Current coumadin dose 4 mg daily

## 2016-12-14 NOTE — Telephone Encounter (Signed)
INR is slightly high,  Skip tonight's dose,  Resume schedule tomorrow . Recheck in 2 weeks   Has he started any new supplements?

## 2016-12-17 NOTE — Telephone Encounter (Signed)
Spoke with patient advised of below .  He states he has only been taking over the counter tylenol denies any changes in diet.  Patient verbalized an understanding.

## 2017-01-03 ENCOUNTER — Telehealth: Payer: Self-pay | Admitting: Internal Medicine

## 2017-01-03 NOTE — Telephone Encounter (Signed)
Coumadin level (INR) is high at 3.9 (goal is 2.5 to 3. 5)   Please confirm current  regimen so I can lower his weekly  dose

## 2017-01-04 NOTE — Telephone Encounter (Signed)
rediuce dose to 2 mg on Friday and mondays,  4 mg all other days  repeat INR  In 2 weeks

## 2017-01-04 NOTE — Telephone Encounter (Signed)
Patient takes 4mg  daily per patient

## 2017-01-07 NOTE — Telephone Encounter (Signed)
Patient notified

## 2017-01-10 ENCOUNTER — Other Ambulatory Visit: Payer: Self-pay | Admitting: Internal Medicine

## 2017-01-15 ENCOUNTER — Encounter: Payer: Self-pay | Admitting: Internal Medicine

## 2017-01-15 LAB — PROTIME-INR: INR: 3.6 — AB (ref 0.9–1.1)

## 2017-01-18 ENCOUNTER — Telehealth: Payer: Self-pay | Admitting: Internal Medicine

## 2017-01-18 NOTE — Telephone Encounter (Signed)
Left mess for patient to call back.  

## 2017-01-18 NOTE — Telephone Encounter (Signed)
  Your INR/coumadin level is therapeutic,  Continue current regimen and repeat PT/INR in one month.   

## 2017-01-18 NOTE — Telephone Encounter (Signed)
Pt informed

## 2017-01-21 ENCOUNTER — Ambulatory Visit (INDEPENDENT_AMBULATORY_CARE_PROVIDER_SITE_OTHER): Payer: Medicare Other | Admitting: Internal Medicine

## 2017-01-21 ENCOUNTER — Encounter: Payer: Self-pay | Admitting: Internal Medicine

## 2017-01-21 VITALS — BP 120/62 | HR 66 | Resp 16 | Wt 148.0 lb

## 2017-01-21 DIAGNOSIS — R0609 Other forms of dyspnea: Secondary | ICD-10-CM

## 2017-01-21 DIAGNOSIS — I429 Cardiomyopathy, unspecified: Secondary | ICD-10-CM

## 2017-01-21 DIAGNOSIS — M6281 Muscle weakness (generalized): Secondary | ICD-10-CM | POA: Diagnosis not present

## 2017-01-21 DIAGNOSIS — F330 Major depressive disorder, recurrent, mild: Secondary | ICD-10-CM

## 2017-01-21 DIAGNOSIS — E7800 Pure hypercholesterolemia, unspecified: Secondary | ICD-10-CM

## 2017-01-21 DIAGNOSIS — M5431 Sciatica, right side: Secondary | ICD-10-CM

## 2017-01-21 DIAGNOSIS — G5793 Unspecified mononeuropathy of bilateral lower limbs: Secondary | ICD-10-CM

## 2017-01-21 LAB — TSH: TSH: 1.56 u[IU]/mL (ref 0.35–4.50)

## 2017-01-21 LAB — COMPREHENSIVE METABOLIC PANEL
ALBUMIN: 4.4 g/dL (ref 3.5–5.2)
ALK PHOS: 56 U/L (ref 39–117)
ALT: 20 U/L (ref 0–53)
AST: 31 U/L (ref 0–37)
BILIRUBIN TOTAL: 0.7 mg/dL (ref 0.2–1.2)
BUN: 18 mg/dL (ref 6–23)
CALCIUM: 9.5 mg/dL (ref 8.4–10.5)
CO2: 30 mEq/L (ref 19–32)
Chloride: 98 mEq/L (ref 96–112)
Creatinine, Ser: 0.92 mg/dL (ref 0.40–1.50)
GFR: 82.22 mL/min (ref >60.00)
Glucose, Bld: 80 mg/dL (ref 70–99)
Potassium: 4.3 mEq/L (ref 3.5–5.1)
Sodium: 133 mEq/L — ABNORMAL LOW (ref 135–145)
TOTAL PROTEIN: 6.6 g/dL (ref 6.0–8.3)

## 2017-01-21 LAB — CBC WITH DIFFERENTIAL/PLATELET
BASOS ABS: 0 10*3/uL (ref 0.0–0.1)
Basophils Relative: 1 % (ref 0.0–3.0)
Eosinophils Absolute: 0.2 10*3/uL (ref 0.0–0.7)
Eosinophils Relative: 4.9 % (ref 0.0–5.0)
HEMATOCRIT: 34.2 % — AB (ref 39.0–52.0)
Hemoglobin: 11.7 g/dL — ABNORMAL LOW (ref 13.0–17.0)
LYMPHS PCT: 17.3 % (ref 12.0–46.0)
Lymphs Abs: 0.8 10*3/uL (ref 0.7–4.0)
MCHC: 34.1 g/dL (ref 30.0–36.0)
MCV: 98.1 fl (ref 78.0–100.0)
Monocytes Absolute: 0.7 10*3/uL (ref 0.1–1.0)
Monocytes Relative: 14.6 % — ABNORMAL HIGH (ref 3.0–12.0)
NEUTROS PCT: 62.2 % (ref 43.0–77.0)
Neutro Abs: 2.8 10*3/uL (ref 1.4–7.7)
Platelets: 212 10*3/uL (ref 150.0–400.0)
RBC: 3.49 Mil/uL — AB (ref 4.22–5.81)
RDW: 13.1 % (ref 11.5–15.5)
WBC: 4.6 10*3/uL (ref 4.0–10.5)

## 2017-01-21 LAB — CK: Total CK: 135 U/L (ref 7–232)

## 2017-01-21 LAB — BRAIN NATRIURETIC PEPTIDE: PRO B NATRI PEPTIDE: 139 pg/mL — AB (ref 0.0–100.0)

## 2017-01-21 NOTE — Progress Notes (Signed)
Subjective:  Patient ID: Kevin [name], male    DOB: 1925/06/15  Age: 83 y.o. MRN: [id number]  CC: The primary encounter diagnosis was Muscle weakness (generalized). Diagnoses of Dyspnea on exertion, Exertional dyspnea, Pure hypercholesterolemia, Mild episode of recurrent major depressive disorder ([place]), Sciatica of right side, Cardiomyopathy, unspecified type ([place]), and Neuropathy involving both lower extremities were also pertinent to this visit.  HPI WIILIAM [name] presents for follow up on chronic issues.  He was last seen august 2017.  He appears more frail than his last visit and states that he is not feeling well in general, for the following reasons:  1) persistent right leg pain .  Not walking recreationally any more. Uses a rolling walker with seat to get to dining hall. No recent falls,  But feels generally weak. Not seeing [place] as often. Using capsaicin cream daily ,  Takes tylenol  3-4 times per week , has been avoiding daily use due to concern about rebound headaches and overuse. Reviewed prior comments and advise in relation to overuse of fioricet for chronic daily headaches.    2) cardiomyopathy: limits walks to 3 minutes due to dyspnea and leg pain, .  LIVES IN [place] [address] AT [facility]. Denies chest pain.  Last assessment for ischemia and chg was a NORMAL STRESS TEST , AND normal EF IN 2014.  He is s/p PACERMAKER JAN 2016   Sees [facility] this month   DISCUSSED CARDIOPULMOARY REHAB      Outpatient Medications Prior to Visit  Medication Sig Dispense Refill  . 5-Hydroxytryptophan (5-HTP PO) Take by mouth daily.    . ALPHA LIPOIC ACID PO Take by mouth daily.    Marland Kitchen amLODipine (NORVASC) 5 MG tablet Take 1 tablet (5 mg total) by mouth daily. 30 tablet 5  . Ascorbic Acid (VITAMIN C) 100 MG tablet Take 100 mg by mouth daily.    . brimonidine (ALPHAGAN) 0.15 % ophthalmic solution 2 (two) times daily.     . Calcium-Magnesium-Vitamin D (CALCIUM MAGNESIUM PO) Take by mouth  2 (two) times daily.    . cholecalciferol (VITAMIN D) 1000 units tablet Take 1,000 Units by mouth daily.    Marland Kitchen co-enzyme Q-10 30 MG capsule Take 1 capsule (30 mg total) by mouth daily. 90 capsule 3  . Coenzyme Q10-Levocarnitine (CO Q-10 PLUS PO) Take 1 capsule by mouth daily.    Marland Kitchen latanoprost (XALATAN) 0.005 % ophthalmic solution daily.     Marland Kitchen losartan (COZAAR) 100 MG tablet TAKE 1 TABLET BY MOUTH EVERY DAY 90 tablet 1  . losartan (COZAAR) 100 MG tablet TAKE 1 TABLET BY MOUTH EVERY DAY 90 tablet 2  . metoprolol tartrate (LOPRESSOR) 25 MG tablet TAKE 1 TABLET BY MOUTH TWICE DAILY 60 tablet 6  . Multiple Vitamin (MULTIVITAMIN) tablet Take 1 tablet by mouth daily.      . NON FORMULARY Memory Essentials 2 tablets BID.    Marland Kitchen NON FORMULARY Pro Liver Milk Thistle and Acetyl-Cysteine 1 tablet daily.    . Nutritional Supplements (MELATONIN PO) Take by mouth.    Marland Kitchen omeprazole (PRILOSEC) 40 MG capsule Take 1 capsule (40 mg total) by mouth daily. 30 capsule 5  . Oyster Shell Calcium 500 MG TABS Take two by mouth daily     . Red Yeast Rice Extract (RED YEAST RICE PO) Take by mouth.      . RUTIN PO Take by mouth.    . TURMERIC PO Take by mouth.      Marland Kitchen  warfarin (COUMADIN) 2 MG tablet TAKE 2 TABLETS BY MOUTH ON ALL DAYS, BUT TAKE 1 AND 1/2 TABLETS ON WEDNESDAY 180 tablet 1  . warfarin (COUMADIN) 3 MG tablet TAKE 1 TABLET BY MOUTH ON TUESDAY, THURSDAY, SATURDAY, AND SUNDAY 51 tablet 1  . DHA-EPA-Vit B6-B12-Folic Acid (CARDIOVID PLUS) CAPS Take 1 capsule by mouth 2 (two) times daily.     . ferrous sulfate 324 (65 FE) MG TBEC Take 1 tablet by mouth every other day.    . gabapentin (NEURONTIN) 100 MG capsule Take 1 capsule (100 mg total) by mouth 3 (three) times daily. (Patient not taking: Reported on 01/21/2017) 90 capsule 3  . Guggulipid (GUGULIPID PO) Take by mouth daily.    Marland Kitchen ketoconazole (NIZORAL) 2 % cream Apply 1 application topically 2 (two) times daily.     Marland Kitchen KETOPROFEN-KETAMINE-LIDOCAINE EX Apply 1  application topically 2 (two) times daily.    . L-GLUTAMINE PO Take by mouth.      . LUMIGAN 0.01 % SOLN 1 drop at bedtime.     . Omega-3 Fatty Acids (OMEGA 3 PO) Take by mouth.      . SYRINGE-NEEDLE, DISP, 3 ML (SAFETY-LOK 3CC SYR 22GX1.5") 22G X 1-1/2" 3 ML MISC 2 ml's monthly-given 1 shot monthly      No facility-administered medications prior to visit.     Review of Systems;  Patient denies headache, fevers, malaise, unintentional weight loss, skin rash, eye pain, sinus congestion and sinus pain, sore throat, dysphagia,  hemoptysis , cough, dyspnea, wheezing, chest pain, palpitations, orthopnea, edema, abdominal pain, nausea, melena, diarrhea, constipation, flank pain, dysuria, hematuria, urinary  Frequency, nocturia, numbness, tingling, seizures,  Focal weakness, Loss of consciousness,  Tremor, insomnia, depression, anxiety, and suicidal ideation.      Objective:  BP 120/62   Pulse 66   Resp 16   Wt 148 lb (67.1 kg)   SpO2 94%   BMI 23.18 kg/m   BP Readings from Last 3 Encounters:  01/21/17 120/62  08/16/16 140/72  07/23/16 112/62    Wt Readings from Last 3 Encounters:  01/21/17 148 lb (67.1 kg)  08/16/16 146 lb 12 oz (66.6 kg)  07/23/16 149 lb (67.6 kg)    General appearance: alert, cooperative and appears stated age Ears: normal TM's and external ear canals both ears Throat: lips, mucosa, and tongue normal; teeth and gums normal Neck: no adenopathy, no carotid bruit, supple, symmetrical, trachea midline and thyroid not enlarged, symmetric, no tenderness/mass/nodules Back: symmetric, no curvature. ROM normal. No CVA tenderness. Lungs: clear to auscultation bilaterally Heart: regular rate and rhythm, S1, S2 normal, no murmur, click, rub or gallop Abdomen: soft, non-tender; bowel sounds normal; no masses,  no organomegaly Pulses: 2+ and symmetric Skin: Skin color, texture, turgor normal. No rashes or lesions Lymph nodes: Cervical, supraclavicular, and axillary nodes  normal.  No results found for: HGBA1C  Lab Results  Component Value Date   CREATININE 0.92 01/21/2017   CREATININE 0.86 07/23/2016   CREATININE 0.9 01/19/2016    Lab Results  Component Value Date   WBC 4.6 01/21/2017   HGB 11.7 (L) 01/21/2017   HCT 34.2 (L) 01/21/2017   PLT 212.0 01/21/2017   GLUCOSE 80 01/21/2017   CHOL 196 01/19/2016   TRIG 67 01/19/2016   HDL 59 01/19/2016   LDLDIRECT 105.9 03/13/2012   LDLCALC 124 01/19/2016   ALT 20 01/21/2017   AST 31 01/21/2017   NA 133 (L) 01/21/2017   K 4.3 01/21/2017   CL 98  01/21/2017   CREATININE 0.92 01/21/2017   BUN 18 01/21/2017   CO2 30 01/21/2017   TSH 1.56 01/21/2017   INR 3.0 (A) 11/28/2016    Assessment & Plan:   Problem List Items Addressed This Visit    Cardiomyopathy ([place])    Last known ef 35 to 40% by 2014 ECHO.   No signs of failure on exam,  But Needs reassessment given worsening exertional dyspnea. bnp is reassuring today at 139      Exertional dyspnea    Secondary to deconditioning and muscle weakness  resulting from chronic leg pain , severe scoliosis .  Cardiopulmonary rehab/PT offered and ordered .  He has follow up with cardiology this month,  Last ef assessment per chart:  echocardiogram may 2014 showing ejection fraction 35-40%, left atrial enlargement. Previous echocardiogram showed ejection fraction 50%      Hyperlipidemia    He has a history of intolerance to Lipitor as it made him feel nervous, he also had sweating.  Lab Results  Component Value Date   CHOL 196 01/19/2016   HDL 59 01/19/2016   LDLCALC 124 01/19/2016   LDLDIRECT 105.9 03/13/2012   TRIG 67 01/19/2016   Given his age there is no good reason to resume statin therapy.        Major depressive disorder, recurrent episode ([place])    Aggravated by chronic pain and decreased mobility. He did not tolerate Cymbalta .  Will address his pain with daily use of tylenol( 2000 mg daily max dose outlined) and reassess need for  additional therapy with ssri trial       Neuropathy of lower extremity     No signs of reversible causes,  May be related to spinal stenosis Did not tolerate elavil or gabapentin.  Marland Kitchenlassttsh Lab Results  Component Value Date   [id number] 08/22/2011         Sciatica of right side    Did not tolerate  cymbalta for chronic pain . Advised to [place] daily scheduled doses of tylenol . Max dose 2000 mg,  And reassess for additional pain control.  Not a surgical candidate          Other Visit Diagnoses    Muscle weakness (generalized)    -  Primary   Relevant Orders   Comprehensive metabolic panel (Completed)   CK (Creatine Kinase) (Completed)   TSH (Completed)   Dyspnea on exertion       Relevant Orders   CBC with Differential/Platelet (Completed)   B Nat Peptide (Completed)   Ambulatory referral to Physical Therapy      I am having Mr. Bashir maintain his SYRINGE-NEEDLE (DISP) 3 ML, Oyster Shell Calcium, L-GLUTAMINE PO, Omega-3 Fatty Acids (OMEGA 3 PO), Red Yeast Rice Extract (RED YEAST RICE PO), TURMERIC PO, multivitamin, brimonidine, latanoprost, Nutritional Supplements (MELATONIN PO), DHA-EPA-Vit B6-B12-Folic Acid, LUMIGAN, NON FORMULARY, NON FORMULARY, 5-Hydroxytryptophan (5-HTP PO), Guggulipid (GUGULIPID PO), Calcium-Magnesium-Vitamin D (CALCIUM MAGNESIUM PO), ALPHA LIPOIC ACID PO, vitamin C, RUTIN PO, ketoconazole, ferrous sulfate, KETOPROFEN-KETAMINE-LIDOCAINE EX, gabapentin, co-enzyme Q-10, Coenzyme Q10-Levocarnitine (CO Q-10 PLUS PO), warfarin, losartan, cholecalciferol, amLODipine, metoprolol tartrate, omeprazole, warfarin, losartan, and fluocinonide cream.  Meds ordered this encounter  Medications  . fluocinonide cream (LIDEX) 0.05 %    Sig: Apply 1 application topically 2 (two) times daily.    Refill:  0    There are no discontinued medications.  Follow-up: Return in about 4 months (around 05/21/2017).   Crecencio Mc, MD

## 2017-01-21 NOTE — Patient Instructions (Addendum)
You can take up to 2000 mg of acetominophen every day for your hip pain   acetominophen will not harm your kidneys  DO NOT TAKE MOTRIN, ALEVE OR ADVIL  Because use of coumadin concurrently can increase your risk for a GI bleed   I am referring you for physical therapy at the hospital with the respiratory therapist

## 2017-01-21 NOTE — Progress Notes (Signed)
Pre visit review using our clinic review tool, if applicable. No additional management support is needed unless otherwise documented below in the visit note. 

## 2017-01-22 ENCOUNTER — Encounter: Payer: Self-pay | Admitting: Internal Medicine

## 2017-01-22 NOTE — Assessment & Plan Note (Signed)
He has a history of intolerance to Lipitor as it made him feel nervous, he also had sweating.  Lab Results  Component Value Date   CHOL 196 01/19/2016   HDL 59 01/19/2016   LDLCALC 124 01/19/2016   LDLDIRECT 105.9 03/13/2012   TRIG 67 01/19/2016   Given his age there is no good reason to resume statin therapy.

## 2017-01-22 NOTE — Assessment & Plan Note (Signed)
Aggravated by chronic pain and decreased mobility. He did not tolerate Cymbalta .  Will address his pain with daily use of tylenol( 2000 mg daily max dose outlined) and reassess need for additional therapy with ssri trial

## 2017-01-22 NOTE — Assessment & Plan Note (Addendum)
No signs of reversible causes,  May be related to spinal stenosis Did not tolerate elavil or gabapentin.  Marland Kitchenlassttsh Lab Results  Component Value Date   [id number] 08/22/2011

## 2017-01-22 NOTE — Assessment & Plan Note (Addendum)
Last known ef 35 to 40% by 2014 ECHO.   No signs of failure on exam,  But Needs reassessment given worsening exertional dyspnea. bnp is reassuring today at 139

## 2017-01-22 NOTE — Assessment & Plan Note (Addendum)
Secondary to deconditioning and muscle weakness  resulting from chronic leg pain , severe scoliosis .  Cardiopulmonary rehab/PT offered and ordered .  He has follow up with cardiology this month,  Last ef assessment per chart:  echocardiogram may 2014 showing ejection fraction 35-40%, left atrial enlargement. Previous echocardiogram showed ejection fraction 50%

## 2017-01-22 NOTE — Assessment & Plan Note (Signed)
Did not tolerate  cymbalta for chronic pain . Advised to [place] daily scheduled doses of tylenol . Max dose 2000 mg,  And reassess for additional pain control.  Not a surgical candidate

## 2017-01-23 ENCOUNTER — Ambulatory Visit: Payer: Medicare Other | Admitting: Internal Medicine

## 2017-01-29 ENCOUNTER — Other Ambulatory Visit: Payer: Self-pay | Admitting: *Deleted

## 2017-01-30 ENCOUNTER — Telehealth: Payer: Self-pay | Admitting: Internal Medicine

## 2017-01-30 DIAGNOSIS — R531 Weakness: Secondary | ICD-10-CM

## 2017-01-30 NOTE — Telephone Encounter (Signed)
PT referral ordered

## 2017-01-30 NOTE — Telephone Encounter (Signed)
-----   Message from Lynford Humphrey, RN sent at 01/29/2017  2:58 PM EST ----- Regarding: referral you sent in [facility] Derrel Nip, [facility]  PT will be sending you a referral form for Mr. Stiggers. He should have evaluation and treatment with PT for the weakness before he enrolls in another program.  Thank you, Heath Lark RN [facility] Cardiac [facility] Pul[facility]

## 2017-01-30 NOTE — Telephone Encounter (Signed)
Please ask mr Kevin [name] if he is willing to have an evaluation by Physical therapy at [facility] . It is required before I can enroll him in the cardiopulmonary rehab program.

## 2017-01-30 NOTE — Telephone Encounter (Signed)
Spoke with patient he is willing to having evaluation for Physical Therapy at [facility].  We will need to let Gordon, [facility] Cardiac Pulmonary Rehab know.

## 2017-01-31 NOTE — Telephone Encounter (Signed)
Faxed order paper order for physical to hospital at [phone] .

## 2017-02-14 ENCOUNTER — Telehealth: Payer: Self-pay | Admitting: Internal Medicine

## 2017-02-14 NOTE — Telephone Encounter (Signed)
Coumadin level (INR) is high at  4.0 (goal is 2.5 to 3.5 )   Please confirm current  Regimen is 4 mg alternating with 4 mg  so I can adjust dose .  He should take 2 mg tonight until we can get back to him.

## 2017-02-15 NOTE — Telephone Encounter (Signed)
Left detailed message with Kathrynn Speed at [facility]

## 2017-02-15 NOTE — Telephone Encounter (Signed)
Patient is taking 4 mg coumadin daily, patient was not aware to take only 2 mg last night. Please send message back to me.Juliann Pulse)

## 2017-02-15 NOTE — Telephone Encounter (Signed)
Have him take 2 mg tonight.  Starting on Saturday take 4 mg alternating with 3 mg and recheck in 2 weeks

## 2017-02-20 ENCOUNTER — Ambulatory Visit: Payer: Medicare Other | Attending: Internal Medicine

## 2017-02-20 VITALS — BP 127/50 | HR 61

## 2017-02-20 DIAGNOSIS — R262 Difficulty in walking, not elsewhere classified: Secondary | ICD-10-CM | POA: Diagnosis present

## 2017-02-20 DIAGNOSIS — Z9181 History of falling: Secondary | ICD-10-CM | POA: Insufficient documentation

## 2017-02-20 DIAGNOSIS — M79652 Pain in left thigh: Secondary | ICD-10-CM | POA: Insufficient documentation

## 2017-02-20 DIAGNOSIS — M6281 Muscle weakness (generalized): Secondary | ICD-10-CM | POA: Diagnosis present

## 2017-02-20 DIAGNOSIS — M79651 Pain in right thigh: Secondary | ICD-10-CM | POA: Diagnosis present

## 2017-02-20 NOTE — Therapy (Signed)
[facility] [address] [place], [address] Phone: [phone]   Fax:  [phone]  Physical Therapy Evaluation  Patient Details  Name: Kevin [name] MRN: [id number] Date of Birth: October 25, 1925 Referring Provider: Deborra Medina, MD  Encounter Date: 02/20/2017      PT End of Session - 02/20/17 0855    Visit Number 1   Number of Visits 13   Date for PT Re-Evaluation 04/04/17   Authorization Type 1   Authorization Time Period of 10   PT Start Time [phone]   PT Stop Time 1023   PT Time Calculation (min) 92 min   Activity Tolerance Patient tolerated treatment well   Behavior During Therapy [facility] for tasks assessed/performed      Past Medical History:  Diagnosis Date  . Atrial fibrillation ([place])   . Cancer ([place])    melanoma- right ear  . Chronic headaches started age 52  . GERD (gastroesophageal reflux disease)   . Glaucoma   . History of mitral valve replacement with mechanical valve   . Hypertension   . Osteoporosis    secondary to low testoerone  . Sciatica   . Sciatica     Past Surgical History:  Procedure Laterality Date  . CARDIOVERSION  2013  . CATARACT EXTRACTION, BILATERAL     x 2  . HERNIA REPAIR     x 2  . MELANOMA EXCISION     right ear  . MITRAL VALVE REPLACEMENT  1994  . NASAL SEPTUM SURGERY  1963  . PERMANENT PACEMAKER INSERTION Left 12/22/2014    Vitals:   02/20/17 0905  BP: (!) 127/50  Pulse: 61         Subjective Assessment - 02/20/17 0905    Subjective R lateral thigh pain: 4/10 currently, 10/10 at worst (no reason that he is aware of)   Pertinent History Pt states having R lateral thigh pain which is his main trouble which comes from scoliosis, irriating his nerve coming out of the 5th vertebra. Has been having pain since 4 years ago, gradual onset. Goes to an osteopath in [place], was referred to PT. Had PT for 2 years in [place] until his therapist gave up.  Treatments included stretching his R side, bridging, posterior pelvic tilting with deep breaths and exhaling, supine trunk rotation. Pt was told that the exercises will not help him but might keep him from deteriorating further. Feels like his condition is worse currently. Does his HEP 2x/day regularly.  Has been using a SPC for 2 years.   Patient Stated Goals I'd like to walk without a cane or walker, without pain, have more endurance.    Currently in Pain? Yes   Pain Score 4    Pain Location --  R lateral thigh   Pain Orientation Right   Pain Descriptors / Indicators Burning;Aching   Pain Type Chronic pain   Pain Onset More than a month ago   Pain Frequency Constant   Aggravating Factors  Standing and walking,    Pain Relieving Factors Heating pad, hot shower, acetomenophen (when desperate, took one last night), sitting            [facility] PT Assessment - 02/20/17 0855      Assessment   Medical Diagnosis Generalized weakness   Referring Provider Deborra Medina, MD   Onset Date/Surgical Date 01/30/17  Date PT referral signed. Chronic condition x 4 years   Prior Therapy Pt  had prior PT which involved stretching his R side, bridging, posterior pelvic tilting with deep breaths and exhaling, as well as supine lower trunk rotation.      Precautions   Precaution Comments pacemaker, osteoporosis, fall risk     Restrictions   Other Position/Activity Restrictions No e-stim due to pacemaker placement 12/22/2014     Balance Screen   Has the patient fallen in the past 6 months Yes  Osteopath in [place] knows about his fall   How many times? 1  3 weeks ago; pt R foot got tangled up on a lamp cord.   Has the patient had a decrease in activity level because of a fear of falling?  Yes   Is the patient reluctant to leave their home because of a fear of falling?  Yes     Home Environment   Additional Comments Patient lives in an apartment alone. No steps, no rails     Prior Function    Vocation Retired  former music professor at [employer] PLOF: less difficulty standing and walking   Leisure read, watch TV     Observation/Other Assessments   Observations No R lateral thigh symptom reproduction with R hip quadrant movement or scour testing, SLR R LE did not reproduce symptoms. Negative Ober's test (knee flexed and straight), No TTP R greater trochanter. Reproduction of R lateral thigh symptoms with palpation to R lateral distal femur (R lateral hamstring area).    Lower Extremity Functional Scale  24/80     Posture/Postural Control   Posture Comments R lateral lean, R lateral shift, L trunk rotation, R weight shift, R iliac crest and greater trochanter higher     AROM   Overall AROM Comments Hip IR at 90/90 PROM: L 24 degrees, R 17 degrees   Lumbar Flexion Limited   Lumbar Extension limited   Lumbar - Right Side Bend limited   Lumbar - Left Side Bend limited   Lumbar - Right Rotation limited   Lumbar - Left Rotation limited     Strength   Right Hip Flexion 4/5   Right Hip ABduction 4+/5   Left Hip Flexion 4/5   Left Hip ABduction 4/5   Right Knee Flexion 4+/5   Right Knee Extension 4+/5   Left Knee Flexion 4/5   Left Knee Extension 5/5     Ambulation/Gait   Gait Comments ambulates with SPC on R side, R lateral lean posture, decreased stance R LE, shuffling.  Able to ambulate 222 ft with SPC on R from front entrance to car, SBA to [place] to Stand Able to stand  independently using hands   Standing Unsupported Able to stand safely 2 minutes   Sitting with Back Unsupported but Feet Supported on Floor or Stool Able to sit safely and securely 2 minutes   Stand to Sit Controls descent by using hands   Transfers Able to transfer safely, definite need of hands   Standing Unsupported with Eyes Closed Able to stand 10 seconds with supervision   Standing Ubsupported with Feet Together Needs help to attain position  but able to stand for 30 seconds with feet together   From Standing, Reach Forward with Outstretched Arm Can reach forward >5 cm safely (2")   From Standing Position, Pick up Object from Floor Able to pick up shoe, needs supervision   Turn 360 Degrees Needs assistance while turning  Able to  turn slowly. Dizziness at end, needing assist.      No R lateral thigh pain after supine and S/L activities.  SpO2 within normal limits  Performed 360 degree turn to the R side during Berg Balance test. Pt able to perform in 11 seconds, slowly and safely but felt dizziness at the end and needed PT assist to maintain balance. Dizziness decreased with rest but started getting a headache. Pt states that the headache is normal, gets it 2-3x/week. Headache worsened at first but eased with rest  Blood pressure 155/69, HR 63, L arm sitting, mechanically taken                       PT Education - 02/20/17 2027    Education provided Yes   Education Details plan of care   Person(s) Educated Patient   Methods Explanation   Comprehension Verbalized understanding             PT Long Term Goals - 02/20/17 1301      PT LONG TERM GOAL #1   Title Patient will be able to ambulate at least 350 ft with SPC on R without LOB and mod I to promote mobility.    Baseline Pt able to ambulate 222 ft with SPC on R, CGA to SBA (02/20/2017)   Time 6   Period Weeks   Status New     PT LONG TERM GOAL #2   Title Patient will improve LEFS score by at least 9 points as a demonstration of improved function.    Baseline 24/80 (02/20/2017)   Time 6   Period Weeks   Status New     PT LONG TERM GOAL #3   Title Patient will have a decrease in R lateral thigh pain to 5/10 or less at worst to promote ability to stand and walk.    Baseline 10/10 R lateral hip pain at worst (02/20/2017)   Time 6   Period Weeks   Status New     PT LONG TERM GOAL #4   Title Patient will be able to turn 360 degrees safely and  independently without LOB to promote ability to perform standing tasks.    Baseline Able to turn 360 degrees to the R but felt dizziness at the end and needed assist to maintain balance (02/20/2017)   Time 6   Period Weeks   Status New               Plan - 02/20/17 1240    Clinical Impression Statement Patient is an 83 year old male who came to physical therapy secondary to R lateral thigh pain and weakness. He also presents with altered gait pattern and posture, limited lumbar mobility, difficulty with standing balance, dizziness when turning, TTP R lateral thigh, and difficulty performing functional tasks such as walking longer distances and tolerating positions such as standing. Patient will benefit from skilled physical therapy services to address the aforementioned deficits.    Rehab Potential Fair   Clinical Impairments Affecting Rehab Potential Chronicity of condition, age   PT Frequency 2x / week   PT Duration 6 weeks   PT Treatment/Interventions Therapeutic activities;Therapeutic exercise;Neuromuscular re-education;Patient/family education;Manual techniques;Dry needling;Gait training;Stair training;Functional mobility training;Aquatic Therapy   PT Next Visit Plan posture, scapular, hip strengthening, gait, balance   Consulted and Agree with Plan of Care Patient      Patient will benefit from skilled therapeutic intervention in order to improve the following deficits and impairments:  Pain, Postural dysfunction, Improper body mechanics, Decreased strength, Difficulty walking, Decreased range of motion, Decreased endurance, Decreased balance  Visit Diagnosis: Pain in right thigh - Plan: PT plan of care cert/re-cert  Difficulty in walking, not elsewhere classified - Plan: PT plan of care cert/re-cert  Muscle weakness (generalized) - Plan: PT plan of care cert/re-cert  History of falling - Plan: PT plan of care cert/re-cert      G-Codes - 94/17/40 03-10-2031    Functional  Assessment Tool Used (Outpatient Only) LEFS, clinical presentation, patient interview   Functional Limitation Mobility: Walking and moving around   Mobility: Walking and Moving Around Current Status [phone]) At least 60 percent but less than 80 percent impaired, limited or restricted   Mobility: Walking and Moving Around Goal Status [phone]) At least 40 percent but less than 60 percent impaired, limited or restricted       Problem List Patient Active Problem List   Diagnosis Date Noted  . Generalized muscle weakness 07/24/2016  . Major depressive disorder, recurrent episode ([place]) 01/24/2016  . Vitamin D deficiency 01/24/2016  . Fatigue 01/23/2016  . Benign essential HTN 03/04/2015  . H/O prosthetic heart valve 03/04/2015  . Symptomatic bradycardia 02/21/2015  . Episodic lightheadedness 02/21/2015  . Exertional dyspnea 06/25/2014  . Cardiomyopathy ([place]) 03/09/2014  . Second degree AV block, Mobitz type I 11/20/2013  . Secondary cardiomyopathy ([place]) 11/18/2013  . Hyperlipidemia 04/27/2013  . Sciatica of right side 03/16/2013  . Other and unspecified hyperlipidemia 03/16/2013  . Anemia, iron deficiency 09/15/2012  . Osteoporosis, senile 09/15/2012  . Atrial flutter ([place]) 06/26/2012  . Depression 12/24/2011  . Hypertension 08/25/2011  . Neuropathy of lower extremity 08/25/2011  . Bradycardia 08/25/2011  . Atrial fibrillation ([place])   . History of mitral valve replacement with mechanical valve   . Long term current use of anticoagulant therapy 08/22/2011    Joneen Boers PT, DPT   02/20/2017, 8:39 PM  [facility] [address], [place], [address] Phone: [phone]   Fax:  [phone]  Name: Kevin [name] MRN: [id number] Date of Birth: 12-14-1924

## 2017-02-25 ENCOUNTER — Ambulatory Visit: Payer: Medicare Other

## 2017-02-27 ENCOUNTER — Ambulatory Visit: Payer: Medicare Other

## 2017-02-28 ENCOUNTER — Ambulatory Visit: Payer: Medicare Other

## 2017-02-28 DIAGNOSIS — M79651 Pain in right thigh: Secondary | ICD-10-CM | POA: Diagnosis not present

## 2017-02-28 DIAGNOSIS — M6281 Muscle weakness (generalized): Secondary | ICD-10-CM

## 2017-02-28 DIAGNOSIS — Z9181 History of falling: Secondary | ICD-10-CM

## 2017-02-28 DIAGNOSIS — R262 Difficulty in walking, not elsewhere classified: Secondary | ICD-10-CM

## 2017-02-28 NOTE — Therapy (Signed)
[facility] [address] [place], [address] Phone: [phone]   Fax:  [phone]  Physical Therapy Treatment  Patient Details  Name: Kevin [name] MRN: [id number] Date of Birth: 09/01/1925 Referring Provider: Deborra Medina, MD  Encounter Date: 02/28/2017      PT End of Session - 02/28/17 1413    Visit Number 2   Number of Visits 13   Date for PT Re-Evaluation 04/04/17   Authorization Type 2   Authorization Time Period of 10   PT Start Time 4401   PT Stop Time 1457   PT Time Calculation (min) 44 min   Activity Tolerance Patient tolerated treatment well   Behavior During Therapy [facility] for tasks assessed/performed      Past Medical History:  Diagnosis Date  . Atrial fibrillation ([place])   . Cancer ([place])    melanoma- right ear  . Chronic headaches started age 14  . GERD (gastroesophageal reflux disease)   . Glaucoma   . History of mitral valve replacement with mechanical valve   . Hypertension   . Osteoporosis    secondary to low testoerone  . Sciatica   . Sciatica     Past Surgical History:  Procedure Laterality Date  . CARDIOVERSION  2013  . CATARACT EXTRACTION, BILATERAL     x 2  . HERNIA REPAIR     x 2  . MELANOMA EXCISION     right ear  . MITRAL VALVE REPLACEMENT  1994  . NASAL SEPTUM SURGERY  1963  . PERMANENT PACEMAKER INSERTION Left 12/22/2014    There were no vitals filed for this visit.      Subjective Assessment - 02/28/17 1421    Subjective No R thigh pain currently (pt sitting). R lateral thigh usually bothers him when walking. Uses a walker which makes it easier.    Pertinent History Pt states having R lateral thigh pain which is his main trouble which comes from scoliosis, irriating his nerve coming out of the 5th vertebra. Has been having pain since 4 years ago, gradual onset. Goes to an osteopath in [place], was referred to PT. Had PT for 2 years in [place] until  his therapist gave up. Treatments included stretching his R side, bridging, posterior pelvic tilting with deep breaths and exhaling, supine trunk rotation. Pt was told that the exercises will not help him but might keep him from deteriorating further. Feels like his condition is worse currently. Does his HEP 2x/day regularly.  Has been using a SPC for 2 years.   Patient Stated Goals I'd like to walk without a cane or walker, without pain, have more endurance.    Currently in Pain? No/denies   Pain Score 0-No pain  no pain in sitting.    Pain Onset More than a month ago                                 PT Education - 02/28/17 1846    Education provided Yes   Education Details ther-ex   [employer]) Educated Patient   Methods Explanation;Tactile cues;Demonstration;Verbal cues   Comprehension Verbalized understanding;Returned demonstration        Objectives,    There-ex  Vitals obtained  Blood pressure L arm sitting, mechanically taken 136/58, HR 75 bpm  SpO2 100% room air  L shoulder adduction resisting yellow band 10x3 to decrease R lumbar  trunk side bending  Sitting with pillow under L hip to decrease R trunk side bend. Pt was recommended to perform this at home. Pt verbalized understanding.  Seated L hip extension isometrics 10x5 seconds for 3 sets. Decreased R trunk sitting side bend posture observed  No R lateral thigh soreness with gait with SPC afterwards   Seated R knee flexion resisting yellow band 10x2   Improved exercise technique, movement at target joints, use of target muscles after mod verbal, visual, tactile cues.     Manual therapy:   STM L lateral thigh (vastus lateralis) muscle knots, TTP R lateral mid thigh. Improved after manual therapy.   R thigh soreness with gait with SPC afterwards. Decreased soreness with gait after performing seated L hip extension isometric exercise    No pain after session., Just fatigue in R thigh per  patient. Decreased R lumbar side bend position with standing L shoulder adduction, seated L hip extension isometric exercises as well as sitting with pillow under L hip. No dizziness with turning when standing if pt performs movement slowly.           PT Long Term Goals - 02/20/17 1301      PT LONG TERM GOAL #1   Title Patient will be able to ambulate at least 350 ft with SPC on R without LOB and mod I to promote mobility.    Baseline Pt able to ambulate 222 ft with SPC on R, CGA to SBA (02/20/2017)   Time 6   Period Weeks   Status New     PT LONG TERM GOAL #2   Title Patient will improve LEFS score by at least 9 points as a demonstration of improved function.    Baseline 24/80 (02/20/2017)   Time 6   Period Weeks   Status New     PT LONG TERM GOAL #3   Title Patient will have a decrease in R lateral thigh pain to 5/10 or less at worst to promote ability to stand and walk.    Baseline 10/10 R lateral hip pain at worst (02/20/2017)   Time 6   Period Weeks   Status New     PT LONG TERM GOAL #4   Title Patient will be able to turn 360 degrees safely and independently without LOB to promote ability to perform standing tasks.    Baseline Able to turn 360 degrees to the R but felt dizziness at the end and needed assist to maintain balance (02/20/2017)   Time 6   Period Weeks   Status New               Plan - 02/28/17 1412    Clinical Impression Statement No pain after session., Just fatigue in R thigh per patient. Decreased R lumbar side bend position with standing L shoulder adduction, seated L hip extension isometric exercises as well as sitting with pillow under L hip. No dizziness with turning when standing if pt performs movement slowly.    Rehab Potential Fair   Clinical Impairments Affecting Rehab Potential Chronicity of condition, age   PT Frequency 2x / week   PT Duration 6 weeks   PT Treatment/Interventions Therapeutic activities;Therapeutic exercise;Neuromuscular  re-education;Patient/family education;Manual techniques;Dry needling;Gait training;Stair training;Functional mobility training;Aquatic Therapy   PT Next Visit Plan posture, scapular, hip strengthening, gait, balance   Consulted and Agree with Plan of Care Patient      Patient will benefit from skilled therapeutic intervention in order to improve the following  deficits and impairments:  Pain, Postural dysfunction, Improper body mechanics, Decreased strength, Difficulty walking, Decreased range of motion, Decreased endurance, Decreased balance  Visit Diagnosis: Pain in right thigh  Difficulty in walking, not elsewhere classified  Muscle weakness (generalized)  History of falling     Problem List Patient Active Problem List   Diagnosis Date Noted  . Generalized muscle weakness 07/24/2016  . Major depressive disorder, recurrent episode ([place]) 01/24/2016  . Vitamin D deficiency 01/24/2016  . Fatigue 01/23/2016  . Benign essential HTN 03/04/2015  . H/O prosthetic heart valve 03/04/2015  . Symptomatic bradycardia 02/21/2015  . Episodic lightheadedness 02/21/2015  . Exertional dyspnea 06/25/2014  . Cardiomyopathy ([place]) 03/09/2014  . Second degree AV block, Mobitz type I 11/20/2013  . Secondary cardiomyopathy ([place]) 11/18/2013  . Hyperlipidemia 04/27/2013  . Sciatica of right side 03/16/2013  . Other and unspecified hyperlipidemia 03/16/2013  . Anemia, iron deficiency 09/15/2012  . Osteoporosis, senile 09/15/2012  . Atrial flutter ([place]) 06/26/2012  . Depression 12/24/2011  . Hypertension 08/25/2011  . Neuropathy of lower extremity 08/25/2011  . Bradycardia 08/25/2011  . Atrial fibrillation ([place])   . History of mitral valve replacement with mechanical valve   . Long term current use of anticoagulant therapy 08/22/2011    Joneen Boers PT, DPT   02/28/2017, 6:55 PM  [facility] [address], [place],  [address] Phone: [phone]   Fax:  [phone]  Name: Kevin [name] MRN: [id number] Date of Birth: [birth year]

## 2017-03-04 ENCOUNTER — Ambulatory Visit: Payer: Medicare Other

## 2017-03-04 DIAGNOSIS — M6281 Muscle weakness (generalized): Secondary | ICD-10-CM

## 2017-03-04 DIAGNOSIS — M79651 Pain in right thigh: Secondary | ICD-10-CM | POA: Diagnosis not present

## 2017-03-04 DIAGNOSIS — Z9181 History of falling: Secondary | ICD-10-CM

## 2017-03-04 DIAGNOSIS — R262 Difficulty in walking, not elsewhere classified: Secondary | ICD-10-CM

## 2017-03-04 NOTE — Therapy (Signed)
[facility] [address] [place], [address] Phone: [phone]   Fax:  [phone]  Physical Therapy Treatment  Patient Details  Name: Kevin [name] MRN: [id number] Date of Birth: [birth year] Referring Provider: Deborra Medina, MD  Encounter Date: 03/04/2017      PT End of Session - 03/04/17 0858    Visit Number 3   Number of Visits 13   Date for PT Re-Evaluation 04/04/17   Authorization Type 3   Authorization Time Period of 10   PT Start Time 0858   PT Stop Time 0945   PT Time Calculation (min) 47 min   Activity Tolerance Patient tolerated treatment well   Behavior During Therapy [facility] for tasks assessed/performed      Past Medical History:  Diagnosis Date  . Atrial fibrillation ([place])   . Cancer ([place])    melanoma- right ear  . Chronic headaches started age 57  . GERD (gastroesophageal reflux disease)   . Glaucoma   . History of mitral valve replacement with mechanical valve   . Hypertension   . Osteoporosis    secondary to low testoerone  . Sciatica   . Sciatica     Past Surgical History:  Procedure Laterality Date  . CARDIOVERSION  2013  . CATARACT EXTRACTION, BILATERAL     x 2  . HERNIA REPAIR     x 2  . MELANOMA EXCISION     right ear  . MITRAL VALVE REPLACEMENT  1994  . NASAL SEPTUM SURGERY  1963  . PERMANENT PACEMAKER INSERTION Left 12/22/2014    There were no vitals filed for this visit.      Subjective Assessment - 03/04/17 0903    Subjective Woke up at 3:30 and in the morning and his R thigh was bothering him. Took a warm shower and his R thigh felt better. Last week was a bad week for dizziness and cut back on metoprolol from 2 pills to 1 pill a day and his dizziness is better. Still had some dizziness this morning. Feels on the verge of getting dizzy when sitting currently.    Pertinent History Pt states having R lateral thigh pain which is his main trouble which comes from  scoliosis, irriating his nerve coming out of the 5th vertebra. Has been having pain since 4 years ago, gradual onset. Goes to an osteopath in [place], was referred to PT. Had PT for 2 years in [place] until his therapist gave up. Treatments included stretching his R side, bridging, posterior pelvic tilting with deep breaths and exhaling, supine trunk rotation. Pt was told that the exercises will not help him but might keep him from deteriorating further. Feels like his condition is worse currently. Does his HEP 2x/day regularly.  Has been using a SPC for 2 years.   Patient Stated Goals I'd like to walk without a cane or walker, without pain, have more endurance.    Currently in Pain? Yes   Pain Score 5   Uncomfortable feeling R lateral thigh   Pain Onset More than a month ago            [facility] PT Assessment - 03/04/17 0917      Observation/Other Assessments   Observations Seated VBI test: negative                             PT Education - 03/04/17 0901  Education provided Yes   Education Details ther-ex   Person(s) Educated Patient   Methods Explanation;Demonstration;Tactile cues;Verbal cues   Comprehension Returned demonstration;Verbalized understanding        Objectives,    No dizziness with cervical rotation (R, L ) in sitting or cerical flexion or extension      There-ex  Vitals obtained             Blood pressure L arm sitting, mechanically taken 125/62, HR 79 bpm             SpO2 100% room air  Blood pressure L arm standing after 2 min 120/58. HR 72 bpm  Similar levels in sitting compared to standing.    No complain of dizziness  Seated gentle VBI testing: Negative bilaterally (-) VOR Smooth pursuit (eyes following therapist finger) L and R. Pt demonstrates horizontal beating to the L.   Dizziness, eases with rest   Seated L hip extension isometrics 10x5 seconds for 3 sets. Decreased R trunk sitting side bend posture  observed   Decreased R lateral thigh symptoms   Standing L shoulder adduction resisting yellow band 10x3 to decrease R lumbar trunk side bending    Improved exercise technique, movement at target joints, use of target muscles after mod verbal, visual, tactile cues.    Possible vestibular related factor with dizziness. Decreased R lateral hip pain following exercises to decrease R trunk side bending.          PT Long Term Goals - 02/20/17 1301      PT LONG TERM GOAL #1   Title Patient will be able to ambulate at least 350 ft with SPC on R without LOB and mod I to promote mobility.    Baseline Pt able to ambulate 222 ft with SPC on R, CGA to SBA (02/20/2017)   Time 6   Period Weeks   Status New     PT LONG TERM GOAL #2   Title Patient will improve LEFS score by at least 9 points as a demonstration of improved function.    Baseline 24/80 (02/20/2017)   Time 6   Period Weeks   Status New     PT LONG TERM GOAL #3   Title Patient will have a decrease in R lateral thigh pain to 5/10 or less at worst to promote ability to stand and walk.    Baseline 10/10 R lateral hip pain at worst (02/20/2017)   Time 6   Period Weeks   Status New     PT LONG TERM GOAL #4   Title Patient will be able to turn 360 degrees safely and independently without LOB to promote ability to perform standing tasks.    Baseline Able to turn 360 degrees to the R but felt dizziness at the end and needed assist to maintain balance (02/20/2017)   Time 6   Period Weeks   Status New               Plan - 03/04/17 4008    Clinical Impression Statement Possible vestibular related factor with dizziness. Decreased R lateral hip pain following exercises to decrease R trunk side bending.    Rehab Potential Fair   Clinical Impairments Affecting Rehab Potential Chronicity of condition, age   PT Frequency 2x / week   PT Duration 6 weeks   PT Treatment/Interventions Therapeutic activities;Therapeutic  exercise;Neuromuscular re-education;Patient/family education;Manual techniques;Dry needling;Gait training;Stair training;Functional mobility training;Aquatic Therapy   PT Next Visit Plan posture, scapular, hip strengthening,  gait, balance   Consulted and Agree with Plan of Care Patient      Patient will benefit from skilled therapeutic intervention in order to improve the following deficits and impairments:  Pain, Postural dysfunction, Improper body mechanics, Decreased strength, Difficulty walking, Decreased range of motion, Decreased endurance, Decreased balance  Visit Diagnosis: Pain in right thigh  Difficulty in walking, not elsewhere classified  Muscle weakness (generalized)  History of falling     Problem List Patient Active Problem List   Diagnosis Date Noted  . Generalized muscle weakness 07/24/2016  . Major depressive disorder, recurrent episode ([place]) 01/24/2016  . Vitamin D deficiency 01/24/2016  . Fatigue 01/23/2016  . Benign essential HTN 03/04/2015  . H/O prosthetic heart valve 03/04/2015  . Symptomatic bradycardia 02/21/2015  . Episodic lightheadedness 02/21/2015  . Exertional dyspnea 06/25/2014  . Cardiomyopathy ([place]) 03/09/2014  . Second degree AV block, Mobitz type I 11/20/2013  . Secondary cardiomyopathy ([place]) 11/18/2013  . Hyperlipidemia 04/27/2013  . Sciatica of right side 03/16/2013  . Other and unspecified hyperlipidemia 03/16/2013  . Anemia, iron deficiency 09/15/2012  . Osteoporosis, senile 09/15/2012  . Atrial flutter ([place]) 06/26/2012  . Depression 12/24/2011  . Hypertension 08/25/2011  . Neuropathy of lower extremity 08/25/2011  . Bradycardia 08/25/2011  . Atrial fibrillation ([place])   . History of mitral valve replacement with mechanical valve   . Long term current use of anticoagulant therapy 08/22/2011    Joneen Boers PT, DPT   03/04/2017, 1:38 PM  [facility] [address], [place], [address] Phone: [phone]   Fax:  [phone]  Name: Kevin [name] MRN: [id number] Date of Birth: Apr 18, 1925

## 2017-03-04 NOTE — Patient Instructions (Signed)
Gave seated L hip extension isometrics 10x3 with 5 second holds daily as part of his HEP. Pt demonstrated and verbalized understanding.

## 2017-03-05 LAB — POCT INR: INR: 2.8 — AB (ref <=1.1)

## 2017-03-06 ENCOUNTER — Ambulatory Visit: Payer: Medicare Other

## 2017-03-07 ENCOUNTER — Telehealth: Payer: Self-pay | Admitting: Internal Medicine

## 2017-03-07 ENCOUNTER — Other Ambulatory Visit: Payer: Self-pay

## 2017-03-07 ENCOUNTER — Ambulatory Visit: Payer: Medicare Other

## 2017-03-07 DIAGNOSIS — M79651 Pain in right thigh: Secondary | ICD-10-CM | POA: Diagnosis not present

## 2017-03-07 DIAGNOSIS — R262 Difficulty in walking, not elsewhere classified: Secondary | ICD-10-CM

## 2017-03-07 DIAGNOSIS — Z9181 History of falling: Secondary | ICD-10-CM

## 2017-03-07 DIAGNOSIS — M6281 Muscle weakness (generalized): Secondary | ICD-10-CM

## 2017-03-07 NOTE — Telephone Encounter (Signed)
Spoke with pt and informed him of his lab results. Pt gave a verbal understanding.  

## 2017-03-07 NOTE — Therapy (Signed)
[facility] [address], [place], [address] Phone: [phone]   Fax:  [phone]  Physical Therapy Treatment  Patient Details  Name: Kevin [name] MRN: [id number] Date of Birth: 03/05/1925 Referring Provider: Deborra Medina, MD  Encounter Date: 03/07/2017      PT End of Session - 03/07/17 1302    Visit Number 4   Number of Visits 13   Date for PT Re-Evaluation 04/04/17   Authorization Type 4   Authorization Time Period of 10   PT Start Time 1302   PT Stop Time 1350   PT Time Calculation (min) 48 min   Activity Tolerance Patient tolerated treatment well   Behavior During Therapy [facility] for tasks assessed/performed      Past Medical History:  Diagnosis Date  . Atrial fibrillation ([place])   . Cancer ([place])    melanoma- right ear  . Chronic headaches started age 46  . GERD (gastroesophageal reflux disease)   . Glaucoma   . History of mitral valve replacement with mechanical valve   . Hypertension   . Osteoporosis    secondary to low testoerone  . Sciatica   . Sciatica     Past Surgical History:  Procedure Laterality Date  . CARDIOVERSION  2013  . CATARACT EXTRACTION, BILATERAL     x 2  . HERNIA REPAIR     x 2  . MELANOMA EXCISION     right ear  . MITRAL VALVE REPLACEMENT  1994  . NASAL SEPTUM SURGERY  1963  . PERMANENT PACEMAKER INSERTION Left 12/22/2014    There were no vitals filed for this visit.      Subjective Assessment - 03/07/17 1306    Subjective Did his HEP (seated L hip extension isometrics) on Tuesday. Both knees and legs felt numb afterwards. Feeling better today.  Did his exercises from his previous therapy sessions a few years ago which helped.  Still feels slight strange feeling in both anterior legs. Did not feel it when he woke up this morning. The R thigh pain is about the same.  Pt denies bowel or bladder problems or saddle anesthesia. Pt states supine exercises: bridging,  stretching R trunk, lower trunk rotation helps    Pertinent History Pt states having R lateral thigh pain which is his main trouble which comes from scoliosis, irriating his nerve coming out of the 5th vertebra. Has been having pain since 4 years ago, gradual onset. Goes to an osteopath in [place], was referred to PT. Had PT for 2 years in [place] until his therapist gave up. Treatments included stretching his R side, bridging, posterior pelvic tilting with deep breaths and exhaling, supine trunk rotation. Pt was told that the exercises will not help him but might keep him from deteriorating further. Feels like his condition is worse currently. Does his HEP 2x/day regularly.  Has been using a SPC for 2 years.   Patient Stated Goals I'd like to walk without a cane or walker, without pain, have more endurance.    Currently in Pain? Yes   Pain Score 7   R lateral thigh   Pain Onset More than a month ago                                 PT Education - 03/07/17 1305    Education provided Yes   Education Details ther-ex  Person(s) Educated Patient   Methods Explanation;Demonstration;Tactile cues;Verbal cues   Comprehension Verbalized understanding;Returned demonstration        Objectives,    There-ex  Vitals obtained             Blood pressure L arm sitting, mechanically taken 134/60, HR 80 bpm             SpO2 100% room air  Pt was told to stop performing his seated L hip extension isometrics HEP. Pt demonstrated and verbalized understanding.   Seated trunk flexion using physioball 10x5 seconds  Then to the L 10x5 seconds to stretch R side  Slight increase in bilateral leg funny feeling   Sitting with proper posture 10x5 seconds  Decreased bilateral leg funny feeling  Then 10x10 seconds. Increased bilateral leg funny feeling.    Supine hooklying position: no funny feeling in legs  Supine posterior pelvic tilts 10x5 seconds  Increased  bilateral leg funny feeling  Supine bridge with pillow between knees 10x 5 seconds (pt previous HEP). 4 min, no charge  Decreased bilateral leg funny feeling  Supine gentle naval ins 10x5 seconds for 2 sets to activate transversus abdominis muscles. Decreased bilateral leg funny feeling  Then with hip fall outs 5x each LE to promote lumbo pelvic control.    Improved exercise technique, movement at target joints, use of target muscles after mod verbal, visual, tactile cues.     Decreased symptoms with core activation (gentle) in supine. Normal bilateral leg feeling and improved R lateral thigh symptoms after session.          PT Long Term Goals - 02/20/17 1301      PT LONG TERM GOAL #1   Title Patient will be able to ambulate at least 350 ft with SPC on R without LOB and mod I to promote mobility.    Baseline Pt able to ambulate 222 ft with SPC on R, CGA to SBA (02/20/2017)   Time 6   Period Weeks   Status New     PT LONG TERM GOAL #2   Title Patient will improve LEFS score by at least 9 points as a demonstration of improved function.    Baseline 24/80 (02/20/2017)   Time 6   Period Weeks   Status New     PT LONG TERM GOAL #3   Title Patient will have a decrease in R lateral thigh pain to 5/10 or less at worst to promote ability to stand and walk.    Baseline 10/10 R lateral hip pain at worst (02/20/2017)   Time 6   Period Weeks   Status New     PT LONG TERM GOAL #4   Title Patient will be able to turn 360 degrees safely and independently without LOB to promote ability to perform standing tasks.    Baseline Able to turn 360 degrees to the R but felt dizziness at the end and needed assist to maintain balance (02/20/2017)   Time 6   Period Weeks   Status New               Plan - 03/07/17 1302    Clinical Impression Statement Decreased symptoms with core activation (gentle) in supine. Normal bilateral leg feeling and improved R lateral thigh symptoms after session.     Rehab Potential Fair   Clinical Impairments Affecting Rehab Potential Chronicity of condition, age   PT Frequency 2x / week   PT Duration 6 weeks   PT Treatment/Interventions Therapeutic  activities;Therapeutic exercise;Neuromuscular re-education;Patient/family education;Manual techniques;Dry needling;Gait training;Stair training;Functional mobility training;Aquatic Therapy   PT Next Visit Plan posture, scapular, hip strengthening, gait, balance   Consulted and Agree with Plan of Care Patient      Patient will benefit from skilled therapeutic intervention in order to improve the following deficits and impairments:  Pain, Postural dysfunction, Improper body mechanics, Decreased strength, Difficulty walking, Decreased range of motion, Decreased endurance, Decreased balance  Visit Diagnosis: Pain in right thigh  Difficulty in walking, not elsewhere classified  Muscle weakness (generalized)  History of falling     Problem List Patient Active Problem List   Diagnosis Date Noted  . Generalized muscle weakness 07/24/2016  . Major depressive disorder, recurrent episode ([place]) 01/24/2016  . Vitamin D deficiency 01/24/2016  . Fatigue 01/23/2016  . Benign essential HTN 03/04/2015  . H/O prosthetic heart valve 03/04/2015  . Symptomatic bradycardia 02/21/2015  . Episodic lightheadedness 02/21/2015  . Exertional dyspnea 06/25/2014  . Cardiomyopathy ([place]) 03/09/2014  . Second degree AV block, Mobitz type I 11/20/2013  . Secondary cardiomyopathy ([place]) 11/18/2013  . Hyperlipidemia 04/27/2013  . Sciatica of right side 03/16/2013  . Other and unspecified hyperlipidemia 03/16/2013  . Anemia, iron deficiency 09/15/2012  . Osteoporosis, senile 09/15/2012  . Atrial flutter ([place]) 06/26/2012  . Depression 12/24/2011  . Hypertension 08/25/2011  . Neuropathy of lower extremity 08/25/2011  . Bradycardia 08/25/2011  . Atrial fibrillation ([place])   . History of mitral valve replacement with  mechanical valve   . Long term current use of anticoagulant therapy 08/22/2011    Joneen Boers PT, DPT   03/07/2017, 2:11 PM  [facility] [address], [place], [address] Phone: [phone]   Fax:  [phone]  Name: Kevin [name] MRN: [id number] Date of Birth: 29-Oct-1925

## 2017-03-07 NOTE — Patient Instructions (Addendum)
Pt was told to stop performing his seated L hip extension isometrics. Continue performing the HEP from [facility] clinic (previous PT a few years ago). Added supine gentle naval in exercise 5 seconds at a time (pt to also breathe) to continue activating his core muscles. Pt demonstrated and verbalized understanding.

## 2017-03-07 NOTE — Telephone Encounter (Signed)
  Your INR/coumadin level is therapeutic,  Continue current regimen and repeat PT/INR in one month.   

## 2017-03-12 ENCOUNTER — Ambulatory Visit: Payer: Medicare Other | Attending: Internal Medicine

## 2017-03-12 DIAGNOSIS — M79651 Pain in right thigh: Secondary | ICD-10-CM | POA: Insufficient documentation

## 2017-03-12 DIAGNOSIS — Z9181 History of falling: Secondary | ICD-10-CM

## 2017-03-12 DIAGNOSIS — R262 Difficulty in walking, not elsewhere classified: Secondary | ICD-10-CM

## 2017-03-12 DIAGNOSIS — M6281 Muscle weakness (generalized): Secondary | ICD-10-CM | POA: Diagnosis present

## 2017-03-12 NOTE — Therapy (Signed)
[facility] [address] [place], [address] Phone: [phone]   Fax:  [phone]  Physical Therapy Treatment  Patient Details  Name: Kevin [name] MRN: [id number] Date of Birth: 12/03/1925 Referring Provider: Deborra Medina, MD  Encounter Date: 03/12/2017      PT End of Session - 03/12/17 1435    Visit Number 5   Number of Visits 13   Date for PT Re-Evaluation 04/04/17   Authorization Type 5   Authorization Time Period of 10   PT Start Time 1435   PT Stop Time 1522   PT Time Calculation (min) 47 min   Activity Tolerance Patient tolerated treatment well   Behavior During Therapy [facility] for tasks assessed/performed      Past Medical History:  Diagnosis Date  . Atrial fibrillation ([place])   . Cancer ([place])    melanoma- right ear  . Chronic headaches started age 12  . GERD (gastroesophageal reflux disease)   . Glaucoma   . History of mitral valve replacement with mechanical valve   . Hypertension   . Osteoporosis    secondary to low testoerone  . Sciatica   . Sciatica     Past Surgical History:  Procedure Laterality Date  . CARDIOVERSION  2013  . CATARACT EXTRACTION, BILATERAL     x 2  . HERNIA REPAIR     x 2  . MELANOMA EXCISION     right ear  . MITRAL VALVE REPLACEMENT  1994  . NASAL SEPTUM SURGERY  1963  . PERMANENT PACEMAKER INSERTION Left 12/22/2014    There were no vitals filed for this visit.      Subjective Assessment - 03/12/17 1437    Subjective No pain in R thigh when sitting, 5/10 R lateral thigh pain when walking. The funny feeling in his legs are worse. Feels weak and numb.  Got worse yesterday but does not know what increased it.  Denies bowel or  bladder problems or saddle anesthesia. Has had the funny feeling in his legs before sometimes during the last 2-3 months, some days are better than others.  Does not know what makes the funny feeling increase except being active. Was  active this morning.    Pertinent History Pt states having R lateral thigh pain which is his main trouble which comes from scoliosis, irriating his nerve coming out of the 5th vertebra. Has been having pain since 4 years ago, gradual onset. Goes to an osteopath in [place], was referred to PT. Had PT for 2 years in [place] until his therapist gave up. Treatments included stretching his R side, bridging, posterior pelvic tilting with deep breaths and exhaling, supine trunk rotation. Pt was told that the exercises will not help him but might keep him from deteriorating further. Feels like his condition is worse currently. Does his HEP 2x/day regularly.  Has been using a SPC for 2 years.   Patient Stated Goals I'd like to walk without a cane or walker, without pain, have more endurance.    Currently in Pain? Yes   Pain Score 5    Pain Onset More than a month ago                                 PT Education - 03/12/17 1839    Education provided Yes   Education Details ther-ex   [employer]) Educated  Patient   Methods Explanation;Demonstration;Tactile cues;Verbal cues   Comprehension Verbalized understanding;Returned demonstration       Objectives   Supine transversus abdominis contraction 10x2 with 5 second holds  Supine posterior pelvic tilts 10x2 (not billed secondary to pt performing from previous PT) Supine isometric resitance to lower trunk rotation 10x 5 seconds for 2 sets Supine manually resisted pallof press 10x5 seconds for 2 sets  To promote trunk muscle use  Supine manually resisted L shoulder adduction 5x2 to decrease R trunk side bend posture  Dizziness with supine to sit.   Vitals obtained, L arm sitting mechanically taken. 112/56 Pt also states not wearing his glasses  Dizziness subsided, pt states having a headache which is normal for hip per pt reports. Pt also states taking meclizine for dizziness.    Improved exercise technique, movement  at target joints, use of target muscles after min to mod verbal, visual, tactile cues.    Worked on gentle strengthening of his trunk muscles today in supine secondary to pt symptoms tolerating that position the best. Pt tolerated session without aggravation of bilateral anterior leg symptoms.             PT Long Term Goals - 02/20/17 1301      PT LONG TERM GOAL #1   Title Patient will be able to ambulate at least 350 ft with SPC on R without LOB and mod I to promote mobility.    Baseline Pt able to ambulate 222 ft with SPC on R, CGA to SBA (02/20/2017)   Time 6   Period Weeks   Status New     PT LONG TERM GOAL #2   Title Patient will improve LEFS score by at least 9 points as a demonstration of improved function.    Baseline 24/80 (02/20/2017)   Time 6   Period Weeks   Status New     PT LONG TERM GOAL #3   Title Patient will have a decrease in R lateral thigh pain to 5/10 or less at worst to promote ability to stand and walk.    Baseline 10/10 R lateral hip pain at worst (02/20/2017)   Time 6   Period Weeks   Status New     PT LONG TERM GOAL #4   Title Patient will be able to turn 360 degrees safely and independently without LOB to promote ability to perform standing tasks.    Baseline Able to turn 360 degrees to the R but felt dizziness at the end and needed assist to maintain balance (02/20/2017)   Time 6   Period Weeks   Status New               Plan - 03/12/17 1839    Clinical Impression Statement Worked on gentle strengthening of his trunk muscles today in supine secondary to pt symptoms tolerating that position the best. Pt tolerated session without aggravation of bilateral anterior leg symptoms.    Rehab Potential Fair   Clinical Impairments Affecting Rehab Potential Chronicity of condition, age   PT Frequency 2x / week   PT Duration 6 weeks   PT Treatment/Interventions Therapeutic activities;Therapeutic exercise;Neuromuscular re-education;Patient/family  education;Manual techniques;Dry needling;Gait training;Stair training;Functional mobility training;Aquatic Therapy   PT Next Visit Plan posture, scapular, hip strengthening, gait, balance   Consulted and Agree with Plan of Care Patient      Patient will benefit from skilled therapeutic intervention in order to improve the following deficits and impairments:  Pain, Postural dysfunction, Improper body  mechanics, Decreased strength, Difficulty walking, Decreased range of motion, Decreased endurance, Decreased balance  Visit Diagnosis: Pain in right thigh  Difficulty in walking, not elsewhere classified  Muscle weakness (generalized)  History of falling     Problem List Patient Active Problem List   Diagnosis Date Noted  . Generalized muscle weakness 07/24/2016  . Major depressive disorder, recurrent episode ([place]) 01/24/2016  . Vitamin D deficiency 01/24/2016  . Fatigue 01/23/2016  . Benign essential HTN 03/04/2015  . H/O prosthetic heart valve 03/04/2015  . Symptomatic bradycardia 02/21/2015  . Episodic lightheadedness 02/21/2015  . Exertional dyspnea 06/25/2014  . Cardiomyopathy ([place]) 03/09/2014  . Second degree AV block, Mobitz type I 11/20/2013  . Secondary cardiomyopathy ([place]) 11/18/2013  . Hyperlipidemia 04/27/2013  . Sciatica of right side 03/16/2013  . Other and unspecified hyperlipidemia 03/16/2013  . Anemia, iron deficiency 09/15/2012  . Osteoporosis, senile 09/15/2012  . Atrial flutter ([place]) 06/26/2012  . Depression 12/24/2011  . Hypertension 08/25/2011  . Neuropathy of lower extremity 08/25/2011  . Bradycardia 08/25/2011  . Atrial fibrillation ([place])   . History of mitral valve replacement with mechanical valve   . Long term current use of anticoagulant therapy 08/22/2011     Joneen Boers PT, DPT  03/12/2017, 6:54 PM  [facility] [address] [place], [address] Phone: [phone]    Fax:  [phone]  Name: Kevin [name] MRN: [id number] Date of Birth: [birth year]

## 2017-03-13 ENCOUNTER — Ambulatory Visit: Payer: Medicare Other

## 2017-03-13 DIAGNOSIS — Z9181 History of falling: Secondary | ICD-10-CM

## 2017-03-13 DIAGNOSIS — M79651 Pain in right thigh: Secondary | ICD-10-CM

## 2017-03-13 DIAGNOSIS — M6281 Muscle weakness (generalized): Secondary | ICD-10-CM

## 2017-03-13 DIAGNOSIS — R262 Difficulty in walking, not elsewhere classified: Secondary | ICD-10-CM

## 2017-03-13 NOTE — Therapy (Signed)
[facility] [address], [place], [address] Phone: [phone]   Fax:  [phone]  Physical Therapy Treatment  Patient Details  Name: Kevin [name] MRN: [id number] Date of Birth: 1924-12-24 Referring Provider: Deborra Medina, MD  Encounter Date: 03/13/2017      PT End of Session - 03/13/17 1036    Visit Number 6   Number of Visits 13   Date for PT Re-Evaluation 04/04/17   Authorization Type 6   Authorization Time Period of 10   PT Start Time 1036   PT Stop Time 1117   PT Time Calculation (min) 41 min   Activity Tolerance Patient tolerated treatment well   Behavior During Therapy [facility] for tasks assessed/performed      Past Medical History:  Diagnosis Date  . Atrial fibrillation ([place])   . Cancer ([place])    melanoma- right ear  . Chronic headaches started age 83  . GERD (gastroesophageal reflux disease)   . Glaucoma   . History of mitral valve replacement with mechanical valve   . Hypertension   . Osteoporosis    secondary to low testoerone  . Sciatica   . Sciatica     Past Surgical History:  Procedure Laterality Date  . CARDIOVERSION  2013  . CATARACT EXTRACTION, BILATERAL     x 2  . HERNIA REPAIR     x 2  . MELANOMA EXCISION     right ear  . MITRAL VALVE REPLACEMENT  1994  . NASAL SEPTUM SURGERY  1963  . PERMANENT PACEMAKER INSERTION Left 12/22/2014    There were no vitals filed for this visit.      Subjective Assessment - 03/13/17 1040    Subjective Both legs feel pretty good today. Felt R thigh pain when getting up this morning (8/10), 4/10 when walking from waiting room to treatment room. Pt adds that making his bed in the mornings is very exhausting.       Pertinent History Pt states having R lateral thigh pain which is his main trouble which comes from scoliosis, irriating his nerve coming out of the 5th vertebra. Has been having pain since 4 years ago, gradual onset. Goes to an  osteopath in [place], was referred to PT. Had PT for 2 years in [place] until his therapist gave up. Treatments included stretching his R side, bridging, posterior pelvic tilting with deep breaths and exhaling, supine trunk rotation. Pt was told that the exercises will not help him but might keep him from deteriorating further. Feels like his condition is worse currently. Does his HEP 2x/day regularly.  Has been using a SPC for 2 years.   Patient Stated Goals I'd like to walk without a cane or walker, without pain, have more endurance.    Currently in Pain? Yes   Pain Score 4   R lateral thigh pain when walking   Pain Onset More than a month ago                            Objectives     There-ex  Vitals obtained. Blood pressure L arm sitting, mechanically taken: 123/61, HR 75 BPM  Sitting with pillow under L hip to decrease R trunk side bend:   Seated pallof press (straight) resisting yellow band 10x   Then 10x2 with 5 second holds   Seated hp adduction ball squeeze with glute max squeeze 10x3  with 5 second holds   Seated bilateral low rows resiting yellow band 10x2    Seated bilateral rows resisting yellow band 10x2  Gait with SPC about 50 ft, no pain but pt fatigue   Improved exercise technique, movement at target joints, use of target muscles after min to  mod verbal, visual, tactile cues.     Decreased R lateral thigh pain with gait following core strengthening and gentle thoracic extension/scapular  strengthening exercises.       PT Education - 03/13/17 1107    Education provided Yes   Education Details ther-ex   [employer]) Educated Patient   Methods Explanation;Demonstration;Tactile cues;Verbal cues   Comprehension Returned demonstration;Verbalized understanding             PT Long Term Goals - 02/20/17 1301      PT LONG TERM GOAL #1   Title Patient will be able to ambulate at least 350 ft with SPC on R without LOB and mod I  to promote mobility.    Baseline Pt able to ambulate 222 ft with SPC on R, CGA to SBA (02/20/2017)   Time 6   Period Weeks   Status New     PT LONG TERM GOAL #2   Title Patient will improve LEFS score by at least 9 points as a demonstration of improved function.    Baseline 24/80 (02/20/2017)   Time 6   Period Weeks   Status New     PT LONG TERM GOAL #3   Title Patient will have a decrease in R lateral thigh pain to 5/10 or less at worst to promote ability to stand and walk.    Baseline 10/10 R lateral hip pain at worst (02/20/2017)   Time 6   Period Weeks   Status New     PT LONG TERM GOAL #4   Title Patient will be able to turn 360 degrees safely and independently without LOB to promote ability to perform standing tasks.    Baseline Able to turn 360 degrees to the R but felt dizziness at the end and needed assist to maintain balance (02/20/2017)   Time 6   Period Weeks   Status New               Plan - 03/13/17 1109    Clinical Impression Statement Decreased R lateral thigh pain with gait following core strengthening and gentle thoracic extension/scapular  strengthening exercises.    Rehab Potential Fair   Clinical Impairments Affecting Rehab Potential Chronicity of condition, age   PT Frequency 2x / week   PT Duration 6 weeks   PT Treatment/Interventions Therapeutic activities;Therapeutic exercise;Neuromuscular re-education;Patient/family education;Manual techniques;Dry needling;Gait training;Stair training;Functional mobility training;Aquatic Therapy   PT Next Visit Plan posture, scapular, hip strengthening, gait, balance   Consulted and Agree with Plan of Care Patient      Patient will benefit from skilled therapeutic intervention in order to improve the following deficits and impairments:  Pain, Postural dysfunction, Improper body mechanics, Decreased strength, Difficulty walking, Decreased range of motion, Decreased endurance, Decreased balance  Visit  Diagnosis: Pain in right thigh  Difficulty in walking, not elsewhere classified  Muscle weakness (generalized)  History of falling     Problem List Patient Active Problem List   Diagnosis Date Noted  . Generalized muscle weakness 07/24/2016  . Major depressive disorder, recurrent episode ([place]) 01/24/2016  . Vitamin D deficiency 01/24/2016  . Fatigue 01/23/2016  . Benign essential HTN 03/04/2015  . H/O prosthetic heart  valve 03/04/2015  . Symptomatic bradycardia 02/21/2015  . Episodic lightheadedness 02/21/2015  . Exertional dyspnea 06/25/2014  . Cardiomyopathy ([place]) 03/09/2014  . Second degree AV block, Mobitz type I 11/20/2013  . Secondary cardiomyopathy ([place]) 11/18/2013  . Hyperlipidemia 04/27/2013  . Sciatica of right side 03/16/2013  . Other and unspecified hyperlipidemia 03/16/2013  . Anemia, iron deficiency 09/15/2012  . Osteoporosis, senile 09/15/2012  . Atrial flutter ([place]) 06/26/2012  . Depression 12/24/2011  . Hypertension 08/25/2011  . Neuropathy of lower extremity 08/25/2011  . Bradycardia 08/25/2011  . Atrial fibrillation ([place])   . History of mitral valve replacement with mechanical valve   . Long term current use of anticoagulant therapy 08/22/2011    Joneen Boers PT, DPT   03/13/2017, 8:03 PM  [facility] [address] [place], [address] Phone: [phone]   Fax:  [phone]  Name: ALANDIS BLUEMEL MRN: [id number] Date of Birth: [birth year]

## 2017-03-18 ENCOUNTER — Ambulatory Visit: Payer: Medicare Other

## 2017-03-18 DIAGNOSIS — M79651 Pain in right thigh: Secondary | ICD-10-CM | POA: Diagnosis not present

## 2017-03-18 DIAGNOSIS — R262 Difficulty in walking, not elsewhere classified: Secondary | ICD-10-CM

## 2017-03-18 DIAGNOSIS — Z9181 History of falling: Secondary | ICD-10-CM

## 2017-03-18 DIAGNOSIS — M6281 Muscle weakness (generalized): Secondary | ICD-10-CM

## 2017-03-18 NOTE — Therapy (Signed)
[facility] [address], [place], [address] Phone: [phone]   Fax:  [phone]  Physical Therapy Treatment  Patient Details  Name: Kevin [name] MRN: [id number] Date of Birth: 03/14/1925 Referring Provider: Deborra Medina, MD  Encounter Date: 03/18/2017      PT End of Session - 03/18/17 1035    Visit Number 7   Number of Visits 13   Date for PT Re-Evaluation 04/04/17   Authorization Type 7   Authorization Time Period of 10   PT Start Time 1035   PT Stop Time 1126   PT Time Calculation (min) 51 min   Activity Tolerance Patient tolerated treatment well   Behavior During Therapy [facility] for tasks assessed/performed      Past Medical History:  Diagnosis Date  . Atrial fibrillation ([place])   . Cancer ([place])    melanoma- right ear  . Chronic headaches started age 83  . GERD (gastroesophageal reflux disease)   . Glaucoma   . History of mitral valve replacement with mechanical valve   . Hypertension   . Osteoporosis    secondary to low testoerone  . Sciatica   . Sciatica     Past Surgical History:  Procedure Laterality Date  . CARDIOVERSION  2013  . CATARACT EXTRACTION, BILATERAL     x 2  . HERNIA REPAIR     x 2  . MELANOMA EXCISION     right ear  . MITRAL VALVE REPLACEMENT  1994  . NASAL SEPTUM SURGERY  1963  . PERMANENT PACEMAKER INSERTION Left 12/22/2014    There were no vitals filed for this visit.      Subjective Assessment - 03/18/17 1037    Subjective Got a terrible headache about 10 am this morning. Took a pill and it's easing off. 4/10 R lateral thigh currenlty (pt sitting). Both legs have been better.    Pertinent History Pt states having R lateral thigh pain which is his main trouble which comes from scoliosis, irriating his nerve coming out of the 5th vertebra. Has been having pain since 4 years ago, gradual onset. Goes to an osteopath in [place], was referred to PT. Had PT for 2  years in [place] until his therapist gave up. Treatments included stretching his R side, bridging, posterior pelvic tilting with deep breaths and exhaling, supine trunk rotation. Pt was told that the exercises will not help him but might keep him from deteriorating further. Feels like his condition is worse currently. Does his HEP 2x/day regularly.  Has been using a SPC for 2 years.   Patient Stated Goals I'd like to walk without a cane or walker, without pain, have more endurance.    Currently in Pain? Yes   Pain Score 4   R lateral thigh   Pain Onset More than a month ago                                 PT Education - 03/18/17 1142    Education provided Yes   Education Details ther-ex, HEP   Person(s) Educated Patient   Methods Explanation;Demonstration;Tactile cues;Verbal cues;Handout   Comprehension Verbalized understanding;Returned demonstration        Objectives     There-ex  Vitals obtained. Blood pressure L arm sitting, manually taken: 124/64, HR 75 BPM  Sitting with pillow under L hip to decrease R trunk side  bend:              Seated pallof press (straight) resisting yellow band 10x3 with 5 second holds   Reviewed and given as part of HEP. Handout provided              Seated bilateral low rows resiting yellow band 10x3 to promote trunk muscle activation       Slight increase in R lateral thigh symptoms.               Seated hp adduction ball squeeze with glute max squeeze 10x3 with 5 second holds     Decreased R lateral thigh symptoms.                Seated bilateral rows resisting yellow band 10x2  Standing mini squats with bilateral UE assist on table 10x. Cues needed to promote hip flexion. L trunk side bending to stretch R trunk observed  Slight increase in bilateral lateral leg discomfort.    Improved exercise technique, movement at target joints, use of target muscles after min to  mod verbal, visual, tactile  cues.     Decreased R lateral thigh symptoms with seated hip adduction pillow squeeze with glute max squeeze exercise. Good stretch observed to the R trunk when patient uses bilateral UE assist for standing mini squats. Cues needed for proper hip flexion. Slight increase in bilateral lateral leg pain however afterwards. Continued with gentle thoracic strengthening to promote posture.            PT Long Term Goals - 02/20/17 1301      PT LONG TERM GOAL #1   Title Patient will be able to ambulate at least 350 ft with SPC on R without LOB and mod I to promote mobility.    Baseline Pt able to ambulate 222 ft with SPC on R, CGA to SBA (02/20/2017)   Time 6   Period Weeks   Status New     PT LONG TERM GOAL #2   Title Patient will improve LEFS score by at least 9 points as a demonstration of improved function.    Baseline 24/80 (02/20/2017)   Time 6   Period Weeks   Status New     PT LONG TERM GOAL #3   Title Patient will have a decrease in R lateral thigh pain to 5/10 or less at worst to promote ability to stand and walk.    Baseline 10/10 R lateral hip pain at worst (02/20/2017)   Time 6   Period Weeks   Status New     PT LONG TERM GOAL #4   Title Patient will be able to turn 360 degrees safely and independently without LOB to promote ability to perform standing tasks.    Baseline Able to turn 360 degrees to the R but felt dizziness at the end and needed assist to maintain balance (02/20/2017)   Time 6   Period Weeks   Status New               Plan - 03/18/17 1132    Clinical Impression Statement Decreased R lateral thigh symptoms with seated hip adduction pillow squeeze with glute max squeeze exercise. Good stretch observed to the R trunk when patient uses bilateral UE assist for standing mini squats. Cues needed for proper hip flexion. Slight increase in bilateral lateral leg pain however afterwards. Continued with gentle thoracic strengthening to promote posture.     Rehab Potential Fair   Clinical  Impairments Affecting Rehab Potential Chronicity of condition, age   PT Frequency 2x / week   PT Duration 6 weeks   PT Treatment/Interventions Therapeutic activities;Therapeutic exercise;Neuromuscular re-education;Patient/family education;Manual techniques;Dry needling;Gait training;Stair training;Functional mobility training;Aquatic Therapy   PT Next Visit Plan posture, scapular, hip strengthening, gait, balance   Consulted and Agree with Plan of Care Patient      Patient will benefit from skilled therapeutic intervention in order to improve the following deficits and impairments:  Pain, Postural dysfunction, Improper body mechanics, Decreased strength, Difficulty walking, Decreased range of motion, Decreased endurance, Decreased balance  Visit Diagnosis: Difficulty in walking, not elsewhere classified  Muscle weakness (generalized)  History of falling  Pain in right thigh     Problem List Patient Active Problem List   Diagnosis Date Noted  . Generalized muscle weakness 07/24/2016  . Major depressive disorder, recurrent episode ([place]) 01/24/2016  . Vitamin D deficiency 01/24/2016  . Fatigue 01/23/2016  . Benign essential HTN 03/04/2015  . H/O prosthetic heart valve 03/04/2015  . Symptomatic bradycardia 02/21/2015  . Episodic lightheadedness 02/21/2015  . Exertional dyspnea 06/25/2014  . Cardiomyopathy ([place]) 03/09/2014  . Second degree AV block, Mobitz type I 11/20/2013  . Secondary cardiomyopathy ([place]) 11/18/2013  . Hyperlipidemia 04/27/2013  . Sciatica of right side 03/16/2013  . Other and unspecified hyperlipidemia 03/16/2013  . Anemia, iron deficiency 09/15/2012  . Osteoporosis, senile 09/15/2012  . Atrial flutter ([place]) 06/26/2012  . Depression 12/24/2011  . Hypertension 08/25/2011  . Neuropathy of lower extremity 08/25/2011  . Bradycardia 08/25/2011  . Atrial fibrillation ([place])   . History of mitral valve replacement with  mechanical valve   . Long term current use of anticoagulant therapy 08/22/2011   Joneen Boers PT, DPT   03/18/2017, 11:45 AM  [facility] [address], [place], [address] Phone: [phone]   Fax:  [phone]  Name: Kevin [name] MRN: [id number] Date of Birth: October 23, 1925

## 2017-03-18 NOTE — Patient Instructions (Addendum)
  Adduction: Hip - Knees Together (Sitting)   Sit with two folded pillows between knees. Not shown. Squeeze your rear end muscles and push knees together.  Medium effort.     Hold for _5__ seconds. Repeat _10__ times. Do __3_ times a day.  Copyright  [employer]. All rights reserved.                  Seated pallof press (straight) resisting yellow band 10x3 with 5 second holds   Reviewed and given as part of HEP. Handout provided. Pt demonstrated and verbalized understanding.

## 2017-03-20 ENCOUNTER — Ambulatory Visit: Payer: Medicare Other

## 2017-03-20 DIAGNOSIS — M79651 Pain in right thigh: Secondary | ICD-10-CM | POA: Diagnosis not present

## 2017-03-20 DIAGNOSIS — Z9181 History of falling: Secondary | ICD-10-CM

## 2017-03-20 DIAGNOSIS — M6281 Muscle weakness (generalized): Secondary | ICD-10-CM

## 2017-03-20 DIAGNOSIS — R262 Difficulty in walking, not elsewhere classified: Secondary | ICD-10-CM

## 2017-03-20 NOTE — Patient Instructions (Signed)
Pt was recommended to cut down his HEP repetitions and holds in half to allow his body to recover afterwards.  Pt verbalized understanding.

## 2017-03-20 NOTE — Therapy (Signed)
[facility] [address] [place], [address] Phone: [phone]   Fax:  [phone]  Physical Therapy Treatment  Patient Details  Name: Kevin [name] MRN: [id number] Date of Birth: Nov 13, 1925 Referring Provider: Deborra Medina, MD  Encounter Date: 03/20/2017      PT End of Session - 03/20/17 0951    Visit Number 8   Number of Visits 13   Date for PT Re-Evaluation 04/04/17   Authorization Type 8   Authorization Time Period of 10   PT Start Time 0952   PT Stop Time 1033   PT Time Calculation (min) 41 min   Activity Tolerance Patient tolerated treatment well   Behavior During Therapy [facility] for tasks assessed/performed      Past Medical History:  Diagnosis Date  . Atrial fibrillation ([place])   . Cancer ([place])    melanoma- right ear  . Chronic headaches started age 83  . GERD (gastroesophageal reflux disease)   . Glaucoma   . History of mitral valve replacement with mechanical valve   . Hypertension   . Osteoporosis    secondary to low testoerone  . Sciatica   . Sciatica     Past Surgical History:  Procedure Laterality Date  . CARDIOVERSION  2013  . CATARACT EXTRACTION, BILATERAL     x 2  . HERNIA REPAIR     x 2  . MELANOMA EXCISION     right ear  . MITRAL VALVE REPLACEMENT  1994  . NASAL SEPTUM SURGERY  1963  . PERMANENT PACEMAKER INSERTION Left 12/22/2014    There were no vitals filed for this visit.      Subjective Assessment - 03/20/17 0953    Subjective R side is pretty good. Feels sort of trembly today. Overall nervous feeling. Sort of a weak feeling. Ate breakfast, no hx of diabetes. Has a bad headache. Also does not feel very strong in either leg today.  Might be due to some of the exercises. The mini squats on Monday might have been too much on him physical and strength wise.  Felt the trembly feeling somewhat after Monday, was better yesterday, more pronounced today. Both legs do not  feel so good today. Not as strong    Pertinent History Pt states having R lateral thigh pain which is his main trouble which comes from scoliosis, irriating his nerve coming out of the 5th vertebra. Has been having pain since 4 years ago, gradual onset. Goes to an osteopath in [place], was referred to PT. Had PT for 2 years in [place] until his therapist gave up. Treatments included stretching his R side, bridging, posterior pelvic tilting with deep breaths and exhaling, supine trunk rotation. Pt was told that the exercises will not help him but might keep him from deteriorating further. Feels like his condition is worse currently. Does his HEP 2x/day regularly.  Has been using a SPC for 2 years.   Patient Stated Goals I'd like to walk without a cane or walker, without pain, have more endurance.    Currently in Pain? Other (Comment)  no pain level provided   Pain Onset More than a month ago                                 PT Education - 03/20/17 1247    Education provided Yes   Education Details ther-ex, HEP  Person(s) Educated Patient   Methods Explanation;Demonstration;Tactile cues;Verbal cues   Comprehension Returned demonstration;Verbalized understanding      Objectives   There-ex  Vitals obtained. Blood pressure L arm sitting, mechanically taken: 105/58 HR 64   Seated ankle PF 10x3 to promote blood flow  Seated ankle DF 5x    Seated hp adduction ball squeeze with glute max squeeze 5x2 with 3 second holds  Seated knee flexion using (25 cm) physioball 5x2 each LE   Sitting with pillow under L hip to decrease R trunk side bend:   Gentle manual resistance to trunk to promote gentle strengthening 10 seconds x 3   Pt was recommended to cut down his HEP repetitions and holds in half to allow his body to recover afterwards.  Pt verbalized understanding.    Improved exercise technique, movement at target joints, use of target muscles after  min to mod verbal, visual, tactile cues.     Both legs (bilateral L5 dermatome area) feel about the same, R thigh and headache feels better after session. Light session performed to allow recovery of muscles from one session to the next. Continued working on gentle hip, thigh, knee and trunk strengthening.             PT Long Term Goals - 02/20/17 1301      PT LONG TERM GOAL #1   Title Patient will be able to ambulate at least 350 ft with SPC on R without LOB and mod I to promote mobility.    Baseline Pt able to ambulate 222 ft with SPC on R, CGA to SBA (02/20/2017)   Time 6   Period Weeks   Status New     PT LONG TERM GOAL #2   Title Patient will improve LEFS score by at least 9 points as a demonstration of improved function.    Baseline 24/80 (02/20/2017)   Time 6   Period Weeks   Status New     PT LONG TERM GOAL #3   Title Patient will have a decrease in R lateral thigh pain to 5/10 or less at worst to promote ability to stand and walk.    Baseline 10/10 R lateral hip pain at worst (02/20/2017)   Time 6   Period Weeks   Status New     PT LONG TERM GOAL #4   Title Patient will be able to turn 360 degrees safely and independently without LOB to promote ability to perform standing tasks.    Baseline Able to turn 360 degrees to the R but felt dizziness at the end and needed assist to maintain balance (02/20/2017)   Time 6   Period Weeks   Status New               Plan - 03/20/17 6468    Clinical Impression Statement Both legs (bilateral L5 dermatome area) feel about the same, R thigh and headache feels better after session. Light session performed to allow recovery of muscles from one session to the next. Continued working on gentle hip, thigh, knee and trunk strengthening.    Rehab Potential Fair   Clinical Impairments Affecting Rehab Potential Chronicity of condition, age   PT Frequency 2x / week   PT Duration 6 weeks   PT Treatment/Interventions Therapeutic  activities;Therapeutic exercise;Neuromuscular re-education;Patient/family education;Manual techniques;Dry needling;Gait training;Stair training;Functional mobility training;Aquatic Therapy   PT Next Visit Plan posture, scapular, hip strengthening, gait, balance   Consulted and Agree with Plan of Care Patient  Patient will benefit from skilled therapeutic intervention in order to improve the following deficits and impairments:  Pain, Postural dysfunction, Improper body mechanics, Decreased strength, Difficulty walking, Decreased range of motion, Decreased endurance, Decreased balance  Visit Diagnosis: Difficulty in walking, not elsewhere classified  Muscle weakness (generalized)  History of falling  Pain in right thigh     Problem List Patient Active Problem List   Diagnosis Date Noted  . Generalized muscle weakness 07/24/2016  . Major depressive disorder, recurrent episode ([place]) 01/24/2016  . Vitamin D deficiency 01/24/2016  . Fatigue 01/23/2016  . Benign essential HTN 03/04/2015  . H/O prosthetic heart valve 03/04/2015  . Symptomatic bradycardia 02/21/2015  . Episodic lightheadedness 02/21/2015  . Exertional dyspnea 06/25/2014  . Cardiomyopathy ([place]) 03/09/2014  . Second degree AV block, Mobitz type I 11/20/2013  . Secondary cardiomyopathy ([place]) 11/18/2013  . Hyperlipidemia 04/27/2013  . Sciatica of right side 03/16/2013  . Other and unspecified hyperlipidemia 03/16/2013  . Anemia, iron deficiency 09/15/2012  . Osteoporosis, senile 09/15/2012  . Atrial flutter ([place]) 06/26/2012  . Depression 12/24/2011  . Hypertension 08/25/2011  . Neuropathy of lower extremity 08/25/2011  . Bradycardia 08/25/2011  . Atrial fibrillation ([place])   . History of mitral valve replacement with mechanical valve   . Long term current use of anticoagulant therapy 08/22/2011    Joneen Boers PT, DPT   03/20/2017, 12:50 PM  [facility] [address] [place], [address] Phone: [phone]   Fax:  [phone]  Name: Kevin [name] MRN: [id number] Date of Birth: 01/15/1925

## 2017-03-25 ENCOUNTER — Ambulatory Visit: Payer: Medicare Other

## 2017-03-25 DIAGNOSIS — M6281 Muscle weakness (generalized): Secondary | ICD-10-CM

## 2017-03-25 DIAGNOSIS — R262 Difficulty in walking, not elsewhere classified: Secondary | ICD-10-CM

## 2017-03-25 DIAGNOSIS — M79651 Pain in right thigh: Secondary | ICD-10-CM | POA: Diagnosis not present

## 2017-03-25 DIAGNOSIS — Z9181 History of falling: Secondary | ICD-10-CM

## 2017-03-25 NOTE — Patient Instructions (Addendum)
Pt was recommended to hold off on the seated hip adduction pillow squeeze HEP. Pt verbalized understanding.     Pt was recommended to continue walking as tolerated with either his SPC or rw and rest breaks as needed to not overwork himself since walking had a positive effect for his R lateral thigh today and to promote increase in activity tolerance. Pt verbalized understanding.

## 2017-03-25 NOTE — Therapy (Addendum)
[facility] [address], [place], [address] Phone: [phone]   Fax:  [phone]  Physical Therapy Treatment And Progress Report  Patient Details  Name: Kevin [name] MRN: [id number] Date of Birth: [birth year] Referring Provider: Deborra Medina, MD  Encounter Date: 03/25/2017       PT End of Session - 03/25/17 1120    Visit Number 9   Number of Visits 13   Date for PT Re-Evaluation 04/04/17   Authorization Type 1   Authorization Time Period of 10   PT Start Time 1120   PT Stop Time 1204   PT Time Calculation (min) 44 min   Activity Tolerance Patient tolerated treatment well   Behavior During Therapy [facility] for tasks assessed/performed      Past Medical History:  Diagnosis Date  . Atrial fibrillation ([place])   . Cancer ([place])    melanoma- right ear  . Chronic headaches started age 46  . GERD (gastroesophageal reflux disease)   . Glaucoma   . History of mitral valve replacement with mechanical valve   . Hypertension   . Osteoporosis    secondary to low testoerone  . Sciatica   . Sciatica     Past Surgical History:  Procedure Laterality Date  . CARDIOVERSION  2013  . CATARACT EXTRACTION, BILATERAL     x 2  . HERNIA REPAIR     x 2  . MELANOMA EXCISION     right ear  . MITRAL VALVE REPLACEMENT  1994  . NASAL SEPTUM SURGERY  1963  . PERMANENT PACEMAKER INSERTION Left 12/22/2014    There were no vitals filed for this visit.      Subjective Assessment - 03/25/17 1125    Subjective R thigh feels terrible 9/10 currently (9-10 at most for the past 7 days) since doing the seated pillow squeeze exercise. R thigh and outer legs felt like he has done too much (strained feeling) after last session on Wendesday.  Sitting helps ease of the symptoms.    Pertinent History Pt states having R lateral thigh pain which is his main trouble which comes from scoliosis, irriating his nerve coming out of the 5th  vertebra. Has been having pain since 4 years ago, gradual onset. Goes to an osteopath in [place], was referred to PT. Had PT for 2 years in [place] until his therapist gave up. Treatments included stretching his R side, bridging, posterior pelvic tilting with deep breaths and exhaling, supine trunk rotation. Pt was told that the exercises will not help him but might keep him from deteriorating further. Feels like his condition is worse currently. Does his HEP 2x/day regularly.  Has been using a SPC for 2 years.   Patient Stated Goals I'd like to walk without a cane or walker, without pain, have more endurance.    Currently in Pain? Yes   Pain Score 9    Pain Onset More than a month ago                                 PT Education - 03/25/17 1224    Education provided Yes   Education Details ther-ex   [employer]) Educated Patient   Methods Explanation;Demonstration;Tactile cues;Verbal cues   Comprehension Returned demonstration;Verbalized understanding        Objectives  There-ex  Vitals obtained. Blood pressure L arm sitting, mechanically taken: 122/58  HR 67  Sitting with pillow under L hip  Decreased R lateral thigh pain to 5/10      Then with hip adduction pillow squeeze (gentle 5x5 seconds)    Increased R lateral thigh pain to 7/10   Then with gentle manually resisted hip abduction 5x5 seconds    Then with seated manually resisted R hip extension isometrics (gentle), manual resistance from PT 5x5 seconds   Gait with SPC on R side x 4 min  400 ft, 97% SpO2. Pt states usually walks with a rw when walking far.   No R lateral thigh or bilateral lateral leg pain afterwards. Feels pain on the bottom of his feet.   Pt was recommended to hold off performing the seated hip adduction pillow squeeze home exercise. Pt verbalized understanding.   Sitting with pillow under L hip to decrease R trunk side bend:              Gentle manual resistance to  trunk to promote gentle strengthening 10 seconds x 3  Pt was recommended to continue walking as tolerated with either his SPC or rw and rest breaks as needed to not overwork himself since walking had a positive effect for his R lateral thigh today and to promote increase in activity tolerance. Pt verbalized understanding.     Improved exercise technique, movement at target joints, use of target muscles after min to mod verbal, visual, tactile cues.     Pt demonstrates overall improved gait tolerance and endurance, being able to ambulate up to 400 ft today using his SPC prior to needing a rest break, which is greater than the 222 ft he ambulated with his SPC during his initial evaluation. No R lateral thigh and bilateral lateral leg pain afterwards but pt felt bilateral plantar foot symptoms in which bilateral foot pronation may play a factor. Pt still has difficulty with R lateral thigh and bilateral lateral leg pain as well as difficulty performing functional tasks due to his symptoms and deconditioning. Patient will benefit from continued skilled physical therapy services to address the aforementioned deficits.           PT Long Term Goals - 03/25/17 1201      PT LONG TERM GOAL #1   Title Patient will be able to ambulate at least 350 ft with SPC on R without LOB and mod I to promote mobility.    Baseline Pt able to ambulate 222 ft with SPC on R, CGA to SBA (02/20/2017); 400 ft with SPC (03/25/2017)   Time 6   Period Weeks   Status Achieved     PT LONG TERM GOAL #2   Title Patient will improve LEFS score by at least 9 points as a demonstration of improved function.    Baseline 24/80 (02/20/2017)   Time 6   Period Weeks   Status Deferred     PT LONG TERM GOAL #3   Title Patient will have a decrease in R lateral thigh pain to 5/10 or less at worst to promote ability to stand and walk.    Baseline 10/10 R lateral hip pain at worst (02/20/2017); 9-10/10 R lateral thigh pain at worst for the  past 7 days (03/25/2017)   Time 6   Period Weeks   Status On-going     PT LONG TERM GOAL #4   Title Patient will be able to turn 360 degrees safely and independently without LOB to promote ability to perform standing tasks.  Baseline Able to turn 360 degrees to the R but felt dizziness at the end and needed assist to maintain balance (02/20/2017)   Time 6   Period Weeks   Status Deferred     PT LONG TERM GOAL #5   Title Patient will be able to ambulate at least 500 ft with SPC on R without LOB and mod I to promote mobility.    Baseline 400 ft with SPC (Apr 21, 2017)   Time 6   Period Weeks   Status New               Plan - 04/21/2017 1222    Clinical Impression Statement Pt demonstrates overall improved gait tolerance and endurance, being able to ambulate up to 400 ft today using his SPC prior to needing a rest break, which is greater than the 222 ft he ambulated with his SPC during his initial evaluation. No R lateral thigh and bilateral lateral leg pain afterwards but pt felt bilateral plantar foot symptoms in which bilateral foot pronation may play a factor. Pt still has difficulty with R lateral thigh and bilateral lateral leg pain as well as difficulty performing functional tasks due to his symptoms and deconditioning. Patient will benefit from continued skilled physical therapy services to address the aforementioned deficits.     Rehab Potential Fair   Clinical Impairments Affecting Rehab Potential Chronicity of condition, age   PT Frequency 2x / week   PT Duration 6 weeks   PT Treatment/Interventions Therapeutic activities;Therapeutic exercise;Neuromuscular re-education;Patient/family education;Manual techniques;Dry needling;Gait training;Stair training;Functional mobility training;Aquatic Therapy   PT Next Visit Plan posture, scapular, hip strengthening, gait, balance   Consulted and Agree with Plan of Care Patient      Patient will benefit from skilled therapeutic  intervention in order to improve the following deficits and impairments:  Pain, Postural dysfunction, Improper body mechanics, Decreased strength, Difficulty walking, Decreased range of motion, Decreased endurance, Decreased balance  Visit Diagnosis: Pain in right thigh  Difficulty in walking, not elsewhere classified  Muscle weakness (generalized)  History of falling       G-Codes - 04/21/17 1227    Functional Assessment Tool Used (Outpatient Only) clinical presentation, patient interview, 6 minute walk test (able to perform 4 min)   Functional Limitation Mobility: Walking and moving around   Mobility: Walking and Moving Around Current Status ([id number]) At least 60 percent but less than 80 percent impaired, limited or restricted   Mobility: Walking and Moving Around Goal Status [phone]) At least 40 percent but less than 60 percent impaired, limited or restricted      Problem List Patient Active Problem List   Diagnosis Date Noted  . Generalized muscle weakness 07/24/2016  . Major depressive disorder, recurrent episode ([place]) 01/24/2016  . Vitamin D deficiency 01/24/2016  . Fatigue 01/23/2016  . Benign essential HTN 03/04/2015  . H/O prosthetic heart valve 03/04/2015  . Symptomatic bradycardia 02/21/2015  . Episodic lightheadedness 02/21/2015  . Exertional dyspnea 06/25/2014  . Cardiomyopathy ([place]) 03/09/2014  . Second degree AV block, Mobitz type I 11/20/2013  . Secondary cardiomyopathy ([place]) 11/18/2013  . Hyperlipidemia 04/27/2013  . Sciatica of right side 03/16/2013  . Other and unspecified hyperlipidemia 03/16/2013  . Anemia, iron deficiency 09/15/2012  . Osteoporosis, senile 09/15/2012  . Atrial flutter ([place]) 06/26/2012  . Depression 12/24/2011  . Hypertension 08/25/2011  . Neuropathy of lower extremity 08/25/2011  . Bradycardia 08/25/2011  . Atrial fibrillation ([place])   . History of mitral valve replacement with mechanical valve   .  Long term current use of  anticoagulant therapy 08/22/2011     Thank you for your referral.    Joneen Boers PT, DPT     03/25/2017, 12:48 PM  [facility] [address] [place], [address] Phone: [phone]   Fax:  [phone]  Name: Kevin [name] MRN: [id number] Date of Birth: 1925/09/25

## 2017-03-26 LAB — POCT INR: INR: 3.4 — AB (ref 0.9–1.1)

## 2017-03-26 LAB — PROTIME-INR

## 2017-03-27 ENCOUNTER — Ambulatory Visit: Payer: Medicare Other

## 2017-03-27 ENCOUNTER — Ambulatory Visit (INDEPENDENT_AMBULATORY_CARE_PROVIDER_SITE_OTHER): Payer: Medicare Other | Admitting: Internal Medicine

## 2017-03-27 ENCOUNTER — Encounter: Payer: Self-pay | Admitting: Internal Medicine

## 2017-03-27 ENCOUNTER — Other Ambulatory Visit: Payer: Self-pay

## 2017-03-27 DIAGNOSIS — M6281 Muscle weakness (generalized): Secondary | ICD-10-CM

## 2017-03-27 DIAGNOSIS — Z9181 History of falling: Secondary | ICD-10-CM

## 2017-03-27 DIAGNOSIS — I10 Essential (primary) hypertension: Secondary | ICD-10-CM | POA: Diagnosis not present

## 2017-03-27 DIAGNOSIS — Z0000 Encounter for general adult medical examination without abnormal findings: Secondary | ICD-10-CM

## 2017-03-27 DIAGNOSIS — G5793 Unspecified mononeuropathy of bilateral lower limbs: Secondary | ICD-10-CM | POA: Diagnosis not present

## 2017-03-27 DIAGNOSIS — F329 Major depressive disorder, single episode, unspecified: Secondary | ICD-10-CM

## 2017-03-27 DIAGNOSIS — M79651 Pain in right thigh: Secondary | ICD-10-CM

## 2017-03-27 DIAGNOSIS — R262 Difficulty in walking, not elsewhere classified: Secondary | ICD-10-CM

## 2017-03-27 NOTE — Progress Notes (Signed)
Pre visit review using our clinic review tool, if applicable. No additional management support is needed unless otherwise documented below in the visit note. 

## 2017-03-27 NOTE — Patient Instructions (Signed)
  Sitting on a chair with a pillow under your left hip:   Gently squeeze your rear end muscles together.    Hold for 5 seconds.    Repeat 5 times.     Perform 3 sessions daily.

## 2017-03-27 NOTE — Progress Notes (Signed)
Patient ID: Kevin [name], male    DOB: May 23, 1925  Age: 83 y.o. MRN: [id number]  The patient is here for annual  examination and management of other chronic and acute problems.   The risk factors are reflected in the social history.  The roster of all physicians providing medical care to patient - is listed in the Snapshot section of the chart.  Activities of daily living:  The patient is 100% independent in all ADLs: dressing, toileting, feeding as well as independent mobility  Home safety : The patient has smoke detectors in the home. They wear seatbelts.  There are no firearms at home. There is no violence in the home.   There is no risks for hepatitis, STDs or HIV. There is no   history of blood transfusion. They have no travel history to infectious disease endemic areas of the world.  The patient has seen their dentist in the last six month. They have seen their eye doctor in the last year. They admit to slight hearing difficulty with regard to whispered voices and some television programs.  They have deferred audiologic testing in the last year.  They do not  have excessive sun exposure. Discussed the need for sun protection: hats, long sleeves and use of sunscreen if there is significant sun exposure.   Diet: the importance of a healthy diet is discussed. They do have a healthy diet.  The benefits of regular aerobic exercise were discussed. She walks 4 times per week ,  20 minutes.   Depression screen: there are no signs or vegative symptoms of depression- irritability, change in appetite, anhedonia, sadness/tearfullness.  Cognitive assessment: the patient manages all their financial and personal affairs and is actively engaged. They could relate day,date,year and events; recalled 2/3 objects at 3 minutes; performed clock-face test normally.  The following portions of the patient's history were reviewed and updated as appropriate: allergies, current medications, past family history,  past medical history,  past surgical history, past social history  and problem list.  Visual acuity was not assessed per patient preference since she has regular follow up with her ophthalmologist. Hearing and body mass index were assessed and reviewed.   During the course of the visit the patient was educated and counseled about appropriate screening and preventive services including : fall prevention , diabetes screening, nutrition counseling, colorectal cancer screening, and recommended immunizations.    Patient has a living will.  No copy  on file.  During the course of the visit , End of Life objectives were discussed at length,  DNR signed. Will be scheduled for MOST discussion   CC: Diagnoses of Reactive depression, Benign essential HTN, Neuropathy involving both lower extremities, and Encounter for preventive health examination were pertinent to this visit.  Headaches and dizziness have returned,   Dr Clayborn Bigness reduced the metoprolol several months ago which helped the dizziness but the headaches started back a month ago.  Not occurring daily  ,  Averaging 2-3/week . Taking acetominophen, no longer taking fioricet .  Pain occurs  later in the day around noon,  Does not wake up with them , no vision changes.  Located bi frontal,  right more than left.   8/10 in [place],  But relieved by tylenol.   Losing his  hearing despite bilateral hearing aides  Losing his vision,  No longer reading at night.  Sees eye doctor 2-3/year  Glaucoma stable. s/p bilateral cataract surgery .  Appetite good  . Weight stable  Had a minor fall one month ago, stumbled over a lamp cord on the floor.   Saw PT for severe scoliosis causing back pain  .  Some  Recommended to  place a pad under left buttock to help keep hips at even level   Walking makes right hip hurt,  Uses cane or walker .  PT working to increase his walking endurance  Occasional insomnia caused by painful sciatica .  Remote rx of gabapentin      History Kevin [name] has a past medical history of Atrial fibrillation ([place]); Cancer ([place]); Chronic headaches (started age 89); GERD (gastroesophageal reflux disease); Glaucoma; History of mitral valve replacement with mechanical valve; Hypertension; Osteoporosis; Sciatica; and Sciatica.   He has a past surgical history that includes Nasal septum surgery (1963); Cataract extraction, bilateral; Melanoma excision; Mitral valve replacement (1994); Hernia repair; Cardioversion (2013); and Permanent pacemaker insertion (Left, 12/22/2014).   His family history includes Alzheimer's disease in his brother; Arrhythmia in his brother; Cancer in his father; Mental illness in his mother.He reports that he has never smoked. He has never used smokeless tobacco. He reports that he does not drink alcohol or use drugs.  Outpatient Medications Prior to Visit  Medication Sig Dispense Refill  . 5-Hydroxytryptophan (5-HTP PO) Take by mouth daily.    . ALPHA LIPOIC ACID PO Take by mouth daily.    Marland Kitchen amLODipine (NORVASC) 5 MG tablet Take 1 tablet (5 mg total) by mouth daily. 30 tablet 5  . Ascorbic Acid (VITAMIN C) 100 MG tablet Take 100 mg by mouth daily.    . brimonidine (ALPHAGAN) 0.15 % ophthalmic solution 2 (two) times daily.     . Calcium-Magnesium-Vitamin D (CALCIUM MAGNESIUM PO) Take by mouth 2 (two) times daily.    . cholecalciferol (VITAMIN D) 1000 units tablet Take 1,000 Units by mouth daily.    Marland Kitchen co-enzyme Q-10 30 MG capsule Take 1 capsule (30 mg total) by mouth daily. 90 capsule 3  . Coenzyme Q10-Levocarnitine (CO Q-10 PLUS PO) Take 1 capsule by mouth daily.    . DHA-EPA-Vit B6-B12-Folic Acid (CARDIOVID PLUS) CAPS Take 1 capsule by mouth 2 (two) times daily.     . ferrous sulfate 324 (65 FE) MG TBEC Take 1 tablet by mouth every other day.    . fluocinonide cream (LIDEX) 4.09 % Apply 1 application topically 2 (two) times daily.  0  . gabapentin (NEURONTIN) 100 MG capsule Take 1 capsule (100 mg total) by  mouth 3 (three) times daily. 90 capsule 3  . Guggulipid (GUGULIPID PO) Take by mouth daily.    Marland Kitchen ketoconazole (NIZORAL) 2 % cream Apply 1 application topically 2 (two) times daily.     Marland Kitchen KETOPROFEN-KETAMINE-LIDOCAINE EX Apply 1 application topically 2 (two) times daily.    . L-GLUTAMINE PO Take by mouth.      . latanoprost (XALATAN) 0.005 % ophthalmic solution daily.     Marland Kitchen losartan (COZAAR) 100 MG tablet TAKE 1 TABLET BY MOUTH EVERY DAY 90 tablet 1  . losartan (COZAAR) 100 MG tablet TAKE 1 TABLET BY MOUTH EVERY DAY 90 tablet 2  . LUMIGAN 0.01 % SOLN 1 drop at bedtime.     . metoprolol tartrate (LOPRESSOR) 25 MG tablet TAKE 1 TABLET BY MOUTH TWICE DAILY 60 tablet 6  . Multiple Vitamin (MULTIVITAMIN) tablet Take 1 tablet by mouth daily.      . NON FORMULARY Memory Essentials 2 tablets BID.    Marland Kitchen NON FORMULARY Pro Liver Milk Thistle and Acetyl-Cysteine  1 tablet daily.    . Nutritional Supplements (MELATONIN PO) Take by mouth.    . Omega-3 Fatty Acids (OMEGA 3 PO) Take by mouth.      Marland Kitchen omeprazole (PRILOSEC) 40 MG capsule Take 1 capsule (40 mg total) by mouth daily. 30 capsule 5  . Oyster Shell Calcium 500 MG TABS Take two by mouth daily     . Red Yeast Rice Extract (RED YEAST RICE PO) Take by mouth.      . RUTIN PO Take by mouth.    . SYRINGE-NEEDLE, DISP, 3 ML (SAFETY-LOK 3CC SYR 22GX1.5") 22G X 1-1/2" 3 ML MISC 2 ml's monthly-given 1 shot monthly     . TURMERIC PO Take by mouth.      . warfarin (COUMADIN) 2 MG tablet TAKE 2 TABLETS BY MOUTH ON ALL DAYS, BUT TAKE 1 AND 1/2 TABLETS ON WEDNESDAY 180 tablet 1  . warfarin (COUMADIN) 3 MG tablet TAKE 1 TABLET BY MOUTH ON TUESDAY, THURSDAY, SATURDAY, AND SUNDAY 51 tablet 1   No facility-administered medications prior to visit.     Review of Systems   Patient denies headache, fevers, malaise, unintentional weight loss, skin rash, eye pain, sinus congestion and sinus pain, sore throat, dysphagia,  hemoptysis , cough, dyspnea, wheezing, chest pain,  palpitations, orthopnea, edema, abdominal pain, nausea, melena, diarrhea, constipation, flank pain, dysuria, hematuria, urinary  Frequency, nocturia, numbness, tingling, seizures,  Focal weakness, Loss of consciousness,  Tremor, insomnia, depression, anxiety, and suicidal ideation.      Objective:  BP 114/66   Pulse 69   Temp 97.5 F (36.4 C) (Oral)   Resp 14   Ht 5\' 7"  (1.702 m)   Wt 149 lb 6.4 oz (67.8 kg)   SpO2 96%   BMI 23.40 kg/m   Physical Exam  General appearance: alert, cooperative and appears stated age Ears: normal TM's and external ear canals both ears Throat: lips, mucosa, and tongue normal; teeth and gums normal Neck: no adenopathy, no carotid bruit, supple, symmetrical, trachea midline and thyroid not enlarged, symmetric, no tenderness/mass/nodules Back: severe scoliosis  ROM resgrited. No CVA tenderness. Lungs: clear to auscultation bilaterally Heart: regular rate and rhythm, S1, S2 normal, no murmur, click, rub or gallop Abdomen: soft, non-tender; bowel sounds normal; no masses,  no organomegaly Pulses: 2+ and symmetric Skin: Skin color, texture, turgor normal. No rashes or lesions Lymph nodes: Cervical, supraclavicular, and axillary nodes normal. Neuro:  awake and interactive with normal mood and affect. Higher cortical functions are normal. Speech is clear without word-finding difficulty or dysarthria. Extraocular movements are intact. Visual fields of both eyes are grossly intact. Sensation to light touch is grossly intact bilaterally of upper and lower extremities. Motor examination shows 4+/5 symmetric hand grip and upper extremity and 5/5 lower extremity strength. There is no pronation or drift. Gait is non-ataxic     Assessment & Plan:   Problem List Items Addressed This Visit    Benign essential HTN    Well controlled on current regimen. Renal function stable, no changes today.  Lab Results  Component Value Date   CREATININE 0.92 01/21/2017   Lab  Results  Component Value Date   NA 133 (L) 01/21/2017   K 4.3 01/21/2017   CL 98 01/21/2017   CO2 30 01/21/2017         Depression    Aggravated by chronic pain and decreased mobility. He did not tolerate Cymbalta .  Will address his pain with daily use of tylenol( 2000  mg daily max dose outlined) add gabapentin, and  reassess need for additional therapy with ssri trial       Encounter for preventive health examination    Annual comprehensive preventive exam was done as well as an evaluation and management of acute and chronic conditions .  During the course of the visit the patient was educated and counseled about appropriate screening and preventive services including :  diabetes screening, lipid analysis with projected  10 year  risk for CAD , nutrition counseling, prostate and colorectal cancer screening, and recommended immunizations.  Printed recommendations for health maintenance screenings was given.       Neuropathy of lower extremity     No signs of reversible causes,  May be related to spinal stenosis .  Resume trial of  gabapentin.  Marland Kitchenlassttsh Lab Results  Component Value Date   VITAMINB12 889 08/22/2011            I am having Mr. Marik maintain his SYRINGE-NEEDLE (DISP) 3 ML, Oyster Shell Calcium, L-GLUTAMINE PO, Omega-3 Fatty Acids (OMEGA 3 PO), Red Yeast Rice Extract (RED YEAST RICE PO), TURMERIC PO, multivitamin, brimonidine, latanoprost, Nutritional Supplements (MELATONIN PO), DHA-EPA-Vit B6-B12-Folic Acid, LUMIGAN, NON FORMULARY, NON FORMULARY, 5-Hydroxytryptophan (5-HTP PO), Guggulipid (GUGULIPID PO), Calcium-Magnesium-Vitamin D (CALCIUM MAGNESIUM PO), ALPHA LIPOIC ACID PO, vitamin C, RUTIN PO, ketoconazole, ferrous sulfate, KETOPROFEN-KETAMINE-LIDOCAINE EX, gabapentin, co-enzyme Q-10, Coenzyme Q10-Levocarnitine (CO Q-10 PLUS PO), warfarin, losartan, cholecalciferol, amLODipine, metoprolol tartrate, omeprazole, warfarin, losartan, fluocinonide cream,  diphenoxylate-atropine, and meclizine.  Meds ordered this encounter  Medications  . diphenoxylate-atropine (LOMOTIL) 2.5-0.025 MG tablet    Sig: TK 1 T PO QID PRF DIARRHEA    Refill:  0  . meclizine (ANTIVERT) 12.5 MG tablet    Sig: Take by mouth.    There are no discontinued medications.  Follow-up: Return in about 4 weeks (around 04/24/2017) for ADVANCE DIRECTIVES/MOST DISCUSSION .   Crecencio Mc, MD

## 2017-03-27 NOTE — Patient Instructions (Addendum)
I recommend that you try taking one gabapentin at bedtime as needed for your sciatica pain  If it has no effect , we should replace the old expired pills with fresh ones.Madaline Brilliant to continue using tylenol   Your INR/coumadin level is therapeutic,  Continue current regimen and repeat PT/INR in one month.   We started to discuss end of life issues today.  I have given you a Do Not Resuscitate Order to display on your refrigerator  PLEASE SCHEDULE A RETURN VISIT FOR ANOTHER DISCUSSION ABOUT OTHER TREATMENTS YOU MAY WANT TO DEFER IF YOU ARE VERY VERY ILL    I WOULD LIKE TO HAVE A COPY OF YOUR ADVANCE DIRECTIVES

## 2017-03-27 NOTE — Therapy (Signed)
[facility] [address] [place], [address] Phone: [phone]   Fax:  [phone]  Physical Therapy Treatment  Patient Details  Name: Kevin [name] MRN: [id number] Date of Birth: 23-Oct-1925 Referring Provider: Deborra Medina, MD  Encounter Date: 03/27/2017      PT End of Session - 03/27/17 1348    Visit Number 10   Number of Visits 13   Date for PT Re-Evaluation 04/04/17   Authorization Type 2   Authorization Time Period of 10   PT Start Time 1348   PT Stop Time 1419   PT Time Calculation (min) 31 min   Activity Tolerance Patient tolerated treatment well   Behavior During Therapy [facility] for tasks assessed/performed      Past Medical History:  Diagnosis Date  . Atrial fibrillation ([place])   . Cancer ([place])    melanoma- right ear  . Chronic headaches started age 50  . GERD (gastroesophageal reflux disease)   . Glaucoma   . History of mitral valve replacement with mechanical valve   . Hypertension   . Osteoporosis    secondary to low testoerone  . Sciatica   . Sciatica     Past Surgical History:  Procedure Laterality Date  . CARDIOVERSION  2013  . CATARACT EXTRACTION, BILATERAL     x 2  . HERNIA REPAIR     x 2  . MELANOMA EXCISION     right ear  . MITRAL VALVE REPLACEMENT  1994  . NASAL SEPTUM SURGERY  1963  . PERMANENT PACEMAKER INSERTION Left 12/22/2014    There were no vitals filed for this visit.      Subjective Assessment - 03/27/17 1355    Subjective The doctor's appointment was fine. Came in with a bad headache earlier and took acetaminophen. Doctor told him that if his headache was bad enough, he should take gabapentin. Woke up this morning at around 3 am with bad sciatica (R lateral thigh pain). MD told him to try his gabapentin to see if it helps. 7/10 R lateral thigh pain currently. Pt states waking up this morning with his R lateral thigh pain lying on his R side.  Pt states  bilateral lateral legs are doing pretty good.    Pertinent History Pt states having R lateral thigh pain which is his main trouble which comes from scoliosis, irriating his nerve coming out of the 5th vertebra. Has been having pain since 4 years ago, gradual onset. Goes to an osteopath in [place], was referred to PT. Had PT for 2 years in [place] until his therapist gave up. Treatments included stretching his R side, bridging, posterior pelvic tilting with deep breaths and exhaling, supine trunk rotation. Pt was told that the exercises will not help him but might keep him from deteriorating further. Feels like his condition is worse currently. Does his HEP 2x/day regularly.  Has been using a SPC for 2 years.   Patient Stated Goals I'd like to walk without a cane or walker, without pain, have more endurance.    Currently in Pain? Yes   Pain Score 7    Pain Onset More than a month ago                                 PT Education - 03/27/17 1438    Education provided Yes   Education Details ther-ex,  HEP   Person(s) Educated Patient   Methods Explanation;Demonstration;Tactile cues;Verbal cues;Handout   Comprehension Verbalized understanding;Returned demonstration     Objectives   There-ex  Pt was recommended to try sitting on a pillow under his L hip when his symptoms occur in the morning again if it wakes him up or laying on his L side with pillows between knees to take pressure off his R side. Pt verbalized understanding.   Vitals obtained. Blood pressure L arm sitting, mechanically taken: 109/52 HR 72   Seated gentle hip adductor balls squeeze 5x5 seconds with glute max squeeze. No increase in symptoms.  Seated knee flexion/extension using physioball 10x each LE to promote gentle hamstring use and LE movement  Sitting with pillow under L hip:  Gentle manual resistance to trunk all planes  to promote gentle strengthening 10 seconds x 4  Then with  gentle manual resistance to L trunk side bending (to take pressure off R lateral low back area) 10 seconds x 3  Seated glute max 5x5 seconds  Reviewed and given as part of his HEP. Handout provided. Pt demonstrated and verbalized understanding.     Try standing alternating toe taps with bilateral UE assist if apropriate next visit to work on gentle strengthening and balance.    Improved exercise technique, movement at target joints, use of target muscles after min to mod verbal, visual, tactile cues.    No R lateral thigh pain after session. Light exercises performed so as not to overwork patient. Continued working on gentle muscle activation and core muscle use to decrease R low back pressure, promote posture, and keep scoliosis from increasing.         PT Long Term Goals - 03/25/17 1201      PT LONG TERM GOAL #1   Title Patient will be able to ambulate at least 350 ft with SPC on R without LOB and mod I to promote mobility.    Baseline Pt able to ambulate 222 ft with SPC on R, CGA to SBA (02/20/2017); 400 ft with SPC (03/25/2017)   Time 6   Period Weeks   Status Achieved     PT LONG TERM GOAL #2   Title Patient will improve LEFS score by at least 9 points as a demonstration of improved function.    Baseline 24/80 (02/20/2017)   Time 6   Period Weeks   Status Deferred     PT LONG TERM GOAL #3   Title Patient will have a decrease in R lateral thigh pain to 5/10 or less at worst to promote ability to stand and walk.    Baseline 10/10 R lateral hip pain at worst (02/20/2017); 9-10/10 R lateral thigh pain at worst for the past 7 days (03/25/2017)   Time 6   Period Weeks   Status On-going     PT LONG TERM GOAL #4   Title Patient will be able to turn 360 degrees safely and independently without LOB to promote ability to perform standing tasks.    Baseline Able to turn 360 degrees to the R but felt dizziness at the end and needed assist to maintain balance (02/20/2017)   Time 6    Period Weeks   Status Deferred     PT LONG TERM GOAL #5   Title Patient will be able to ambulate at least 500 ft with SPC on R without LOB and mod I to promote mobility.    Baseline 400 ft with SPC (03/25/2017)   Time  6   Period Weeks   Status New               Plan - 03/27/17 1430    Clinical Impression Statement No R lateral thigh pain after session. Light exercises performed so as not to overwork patient. Continued working on gentle muscle activation and core muscle use to decrease R low back pressure, promote posture, and keep scoliosis from increasing.    Rehab Potential Fair   Clinical Impairments Affecting Rehab Potential Chronicity of condition, age   PT Frequency 2x / week   PT Duration 6 weeks   PT Treatment/Interventions Therapeutic activities;Therapeutic exercise;Neuromuscular re-education;Patient/family education;Manual techniques;Dry needling;Gait training;Stair training;Functional mobility training;Aquatic Therapy   PT Next Visit Plan posture, scapular, hip strengthening, gait, balance   Consulted and Agree with Plan of Care Patient      Patient will benefit from skilled therapeutic intervention in order to improve the following deficits and impairments:  Pain, Postural dysfunction, Improper body mechanics, Decreased strength, Difficulty walking, Decreased range of motion, Decreased endurance, Decreased balance  Visit Diagnosis: Pain in right thigh  Difficulty in walking, not elsewhere classified  Muscle weakness (generalized)  History of falling     Problem List Patient Active Problem List   Diagnosis Date Noted  . Generalized muscle weakness 07/24/2016  . Major depressive disorder, recurrent episode ([place]) 01/24/2016  . Vitamin D deficiency 01/24/2016  . Fatigue 01/23/2016  . Benign essential HTN 03/04/2015  . H/O prosthetic heart valve 03/04/2015  . Symptomatic bradycardia 02/21/2015  . Episodic lightheadedness 02/21/2015  . Exertional dyspnea  06/25/2014  . Cardiomyopathy ([place]) 03/09/2014  . Second degree AV block, Mobitz type I 11/20/2013  . Secondary cardiomyopathy ([place]) 11/18/2013  . Hyperlipidemia 04/27/2013  . Sciatica of right side 03/16/2013  . Other and unspecified hyperlipidemia 03/16/2013  . Anemia, iron deficiency 09/15/2012  . Osteoporosis, senile 09/15/2012  . Atrial flutter ([place]) 06/26/2012  . Depression 12/24/2011  . Hypertension 08/25/2011  . Neuropathy of lower extremity 08/25/2011  . Bradycardia 08/25/2011  . Atrial fibrillation ([place])   . History of mitral valve replacement with mechanical valve   . Long term current use of anticoagulant therapy 08/22/2011   Joneen Boers PT, DPT   03/27/2017, 2:45 PM  [facility] [address] [place], [address] Phone: [phone]   Fax:  [phone]  Name: Kevin [name] MRN: [id number] Date of Birth: [birth year]

## 2017-03-28 ENCOUNTER — Telehealth: Payer: Self-pay | Admitting: Internal Medicine

## 2017-03-28 NOTE — Telephone Encounter (Signed)
  Your INR/coumadin level is therapeutic,  Continue current regimen and repeat PT/INR in one month.   

## 2017-03-29 NOTE — Telephone Encounter (Signed)
Left voice mail to call back 

## 2017-03-29 NOTE — Telephone Encounter (Signed)
Patient advised of below and verbalized understanding.  

## 2017-03-30 DIAGNOSIS — Z0000 Encounter for general adult medical examination without abnormal findings: Secondary | ICD-10-CM | POA: Insufficient documentation

## 2017-03-30 NOTE — Assessment & Plan Note (Signed)
Well controlled on current regimen. Renal function stable, no changes today.  Lab Results  Component Value Date   CREATININE 0.92 01/21/2017   Lab Results  Component Value Date   NA 133 (L) 01/21/2017   K 4.3 01/21/2017   CL 98 01/21/2017   CO2 30 01/21/2017

## 2017-03-30 NOTE — Assessment & Plan Note (Signed)
Aggravated by chronic pain and decreased mobility. He did not tolerate Cymbalta .  Will address his pain with daily use of tylenol( 2000 mg daily max dose outlined) add gabapentin, and  reassess need for additional therapy with ssri trial

## 2017-03-30 NOTE — Assessment & Plan Note (Signed)
No signs of reversible causes,  May be related to spinal stenosis .  Resume trial of  gabapentin.  Marland Kitchenlassttsh Lab Results  Component Value Date   [id number] 784 08/22/2011

## 2017-03-30 NOTE — Assessment & Plan Note (Signed)

## 2017-03-31 ENCOUNTER — Other Ambulatory Visit: Payer: Self-pay | Admitting: Cardiovascular Disease

## 2017-04-03 ENCOUNTER — Ambulatory Visit: Payer: Medicare Other

## 2017-04-03 DIAGNOSIS — M79651 Pain in right thigh: Secondary | ICD-10-CM

## 2017-04-03 DIAGNOSIS — M6281 Muscle weakness (generalized): Secondary | ICD-10-CM

## 2017-04-03 DIAGNOSIS — Z9181 History of falling: Secondary | ICD-10-CM

## 2017-04-03 DIAGNOSIS — R262 Difficulty in walking, not elsewhere classified: Secondary | ICD-10-CM

## 2017-04-03 NOTE — Therapy (Signed)
[facility] [address] [place], [address] Phone: [phone]   Fax:  [phone]  Physical Therapy Treatment  Patient Details  Name: Kevin [name] MRN: [id number] Date of Birth: 1925/09/05 Referring Provider: Deborra Medina, MD  Encounter Date: 04/03/2017      PT End of Session - 04/03/17 1506    Visit Number 11   Number of Visits 21   Date for PT Re-Evaluation 05/02/17   Authorization Type 3   Authorization Time Period of 10   PT Start Time 1506   PT Stop Time 0254   PT Time Calculation (min) 42 min   Activity Tolerance Patient tolerated treatment well   Behavior During Therapy [facility] for tasks assessed/performed      Past Medical History:  Diagnosis Date  . Atrial fibrillation ([place])   . Cancer ([place])    melanoma- right ear  . Chronic headaches started age 85  . GERD (gastroesophageal reflux disease)   . Glaucoma   . History of mitral valve replacement with mechanical valve   . Hypertension   . Osteoporosis    secondary to low testoerone  . Sciatica   . Sciatica     Past Surgical History:  Procedure Laterality Date  . CARDIOVERSION  2013  . CATARACT EXTRACTION, BILATERAL     x 2  . HERNIA REPAIR     x 2  . MELANOMA EXCISION     right ear  . MITRAL VALVE REPLACEMENT  1994  . NASAL SEPTUM SURGERY  1963  . PERMANENT PACEMAKER INSERTION Left 12/22/2014    There were no vitals filed for this visit.      Subjective Assessment - 04/03/17 1510    Subjective Pt states having a bad time for several days. The R thigh has been very painful and has had a hard time sleeping for 2 nights (Monday and Tuesday, woke up at 3 am and stayed awake for 2 hours).  Feels like part of it is due to sitting on a cushion and squeezing his rear end muscles might be contributing to it.  8/10 R thigh currently. Walking is unpleasant. For the most part, his legs (bilateral lateral legs is normal; not terribly  uncomfortable).  10/10 R thigh pain at worst (yesterday after sitting for 2 hours, sitting on a pillow under L hip). Took a shower and acetaminophen which helped.  Uses a hot pack which slowly eases it off.  Wanted to use th jaccuzi at his assisted living facility but the distance to walk from the dressing room to the jaccuzi is too far for him.    Pertinent History Pt states having R lateral thigh pain which is his main trouble which comes from scoliosis, irriating his nerve coming out of the 5th vertebra. Has been having pain since 4 years ago, gradual onset. Goes to an osteopath in [place], was referred to PT. Had PT for 2 years in [place] until his therapist gave up. Treatments included stretching his R side, bridging, posterior pelvic tilting with deep breaths and exhaling, supine trunk rotation. Pt was told that the exercises will not help him but might keep him from deteriorating further. Feels like his condition is worse currently. Does his HEP 2x/day regularly.  Has been using a SPC for 2 years.   Patient Stated Goals I'd like to walk without a cane or walker, without pain, have more endurance.    Currently in Pain? Yes  Pain Score 8    Pain Onset More than a month ago            [facility] PT Assessment - 04/03/17 1823      Observation/Other Assessments   Lower Extremity Functional Scale  25/80                             PT Education - 04/03/17 1824    Education provided Yes   Education Details ther-ex, HEP, plan of care   Person(s) Educated Patient   Methods Explanation;Demonstration;Tactile cues;Verbal cues;Handout   Comprehension Verbalized understanding;Returned demonstration        Objectives   Manual therapy   Gentle STM to L lateral thigh in sitting.   TTP R mid lateral thigh, muscle knot palpated. Increased tenderness after gentle soft tissue     There-ex  Vitals obtained. Blood pressure L arm sitting, mechanically taken: 138/65  HR 78  Pt was recommended to decrease the thickness of the towel (uses 2 towels of about 1.5 inches at home) or pillow when sitting on it at home. Pt to play around with the thickness to find a comfortable amount because the towels or pillow that he uses at home might be too high. Pt verbalized understanding   Seated gentle manually resisted R knee flexion 5x3 Seated R knee flexion AAROM with small ball assist 10x   decreased R lateral thigh pain to 3/10  Seated glute max squeeze 5x2 with 5 second holds  Increased R lateral thigh symptoms Seated R heel slides with foot on pillow case 5x2  Slight increase in symptoms  Seated R knee flexion AAROM with small ball assist 5x2   No change in symptoms this time  Reviewed plan of care: continue a few more weeks then reassess how patient is doing.    Improved exercise technique, movement at target joints, use of target muscles after min to mod verbal, visual, tactile cues.   Reproduction of R lateral thigh pain with palpation to R vastus lateralis muscle area. Muscle knot palpated. Symptoms however did not decrease with gentle manual therapy to help decrease tension. Slight decrease in symptoms initially with gentle R knee flexion and extension movement using a small ball but decreased symptoms did not last. Similar function since initial evaluation based on his LEFS scores. Pt however able to ambulate a longer distance on 03/25/2017, being able to walk up to 400 ft with his SPC (better than his 222 ft at initial evaluation). Pt still demonstrates R lateral thigh pain, occasional bilateral lateral leg symptoms, weakness, and difficulty performing functional tasks such as walking and would benefit from continued skilled physical therapy services to address the aforementioned deficits. Challenges to progress include chronicity of pain and scoliosis.           PT Long Term Goals - 04/03/17 1828      PT LONG TERM GOAL #1   Title Patient will be able  to ambulate at least 350 ft with SPC on R without LOB and mod I to promote mobility.    Baseline Pt able to ambulate 222 ft with SPC on R, CGA to SBA (02/20/2017); 400 ft with SPC (03/25/2017)   Time 6   Period Weeks   Status Achieved     PT LONG TERM GOAL #2   Title Patient will improve LEFS score by at least 9 points as a demonstration of improved function.    Baseline 24/80 (  02/20/2017); 25/80 (04/03/2017)   Time 4   Period Weeks   Status On-going     PT LONG TERM GOAL #3   Title Patient will have a decrease in R lateral thigh pain to 5/10 or less at worst to promote ability to stand and walk.    Baseline 10/10 R lateral hip pain at worst (02/20/2017); 9-10/10 R lateral thigh pain at worst for the past 7 days (03/25/2017); 10/10 at worst for the past 7 days (04/03/2017)   Time 4   Period Weeks   Status On-going     PT LONG TERM GOAL #4   Title Patient will be able to turn 360 degrees safely and independently without LOB to promote ability to perform standing tasks.    Baseline Able to turn 360 degrees to the R but felt dizziness at the end and needed assist to maintain balance (02/20/2017)   Time 6   Period Weeks   Status Deferred     PT LONG TERM GOAL #5   Title Patient will be able to ambulate at least 500 ft with SPC on R without LOB and mod I to promote mobility.    Baseline 400 ft with SPC (03/25/2017)   Time 6   Period Weeks   Status On-going               Plan - 04/03/17 1825    Clinical Impression Statement Reproduction of R lateral thigh pain with palpation to R vastus lateralis muscle area. Muscle knot palpated. Symptoms however did not decrease with gentle manual therapy to help decrease tension. Slight decrease in symptoms initially with gentle R knee flexion and extension movement using a small ball but decreased symptoms did not last. Similar function since initial evaluation based on his LEFS scores. Pt however able to ambulate a longer distance on 03/25/2017,  being able to walk up to 400 ft with his SPC (better than his 222 ft at initial evaluation). Pt still demonstrates R lateral thigh pain, occasional bilateral lateral leg symptoms, weakness, and difficulty performing functional tasks such as walking and would benefit from continued skilled physical therapy services to address the aforementioned deficits. Challenges to progress include chronicity of pain and scoliosis.    Rehab Potential Fair   Clinical Impairments Affecting Rehab Potential Chronicity of condition, age   PT Frequency 2x / week   PT Duration 4 weeks   PT Treatment/Interventions Therapeutic activities;Therapeutic exercise;Neuromuscular re-education;Patient/family education;Manual techniques;Dry needling;Gait training;Stair training;Functional mobility training;Aquatic Therapy   PT Next Visit Plan posture, scapular, hip strengthening, gait, balance   Consulted and Agree with Plan of Care Patient      Patient will benefit from skilled therapeutic intervention in order to improve the following deficits and impairments:  Pain, Postural dysfunction, Improper body mechanics, Decreased strength, Difficulty walking, Decreased range of motion, Decreased endurance, Decreased balance  Visit Diagnosis: Difficulty in walking, not elsewhere classified - Plan: PT plan of care cert/re-cert  Muscle weakness (generalized) - Plan: PT plan of care cert/re-cert  History of falling - Plan: PT plan of care cert/re-cert  Pain in right thigh - Plan: PT plan of care cert/re-cert     Problem List Patient Active Problem List   Diagnosis Date Noted  . Encounter for preventive health examination 03/30/2017  . Generalized muscle weakness 07/24/2016  . Major depressive disorder, recurrent episode ([place]) 01/24/2016  . Vitamin D deficiency 01/24/2016  . Fatigue 01/23/2016  . Benign essential HTN 03/04/2015  . H/O prosthetic heart valve 03/04/2015  .  Symptomatic bradycardia 02/21/2015  . Episodic  lightheadedness 02/21/2015  . Exertional dyspnea 06/25/2014  . Cardiomyopathy ([place]) 03/09/2014  . Second degree AV block, Mobitz type I 11/20/2013  . Secondary cardiomyopathy ([place]) 11/18/2013  . Hyperlipidemia 04/27/2013  . Sciatica of right side 03/16/2013  . Other and unspecified hyperlipidemia 03/16/2013  . Anemia, iron deficiency 09/15/2012  . Osteoporosis, senile 09/15/2012  . Atrial flutter ([place]) 06/26/2012  . Depression 12/24/2011  . Hypertension 08/25/2011  . Neuropathy of lower extremity 08/25/2011  . Bradycardia 08/25/2011  . Atrial fibrillation ([place])   . History of mitral valve replacement with mechanical valve   . Long term current use of anticoagulant therapy 08/22/2011   Joneen Boers PT, DPT   04/03/2017, 6:49 PM  [facility] [address] [place], [address] Phone: [phone]   Fax:  [phone]  Name: Kevin [name] MRN: [id number] Date of Birth: [birth year]

## 2017-04-03 NOTE — Patient Instructions (Addendum)
   You do not have to sit on a pillow   Sitting on a chair and your right foot is on a pillow case:    Gently slide your right foot towards you to bend your leg comfortably   Repeat 5 times    Perform 3 times daily.     If this bothers you, ease off.

## 2017-04-08 ENCOUNTER — Ambulatory Visit: Payer: Medicare Other

## 2017-04-08 DIAGNOSIS — R262 Difficulty in walking, not elsewhere classified: Secondary | ICD-10-CM

## 2017-04-08 DIAGNOSIS — M79651 Pain in right thigh: Secondary | ICD-10-CM | POA: Diagnosis not present

## 2017-04-08 DIAGNOSIS — Z9181 History of falling: Secondary | ICD-10-CM

## 2017-04-08 DIAGNOSIS — M6281 Muscle weakness (generalized): Secondary | ICD-10-CM

## 2017-04-08 NOTE — Therapy (Signed)
[facility] [address] [place], [address] Phone: [phone]   Fax:  [phone]  Physical Therapy Treatment  Patient Details  Name: Kevin [name] MRN: [id number] Date of Birth: 01/22/1925 Referring Provider: Deborra Medina, MD  Encounter Date: 04/08/2017      PT End of Session - 04/08/80 1051    Visit Number 12   Number of Visits 21   Date for PT Re-Evaluation 05/02/17   Authorization Type 4   Authorization Time Period of 10   PT Start Time 7564   PT Stop Time 1136   PT Time Calculation (min) 45 min   Activity Tolerance Patient tolerated treatment well   Behavior During Therapy [facility] for tasks assessed/performed      Past Medical History:  Diagnosis Date  . Atrial fibrillation ([place])   . Cancer ([place])    melanoma- right ear  . Chronic headaches started age 32  . GERD (gastroesophageal reflux disease)   . Glaucoma   . History of mitral valve replacement with mechanical valve   . Hypertension   . Osteoporosis    secondary to low testoerone  . Sciatica   . Sciatica     Past Surgical History:  Procedure Laterality Date  . CARDIOVERSION  2013  . CATARACT EXTRACTION, BILATERAL     x 2  . HERNIA REPAIR     x 2  . MELANOMA EXCISION     right ear  . MITRAL VALVE REPLACEMENT  1994  . NASAL SEPTUM SURGERY  1963  . PERMANENT PACEMAKER INSERTION Left 12/22/2014    There were no vitals filed for this visit.      Subjective Assessment - 04/08/17 1055    Subjective R thigh is not so well. Woke up at 3 am in the morning and it was aching very badly. Took acetamenophen around 7 or 8 am which eased it off a little bit. Still painful. Pt thinks he woke up on his L side.  No pain sitting, 8/10 when walking.    Pertinent History Pt states having R lateral thigh pain which is his main trouble which comes from scoliosis, irriating his nerve coming out of the 5th vertebra. Has been having pain since 4 years  ago, gradual onset. Goes to an osteopath in [place], was referred to PT. Had PT for 2 years in [place] until his therapist gave up. Treatments included stretching his R side, bridging, posterior pelvic tilting with deep breaths and exhaling, supine trunk rotation. Pt was told that the exercises will not help him but might keep him from deteriorating further. Feels like his condition is worse currently. Does his HEP 2x/day regularly.  Has been using a SPC for 2 years.   Patient Stated Goals I'd like to walk without a cane or walker, without pain, have more endurance.    Currently in Pain? Yes   Pain Score 8   when walking   Pain Onset More than a month ago                                 PT Education - 04/08/17 1127    Education provided Yes   Education Details ther-ex   [employer]) Educated Patient   Methods Explanation;Demonstration;Tactile cues;Verbal cues   Comprehension Returned demonstration;Verbalized understanding        Objectives    There-ex  Vitals obtained. Blood pressure  L arm sitting, mechanically taken: 124/67HR 82  Seated with L foot propped onto 3 inch step, trunk slight L rotation position, R hand on vertical pole for more upright posture and gentle shoulder extension: breathing and deep breaths.   Gentle posterior to anterior, suprerior and medial pressure to L lumbar hump provided by PT to promote posture.   Minimal to no R lateral thigh pain with gait about 40 ft aftewards using his SPC  Pt instruction to sit with L foot propped on a dictionary at home to promote posture instead of sitting on a pillow.    Standing with R hand on pole with gentle shoulder extension, PT providing gentle posterior to anterior pressure, medial, and superior pressure to L lumbar hump: breathing and deep breaths.   Improved standing posture, no R lateral thigh symptoms during standing exercise.   Aforementioned breathing exercises performed  multiple times, no number of repetitions provided.   Nustep seat 7, arms 7 level 1 x 3 min. Cues for proper technique and pacing himself.   Rest breaks provided during exercises as needed secondary to fatigue and allowing muscles time to recover.     Try standing and breathing with R hand gently pushing down with cane next visit.    Improved exercise technique, movement at target joints, use of target muscles after mod verbal, visual, tactile cues.      Worked on Schroth related exercises for scoliosis to decrease back pressure, and use breathing techniques in both seated and standing to help improve posture and hopefully decrease R lateral thigh symptoms. No increase in R lateral thigh pain during the sitting and standing breathing postural exercises. Decreased to no R lateral thigh symptoms with gait with SPC about 40 ft after sitting breathing exercises.             PT Long Term Goals - 04/03/17 1828      PT LONG TERM GOAL #1   Title Patient will be able to ambulate at least 350 ft with SPC on R without LOB and mod I to promote mobility.    Baseline Pt able to ambulate 222 ft with SPC on R, CGA to SBA (02/20/2017); 400 ft with SPC (03/25/2017)   Time 6   Period Weeks   Status Achieved     PT LONG TERM GOAL #2   Title Patient will improve LEFS score by at least 9 points as a demonstration of improved function.    Baseline 24/80 (02/20/2017); 25/80 (04/03/2017)   Time 4   Period Weeks   Status On-going     PT LONG TERM GOAL #3   Title Patient will have a decrease in R lateral thigh pain to 5/10 or less at worst to promote ability to stand and walk.    Baseline 10/10 R lateral hip pain at worst (02/20/2017); 9-10/10 R lateral thigh pain at worst for the past 7 days (03/25/2017); 10/10 at worst for the past 7 days (04/03/2017)   Time 4   Period Weeks   Status On-going     PT LONG TERM GOAL #4   Title Patient will be able to turn 360 degrees safely and independently without  LOB to promote ability to perform standing tasks.    Baseline Able to turn 360 degrees to the R but felt dizziness at the end and needed assist to maintain balance (02/20/2017)   Time 6   Period Weeks   Status Deferred     PT LONG TERM GOAL #5  Title Patient will be able to ambulate at least 500 ft with SPC on R without LOB and mod I to promote mobility.    Baseline 400 ft with SPC (03/25/2017)   Time 6   Period Weeks   Status On-going               Plan - 04/08/17 1129    Clinical Impression Statement Worked on Schroth related exercises for scoliosis to decrease back pressure, and use breathing techniques in both seated and standing to help improve posture and hopefully decrease R lateral thigh symptoms. No increase in R lateral thigh pain during the sitting and standing breathing postural exercises. Decreased to no R lateral thigh symptoms with gait with SPC about 40 ft after sitting breathing exercises.    Rehab Potential Fair   Clinical Impairments Affecting Rehab Potential Chronicity of condition, age   PT Frequency 2x / week   PT Duration 4 weeks   PT Treatment/Interventions Therapeutic activities;Therapeutic exercise;Neuromuscular re-education;Patient/family education;Manual techniques;Dry needling;Gait training;Stair training;Functional mobility training;Aquatic Therapy   PT Next Visit Plan posture, scapular, hip strengthening, gait, balance   Consulted and Agree with Plan of Care Patient      Patient will benefit from skilled therapeutic intervention in order to improve the following deficits and impairments:  Pain, Postural dysfunction, Improper body mechanics, Decreased strength, Difficulty walking, Decreased range of motion, Decreased endurance, Decreased balance  Visit Diagnosis: Pain in right thigh  Difficulty in walking, not elsewhere classified  Muscle weakness (generalized)  History of falling     Problem List Patient Active Problem List   Diagnosis  Date Noted  . Encounter for preventive health examination 03/30/2017  . Generalized muscle weakness 07/24/2016  . Major depressive disorder, recurrent episode ([place]) 01/24/2016  . Vitamin D deficiency 01/24/2016  . Fatigue 01/23/2016  . Benign essential HTN 03/04/2015  . H/O prosthetic heart valve 03/04/2015  . Symptomatic bradycardia 02/21/2015  . Episodic lightheadedness 02/21/2015  . Exertional dyspnea 06/25/2014  . Cardiomyopathy ([place]) 03/09/2014  . Second degree AV block, Mobitz type I 11/20/2013  . Secondary cardiomyopathy ([place]) 11/18/2013  . Hyperlipidemia 04/27/2013  . Sciatica of right side 03/16/2013  . Other and unspecified hyperlipidemia 03/16/2013  . Anemia, iron deficiency 09/15/2012  . Osteoporosis, senile 09/15/2012  . Atrial flutter ([place]) 06/26/2012  . Depression 12/24/2011  . Hypertension 08/25/2011  . Neuropathy of lower extremity 08/25/2011  . Bradycardia 08/25/2011  . Atrial fibrillation ([place])   . History of mitral valve replacement with mechanical valve   . Long term current use of anticoagulant therapy 08/22/2011    Joneen Boers PT, DPT   04/08/2017, 11:58 AM  [facility] [address], [place], [address] Phone: [phone]   Fax:  [phone]  Name: CHESKEL SILVERIO MRN: [id number] Date of Birth: November 19, 1925

## 2017-04-08 NOTE — Patient Instructions (Signed)
  Instead of sitting with a pillow under your L hip,    Prop your left foot onto your dictionary.    Make sure the dictionary is out of the way when you get up and walk so you do not trip on it for safety.

## 2017-04-10 ENCOUNTER — Ambulatory Visit: Payer: Medicare Other | Attending: Internal Medicine

## 2017-04-10 DIAGNOSIS — M79651 Pain in right thigh: Secondary | ICD-10-CM | POA: Diagnosis not present

## 2017-04-10 DIAGNOSIS — Z9181 History of falling: Secondary | ICD-10-CM | POA: Insufficient documentation

## 2017-04-10 DIAGNOSIS — M6281 Muscle weakness (generalized): Secondary | ICD-10-CM | POA: Insufficient documentation

## 2017-04-10 DIAGNOSIS — R262 Difficulty in walking, not elsewhere classified: Secondary | ICD-10-CM | POA: Diagnosis present

## 2017-04-10 NOTE — Therapy (Signed)
[facility] [address] [place], [address] Phone: [phone]   Fax:  [phone]  Physical Therapy Treatment  Patient Details  Name: Kevin [name] MRN: [id number] Date of Birth: 16-Jul-1925 Referring Provider: Deborra Medina, MD  Encounter Date: 04/10/2017      PT End of Session - 04/10/17 1046    Visit Number 13   Number of Visits 21   Date for PT Re-Evaluation 05/02/17   Authorization Type 5   Authorization Time Period of 10   PT Start Time 1046   PT Stop Time 1131   PT Time Calculation (min) 45 min   Activity Tolerance Patient tolerated treatment well   Behavior During Therapy [facility] for tasks assessed/performed      Past Medical History:  Diagnosis Date  . Atrial fibrillation ([place])   . Cancer ([place])    melanoma- right ear  . Chronic headaches started age 44  . GERD (gastroesophageal reflux disease)   . Glaucoma   . History of mitral valve replacement with mechanical valve   . Hypertension   . Osteoporosis    secondary to low testoerone  . Sciatica   . Sciatica     Past Surgical History:  Procedure Laterality Date  . CARDIOVERSION  2013  . CATARACT EXTRACTION, BILATERAL     x 2  . HERNIA REPAIR     x 2  . MELANOMA EXCISION     right ear  . MITRAL VALVE REPLACEMENT  1994  . NASAL SEPTUM SURGERY  1963  . PERMANENT PACEMAKER INSERTION Left 12/22/2014    There were no vitals filed for this visit.      Subjective Assessment - 04/10/17 1050    Subjective R thigh is a little better than yesterday. Yesterday was a terrible day for some reason. 15/10 yesterday after doing the [facility] exercises. Has never happened before.  Took a hot shower which did not help. Took acetamenophen which did not help. Pt felt normal after Monday's session.  6-7/10 R lateral thigh pain currently which is normal for him.  Pt states that he propped his L foot up onto his dictionary sitting for 2 hours yesterday  which was uncomfortable.    Pertinent History Pt states having R lateral thigh pain which is his main trouble which comes from scoliosis, irriating his nerve coming out of the 5th vertebra. Has been having pain since 4 years ago, gradual onset. Goes to an osteopath in [place], was referred to PT. Had PT for 2 years in [place] until his therapist gave up. Treatments included stretching his R side, bridging, posterior pelvic tilting with deep breaths and exhaling, supine trunk rotation. Pt was told that the exercises will not help him but might keep him from deteriorating further. Feels like his condition is worse currently. Does his HEP 2x/day regularly.  Has been using a SPC for 2 years.   Patient Stated Goals I'd like to walk without a cane or walker, without pain, have more endurance.    Currently in Pain? Yes   Pain Score 7   6-7/10   Pain Onset More than a month ago                                 PT Education - 04/10/17 1117    Education provided Yes   Education Details ther-ex   [employer]) Educated Patient  Methods Explanation;Demonstration;Tactile cues;Verbal cues   Comprehension Returned demonstration;Verbalized understanding        Objectives    There-ex  Vitals obtained. Blood pressure L arm sitting, mechanically taken: 12/82HR 79  Pt was recommended to only prop his L foot up for only 1 minute at a time because 2 hours is too long. Pt verbalized understanding.   Sitting on a chair with back rest: 6-7/10 R lateral thigh pain Sitting on a mat table without back rest: no R lateral thigh pain Sitting on a chair with back rest again to 8/10: increased but pt stated that it increased the longer he sat with back unsupported at mat table Sitting on a mat table without back rest again: Decreased R lateral thigh pain to 5-6/10  Seated naval ins 10x5 seconds. R lateral thigh feels numb  Seated with L foot propped onto 3 inch step, trunk  slight L rotation position, R hand on vertical pole for more upright posture and gentle shoulder extension: breathing and deep breaths.              Gentle posterior to anterior, suprerior and medial pressure to L lumbar hump provided by PT to promote posture.   Slight increase in R lateral thigh symptoms today    Seated side weight shifts 5x3 each side to promote posture. Increased symptoms  Seated R weight shifts 5x3. No change.   Seated gentle manual resistance to L trunk 5 seconds x 10 for 2 sets to promote use of L trunk muscles and gently take off R side bending pressure. Decreased R lateral thigh symptoms, slight L lateral thigh symptoms.     Improved exercise technique, movement at target joints, use of target muscles after min to mod verbal, visual, tactile cues.    Slight decrease in symptoms with sitting without back support. Slight decrease in R lateral thigh symptoms with gentle activation of L trunk muscles to gently decrease R side bending pressure. Cues needed for small gentle movements of short duration since larger movements and longer duration tend to increase discomfort.           PT Long Term Goals - 04/03/17 1828      PT LONG TERM GOAL #1   Title Patient will be able to ambulate at least 350 ft with SPC on R without LOB and mod I to promote mobility.    Baseline Pt able to ambulate 222 ft with SPC on R, CGA to SBA (02/20/2017); 400 ft with SPC (03/25/2017)   Time 6   Period Weeks   Status Achieved     PT LONG TERM GOAL #2   Title Patient will improve LEFS score by at least 9 points as a demonstration of improved function.    Baseline 24/80 (02/20/2017); 25/80 (04/03/2017)   Time 4   Period Weeks   Status On-going     PT LONG TERM GOAL #3   Title Patient will have a decrease in R lateral thigh pain to 5/10 or less at worst to promote ability to stand and walk.    Baseline 10/10 R lateral hip pain at worst (02/20/2017); 9-10/10 R lateral thigh pain at worst for  the past 7 days (03/25/2017); 10/10 at worst for the past 7 days (04/03/2017)   Time 4   Period Weeks   Status On-going     PT LONG TERM GOAL #4   Title Patient will be able to turn 360 degrees safely and independently without LOB to promote ability  to perform standing tasks.    Baseline Able to turn 360 degrees to the R but felt dizziness at the end and needed assist to maintain balance (02/20/2017)   Time 6   Period Weeks   Status Deferred     PT LONG TERM GOAL #5   Title Patient will be able to ambulate at least 500 ft with SPC on R without LOB and mod I to promote mobility.    Baseline 400 ft with SPC (03/25/2017)   Time 6   Period Weeks   Status On-going               Plan - 04/10/17 1118    Clinical Impression Statement Slight decrease in symptoms with sitting without back support. Slight decrease in R lateral thigh symptoms with gentle activation of L trunk muscles to gently decrease R side bending pressure. Cues needed for small gentle movements of short duration since larger movements and longer duration tend to increase discomfort.    Rehab Potential Fair   Clinical Impairments Affecting Rehab Potential Chronicity of condition, age   PT Frequency 2x / week   PT Duration 4 weeks   PT Treatment/Interventions Therapeutic activities;Therapeutic exercise;Neuromuscular re-education;Patient/family education;Manual techniques;Dry needling;Gait training;Stair training;Functional mobility training;Aquatic Therapy   PT Next Visit Plan posture, scapular, hip strengthening, gait, balance   Consulted and Agree with Plan of Care Patient      Patient will benefit from skilled therapeutic intervention in order to improve the following deficits and impairments:  Pain, Postural dysfunction, Improper body mechanics, Decreased strength, Difficulty walking, Decreased range of motion, Decreased endurance, Decreased balance  Visit Diagnosis: Pain in right thigh  Difficulty in walking, not  elsewhere classified  Muscle weakness (generalized)     Problem List Patient Active Problem List   Diagnosis Date Noted  . Encounter for preventive health examination 03/30/2017  . Generalized muscle weakness 07/24/2016  . Major depressive disorder, recurrent episode ([place]) 01/24/2016  . Vitamin D deficiency 01/24/2016  . Fatigue 01/23/2016  . Benign essential HTN 03/04/2015  . H/O prosthetic heart valve 03/04/2015  . Symptomatic bradycardia 02/21/2015  . Episodic lightheadedness 02/21/2015  . Exertional dyspnea 06/25/2014  . Cardiomyopathy ([place]) 03/09/2014  . Second degree AV block, Mobitz type I 11/20/2013  . Secondary cardiomyopathy ([place]) 11/18/2013  . Hyperlipidemia 04/27/2013  . Sciatica of right side 03/16/2013  . Other and unspecified hyperlipidemia 03/16/2013  . Anemia, iron deficiency 09/15/2012  . Osteoporosis, senile 09/15/2012  . Atrial flutter ([place]) 06/26/2012  . Depression 12/24/2011  . Hypertension 08/25/2011  . Neuropathy of lower extremity 08/25/2011  . Bradycardia 08/25/2011  . Atrial fibrillation ([place])   . History of mitral valve replacement with mechanical valve   . Long term current use of anticoagulant therapy 08/22/2011    Joneen Boers PT, DPT   04/10/2017, 3:19 PM  [place] [address] [place], [address] Phone: [phone]   Fax:  [phone]  Name: Kevin [name] MRN: [id number] Date of Birth: 05/26/1925

## 2017-04-10 NOTE — Patient Instructions (Addendum)
  Only prop your left foot up 1 minute at a time when sitting.       Sitting on your chair or bed, gently tilt to the left side (SMALL movement) holding it for 5 seconds at a time.    Repeat 5 times.    Perform 3 sessions per day.

## 2017-04-11 ENCOUNTER — Other Ambulatory Visit: Payer: Self-pay | Admitting: Internal Medicine

## 2017-04-15 ENCOUNTER — Ambulatory Visit: Payer: Medicare Other

## 2017-04-15 DIAGNOSIS — M6281 Muscle weakness (generalized): Secondary | ICD-10-CM

## 2017-04-15 DIAGNOSIS — M79651 Pain in right thigh: Secondary | ICD-10-CM | POA: Diagnosis not present

## 2017-04-15 DIAGNOSIS — R262 Difficulty in walking, not elsewhere classified: Secondary | ICD-10-CM

## 2017-04-15 NOTE — Therapy (Signed)
[facility] [facility] [address], [place], [address] Phone: [phone]   Fax:  [phone]  Physical Therapy Treatment  Patient Details  Name: Kevin [name] MRN: [id number] Date of Birth: 1925-05-23 Referring Provider: Deborra Medina, MD  Encounter Date: 04/15/2017      PT End of Session - 04/15/17 0920    Visit Number 14   Number of Visits 21   Date for PT Re-Evaluation 05/02/17   Authorization Type 6   Authorization Time Period of 10   PT Start Time 0921   PT Stop Time 1008   PT Time Calculation (min) 47 min   Activity Tolerance Patient tolerated treatment well   Behavior During Therapy [facility] for tasks assessed/performed      Past Medical History:  Diagnosis Date  . Atrial fibrillation ([place])   . Cancer ([place])    melanoma- right ear  . Chronic headaches started age 89  . GERD (gastroesophageal reflux disease)   . Glaucoma   . History of mitral valve replacement with mechanical valve   . Hypertension   . Osteoporosis    secondary to low testoerone  . Sciatica   . Sciatica     Past Surgical History:  Procedure Laterality Date  . CARDIOVERSION  2013  . CATARACT EXTRACTION, BILATERAL     x 2  . HERNIA REPAIR     x 2  . MELANOMA EXCISION     right ear  . MITRAL VALVE REPLACEMENT  1994  . NASAL SEPTUM SURGERY  1963  . PERMANENT PACEMAKER INSERTION Left 12/22/2014    There were no vitals filed for this visit.      Subjective Assessment - 04/15/17 0925    Subjective R side is about the same. 5/10 R lateral thigh currently. Has a headache and took acetamenophen at 8 am.    Pertinent History Pt states having R lateral thigh pain which is his main trouble which comes from scoliosis, irriating his nerve coming out of the 5th vertebra. Has been having pain since 4 years ago, gradual onset. Goes to an osteopath in [place], was referred to PT. Had PT for 2 years in [place] until his therapist gave up.  Treatments included stretching his R side, bridging, posterior pelvic tilting with deep breaths and exhaling, supine trunk rotation. Pt was told that the exercises will not help him but might keep him from deteriorating further. Feels like his condition is worse currently. Does his HEP 2x/day regularly.  Has been using a SPC for 2 years.   Patient Stated Goals I'd like to walk without a cane or walker, without pain, have more endurance.    Currently in Pain? Yes   Pain Score 5    Pain Onset More than a month ago                                 PT Education - 04/15/17 0945    Education provided Yes   Education Details ther-ex   [employer]) Educated Patient   Methods Explanation;Demonstration;Tactile cues;Verbal cues   Comprehension Returned demonstration;Verbalized understanding        Objectives    There-ex  Vitals obtained. Blood pressure L arm sitting, mechanically taken: 114/82HR 72  Seated gentle manual resistance to L trunk 5 seconds x 10 for 3 sets to promote use of L trunk muscles and gently take off R side  bending pressure.   Seated gentle manual perturbation front and back with PT holding onto PVC bar 30 seconds x 3 to promote trunk control   Then side to side 30 seconds x 3  Then side bending direction 30 seconds x 3    Decreased R lateral thigh pain in sitting  Seated L shoulder adduction isometrics, with L hand on table 10x3 with 5 seconds gentle by pt  Increased symptoms in R lateral thigh to 10/10 (signs and symptoms do not match pain level provided)   Sitting on a chair with back rest. No change is symptoms     Sitting on mat table with back unsupported: decreased R lateral thigh symptoms to 5/10      Nustep seat 7, arms 7 at level 1 x 2.5 min (no charge) to promote endurance. Pt states R lateral thigh feeling pretty good afterwards.     Improved exercise technique, movement at target joints, use of target muscles after mod  verbal, visual, tactile cues.    Decreased R lateral thigh pain in sitting after exercises to gently promote trunk strengthening (with PT manual resistance) and use of the Nustep. Increased symptoms when pt tries to perform trunk muscle activation exercises (seated L shoulder adduction isometrics) himself.       PT Long Term Goals - 04/03/17 1828      PT LONG TERM GOAL #1   Title Patient will be able to ambulate at least 350 ft with SPC on R without LOB and mod I to promote mobility.    Baseline Pt able to ambulate 222 ft with SPC on R, CGA to SBA (02/20/2017); 400 ft with SPC (03/25/2017)   Time 6   Period Weeks   Status Achieved     PT LONG TERM GOAL #2   Title Patient will improve LEFS score by at least 9 points as a demonstration of improved function.    Baseline 24/80 (02/20/2017); 25/80 (04/03/2017)   Time 4   Period Weeks   Status On-going     PT LONG TERM GOAL #3   Title Patient will have a decrease in R lateral thigh pain to 5/10 or less at worst to promote ability to stand and walk.    Baseline 10/10 R lateral hip pain at worst (02/20/2017); 9-10/10 R lateral thigh pain at worst for the past 7 days (03/25/2017); 10/10 at worst for the past 7 days (04/03/2017)   Time 4   Period Weeks   Status On-going     PT LONG TERM GOAL #4   Title Patient will be able to turn 360 degrees safely and independently without LOB to promote ability to perform standing tasks.    Baseline Able to turn 360 degrees to the R but felt dizziness at the end and needed assist to maintain balance (02/20/2017)   Time 6   Period Weeks   Status Deferred     PT LONG TERM GOAL #5   Title Patient will be able to ambulate at least 500 ft with SPC on R without LOB and mod I to promote mobility.    Baseline 400 ft with SPC (03/25/2017)   Time 6   Period Weeks   Status On-going               Plan - 04/15/17 1011    Clinical Impression Statement Decreased R lateral thigh pain in sitting after exercises  to gently promote trunk strengthening (with PT manual resistance) and use of the Nustep.  Increased symptoms when pt tries to perform trunk muscle activation exercises (seated L shoulder adduction isometrics) himself.    Rehab Potential Fair   Clinical Impairments Affecting Rehab Potential Chronicity of condition, age   PT Frequency 2x / week   PT Duration 4 weeks   PT Treatment/Interventions Therapeutic activities;Therapeutic exercise;Neuromuscular re-education;Patient/family education;Manual techniques;Dry needling;Gait training;Stair training;Functional mobility training;Aquatic Therapy   PT Next Visit Plan posture, scapular, hip strengthening, gait, balance   Consulted and Agree with Plan of Care Patient      Patient will benefit from skilled therapeutic intervention in order to improve the following deficits and impairments:  Pain, Postural dysfunction, Improper body mechanics, Decreased strength, Difficulty walking, Decreased range of motion, Decreased endurance, Decreased balance  Visit Diagnosis: Pain in right thigh  Difficulty in walking, not elsewhere classified  Muscle weakness (generalized)     Problem List Patient Active Problem List   Diagnosis Date Noted  . Encounter for preventive health examination 03/30/2017  . Generalized muscle weakness 07/24/2016  . Major depressive disorder, recurrent episode ([place]) 01/24/2016  . Vitamin D deficiency 01/24/2016  . Fatigue 01/23/2016  . Benign essential HTN 03/04/2015  . H/O prosthetic heart valve 03/04/2015  . Symptomatic bradycardia 02/21/2015  . Episodic lightheadedness 02/21/2015  . Exertional dyspnea 06/25/2014  . Cardiomyopathy ([place]) 03/09/2014  . Second degree AV block, Mobitz type I 11/20/2013  . Secondary cardiomyopathy ([place]) 11/18/2013  . Hyperlipidemia 04/27/2013  . Sciatica of right side 03/16/2013  . Other and unspecified hyperlipidemia 03/16/2013  . Anemia, iron deficiency 09/15/2012  . Osteoporosis, senile  09/15/2012  . Atrial flutter ([place]) 06/26/2012  . Depression 12/24/2011  . Hypertension 08/25/2011  . Neuropathy of lower extremity 08/25/2011  . Bradycardia 08/25/2011  . Atrial fibrillation ([place])   . History of mitral valve replacement with mechanical valve   . Long term current use of anticoagulant therapy 08/22/2011    Joneen Boers PT, DPT   04/15/2017, 10:21 AM  [facility] [address] [place], [address] Phone: [phone]   Fax:  [phone]  Name: Kevin [name] MRN: [id number] Date of Birth: 1925/07/27

## 2017-04-17 ENCOUNTER — Ambulatory Visit: Payer: Medicare Other

## 2017-04-17 DIAGNOSIS — R262 Difficulty in walking, not elsewhere classified: Secondary | ICD-10-CM

## 2017-04-17 DIAGNOSIS — M79651 Pain in right thigh: Secondary | ICD-10-CM

## 2017-04-17 DIAGNOSIS — M6281 Muscle weakness (generalized): Secondary | ICD-10-CM

## 2017-04-17 NOTE — Therapy (Signed)
[facility] [address], [place], [address] Phone: [phone]   Fax:  [phone]  Physical Therapy Treatment  Patient Details  Name: Kevin [name] MRN: [id number] Date of Birth: 12-Apr-1925 Referring Provider: Deborra Medina, MD  Encounter Date: 04/17/2017      PT End of Session - 04/17/17 1117    Visit Number 15   Number of Visits 21   Date for PT Re-Evaluation 05/02/17   Authorization Type 7   Authorization Time Period of 10   PT Start Time 1117   PT Stop Time 1153   PT Time Calculation (min) 36 min   Activity Tolerance Patient tolerated treatment well   Behavior During Therapy [facility] for tasks assessed/performed      Past Medical History:  Diagnosis Date  . Atrial fibrillation ([place])   . Cancer ([place])    melanoma- right ear  . Chronic headaches started age 47  . GERD (gastroesophageal reflux disease)   . Glaucoma   . History of mitral valve replacement with mechanical valve   . Hypertension   . Osteoporosis    secondary to low testoerone  . Sciatica   . Sciatica     Past Surgical History:  Procedure Laterality Date  . CARDIOVERSION  2013  . CATARACT EXTRACTION, BILATERAL     x 2  . HERNIA REPAIR     x 2  . MELANOMA EXCISION     right ear  . MITRAL VALVE REPLACEMENT  1994  . NASAL SEPTUM SURGERY  1963  . PERMANENT PACEMAKER INSERTION Left 12/22/2014    There were no vitals filed for this visit.      Subjective Assessment - 04/17/17 1122    Subjective Pt states getting a terrible dizzy spell last week on Wednesday. Could not get out of the chair. The room felt like it was spinning. Pt pressed his emergency button, security came in, nurse took his vitals and sugar levels which were fine.  Nusre helped him go to bed. Takes meclizine which usually helps. Did not help last Wednesday night. Has had bouts of dizziness but not as bad since then. Currently takes meclizine and took one right  before PT today.  Pt got dizzy when he tried to get out of his chair last Wedensday when his dizziness occured.   9/10 R thigh pain currently.  Does not remember how his thigh was after last session.    Pertinent History Pt states having R lateral thigh pain which is his main trouble which comes from scoliosis, irriating his nerve coming out of the 5th vertebra. Has been having pain since 4 years ago, gradual onset. Goes to an osteopath in [place], was referred to PT. Had PT for 2 years in [place] until his therapist gave up. Treatments included stretching his R side, bridging, posterior pelvic tilting with deep breaths and exhaling, supine trunk rotation. Pt was told that the exercises will not help him but might keep him from deteriorating further. Feels like his condition is worse currently. Does his HEP 2x/day regularly.  Has been using a SPC for 2 years.   Patient Stated Goals I'd like to walk without a cane or walker, without pain, have more endurance.    Currently in Pain? Yes   Pain Score 9    Pain Onset More than a month ago  PT Education - 04/17/17 1120    Education provided Yes   Education Details ther-ex   [employer]) Educated Patient   Methods Explanation;Demonstration;Verbal cues;Tactile cues   Comprehension Returned demonstration;Verbalized understanding        Objectives    There-ex   Pt was recommended to contact his MD if his dizziness worsens. Pt verbalized understanding.   Vitals obtained. Blood pressure L arm sitting, mechanically taken: 120/70HR 72   Seated gentle manual perturbation front and back with PT holding onto PVC bar 30 seconds x 3 to promote trunk control              Then side to side 30 seconds x 3             Then side bending direction 30 seconds x 3                          Decreased R lateral thigh pain in sitting  Seated gentle manual resistance to L trunk 5 seconds x 10  for 3 sets to promote use of L trunk muscles and gently take off R side bending pressure.     Seated gentle L shoulder adduction isometrics, with gentle manual resistance from PT 5 with 5 seconds  Dizziness reproduced. Pt states his dizziness usually lasts for 3 minutes.    126/79, HR 71 R arm sitting, mechanically taken.    Pt took meclizine.    Aforementioned exercise discontinued.    Session stopped 11:53 am. No facial droop observed, even smile. Pt left the clinic with PT accompanying him at 12:16 pm, (mod I ambulation with SPC on R) and pt states that his dizziness feels much much better.   Improved exercise technique, movement at target joints, use of target muscles after mod verbal, visual, tactile cues.    Improved R lateral thigh pain after performing gentle isometric exercises to activate trunk muscles without back support. Pt however demonstrates reproduction of dizziness with gentle L shoulder adduction isometrics. Symptoms eased with rest and pt medication (pt took meclizine). Pt was recommended to call his MD pertaining to his dizziness. Pt verbalized understanding.            PT Long Term Goals - 04/03/17 1828      PT LONG TERM GOAL #1   Title Patient will be able to ambulate at least 350 ft with SPC on R without LOB and mod I to promote mobility.    Baseline Pt able to ambulate 222 ft with SPC on R, CGA to SBA (02/20/2017); 400 ft with SPC (03/25/2017)   Time 6   Period Weeks   Status Achieved     PT LONG TERM GOAL #2   Title Patient will improve LEFS score by at least 9 points as a demonstration of improved function.    Baseline 24/80 (02/20/2017); 25/80 (04/03/2017)   Time 4   Period Weeks   Status On-going     PT LONG TERM GOAL #3   Title Patient will have a decrease in R lateral thigh pain to 5/10 or less at worst to promote ability to stand and walk.    Baseline 10/10 R lateral hip pain at worst (02/20/2017); 9-10/10 R lateral thigh pain at worst for the  past 7 days (03/25/2017); 10/10 at worst for the past 7 days (04/03/2017)   Time 4   Period Weeks   Status On-going     PT LONG TERM GOAL #4  Title Patient will be able to turn 360 degrees safely and independently without LOB to promote ability to perform standing tasks.    Baseline Able to turn 360 degrees to the R but felt dizziness at the end and needed assist to maintain balance (02/20/2017)   Time 6   Period Weeks   Status Deferred     PT LONG TERM GOAL #5   Title Patient will be able to ambulate at least 500 ft with SPC on R without LOB and mod I to promote mobility.    Baseline 400 ft with SPC (03/25/2017)   Time 6   Period Weeks   Status On-going               Plan - 04/17/17 1141    Clinical Impression Statement Improved R lateral thigh pain after performing gentle isometric exercises to activate trunk muscles without back support. Pt however demonstrates reproduction of dizziness with gentle L shoulder adduction isometrics. Symptoms eased with rest and pt medication (pt took meclizine). Pt was recommended to call his MD pertaining to his dizziness. Pt verbalized understanding.     Rehab Potential Fair   Clinical Impairments Affecting Rehab Potential Chronicity of condition, age   PT Frequency 2x / week   PT Duration 4 weeks   PT Treatment/Interventions Therapeutic activities;Therapeutic exercise;Neuromuscular re-education;Patient/family education;Manual techniques;Dry needling;Gait training;Stair training;Functional mobility training;Aquatic Therapy   PT Next Visit Plan posture, scapular, hip strengthening, gait, balance   Consulted and Agree with Plan of Care Patient      Patient will benefit from skilled therapeutic intervention in order to improve the following deficits and impairments:  Pain, Postural dysfunction, Improper body mechanics, Decreased strength, Difficulty walking, Decreased range of motion, Decreased endurance, Decreased balance  Visit  Diagnosis: Difficulty in walking, not elsewhere classified  Muscle weakness (generalized)  Pain in right thigh     Problem List Patient Active Problem List   Diagnosis Date Noted  . Encounter for preventive health examination 03/30/2017  . Generalized muscle weakness 07/24/2016  . Major depressive disorder, recurrent episode ([place]) 01/24/2016  . Vitamin D deficiency 01/24/2016  . Fatigue 01/23/2016  . Benign essential HTN 03/04/2015  . H/O prosthetic heart valve 03/04/2015  . Symptomatic bradycardia 02/21/2015  . Episodic lightheadedness 02/21/2015  . Exertional dyspnea 06/25/2014  . Cardiomyopathy ([place]) 03/09/2014  . Second degree AV block, Mobitz type I 11/20/2013  . Secondary cardiomyopathy ([place]) 11/18/2013  . Hyperlipidemia 04/27/2013  . Sciatica of right side 03/16/2013  . Other and unspecified hyperlipidemia 03/16/2013  . Anemia, iron deficiency 09/15/2012  . Osteoporosis, senile 09/15/2012  . Atrial flutter ([place]) 06/26/2012  . Depression 12/24/2011  . Hypertension 08/25/2011  . Neuropathy of lower extremity 08/25/2011  . Bradycardia 08/25/2011  . Atrial fibrillation ([place])   . History of mitral valve replacement with mechanical valve   . Long term current use of anticoagulant therapy 08/22/2011    Joneen Boers PT, DPT   04/17/2017, 12:22 PM  [facility] [address], [place], [address] Phone: [phone]   Fax:  [phone]  Name: Kevin [name] MRN: [id number] Date of Birth: May 24, 1925

## 2017-04-23 ENCOUNTER — Ambulatory Visit: Payer: Medicare Other

## 2017-04-23 DIAGNOSIS — M79651 Pain in right thigh: Secondary | ICD-10-CM | POA: Diagnosis not present

## 2017-04-23 DIAGNOSIS — R262 Difficulty in walking, not elsewhere classified: Secondary | ICD-10-CM

## 2017-04-23 DIAGNOSIS — M6281 Muscle weakness (generalized): Secondary | ICD-10-CM

## 2017-04-23 NOTE — Therapy (Signed)
[facility] [address] [place], [address] Phone: [phone]   Fax:  [phone]  Physical Therapy Treatment  Patient Details  Name: Kevin [name] MRN: [id number] Date of Birth: 11-25-1925 Referring Provider: Deborra Medina, MD  Encounter Date: 04/23/2017      PT End of Session - 04/23/17 1034    Visit Number 16   Number of Visits 21   Date for PT Re-Evaluation 05/02/17   Authorization Type 8   Authorization Time Period of 10   PT Start Time 1036   PT Stop Time 1135   PT Time Calculation (min) 59 min   Activity Tolerance Patient tolerated treatment well   Behavior During Therapy [facility] for tasks assessed/performed      Past Medical History:  Diagnosis Date  . Atrial fibrillation ([place])   . Cancer ([place])    melanoma- right ear  . Chronic headaches started age 54  . GERD (gastroesophageal reflux disease)   . Glaucoma   . History of mitral valve replacement with mechanical valve   . Hypertension   . Osteoporosis    secondary to low testoerone  . Sciatica   . Sciatica     Past Surgical History:  Procedure Laterality Date  . CARDIOVERSION  2013  . CATARACT EXTRACTION, BILATERAL     x 2  . HERNIA REPAIR     x 2  . MELANOMA EXCISION     right ear  . MITRAL VALVE REPLACEMENT  1994  . NASAL SEPTUM SURGERY  1963  . PERMANENT PACEMAKER INSERTION Left 12/22/2014    There were no vitals filed for this visit.      Subjective Assessment - 04/23/17 1042    Subjective Pt states that his meclizine was increased to 25 mg by his heart doctor. Can take the medication 3x day if necessary.  Dizziness is about the same. Dizzy currently. Yesterday was fine. Took a pill at 9 am this morning which has improved. Not back to normal yet.  Does not know what causes his dizziness. R thigh feels like a 6/10 currently.  10/10 R thigh pain at most for the past 7 days yesterday afternoon. Does not know what brings it to a  10/10. Pt was just sitting and reading his newspaper for about 45 minutes.  Pt was sitting on a chair with a back rest.    Pertinent History Pt states having R lateral thigh pain which is his main trouble which comes from scoliosis, irriating his nerve coming out of the 5th vertebra. Has been having pain since 4 years ago, gradual onset. Goes to an osteopath in [place], was referred to PT. Had PT for 2 years in [place] until his therapist gave up. Treatments included stretching his R side, bridging, posterior pelvic tilting with deep breaths and exhaling, supine trunk rotation. Pt was told that the exercises will not help him but might keep him from deteriorating further. Feels like his condition is worse currently. Does his HEP 2x/day regularly.  Has been using a SPC for 2 years.   Patient Stated Goals I'd like to walk without a cane or walker, without pain, have more endurance.    Currently in Pain? Yes   Pain Score 6    Pain Onset More than a month ago  PT Education - 04/23/17 1049    Education provided Yes   Education Details ther-ex   [employer]) Educated Patient   Methods Explanation;Demonstration;Tactile cues;Verbal cues   Comprehension Returned demonstration;Verbalized understanding         Objectives    There-ex     Vitals obtained. Blood pressure L arm sitting, mechanically taken: 128/67HR 78   Seated gentle manual perturbation front and back with PT 30-40 seconds x 4 to promote trunk control  Then side bending direction 30 seconds x 4  Then side to side 30 seconds x 4, pt holding PVC bar  Seated gentle manual resistance to L trunk 5 seconds x 10 for 3sets to promote use of L trunk muscles and gently take off R side bending pressure.  Pt was recommended to alternate sitting on a chair with a back rest to a chair without a back rest (for trunk muscle use)   Seated  manually resisted clamshell isometrics (gentle), hips less than 90 degrees flexion 5x3  Seated LAQ 5x2 seconds for 2 sets . Easier for R LE compared to L per pt reports  Seated manually resisted leg press 5x3 each LE   Nustep seat 8, arms 7 at level 1 x 2.min (no charge) to promote endurance. Pt stopped at 2 minutes secondary to stating that it increased his dizziness. Vital signs taken. Blood pressure L arm sitting, mechanically taken 128/63, HR 62    Pt states that his heart rate is usually 67 BPM with the pacemaker.   Improved exercise technique, movement at target joints, use of target muscles after min to mod verbal, visual, tactile cues.     No R lateral thigh pain with gait with SPC x 40 ft after session. Continued working on gentle trunk exercises and gentle LE strengthening to help improve function. Dizziness increased when using the NuStep machine, eased with rest. Possible association between L arm movements and activities with symptoms based on the past 2 sessions. Light activities/exercises performed.           PT Long Term Goals - 04/03/17 1828      PT LONG TERM GOAL #1   Title Patient will be able to ambulate at least 350 ft with SPC on R without LOB and mod I to promote mobility.    Baseline Pt able to ambulate 222 ft with SPC on R, CGA to SBA (02/20/2017); 400 ft with SPC (03/25/2017)   Time 6   Period Weeks   Status Achieved     PT LONG TERM GOAL #2   Title Patient will improve LEFS score by at least 9 points as a demonstration of improved function.    Baseline 24/80 (02/20/2017); 25/80 (04/03/2017)   Time 4   Period Weeks   Status On-going     PT LONG TERM GOAL #3   Title Patient will have a decrease in R lateral thigh pain to 5/10 or less at worst to promote ability to stand and walk.    Baseline 10/10 R lateral hip pain at worst (02/20/2017); 9-10/10 R lateral thigh pain at worst for the past 7 days (03/25/2017); 10/10 at worst for the past 7 days (04/03/2017)    Time 4   Period Weeks   Status On-going     PT LONG TERM GOAL #4   Title Patient will be able to turn 360 degrees safely and independently without LOB to promote ability to perform standing tasks.    Baseline Able to turn 360 degrees to  the R but felt dizziness at the end and needed assist to maintain balance (02/20/2017)   Time 6   Period Weeks   Status Deferred     PT LONG TERM GOAL #5   Title Patient will be able to ambulate at least 500 ft with SPC on R without LOB and mod I to promote mobility.    Baseline 400 ft with SPC (03/25/2017)   Time 6   Period Weeks   Status On-going               Plan - 04/23/17 1051    Clinical Impression Statement No R lateral thigh pain with gait with SPC x 40 ft after session. Continued working on gentle trunk exercises and gentle LE strengthening to help improve function. Dizziness increased when using the NuStep machine, eased with rest. Possible association between L arm movements and activities with symptoms based on the past 2 sessions. Light activities/exercises performed.    Rehab Potential Fair   Clinical Impairments Affecting Rehab Potential Chronicity of condition, age   PT Frequency 2x / week   PT Duration 4 weeks   PT Treatment/Interventions Therapeutic activities;Therapeutic exercise;Neuromuscular re-education;Patient/family education;Manual techniques;Dry needling;Gait training;Stair training;Functional mobility training;Aquatic Therapy   PT Next Visit Plan posture, scapular, hip strengthening, gait, balance   Consulted and Agree with Plan of Care Patient      Patient will benefit from skilled therapeutic intervention in order to improve the following deficits and impairments:  Pain, Postural dysfunction, Improper body mechanics, Decreased strength, Difficulty walking, Decreased range of motion, Decreased endurance, Decreased balance  Visit Diagnosis: Difficulty in walking, not elsewhere classified  Muscle weakness  (generalized)  Pain in right thigh     Problem List Patient Active Problem List   Diagnosis Date Noted  . Encounter for preventive health examination 03/30/2017  . Generalized muscle weakness 07/24/2016  . Major depressive disorder, recurrent episode ([place]) 01/24/2016  . Vitamin D deficiency 01/24/2016  . Fatigue 01/23/2016  . Benign essential HTN 03/04/2015  . H/O prosthetic heart valve 03/04/2015  . Symptomatic bradycardia 02/21/2015  . Episodic lightheadedness 02/21/2015  . Exertional dyspnea 06/25/2014  . Cardiomyopathy ([place]) 03/09/2014  . Second degree AV block, Mobitz type I 11/20/2013  . Secondary cardiomyopathy ([place]) 11/18/2013  . Hyperlipidemia 04/27/2013  . Sciatica of right side 03/16/2013  . Other and unspecified hyperlipidemia 03/16/2013  . Anemia, iron deficiency 09/15/2012  . Osteoporosis, senile 09/15/2012  . Atrial flutter ([place]) 06/26/2012  . Depression 12/24/2011  . Hypertension 08/25/2011  . Neuropathy of lower extremity 08/25/2011  . Bradycardia 08/25/2011  . Atrial fibrillation ([place])   . History of mitral valve replacement with mechanical valve   . Long term current use of anticoagulant therapy 08/22/2011   Joneen Boers PT, DPT  04/23/2017, 12:53 PM  [facility] [address], [place], [address] Phone: [phone]   Fax:  [phone]  Name: Kevin [name] MRN: [id number] Date of Birth: 03/24/1925

## 2017-04-24 ENCOUNTER — Telehealth: Payer: Self-pay

## 2017-04-24 NOTE — Telephone Encounter (Signed)
Called Dr. Etta Quill office (pt called him before for dizziness) pertaining to pt dizziness. Informed nurse ([place]) that for the past 2 sessions, dizziness seems to occur during activity and L arm movements. No head movements observed at the time. Nurse states that she talked to pt and gave him antevert and meclizine. She also suggested for pt to see an ear, nose and throat doctor as well as asked him when was the last time his eye glass prescription was checked since that can cause dizziness too. Pacemaker check looks good. Ok to continue with exercises/PT but not to push the pt. She might also refer him to a neurologist.

## 2017-04-25 ENCOUNTER — Ambulatory Visit: Payer: Medicare Other

## 2017-04-25 DIAGNOSIS — R262 Difficulty in walking, not elsewhere classified: Secondary | ICD-10-CM

## 2017-04-25 DIAGNOSIS — M6281 Muscle weakness (generalized): Secondary | ICD-10-CM

## 2017-04-25 DIAGNOSIS — M79651 Pain in right thigh: Secondary | ICD-10-CM

## 2017-04-25 NOTE — Therapy (Signed)
[facility] [address] [place], [address] Phone: [phone]   Fax:  [phone]  Physical Therapy Treatment  Patient Details  Name: Kevin [name] MRN: [id number] Date of Birth: [birth year] Referring Provider: Deborra Medina, MD  Encounter Date: 04/25/2017      PT End of Session - 04/25/17 1026    Visit Number 17   Number of Visits 21   Date for PT Re-Evaluation 05/02/17   Authorization Type 9   Authorization Time Period of 10   PT Start Time 1028   PT Stop Time 1120   PT Time Calculation (min) 52 min   Activity Tolerance Patient tolerated treatment well   Behavior During Therapy [facility] for tasks assessed/performed      Past Medical History:  Diagnosis Date  . Atrial fibrillation ([place])   . Cancer ([place])    melanoma- right ear  . Chronic headaches started age 55  . GERD (gastroesophageal reflux disease)   . Glaucoma   . History of mitral valve replacement with mechanical valve   . Hypertension   . Osteoporosis    secondary to low testoerone  . Sciatica   . Sciatica     Past Surgical History:  Procedure Laterality Date  . CARDIOVERSION  2013  . CATARACT EXTRACTION, BILATERAL     x 2  . HERNIA REPAIR     x 2  . MELANOMA EXCISION     right ear  . MITRAL VALVE REPLACEMENT  1994  . NASAL SEPTUM SURGERY  1963  . PERMANENT PACEMAKER INSERTION Left 12/22/2014    There were no vitals filed for this visit.      Subjective Assessment - 04/25/17 1031    Subjective No pain in R thigh right now. Took his meclizine yesterday and got along well. Feels uncertain with his movements when he stands up or bends over. Took another meclizine this morning around 9 am and a headache medicine due to his headache.  Pt states alternating sitting with back unsupported to sitting with back supported. Can only sit without back support for 10 minutes.   R lateral thigh also bothered him yesterday and took a pain  medication for it.  Pt states that the walk from his room to his nurse is long for him and has to use his rw. Takes rest breaks.  Pt states not being able to tell if PT is helping him but maybe something like this takes time.      Pertinent History Pt states having R lateral thigh pain which is his main trouble which comes from scoliosis, irriating his nerve coming out of the 5th vertebra. Has been having pain since 4 years ago, gradual onset. Goes to an osteopath in [place], was referred to PT. Had PT for 2 years in [place] until his therapist gave up. Treatments included stretching his R side, bridging, posterior pelvic tilting with deep breaths and exhaling, supine trunk rotation. Pt was told that the exercises will not help him but might keep him from deteriorating further. Feels like his condition is worse currently. Does his HEP 2x/day regularly.  Has been using a SPC for 2 years.   Patient Stated Goals I'd like to walk without a cane or walker, without pain, have more endurance.    Currently in Pain? No/denies   Pain Score 0-No pain   Pain Onset More than a month ago  [facility] PT Assessment - 04/25/17 1132      Observation/Other Assessments   Lower Extremity Functional Scale  23/80                             PT Education - 04/25/17 1031    Education provided Yes   Education Details ther-ex   [employer]) Educated Patient   Methods Explanation;Demonstration;Tactile cues;Verbal cues   Comprehension Returned demonstration;Verbalized understanding        Objectives  Pt states not being able to tell if PT is helping him but maybe something like this takes time.    There-ex     Vitals obtained. Blood pressure R arm sitting, mechanically taken: 108/63HR 76  Seated manually resisted clamshell isometrics (gentle), hips less than 90 degrees flexion 5x3  Seated gentle manual perturbation front and back with PT 40 seconds x 3 to  promote trunk control. Pt holding PVC bar with PT providing resistance through bar gently Then side bending direction 30 seconds x 4             Then side to side 30 seconds x 3, pt holding PVC bar  Seated gentle manual resistance to L trunk 5 seconds x 10 for 2sets to promote use of L trunk muscles and gently take off R side bending pressure.  Increased R lateral thigh symptoms today with sitting unsupported with gentle trunk exercises.    Pt transferred to chair with back support. Decreased symptoms at R lateral thigh.   Decreased dizziness at this point of PT session.   Gait around gym with SPC 100 ft x3. Sitting rest breaks after each 100 ft.   Pt states feeling out of breath after 3rd lap of 100 ft  Sitting rest break provided.   Turning 360 degrees to the R without SPC 1x slowly  Dizziness which subsides with rest. Used the SPC 1x during the turn.  Improved exercise technique, movement at target joints, use of target muscles after mod verbal, visual, tactile cues.     Increased symptoms with sitting without trunk support today. Eases when pt sits and uses the back rest. Pt was recommended to continue alternating between sitting with back supported and unsupported to help with his symptoms at home. Dizziness reproduced again with turning to the R 360 degrees today which eases with sitting rest break. Continued on gentle trunk exercises as tolerated and walking today to promote activity tolerance and endurance. Pt fatigues after 3rd repetition of 100 ft of walking today. Similar LEFS score compared to 02/20/2017, and 04/03/2017.         PT Long Term Goals - 04/03/17 1828      PT LONG TERM GOAL #1   Title Patient will be able to ambulate at least 350 ft with SPC on R without LOB and mod I to promote mobility.    Baseline Pt able to ambulate 222 ft with SPC on R, CGA to SBA (02/20/2017); 400 ft with SPC (03/25/2017)   Time 6   Period Weeks   Status  Achieved     PT LONG TERM GOAL #2   Title Patient will improve LEFS score by at least 9 points as a demonstration of improved function.    Baseline 24/80 (02/20/2017); 25/80 (04/03/2017)   Time 4   Period Weeks   Status On-going     PT LONG TERM GOAL #3   Title Patient will have a decrease in R lateral  thigh pain to 5/10 or less at worst to promote ability to stand and walk.    Baseline 10/10 R lateral hip pain at worst (02/20/2017); 9-10/10 R lateral thigh pain at worst for the past 7 days (03/25/2017); 10/10 at worst for the past 7 days (04/03/2017)   Time 4   Period Weeks   Status On-going     PT LONG TERM GOAL #4   Title Patient will be able to turn 360 degrees safely and independently without LOB to promote ability to perform standing tasks.    Baseline Able to turn 360 degrees to the R but felt dizziness at the end and needed assist to maintain balance (02/20/2017)   Time 6   Period Weeks   Status Deferred     PT LONG TERM GOAL #5   Title Patient will be able to ambulate at least 500 ft with SPC on R without LOB and mod I to promote mobility.    Baseline 400 ft with SPC (03/25/2017)   Time 6   Period Weeks   Status On-going               Plan - 04/25/17 1025    Clinical Impression Statement Increased symptoms with sitting without trunk support today. Eases when pt sits and uses the back rest. Pt was recommended to continue alternating between sitting with back supported and unsupported to help with his symptoms at home. Dizziness reproduced again with turning to the R 360 degrees today which eases with sitting rest break. Continued on gentle trunk exercises as tolerated and walking today to promote activity tolerance and endurance. Pt fatigues after 3rd repetition of 100 ft of walking today. Similar LEFS score compared to 02/20/2017, and 04/03/2017.   Rehab Potential Fair   Clinical Impairments Affecting Rehab Potential Chronicity of condition, age   PT Frequency 2x / week    PT Duration 4 weeks   PT Treatment/Interventions Therapeutic activities;Therapeutic exercise;Neuromuscular re-education;Patient/family education;Manual techniques;Dry needling;Gait training;Stair training;Functional mobility training;Aquatic Therapy   PT Next Visit Plan posture, scapular, hip strengthening, gait, balance   Consulted and Agree with Plan of Care Patient      Patient will benefit from skilled therapeutic intervention in order to improve the following deficits and impairments:  Pain, Postural dysfunction, Improper body mechanics, Decreased strength, Difficulty walking, Decreased range of motion, Decreased endurance, Decreased balance  Visit Diagnosis: Pain in right thigh  Difficulty in walking, not elsewhere classified  Muscle weakness (generalized)     Problem List Patient Active Problem List   Diagnosis Date Noted  . Encounter for preventive health examination 03/30/2017  . Generalized muscle weakness 07/24/2016  . Major depressive disorder, recurrent episode ([place]) 01/24/2016  . Vitamin D deficiency 01/24/2016  . Fatigue 01/23/2016  . Benign essential HTN 03/04/2015  . H/O prosthetic heart valve 03/04/2015  . Symptomatic bradycardia 02/21/2015  . Episodic lightheadedness 02/21/2015  . Exertional dyspnea 06/25/2014  . Cardiomyopathy ([place]) 03/09/2014  . Second degree AV block, Mobitz type I 11/20/2013  . Secondary cardiomyopathy ([place]) 11/18/2013  . Hyperlipidemia 04/27/2013  . Sciatica of right side 03/16/2013  . Other and unspecified hyperlipidemia 03/16/2013  . Anemia, iron deficiency 09/15/2012  . Osteoporosis, senile 09/15/2012  . Atrial flutter ([place]) 06/26/2012  . Depression 12/24/2011  . Hypertension 08/25/2011  . Neuropathy of lower extremity 08/25/2011  . Bradycardia 08/25/2011  . Atrial fibrillation ([place])   . History of mitral valve replacement with mechanical valve   . Long term current use of anticoagulant  therapy 08/22/2011    Joneen Boers  PT, DPT   04/25/2017, 11:40 AM  [facility] [address] [place], [address]  Phone: [phone]   Fax:  [phone]  Name: HULAN SZUMSKI MRN: [id number] Date of Birth: 05-20-1925

## 2017-04-29 ENCOUNTER — Ambulatory Visit: Payer: Medicare Other

## 2017-04-29 DIAGNOSIS — M79651 Pain in right thigh: Secondary | ICD-10-CM | POA: Diagnosis not present

## 2017-04-29 DIAGNOSIS — R262 Difficulty in walking, not elsewhere classified: Secondary | ICD-10-CM

## 2017-04-29 DIAGNOSIS — M6281 Muscle weakness (generalized): Secondary | ICD-10-CM

## 2017-04-29 DIAGNOSIS — Z9181 History of falling: Secondary | ICD-10-CM

## 2017-04-29 NOTE — Patient Instructions (Addendum)
  Alternate between sitting unsupported with upright posture and slight tilt to your left side   To sitting with your back supported  Every 5 minutes.   Increase time of unsupported sitting as tolerated to build up endurance with trunk muscles.       Continue walking as much as you can with your rolling walker as tolerated to maintain your endurance.   Rest breaks as needed.        Standing and holding onto something sturdy for support:   Perform marches 5 times each leg.    Repeat 2 times daily to help with foot clearance.

## 2017-04-29 NOTE — Therapy (Signed)
[facility] [address] [place], [address] Phone: [phone]   Fax:  [phone]  Physical Therapy Treatment And Progress Report  Patient Details  Name: Kevin [name] MRN: [id number] Date of Birth: 06-15-1925 Referring Provider: Deborra Medina, MD  Encounter Date: 04/29/2017      PT End of Session - 04/29/17 1116    Visit Number 18   Number of Visits 29   Date for PT Re-Evaluation 05/30/17   Authorization Type 1   Authorization Time Period of 10   PT Start Time 1116   PT Stop Time 1206   PT Time Calculation (min) 50 min   Activity Tolerance Patient tolerated treatment well   Behavior During Therapy [facility] for tasks assessed/performed      Past Medical History:  Diagnosis Date  . Atrial fibrillation ([place])   . Cancer ([place])    melanoma- right ear  . Chronic headaches started age 69  . GERD (gastroesophageal reflux disease)   . Glaucoma   . History of mitral valve replacement with mechanical valve   . Hypertension   . Osteoporosis    secondary to low testoerone  . Sciatica   . Sciatica     Past Surgical History:  Procedure Laterality Date  . CARDIOVERSION  2013  . CATARACT EXTRACTION, BILATERAL     x 2  . HERNIA REPAIR     x 2  . MELANOMA EXCISION     right ear  . MITRAL VALVE REPLACEMENT  1994  . NASAL SEPTUM SURGERY  1963  . PERMANENT PACEMAKER INSERTION Left 12/22/2014    There were no vitals filed for this visit.      Subjective Assessment - 04/29/17 1121    Subjective Pt states that his R thigh is better than usual. 5/10 currently but bothers him when he walks. Got up this morning and was very painful. Kept him awake a good bit during the night.  Pt was lying down on his side. Does not know which side. He thinks he tried one side and the other but it did not help. 9/10 R lateral thigh pain at most for the past 7 days.  Pt states feeling better over all since last session. Dizziness  has not bothered him as much. Then medicine the doctor gave him helps.  Had to take 2 acetomenophen pills yesterday. Did not take any today.    Pertinent History Pt states having R lateral thigh pain which is his main trouble which comes from scoliosis, irriating his nerve coming out of the 5th vertebra. Has been having pain since 4 years ago, gradual onset. Goes to an osteopath in [place], was referred to PT. Had PT for 2 years in [place] until his therapist gave up. Treatments included stretching his R side, bridging, posterior pelvic tilting with deep breaths and exhaling, supine trunk rotation. Pt was told that the exercises will not help him but might keep him from deteriorating further. Feels like his condition is worse currently. Does his HEP 2x/day regularly.  Has been using a SPC for 2 years.   Patient Stated Goals I'd like to walk without a cane or walker, without pain, have more endurance.    Currently in Pain? Yes   Pain Score 5    Pain Onset More than a month ago            [facility] PT Assessment - 04/29/17 1119      Observation/Other  Assessments   Lower Extremity Functional Scale  23/80  measured on 04/25/2017                             PT Education - 04/29/17 1128    Education provided Yes   Education Details ther-ex, HEP   Person(s) Educated Patient   Methods Explanation;Demonstration;Tactile cues;Verbal cues;Handout   Comprehension Returned demonstration;Verbalized understanding        Objectives  Decreasing starting starting pain level overall observed compared to previous sessions.    There-ex     Vitals obtained. Blood pressure L arm sitting, mechanically taken: 129/67HR 79  Reviewed HEP with pt. Pt demonstrated and verbalized understanding. Handout provided.    Seated gentle manual resistance to L trunk 5 seconds x 10 for 2sets to promote use of L trunk muscles and gently take off R side bending pressure.  Seated  manually resisted clamshell isometrics (gentle), hips less than 90 degrees flexion 5x3  Seated gentle manual perturbation front and back with PT 40seconds x 3to promote trunk control. Pt holding PVC bar with PT providing resistance through bar gently  Then side to side 30 seconds x 3, pt holding PVC bar  Then side bending direction 30 seconds x 2 slight R lateral thigh discomfort which eases with rest. Then performed 2 more times. No increased symptoms afterwards  Gait around gym with SPC 192 ft. Then 100 ft.  Sitting rest breaks after each repetition.      Improved exercise technique, movement at target joints, use of target muscles after min to mod verbal, visual, tactile cues.    Decreased R lateral thigh pain to a solid 9/10 at worst. Gait distance varies with pt being able to ambulate up to 400 ft with SPC without rest on 03/25/2017 but needed a rest break today after 192 ft. Pt however overall demonstrates decreased R lateral thigh pain to solid 9/10 (pain level between 9-10/10 before) at worst for the past 7 days. Recent LEFS score similar to previous surveys. Pt still demonstrates R lateral thigh pain but with slight improvement; LE weakness, decreased endurance and difficulty performing functional tasks and would benefit from continued skilled physical therapy intervention to address the aforementioned deficits. Challenges to progress include chronicity of pain and scoliosis and co-morbidities.                      PT Long Term Goals - 04/29/17 1212      PT LONG TERM GOAL #1   Title Patient will be able to ambulate at least 350 ft with SPC on R without LOB and mod I to promote mobility.    Baseline Pt able to ambulate 222 ft with SPC on R, CGA to SBA (02/20/2017); 400 ft with SPC (03/25/2017)   Time 6   Period Weeks   Status Achieved     PT LONG TERM GOAL #2   Title Patient will improve LEFS score by at least 9 points as a demonstration  of improved function.    Baseline 24/80 (02/20/2017); 25/80 (04/03/2017); 23/80 (04/25/2017)   Time 4   Period Weeks   Status On-going     PT LONG TERM GOAL #3   Title Patient will have a decrease in R lateral thigh pain to 5/10 or less at worst to promote ability to stand and walk.    Baseline 10/10 R lateral hip pain at worst (02/20/2017); 9-10/10 R lateral thigh pain  at worst for the past 7 days (03/25/2017); 10/10 at worst for the past 7 days (04/03/2017); 9/10 at worst for the past 7 days (2017/05/17)   Time 4   Period Weeks   Status On-going     PT LONG TERM GOAL #4   Title Patient will be able to turn 360 degrees safely and independently without LOB to promote ability to perform standing tasks.    Baseline Able to turn 360 degrees to the R but felt dizziness at the end and needed assist to maintain balance (02/20/2017); Dizziness with turning 360 degrees (04/25/2017)   Time 6   Period Weeks   Status Not Met     PT LONG TERM GOAL #5   Title Patient will be able to ambulate at least 500 ft with [facility] on R without LOB and mod I to promote mobility.    Baseline 400 ft with SPC (03/25/2017)   Time 6   Period Weeks   Status Deferred               Plan - 17-May-2017 1120    Clinical Impression Statement Decreased R lateral thigh pain to a solid 9/10 at worst. Gait distance varies with pt being able to ambulate up to 400 ft with SPC without rest on 03/25/2017 but needed a rest break today after 192 ft. Pt however overall demonstrates decreased R lateral thigh pain to solid 9/10 (pain level between 9-10/10 before) at worst for the past 7 days. Recent LEFS score similar to previous surveys. Pt still demonstrates R lateral thigh pain but with slight improvement; LE weakness, decreased endurance and difficulty performing functional tasks and would benefit from continued skilled physical therapy intervention to address the aforementioned deficits. Challenges to progress include chronicity of pain and  scoliosis and co-morbidities.     Rehab Potential Fair   Clinical Impairments Affecting Rehab Potential Chronicity of condition, age   PT Frequency 2x / week   PT Duration 4 weeks   PT Treatment/Interventions Therapeutic activities;Therapeutic exercise;Neuromuscular re-education;Patient/family education;Manual techniques;Dry needling;Gait training;Stair training;Functional mobility training;Aquatic Therapy   PT Next Visit Plan posture, scapular, hip strengthening, gait, balance   Consulted and Agree with Plan of Care Patient      Patient will benefit from skilled therapeutic intervention in order to improve the following deficits and impairments:  Pain, Postural dysfunction, Improper body mechanics, Decreased strength, Difficulty walking, Decreased range of motion, Decreased endurance, Decreased balance  Visit Diagnosis: Pain in right thigh - Plan: PT plan of care cert/re-cert  Difficulty in walking, not elsewhere classified - Plan: PT plan of care cert/re-cert  Muscle weakness (generalized) - Plan: PT plan of care cert/re-cert  History of falling - Plan: PT plan of care cert/re-cert       G-Codes - 05-17-2017 1222    Functional Assessment Tool Used (Outpatient Only) LEFS, clinical presentation, patient interview, gait   Functional Limitation Mobility: Walking and moving around   Mobility: Walking and Moving Around Current Status ([id number]) At least 60 percent but less than 80 percent impaired, limited or restricted   Mobility: Walking and Moving Around Goal Status [phone]) At least 40 percent but less than 60 percent impaired, limited or restricted      Problem List Patient Active Problem List   Diagnosis Date Noted  . Encounter for preventive health examination 03/30/2017  . Generalized muscle weakness 07/24/2016  . Major depressive disorder, recurrent episode ([place]) 01/24/2016  . Vitamin D deficiency 01/24/2016  . Fatigue 01/23/2016  . Benign  essential HTN 03/04/2015  . H/O  prosthetic heart valve 03/04/2015  . Symptomatic bradycardia 02/21/2015  . Episodic lightheadedness 02/21/2015  . Exertional dyspnea 06/25/2014  . Cardiomyopathy ([place]) 03/09/2014  . Second degree AV block, Mobitz type I 11/20/2013  . Secondary cardiomyopathy ([place]) 11/18/2013  . Hyperlipidemia 04/27/2013  . Sciatica of right side 03/16/2013  . Other and unspecified hyperlipidemia 03/16/2013  . Anemia, iron deficiency 09/15/2012  . Osteoporosis, senile 09/15/2012  . Atrial flutter ([place]) 06/26/2012  . Depression 12/24/2011  . Hypertension 08/25/2011  . Neuropathy of lower extremity 08/25/2011  . Bradycardia 08/25/2011  . Atrial fibrillation ([place])   . History of mitral valve replacement with mechanical valve   . Long term current use of anticoagulant therapy 08/22/2011    Thank you for your referral.  Joneen Boers PT, DPT   04/29/2017, 12:44 PM  [facility] [address] [place], [address] Phone: [phone]   Fax:  [phone]  Name: JAHKAI YANDELL MRN: [id number] Date of Birth: Sep 22, 1925

## 2017-05-01 ENCOUNTER — Ambulatory Visit: Payer: Medicare Other

## 2017-05-08 ENCOUNTER — Other Ambulatory Visit: Payer: Self-pay | Admitting: Internal Medicine

## 2017-05-09 ENCOUNTER — Encounter: Payer: Self-pay | Admitting: Physical Therapy

## 2017-05-09 ENCOUNTER — Ambulatory Visit: Payer: Medicare Other | Admitting: Physical Therapy

## 2017-05-09 VITALS — BP 141/114 | HR 76

## 2017-05-09 DIAGNOSIS — M79651 Pain in right thigh: Secondary | ICD-10-CM | POA: Diagnosis not present

## 2017-05-09 DIAGNOSIS — Z9181 History of falling: Secondary | ICD-10-CM

## 2017-05-09 DIAGNOSIS — M6281 Muscle weakness (generalized): Secondary | ICD-10-CM

## 2017-05-09 DIAGNOSIS — R262 Difficulty in walking, not elsewhere classified: Secondary | ICD-10-CM

## 2017-05-09 NOTE — Therapy (Signed)
[facility] [address], [place], [address] Phone: [phone]   Fax:  [phone]  Physical Therapy Treatment  Patient Details  Name: Kevin [name] MRN: [id number] Date of Birth: 05-13-1925 Referring Provider: Deborra Medina, MD  Encounter Date: 05/09/2017      PT End of Session - 05/09/17 1519    Visit Number 19   Number of Visits 29   Date for PT Re-Evaluation 05/30/17   Authorization Type 2   Authorization Time Period of 10   PT Start Time 1517   PT Stop Time 1602   PT Time Calculation (min) 45 min   Activity Tolerance Patient tolerated treatment well   Behavior During Therapy [facility] for tasks assessed/performed      Past Medical History:  Diagnosis Date  . Atrial fibrillation ([place])   . Cancer ([place])    melanoma- right ear  . Chronic headaches started age 58  . GERD (gastroesophageal reflux disease)   . Glaucoma   . History of mitral valve replacement with mechanical valve   . Hypertension   . Osteoporosis    secondary to low testoerone  . Sciatica   . Sciatica     Past Surgical History:  Procedure Laterality Date  . CARDIOVERSION  2013  . CATARACT EXTRACTION, BILATERAL     x 2  . HERNIA REPAIR     x 2  . MELANOMA EXCISION     right ear  . MITRAL VALVE REPLACEMENT  1994  . NASAL SEPTUM SURGERY  1963  . PERMANENT PACEMAKER INSERTION Left 12/22/2014    Vitals:   05/09/17 1521  BP: (!) 141/114  Pulse: 76  SpO2: 97%        Subjective Assessment - 05/09/17 1521    Subjective Pt reports he has had a headache since noon today but it is not unusual for him to have a headache.  Pt denies any new changes or concerns.  He has been completing HEP without any questions.   Pertinent History Pt states having R lateral thigh pain which is his main trouble which comes from scoliosis, irriating his nerve coming out of the 5th vertebra. Has been having pain since 4 years ago, gradual onset. Goes  to an osteopath in [place], was referred to PT. Had PT for 2 years in [place] until his therapist gave up. Treatments included stretching his R side, bridging, posterior pelvic tilting with deep breaths and exhaling, supine trunk rotation. Pt was told that the exercises will not help him but might keep him from deteriorating further. Feels like his condition is worse currently. Does his HEP 2x/day regularly.  Has been using a SPC for 2 years.   Patient Stated Goals I'd like to walk without a cane or walker, without pain, have more endurance.    Currently in Pain? Yes   Pain Score 6    Pain Location Head   Pain Descriptors / Indicators Headache   Pain Type Acute pain   Pain Onset Today   Multiple Pain Sites Yes   Pain Score 8   Pain Location Leg   Pain Orientation Right   Pain Descriptors / Indicators Aching   Pain Type Chronic pain   Pain Onset More than a month ago   Pain Frequency Constant       Ther-ex:    Vitals obtained. Blood pressure L arm sitting, mechanically taken: 141/114, retaken after several minutes of rest and reading 104/51.  HR 79. SpO2 97%.  Seated gentle manual resistance to L trunk sidebending 5 seconds x 10 for 2?sets to promote use of L trunk muscles and gently take off R side bending pressure.   +R Slump Test, negative L Slump Test. Pt performed seated sciatic nerve glide moving R ankle between DF and PF x10 each direction in seated position.   Seated manually resisted clamshell isometrics (gentle), hips 90 degrees flexion 10x5 second holds   Seated gentle manual perturbation front and back with PT 30sec x 2 to promote trunk control. Pt holding PVC bar with PT providing resistance through bar gently. Repeated side to side 30 seconds x 2, pt holding PVC bar.   Standing R trunk with R arm resting overhead in abduction on wall for gentle stretch. 2x25 second holds.   Facing wall R lat stretch with R arm overhead for gentle stretch 2x25 seconds   R  sidelying with pillow under R hip x4 minutes for gentle stretching of R trunk region. Cues to reach up into F with R arm in attempt to provoke gentle R trunk stretch.   Pt unfortunately did not feel stretch in R trunk region as anticipated with overhead reaching and sidelying stretches.    Ambulating in gym without cane with cues to exaggerate arm swing Bil for greater trunk rotation, especially on the R. CGA. 3x15 ft.          PT Education - 05/09/17 1518    Education provided Yes   Education Details Exercise technique; clinical reasoning behind interventions   Person(s) Educated Patient   Methods Explanation;Demonstration;Verbal cues   Comprehension Verbalized understanding;Returned demonstration;Verbal cues required;Need further instruction             PT Long Term Goals - 04/29/17 1212      PT LONG TERM GOAL #1   Title Patient will be able to ambulate at least 350 ft with SPC on R without LOB and mod I to promote mobility.    Baseline Pt able to ambulate 222 ft with SPC on R, CGA to SBA (02/20/2017); 400 ft with SPC (03/25/2017)   Time 6   Period Weeks   Status Achieved     PT LONG TERM GOAL #2   Title Patient will improve LEFS score by at least 9 points as a demonstration of improved function.    Baseline 24/80 (02/20/2017); 25/80 (04/03/2017); 23/80 (04/25/2017)   Time 4   Period Weeks   Status On-going     PT LONG TERM GOAL #3   Title Patient will have a decrease in R lateral thigh pain to 5/10 or less at worst to promote ability to stand and walk.    Baseline 10/10 R lateral hip pain at worst (02/20/2017); 9-10/10 R lateral thigh pain at worst for the past 7 days (03/25/2017); 10/10 at worst for the past 7 days (04/03/2017); 9/10 at worst for the past 7 days (04/29/2017)   Time 4   Period Weeks   Status On-going     PT LONG TERM GOAL #4   Title Patient will be able to turn 360 degrees safely and independently without LOB to promote ability to perform standing tasks.     Baseline Able to turn 360 degrees to the R but felt dizziness at the end and needed assist to maintain balance (02/20/2017); Dizziness with turning 360 degrees (04/25/2017)   Time 6   Period Weeks   Status Not Met     PT LONG TERM GOAL #  5   Title Patient will be able to ambulate at least 500 ft with SPC on R without LOB and mod I to promote mobility.    Baseline 400 ft with SPC (03/25/2017)   Time 6   Period Weeks   Status Deferred               Plan - 05/09/17 1606    Clinical Impression Statement Pt tolerated all interventions well.  Focused interventions on stretching R trunk region to compliment L trunk strengthening exercise.  Pt demonstrates a positive R Slump Test so had pt perform nerve glide to address this, will follow up with this at future sessions to determine effectiveness.  Practiced ambulating in gym with exaggeration of Bil arm swing which pt performed at CGA level.  Pt will benefit from continued skilled PT interventions for improved functional mobility and decreased pain.    Rehab Potential Fair   Clinical Impairments Affecting Rehab Potential Chronicity of condition, age   PT Frequency 2x / week   PT Duration 4 weeks   PT Treatment/Interventions Therapeutic activities;Therapeutic exercise;Neuromuscular re-education;Patient/family education;Manual techniques;Dry needling;Gait training;Stair training;Functional mobility training;Aquatic Therapy   PT Next Visit Plan posture, scapular, hip strengthening, gait, balance   Consulted and Agree with Plan of Care Patient      Patient will benefit from skilled therapeutic intervention in order to improve the following deficits and impairments:  Pain, Postural dysfunction, Improper body mechanics, Decreased strength, Difficulty walking, Decreased range of motion, Decreased endurance, Decreased balance  Visit Diagnosis: Difficulty in walking, not elsewhere classified  Muscle weakness (generalized)  Pain in right  thigh  History of falling     Problem List Patient Active Problem List   Diagnosis Date Noted  . Encounter for preventive health examination 03/30/2017  . Generalized muscle weakness 07/24/2016  . Major depressive disorder, recurrent episode ([place]) 01/24/2016  . Vitamin D deficiency 01/24/2016  . Fatigue 01/23/2016  . Benign essential HTN 03/04/2015  . H/O prosthetic heart valve 03/04/2015  . Symptomatic bradycardia 02/21/2015  . Episodic lightheadedness 02/21/2015  . Exertional dyspnea 06/25/2014  . Cardiomyopathy ([place]) 03/09/2014  . Second degree AV block, Mobitz type I 11/20/2013  . Secondary cardiomyopathy ([place]) 11/18/2013  . Hyperlipidemia 04/27/2013  . Sciatica of right side 03/16/2013  . Other and unspecified hyperlipidemia 03/16/2013  . Anemia, iron deficiency 09/15/2012  . Osteoporosis, senile 09/15/2012  . Atrial flutter ([place]) 06/26/2012  . Depression 12/24/2011  . Hypertension 08/25/2011  . Neuropathy of lower extremity 08/25/2011  . Bradycardia 08/25/2011  . Atrial fibrillation ([place])   . History of mitral valve replacement with mechanical valve   . Long term current use of anticoagulant therapy 08/22/2011    Collie Siad PT, DPT 05/09/2017, 4:14 PM  [facility] [address] [place], [address] Phone: [phone]   Fax:  [phone]  Name: Kevin [name] MRN: [id number] Date of Birth: 1925-01-31

## 2017-05-14 ENCOUNTER — Ambulatory Visit: Payer: Medicare Other | Attending: Internal Medicine

## 2017-05-14 DIAGNOSIS — M6281 Muscle weakness (generalized): Secondary | ICD-10-CM | POA: Insufficient documentation

## 2017-05-14 DIAGNOSIS — R262 Difficulty in walking, not elsewhere classified: Secondary | ICD-10-CM | POA: Insufficient documentation

## 2017-05-14 DIAGNOSIS — M79651 Pain in right thigh: Secondary | ICD-10-CM | POA: Diagnosis present

## 2017-05-14 NOTE — Patient Instructions (Signed)
  Sitting on a chair:    Gently squeeze your rear end muscles together comfortably.    Perform as tolerated throughout he day.

## 2017-05-14 NOTE — Therapy (Signed)
[facility] [address], [place], [address] Phone: [phone]   Fax:  [phone]  Physical Therapy Treatment  Patient Details  Name: Kevin [name] MRN: [id number] Date of Birth: 1925/10/19 Referring Provider: Deborra Medina, MD  Encounter Date: 05/14/2017      PT End of Session - 05/14/17 0947    Visit Number 20   Number of Visits 29   Date for PT Re-Evaluation 05/30/17   Authorization Type 3   Authorization Time Period of 10   PT Start Time [phone]   PT Stop Time 1030   PT Time Calculation (min) 43 min   Activity Tolerance Patient tolerated treatment well   Behavior During Therapy [facility] for tasks assessed/performed      Past Medical History:  Diagnosis Date  . Atrial fibrillation ([place])   . Cancer ([place])    melanoma- right ear  . Chronic headaches started age 39  . GERD (gastroesophageal reflux disease)   . Glaucoma   . History of mitral valve replacement with mechanical valve   . Hypertension   . Osteoporosis    secondary to low testoerone  . Sciatica   . Sciatica     Past Surgical History:  Procedure Laterality Date  . CARDIOVERSION  2013  . CATARACT EXTRACTION, BILATERAL     x 2  . HERNIA REPAIR     x 2  . MELANOMA EXCISION     right ear  . MITRAL VALVE REPLACEMENT  1994  . NASAL SEPTUM SURGERY  1963  . PERMANENT PACEMAKER INSERTION Left 12/22/2014    There were no vitals filed for this visit.      Subjective Assessment - 05/14/17 0950    Subjective Did just fine after last session. R thigh feels worse. More uncomfortable than usual. 8/10 currently.    Pertinent History Pt states having R lateral thigh pain which is his main trouble which comes from scoliosis, irriating his nerve coming out of the 5th vertebra. Has been having pain since 4 years ago, gradual onset. Goes to an osteopath in [place], was referred to PT. Had PT for 2 years in [place] until his therapist gave up.  Treatments included stretching his R side, bridging, posterior pelvic tilting with deep breaths and exhaling, supine trunk rotation. Pt was told that the exercises will not help him but might keep him from deteriorating further. Feels like his condition is worse currently. Does his HEP 2x/day regularly.  Has been using a SPC for 2 years.   Patient Stated Goals I'd like to walk without a cane or walker, without pain, have more endurance.    Currently in Pain? Yes   Pain Score 8    Pain Onset Today   Pain Onset More than a month ago            [facility] PT Assessment - 05/14/17 0001      Assessment   Next MD Visit 05/22/2017                             PT Education - 05/14/17 0949    Education provided Yes   Education Details ther-ex   Person(s) Educated Patient   Methods Explanation;Demonstration;Tactile cues;Verbal cues   Comprehension Returned demonstration;Verbalized understanding        Objectives   Has an appointment with Dr. Derrel Nip next Wednesday 05/22/2017   There-ex  Vitals obtained. Blood pressure R arm sitting, mechanically taken: 113/61HR 73     Seated manually resisted clamshell isometrics (gentle), hips less than 90 degrees flexion 5x3  Seated gentle manual resistance to L trunk 5 seconds x 10 for 2sets to promote use of L trunk muscles and gently take off R side bending pressure.  Reviewed HEP pt performing: sitting unsupported 5 min at a time and sitting with slight L side bend. Corrected sitting with slight L side bend secondary to pt demonstrating L lateral shifting, causing increased R lumbar side bending when he was performing it at home. Pt demonstrated and verbalized understanding but consistent visual, verbal, tactile cues needed.   Seated gentle manual perturbation front and back with PT 40seconds x 3to promote trunk control. Pt holding PVC bar with PT providing resistance through bar gently Then side  bending direction 30 seconds x 4 Then side to side 30 seconds x 3, pt holding PVC bar  Seated with back supported:   R ankle DF/PF to promote R LE neural glide 10x2. No change in symptoms in R thigh  Gentle manual resisted hip adduction with glute max squeeze 5x3. Thighs at neutral.  Decreased R lateral thigh pain.   Improved exercise technique, movement at target joints, use of target muscles after min to mod verbal, visual, tactile cues.    Continued working on gentle trunk strengthening exercises in a more upright and less R side bending position unsupported. No decrease in symptoms with unsupported sitting today. Pt demonstrates tendency to not perform HEP properly so occasional review of HEP needed. Decreased R lateral thigh pain with increased gentle activation of his glute max and adductor muscles (to decrease R piriformis muscle activation).           PT Long Term Goals - 04/29/17 1212      PT LONG TERM GOAL #1   Title Patient will be able to ambulate at least 350 ft with SPC on R without LOB and mod I to promote mobility.    Baseline Pt able to ambulate 222 ft with SPC on R, CGA to SBA (02/20/2017); 400 ft with SPC (03/25/2017)   Time 6   Period Weeks   Status Achieved     PT LONG TERM GOAL #2   Title Patient will improve LEFS score by at least 9 points as a demonstration of improved function.    Baseline 24/80 (02/20/2017); 25/80 (04/03/2017); 23/80 (04/25/2017)   Time 4   Period Weeks   Status On-going     PT LONG TERM GOAL #3   Title Patient will have a decrease in R lateral thigh pain to 5/10 or less at worst to promote ability to stand and walk.    Baseline 10/10 R lateral hip pain at worst (02/20/2017); 9-10/10 R lateral thigh pain at worst for the past 7 days (03/25/2017); 10/10 at worst for the past 7 days (04/03/2017); 9/10 at worst for the past 7 days (04/29/2017)   Time 4   Period Weeks   Status On-going     PT LONG TERM GOAL #4   Title  Patient will be able to turn 360 degrees safely and independently without LOB to promote ability to perform standing tasks.    Baseline Able to turn 360 degrees to the R but felt dizziness at the end and needed assist to maintain balance (02/20/2017); Dizziness with turning 360 degrees (04/25/2017)   Time 6   Period Weeks   Status Not Met  PT LONG TERM GOAL #5   Title Patient will be able to ambulate at least 500 ft with SPC on R without LOB and mod I to promote mobility.    Baseline 400 ft with SPC (03/25/2017)   Time 6   Period Weeks   Status Deferred               Plan - 05/14/17 0946    Clinical Impression Statement Continued working on gentle trunk strengthening exercises in a more upright and less R side bending position unsupported. No decrease in symptoms with unsupported sitting today. Pt demonstrates tendency to not perform HEP properly so occasional review of HEP needed. Decreased R lateral thigh pain with increased gentle activation of his glute max and adductor muscles (to decrease R piriformis muscle activation).    History and Personal Factors relevant to plan of care: Chronicity of condition and scoliosis, age, difficulty tolerating too much activity, pacemaker present   Clinical Presentation Evolving   Clinical Presentation due to: Sometimes R lateral thigh pain improves, sometimes it does not.    Clinical Decision Making Low   Rehab Potential Fair   Clinical Impairments Affecting Rehab Potential Chronicity of condition and scoliosis, age, difficulty tolerating too much activity, pacemaker present   PT Frequency 2x / week   PT Duration 4 weeks   PT Treatment/Interventions Therapeutic activities;Therapeutic exercise;Neuromuscular re-education;Patient/family education;Manual techniques;Dry needling;Gait training;Stair training;Functional mobility training;Aquatic Therapy   PT Next Visit Plan posture, scapular, hip strengthening, gait, balance   Consulted and Agree  with Plan of Care Patient      Patient will benefit from skilled therapeutic intervention in order to improve the following deficits and impairments:  Pain, Postural dysfunction, Improper body mechanics, Decreased strength, Difficulty walking, Decreased range of motion, Decreased endurance, Decreased balance  Visit Diagnosis: Pain in right thigh  Difficulty in walking, not elsewhere classified  Muscle weakness (generalized)     Problem List Patient Active Problem List   Diagnosis Date Noted  . Encounter for preventive health examination 03/30/2017  . Generalized muscle weakness 07/24/2016  . Major depressive disorder, recurrent episode ([place]) 01/24/2016  . Vitamin D deficiency 01/24/2016  . Fatigue 01/23/2016  . Benign essential HTN 03/04/2015  . H/O prosthetic heart valve 03/04/2015  . Symptomatic bradycardia 02/21/2015  . Episodic lightheadedness 02/21/2015  . Exertional dyspnea 06/25/2014  . Cardiomyopathy ([place]) 03/09/2014  . Second degree AV block, Mobitz type I 11/20/2013  . Secondary cardiomyopathy ([place]) 11/18/2013  . Hyperlipidemia 04/27/2013  . Sciatica of right side 03/16/2013  . Other and unspecified hyperlipidemia 03/16/2013  . Anemia, iron deficiency 09/15/2012  . Osteoporosis, senile 09/15/2012  . Atrial flutter ([place]) 06/26/2012  . Depression 12/24/2011  . Hypertension 08/25/2011  . Neuropathy of lower extremity 08/25/2011  . Bradycardia 08/25/2011  . Atrial fibrillation ([place])   . History of mitral valve replacement with mechanical valve   . Long term current use of anticoagulant therapy 08/22/2011    Joneen Boers PT, DPT   05/14/2017, 11:45 AM  [facility] [address], [place], [address] Phone: [phone]   Fax:  [phone]  Name: Kevin [name] MRN: [id number] Date of Birth: 1925-01-28

## 2017-05-20 ENCOUNTER — Ambulatory Visit: Payer: Medicare Other

## 2017-05-20 DIAGNOSIS — M79651 Pain in right thigh: Secondary | ICD-10-CM | POA: Diagnosis not present

## 2017-05-20 DIAGNOSIS — M6281 Muscle weakness (generalized): Secondary | ICD-10-CM

## 2017-05-20 DIAGNOSIS — R262 Difficulty in walking, not elsewhere classified: Secondary | ICD-10-CM

## 2017-05-20 LAB — PROTIME-INR

## 2017-05-20 NOTE — Therapy (Signed)
[facility] [address] [place], [address] Phone: [phone]   Fax:  [phone]  Physical Therapy Treatment And Progress Report   Patient Details  Name: Kevin [name] MRN: [id number] Date of Birth: 1925-11-08 Referring Provider: Deborra Medina, MD  Encounter Date: 05/20/2017      PT End of Session - 05/20/17 1121    Visit Number 21   Number of Visits 29   Date for PT Re-Evaluation 05/30/17   Authorization Type 4   Authorization Time Period of 10   PT Start Time 1121   PT Stop Time 1209   PT Time Calculation (min) 48 min   Activity Tolerance Patient tolerated treatment well   Behavior During Therapy [facility] for tasks assessed/performed      Past Medical History:  Diagnosis Date  . Atrial fibrillation ([place])   . Cancer ([place])    melanoma- right ear  . Chronic headaches started age 70  . GERD (gastroesophageal reflux disease)   . Glaucoma   . History of mitral valve replacement with mechanical valve   . Hypertension   . Osteoporosis    secondary to low testoerone  . Sciatica   . Sciatica     Past Surgical History:  Procedure Laterality Date  . CARDIOVERSION  2013  . CATARACT EXTRACTION, BILATERAL     x 2  . HERNIA REPAIR     x 2  . MELANOMA EXCISION     right ear  . MITRAL VALVE REPLACEMENT  1994  . NASAL SEPTUM SURGERY  1963  . PERMANENT PACEMAKER INSERTION Left 12/22/2014    There were no vitals filed for this visit.      Subjective Assessment - 05/20/17 1125    Subjective Pt states feeling more tired today than usualy. Everything is a great deal of effort. Has light headedess and dizziness.  7/10 R lateral thigh pain currently (pt sitting on a chair), 9/10 R lateral thigh pain at most for the past 7 days.   Took tylenol and meclizine at 9 am today for his headache and dizziness.  Has been tired for the past few days. Had to sit down more frequently.    Pertinent History Pt states  having R lateral thigh pain which is his main trouble which comes from scoliosis, irriating his nerve coming out of the 5th vertebra. Has been having pain since 4 years ago, gradual onset. Goes to an osteopath in [place], was referred to PT. Had PT for 2 years in [place] until his therapist gave up. Treatments included stretching his R side, bridging, posterior pelvic tilting with deep breaths and exhaling, supine trunk rotation. Pt was told that the exercises will not help him but might keep him from deteriorating further. Feels like his condition is worse currently. Does his HEP 2x/day regularly.  Has been using a SPC for 2 years.   Patient Stated Goals I'd like to walk without a cane or walker, without pain, have more endurance.    Currently in Pain? Yes   Pain Score 7    Pain Onset Today   Pain Onset More than a month ago                                 PT Education - 05/20/17 1130    Education provided Yes   Education Details ther-ex   [employer]) Educated Patient  Methods Explanation;Demonstration;Tactile cues;Verbal cues   Comprehension Returned demonstration;Verbalized understanding        Objectives   There-ex     Vitals obtained. Blood pressure Rarm sitting, mechanically taken: 109/62HR 72  seated bilateral ankle DF/PF 5x3 to promote blood flow  Seated gentle manual resistance to L trunk 5 seconds x 10 for 2sets to promote use of L trunk muscles and gently take off R side bending pressure.     Seated gentle manual perturbation front and back with PT 45seconds (upgraded hold time) x 3to promote trunk control. Pt holding PVC bar with PT providing resistance through bar gently  Rest break provided secondary to fatigue.   Seated supported gentle manual resisted hip adduction with glute max squeeze 5x3. Thighs at neutral.  Seated with back unsupported, pt holding PVC bar with PT providing gentle manual resistance through the  bar:  side bending direction 30 seconds x 4 Then side to side 30 seconds x1  Sitting with back unsupported:   Seated gentle L side bend (sitting, gently pushing R hip onto the chair with upright back) 7x, then 8 times   Blood pressure L arm sitting at end of session, mechanically taken: 116/65, HR 67  Improved exercise technique, movement at target joints, use of target muscles after min to mod verbal, visual, tactile cues.      Decreased pain with seated gentle manually resisted bilateral hip adduction exercise, activating the hip adductors and decreasing piriformis muscle activation. Based on pt reports, pt able to maintain 1 point improvement in worst pain level for the past few weeks (from 10/10 to 9/10 at worst) for his R lateral thigh. Continued working on gentle trunk strengthening and posture as tolerated with rest breaks as needed. Pt also demonstrates increased fatigue however for the past two weeks per reports. Difficulty with increasing activity tolerance due to medical history and age. Pt still demonstrates R lateral thigh pain, weakness, difficulty walking longer distances and performing functional tasks and would benefit from continued skilled physical therapy services to address the aforementioned deficits.               PT Long Term Goals - 05/20/17 1208      PT LONG TERM GOAL #1   Title Patient will be able to ambulate at least 350 ft with SPC on R without LOB and mod I to promote mobility.    Baseline Pt able to ambulate 222 ft with SPC on R, CGA to SBA (02/20/2017); 400 ft with SPC (03/25/2017)   Time 6   Period Weeks   Status Achieved     PT LONG TERM GOAL #2   Title Patient will improve LEFS score by at least 9 points as a demonstration of improved function.    Baseline 24/80 (02/20/2017); 25/80 (04/03/2017); 23/80 (04/25/2017)   Time 4   Period Weeks   Status On-going     PT LONG TERM GOAL #3   Title Patient will have a  decrease in R lateral thigh pain to 5/10 or less at worst to promote ability to stand and walk.    Baseline 10/10 R lateral hip pain at worst (02/20/2017); 9-10/10 R lateral thigh pain at worst for the past 7 days (03/25/2017); 10/10 at worst for the past 7 days (04/03/2017); 9/10 at worst for the past 7 days (04/29/2017); 9/10 at worst for the past 7 days (05/20/2017)   Time 4   Period Weeks   Status On-going     PT LONG  TERM GOAL #4   Title Patient will be able to turn 360 degrees safely and independently without LOB to promote ability to perform standing tasks.    Baseline Able to turn 360 degrees to the R but felt dizziness at the end and needed assist to maintain balance (02/20/2017); Dizziness with turning 360 degrees (04/25/2017)   Time 6   Period Weeks   Status Not Met     PT LONG TERM GOAL #5   Title Patient will be able to ambulate at least 500 ft with [facility] on R without LOB and mod I to promote mobility.    Baseline 400 ft with SPC (03/25/2017)   Time 6   Period Weeks   Status Deferred               Plan - 05/20/17 1150    Clinical Impression Statement Decreased pain with seated gentle manually resisted bilateral hip adduction exercise, activating the hip adductors and decreasing piriformis muscle activation. Based on pt reports, pt able to maintain 1 point improvement in worst pain level for the past few weeks (from 10/10 to 9/10 at worst) for his R lateral thigh. Continued working on gentle trunk strengthening and posture as tolerated with rest breaks as needed. Pt also demonstrates increased fatigue however for the past two weeks per reports. Difficulty with increasing activity tolerance due to medical history and age. Pt still demonstrates R lateral thigh pain, weakness, difficulty walking longer distances and performing functional tasks and would benefit from continued skilled physical therapy services to address the aforementioned deficits.    History and Personal Factors  relevant to plan of care: Chronicity of condition and scoliosis, age, difficulty tolerating too much activity, pacemaker present   Clinical Presentation Evolving   Clinical Presentation due to: Sometimes R lateral thigh pain improves, sometimes it does not. Increasing fatigue. Past medical history.    Clinical Decision Making Moderate   Rehab Potential Fair   Clinical Impairments Affecting Rehab Potential Chronicity of condition and scoliosis, age, difficulty tolerating too much activity, pacemaker present   PT Frequency 2x / week   PT Duration 4 weeks   PT Treatment/Interventions Therapeutic activities;Therapeutic exercise;Neuromuscular re-education;Patient/family education;Manual techniques;Dry needling;Gait training;Stair training;Functional mobility training;Aquatic Therapy   PT Next Visit Plan posture, scapular, hip strengthening, gait, balance   Consulted and Agree with Plan of Care Patient      Patient will benefit from skilled therapeutic intervention in order to improve the following deficits and impairments:  Pain, Postural dysfunction, Improper body mechanics, Decreased strength, Difficulty walking, Decreased range of motion, Decreased endurance, Decreased balance  Visit Diagnosis: Difficulty in walking, not elsewhere classified  Muscle weakness (generalized)  Pain in right thigh     Problem List Patient Active Problem List   Diagnosis Date Noted  . Encounter for preventive health examination 03/30/2017  . Generalized muscle weakness 07/24/2016  . Major depressive disorder, recurrent episode ([place]) 01/24/2016  . Vitamin D deficiency 01/24/2016  . Fatigue 01/23/2016  . Benign essential HTN 03/04/2015  . H/O prosthetic heart valve 03/04/2015  . Symptomatic bradycardia 02/21/2015  . Episodic lightheadedness 02/21/2015  . Exertional dyspnea 06/25/2014  . Cardiomyopathy ([place]) 03/09/2014  . Second degree AV block, Mobitz type I 11/20/2013  . Secondary cardiomyopathy ([place])  11/18/2013  . Hyperlipidemia 04/27/2013  . Sciatica of right side 03/16/2013  . Other and unspecified hyperlipidemia 03/16/2013  . Anemia, iron deficiency 09/15/2012  . Osteoporosis, senile 09/15/2012  . Atrial flutter ([place]) 06/26/2012  . Depression 12/24/2011  .  Hypertension 08/25/2011  . Neuropathy of lower extremity 08/25/2011  . Bradycardia 08/25/2011  . Atrial fibrillation ([place])   . History of mitral valve replacement with mechanical valve   . Long term current use of anticoagulant therapy 08/22/2011    Thank you for your referral.  Joneen Boers PT, DPT   05/20/2017, 1:19 PM  [facility] [address] [place], [address] Phone: [phone]   Fax:  [phone]  Name: MACLAIN COHRON MRN: [id number] Date of Birth: 06/02/1925

## 2017-05-21 ENCOUNTER — Telehealth: Payer: Self-pay

## 2017-05-21 NOTE — Telephone Encounter (Signed)
Received pt's INR results.   INR:    3.8  PT:      37.0  Lab result has been placed in yellow results folder.

## 2017-05-21 NOTE — Telephone Encounter (Signed)
INR is slightly high confirm current dose so I can adjust

## 2017-05-21 NOTE — Telephone Encounter (Signed)
Spoke with pt and he stated that he is alternating 3 and 4 mg tablets daily.

## 2017-05-21 NOTE — Telephone Encounter (Signed)
Patient notified

## 2017-05-21 NOTE — Telephone Encounter (Signed)
Reduce dose to 3 mg daily recheck iNR in 2 weeks

## 2017-05-22 ENCOUNTER — Ambulatory Visit (INDEPENDENT_AMBULATORY_CARE_PROVIDER_SITE_OTHER): Payer: Medicare Other | Admitting: Internal Medicine

## 2017-05-22 ENCOUNTER — Encounter: Payer: Self-pay | Admitting: Internal Medicine

## 2017-05-22 VITALS — BP 96/54 | HR 73 | Temp 97.5°F | Resp 14 | Ht 67.0 in | Wt 144.0 lb

## 2017-05-22 DIAGNOSIS — F329 Major depressive disorder, single episode, unspecified: Secondary | ICD-10-CM

## 2017-05-22 DIAGNOSIS — Z7901 Long term (current) use of anticoagulants: Secondary | ICD-10-CM

## 2017-05-22 DIAGNOSIS — M6281 Muscle weakness (generalized): Secondary | ICD-10-CM | POA: Diagnosis not present

## 2017-05-22 DIAGNOSIS — R42 Dizziness and giddiness: Secondary | ICD-10-CM | POA: Diagnosis not present

## 2017-05-22 MED ORDER — ESCITALOPRAM OXALATE 10 MG PO TABS: 10.0000 mg | ORAL_TABLET | Freq: Every day | ORAL | 3 refills | Status: DC

## 2017-05-22 NOTE — Progress Notes (Signed)
Subjective:  Patient ID: Kevin [name], male    DOB: 10/03/1925  Age: 83 y.o. MRN: [id number]  CC: The primary encounter diagnosis was Vertigo. Diagnoses of Reactive depression, Generalized muscle weakness, and Long term current use of anticoagulant therapy were also pertinent to this visit.  HPI Kevin [name] presents for follow up on depression secondary to chronic pain and decreased mobility.   Gabapentin added but not tolerated due to sedation   Not taking tylenol more than once or twice daily for pain .   Cc:  Dizziness persistent  ,  Despite reduction in tylenol dose .  One severe episode occurred while readying for bed.  Using meclizine 25 mg prn .  Vertigo has been occurring daily for the past several weeks,  no double vision .  Headache pattern has not become more frequent , still occurring 3 times per week .  HAS NOT SEEN ENT YET  HAS BEEN SEEING PT SINCE LAST VISIT  . NOT GETTING ANY BETTER . USING A CANE In the  APARTMENT,  AND a rolling WALKER FOR OUTSIDE THE APT. No recent falls but very fearful of  FALLING  LEXPRO trial DISCUSSED  AND AGREED  ENT REFerral discussed      Outpatient Medications Prior to Visit  Medication Sig Dispense Refill  . ALPHA LIPOIC ACID PO Take by mouth daily.    Marland Kitchen amLODipine (NORVASC) 5 MG tablet Take 1 tablet (5 mg total) by mouth daily. 30 tablet 5  . Ascorbic Acid (VITAMIN C) 100 MG tablet Take 100 mg by mouth daily.    . brimonidine (ALPHAGAN) 0.15 % ophthalmic solution 2 (two) times daily.     . Calcium-Magnesium-Vitamin D (CALCIUM MAGNESIUM PO) Take by mouth 2 (two) times daily.    . cholecalciferol (VITAMIN D) 1000 units tablet Take 1,000 Units by mouth daily.    Marland Kitchen co-enzyme Q-10 30 MG capsule Take 1 capsule (30 mg total) by mouth daily. 90 capsule 3  . ferrous sulfate 324 (65 FE) MG TBEC Take 1 tablet by mouth every other day.    . fluocinonide cream (LIDEX) 0.96 % Apply 1 application topically 2 (two) times daily.  0  . latanoprost  (XALATAN) 0.005 % ophthalmic solution daily.     Marland Kitchen losartan (COZAAR) 100 MG tablet TAKE 1 TABLET BY MOUTH EVERY DAY 90 tablet 1  . meclizine (ANTIVERT) 25 MG tablet Take 25 mg by mouth 3 (three) times daily as needed for dizziness.    . metoprolol tartrate (LOPRESSOR) 25 MG tablet TAKE 1 TABLET BY MOUTH TWICE DAILY 60 tablet 6  . Multiple Vitamin (MULTIVITAMIN) tablet Take 1 tablet by mouth daily.      . Nutritional Supplements (MELATONIN PO) Take by mouth.    . Omega-3 Fatty Acids (OMEGA 3 PO) Take by mouth.      [place] Calcium 500 MG TABS Take two by mouth daily     . Red Yeast Rice Extract (RED YEAST RICE PO) Take by mouth.      . RUTIN PO Take by mouth.    . SYRINGE-NEEDLE, DISP, 3 ML (SAFETY-LOK 3CC SYR 22GX1.5") 22G X 1-1/2" 3 ML MISC 2 ml's monthly-given 1 shot monthly     . TURMERIC PO Take by mouth.      . warfarin (COUMADIN) 3 MG tablet TAKE 1 TABLET BY MOUTH ON TUESDAY, THURSDAY, SATURDAY, AND SUNDAY (Patient taking differently: TAKE 1 TABLET BY MOUTH DAILY) 51 tablet 1  . 5-Hydroxytryptophan (5-HTP  PO) Take by mouth daily.    Marland Kitchen warfarin (COUMADIN) 2 MG tablet TAKE 2 TABLETS BY MOUTH ON ALL DAYS, BUT TAKE 1 AND 1/2 TABLETS ON WEDNESDAY (Patient not taking: Reported on 05/22/2017) 180 tablet 0  . Coenzyme Q10-Levocarnitine (CO Q-10 PLUS PO) Take 1 capsule by mouth daily.    . DHA-EPA-Vit B6-B12-Folic Acid (CARDIOVID PLUS) CAPS Take 1 capsule by mouth 2 (two) times daily.     . diphenoxylate-atropine (LOMOTIL) 2.5-0.025 MG tablet TK 1 T PO QID PRF DIARRHEA  0  . gabapentin (NEURONTIN) 100 MG capsule Take 1 capsule (100 mg total) by mouth 3 (three) times daily. (Patient not taking: Reported on 05/22/2017) 90 capsule 3  . Guggulipid (GUGULIPID PO) Take by mouth daily.    Marland Kitchen ketoconazole (NIZORAL) 2 % cream Apply 1 application topically 2 (two) times daily.     Marland Kitchen KETOPROFEN-KETAMINE-LIDOCAINE EX Apply 1 application topically 2 (two) times daily.    . L-GLUTAMINE PO Take by mouth.       . losartan (COZAAR) 100 MG tablet TAKE 1 TABLET BY MOUTH EVERY DAY (Patient not taking: Reported on 05/22/2017) 90 tablet 2  . LUMIGAN 0.01 % SOLN 1 drop at bedtime.     . meclizine (ANTIVERT) 12.5 MG tablet Take by mouth.    . NON FORMULARY Memory Essentials 2 tablets BID.    Marland Kitchen NON FORMULARY Pro Liver Milk Thistle and Acetyl-Cysteine 1 tablet daily.    Marland Kitchen omeprazole (PRILOSEC) 40 MG capsule TAKE 1 CAPSULE(40 MG) BY MOUTH DAILY (Patient not taking: Reported on 05/22/2017) 90 capsule 0   No facility-administered medications prior to visit.     Review of Systems;  Patient denies headache, fevers, malaise, unintentional weight loss, skin rash, eye pain, sinus congestion and sinus pain, sore throat, dysphagia,  hemoptysis , cough, dyspnea, wheezing, chest pain, palpitations, orthopnea, edema, abdominal pain, nausea, melena, diarrhea, constipation, flank pain, dysuria, hematuria, urinary  Frequency, nocturia, numbness, tingling, seizures,  Focal weakness, Loss of consciousness,  Tremor, insomnia, depression, anxiety, and suicidal ideation.      Objective:  BP (!) 96/54 (BP Location: Left Arm, Patient Position: Sitting, Cuff Size: Normal)   Pulse 73   Temp 97.5 F (36.4 C) (Oral)   Resp 14   Ht 5\' 7"  (1.702 m)   Wt 144 lb (65.3 kg)   SpO2 95%   BMI 22.55 kg/m   BP Readings from Last 3 Encounters:  05/22/17 (!) 96/54  05/09/17 (!) 141/114  03/27/17 114/66    Wt Readings from Last 3 Encounters:  05/22/17 144 lb (65.3 kg)  03/27/17 149 lb 6.4 oz (67.8 kg)  01/21/17 148 lb (67.1 kg)    General appearance: alert, cooperative and appears stated age Ears: normal TM's and external ear canals both ears Throat: lips, mucosa, and tongue normal; teeth and gums normal Neck: no adenopathy, no carotid bruit, supple, symmetrical, trachea midline and thyroid not enlarged, symmetric, no tenderness/mass/nodules Back: symmetric, no curvature. ROM normal. No CVA tenderness. Lungs: clear to  auscultation bilaterally Heart: regular rate and rhythm, S1, S2 normal, no murmur, click, rub or gallop Abdomen: soft, non-tender; bowel sounds normal; no masses,  no organomegaly Pulses: 2+ and symmetric Skin: Skin color, texture, turgor normal. No rashes or lesions Lymph nodes: Cervical, supraclavicular, and axillary nodes normal.  No results found for: HGBA1C  Lab Results  Component Value Date   CREATININE 0.92 01/21/2017   CREATININE 0.86 07/23/2016   CREATININE 0.9 01/19/2016    Lab Results  Component  Value Date   WBC 4.6 01/21/2017   HGB 11.7 (L) 01/21/2017   HCT 34.2 (L) 01/21/2017   PLT 212.0 01/21/2017   GLUCOSE 80 01/21/2017   CHOL 196 01/19/2016   TRIG 67 01/19/2016   HDL 59 01/19/2016   LDLDIRECT 105.9 03/13/2012   LDLCALC 124 01/19/2016   ALT 20 01/21/2017   AST 31 01/21/2017   NA 133 (L) 01/21/2017   K 4.3 01/21/2017   CL 98 01/21/2017   CREATININE 0.92 01/21/2017   BUN 18 01/21/2017   CO2 30 01/21/2017   TSH 1.56 01/21/2017   INR 3.4 (A) 03/26/2017     Assessment & Plan:   Problem List Items Addressed This Visit    Vertigo - Primary    Given his increased risk of falls, recommending ENT referral       Relevant Orders   Ambulatory referral to ENT   Long term current use of anticoagulant therapy    Dose reduced to 3 mg daily for INR 3.4 repeat INR in 2 weeks       Generalized muscle weakness    Secondary to neuromuscular degeneration, deconditioning, etc.  Has reached maximal benefit with PT      Depression    Trial of lexapro .  starting dose 5 mg with increase to 10 mg if tolerated.  rtc one month        Relevant Medications   escitalopram (LEXAPRO) 10 MG tablet      A total of 25 minutes of face to face time was spent with patient more than half of which was spent in counselling about the above mentioned conditions  and coordination of care   I have discontinued Mr. Jemison's L-GLUTAMINE PO, DHA-EPA-Vit B6-B12-Folic Acid, LUMIGAN, NON  FORMULARY, NON FORMULARY, Guggulipid (GUGULIPID PO), ketoconazole, KETOPROFEN-KETAMINE-LIDOCAINE EX, gabapentin, Coenzyme Q10-Levocarnitine (CO Q-10 PLUS PO), diphenoxylate-atropine, and omeprazole. I am also having him start on escitalopram. Additionally, I am having him maintain his SYRINGE-NEEDLE (DISP) 3 ML, [place] Calcium, Omega-3 Fatty Acids (OMEGA 3 PO), Red Yeast Rice Extract (RED YEAST RICE PO), TURMERIC PO, multivitamin, brimonidine, latanoprost, Nutritional Supplements (MELATONIN PO), 5-Hydroxytryptophan (5-HTP PO), Calcium-Magnesium-Vitamin D (CALCIUM MAGNESIUM PO), ALPHA LIPOIC ACID PO, vitamin C, RUTIN PO, ferrous sulfate, co-enzyme Q-10, losartan, cholecalciferol, amLODipine, metoprolol tartrate, warfarin, fluocinonide cream, warfarin, and meclizine.  Meds ordered this encounter  Medications  . escitalopram (LEXAPRO) 10 MG tablet    Sig: Take 1 tablet (10 mg total) by mouth daily.    Dispense:  30 tablet    Refill:  3    Medications Discontinued During This Encounter  Medication Reason  . L-GLUTAMINE PO Patient has not taken in last 30 days  . gabapentin (NEURONTIN) 100 MG capsule Patient has not taken in last 30 days  . meclizine (ANTIVERT) 12.5 MG tablet Patient has not taken in last 30 days  . losartan (COZAAR) 528 MG tablet Duplicate  . Coenzyme Q10-Levocarnitine (CO U-13 PLUS PO) Duplicate  . DHA-EPA-Vit B6-B12-Folic Acid (CARDIOVID PLUS) CAPS Patient has not taken in last 30 days  . diphenoxylate-atropine (LOMOTIL) 2.5-0.025 MG tablet Patient has not taken in last 30 days  . Guggulipid (GUGULIPID PO) Patient has not taken in last 30 days  . ketoconazole (NIZORAL) 2 % cream Patient has not taken in last 30 days  . [place] Patient has not taken in last 30 days  . LUMIGAN 0.01 % SOLN Patient has not taken in last 30 days  . NON FORMULARY Patient has not taken  in last 30 days  . NON FORMULARY Patient has not taken in last 30 days  . omeprazole  (PRILOSEC) 40 MG capsule Patient has not taken in last 30 days    Follow-up: Return in about 4 weeks (around 06/19/2017) for depression .   Crecencio Mc, MD

## 2017-05-22 NOTE — Patient Instructions (Addendum)
You can take up to 2000 mg of acetaminophen  Daily in divided doses to manage your pain   I Am referring you to an Ear nose and throat doctor to evaluate an dtreat your vertigo  I am starting you on Lexapro to help your mood.  Please start the Lexapro (escitalopram) at 1/2 tablet daily in the evening for the first few days to avoid nausea.  You can increase to a full tablet after 4 days if you havenot developed side effects of nausea.  If the lexapro interferes with your sleep, take it in the morning instead  Please return in  4 weeks ,  Reduce your coumadin  dose to 3 mg daily and have your INR recheckied in 2 weeks

## 2017-05-23 NOTE — Assessment & Plan Note (Signed)
Secondary to neuromuscular degeneration, deconditioning, etc.  Has reached maximal benefit with PT

## 2017-05-23 NOTE — Assessment & Plan Note (Signed)
Dose reduced to 3 mg daily for INR 3.4 repeat INR in 2 weeks

## 2017-05-23 NOTE — Assessment & Plan Note (Signed)
Trial of lexapro .  starting dose 5 mg with increase to 10 mg if tolerated.  rtc one month

## 2017-05-23 NOTE — Assessment & Plan Note (Signed)
Given his increased risk of falls, recommending ENT referral

## 2017-05-24 ENCOUNTER — Other Ambulatory Visit: Payer: Self-pay | Admitting: Internal Medicine

## 2017-05-27 ENCOUNTER — Ambulatory Visit: Payer: Medicare Other

## 2017-05-27 DIAGNOSIS — M79651 Pain in right thigh: Secondary | ICD-10-CM

## 2017-05-27 DIAGNOSIS — Z9181 History of falling: Secondary | ICD-10-CM

## 2017-05-27 DIAGNOSIS — R262 Difficulty in walking, not elsewhere classified: Secondary | ICD-10-CM

## 2017-05-27 DIAGNOSIS — M6281 Muscle weakness (generalized): Secondary | ICD-10-CM

## 2017-05-27 NOTE — Therapy (Signed)
[facility] [address] [place], [address] Phone: [phone]   Fax:  [phone]  Physical Therapy  Discharge Summary  Patient Details  Name: Kevin [name] MRN: [id number] Date of Birth: 09-26-1925 Referring Provider: Deborra Medina, MD  Encounter Date: 05/27/2017    Past Medical History:  Diagnosis Date  . Atrial fibrillation ([place])   . Cancer ([place])    melanoma- right ear  . Chronic headaches started age 83  . GERD (gastroesophageal reflux disease)   . Glaucoma   . History of mitral valve replacement with mechanical valve   . Hypertension   . Osteoporosis    secondary to low testoerone  . Sciatica   . Sciatica     Past Surgical History:  Procedure Laterality Date  . CARDIOVERSION  2013  . CATARACT EXTRACTION, BILATERAL     x 2  . HERNIA REPAIR     x 2  . MELANOMA EXCISION     right ear  . MITRAL VALVE REPLACEMENT  1994  . NASAL SEPTUM SURGERY  1963  . PERMANENT PACEMAKER INSERTION Left 12/22/2014    There were no vitals filed for this visit.      Subjective Assessment - 05/27/17 1856    Subjective Per phone conversation with patient: pt states that he cannot tell much difference from PT and his R thigh feels like it's 10/10 currently.  Waiting on a phone call from an ear nose and throat doctor for his dizziness.    Pertinent History Pt states having R lateral thigh pain which is his main trouble which comes from scoliosis, irriating his nerve coming out of the 5th vertebra. Has been having pain since 4 years ago, gradual onset. Goes to an osteopath in [place], was referred to PT. Had PT for 2 years in [place] until his therapist gave up. Treatments included stretching his R side, bridging, posterior pelvic tilting with deep breaths and exhaling, supine trunk rotation. Pt was told that the exercises will not help him but might keep him from deteriorating further. Feels like his condition  is worse currently. Does his HEP 2x/day regularly.  Has been using a SPC for 2 years.   Patient Stated Goals I'd like to walk without a cane or walker, without pain, have more endurance.    Pain Onset Today   Pain Onset More than a month ago                                      PT Long Term Goals - 05/20/17 1208      PT LONG TERM GOAL #1   Title Patient will be able to ambulate at least 350 ft with SPC on R without LOB and mod I to promote mobility.    Baseline Pt able to ambulate 222 ft with SPC on R, CGA to SBA (02/20/2017); 400 ft with SPC (03/25/2017)   Time 6   Period Weeks   Status Achieved     PT LONG TERM GOAL #2   Title Patient will improve LEFS score by at least 9 points as a demonstration of improved function.    Baseline 24/80 (02/20/2017); 25/80 (04/03/2017); 23/80 (04/25/2017)   Time 4   Period Weeks   Status On-going     PT LONG TERM GOAL #3   Title Patient will have a decrease in R lateral  thigh pain to 5/10 or less at worst to promote ability to stand and walk.    Baseline 10/10 R lateral hip pain at worst (02/20/2017); 9-10/10 R lateral thigh pain at worst for the past 7 days (03/25/2017); 10/10 at worst for the past 7 days (04/03/2017); 9/10 at worst for the past 7 days (04/29/2017); 9/10 at worst for the past 7 days (05/20/2017)   Time 4   Period Weeks   Status On-going     PT LONG TERM GOAL #4   Title Patient will be able to turn 360 degrees safely and independently without LOB to promote ability to perform standing tasks.    Baseline Able to turn 360 degrees to the R but felt dizziness at the end and needed assist to maintain balance (02/20/2017); Dizziness with turning 360 degrees (04/25/2017)   Time 6   Period Weeks   Status Not Met     PT LONG TERM GOAL #5   Title Patient will be able to ambulate at least 500 ft with [facility] on R without LOB and mod I to promote mobility.    Baseline 400 ft with SPC (03/25/2017)   Time 6   Period Weeks    Status Deferred               Plan - 06/04/2017 1858    Clinical Impression Statement R lateral thigh pain level varies between 9-10/10 at worst with not much significant change since initial evaluation. Pt was able to ambulate up to 400 ft with SPC in 03/25/2017 but difficulty ambulating long distances recently with pt needing to sit down more frequently based on recent subjective reports. Difficulty with increasing activity tolerance due to medical history, dizziness, and and age. Overall, pt feels that he cannot tell much of a difference with physical therapy. Skilled physical therapy services discharged.    History and Personal Factors relevant to plan of care: Chronicity of condition and scoliosis, age, difficulty tolerating too much activity, pacemaker present   Clinical Presentation Evolving   Clinical Presentation due to: Sometimes R lateral thigh pain improves, sometimes it does not. Increasing fatigue. Past medical history.    Clinical Decision Making Moderate   Rehab Potential Fair   Clinical Impairments Affecting Rehab Potential Chronicity of condition and scoliosis, age, difficulty tolerating too much activity, pacemaker present   PT Frequency --   PT Duration --   PT Treatment/Interventions Therapeutic activities;Therapeutic exercise;Neuromuscular re-education;Patient/family education;Gait training;Manual techniques   PT Next Visit Plan --   Consulted and Agree with Plan of Care Patient      Patient will benefit from skilled therapeutic intervention in order to improve the following deficits and impairments:  Pain, Postural dysfunction, Improper body mechanics, Decreased strength, Difficulty walking, Decreased range of motion, Decreased endurance, Decreased balance  Visit Diagnosis: Muscle weakness (generalized)  Difficulty in walking, not elsewhere classified  Pain in right thigh  History of falling       G-Codes - 06-04-2017 1908    Functional Assessment Tool Used  (Outpatient Only) clinical presentation, patient interview, gait   Functional Limitation Mobility: Walking and moving around   Mobility: Walking and Moving Around Current Status [phone]) At least 60 percent but less than 80 percent impaired, limited or restricted   Mobility: Walking and Moving Around Goal Status [phone]) At least 40 percent but less than 60 percent impaired, limited or restricted      Problem List Patient Active Problem List   Diagnosis Date Noted  . Encounter for  preventive health examination 03/30/2017  . Generalized muscle weakness 07/24/2016  . Major depressive disorder, recurrent episode ([place]) 01/24/2016  . Vitamin D deficiency 01/24/2016  . Fatigue 01/23/2016  . Benign essential HTN 03/04/2015  . H/O prosthetic heart valve 03/04/2015  . Symptomatic bradycardia 02/21/2015  . Vertigo 02/21/2015  . Exertional dyspnea 06/25/2014  . Cardiomyopathy ([place]) 03/09/2014  . Second degree AV block, Mobitz type I 11/20/2013  . Secondary cardiomyopathy ([place]) 11/18/2013  . Hyperlipidemia 04/27/2013  . Sciatica of right side 03/16/2013  . Other and unspecified hyperlipidemia 03/16/2013  . Anemia, iron deficiency 09/15/2012  . Osteoporosis, senile 09/15/2012  . Atrial flutter ([place]) 06/26/2012  . Depression 12/24/2011  . Hypertension 08/25/2011  . Neuropathy of lower extremity 08/25/2011  . Bradycardia 08/25/2011  . Atrial fibrillation ([place])   . History of mitral valve replacement with mechanical valve   . Long term current use of anticoagulant therapy 08/22/2011    Thank you for your referral.   Joneen Boers PT, DPT   05/27/2017, 7:08 PM  [facility] [address], [place], [address] Phone: [phone]   Fax:  [phone]  Name: LAURENT CARGILE MRN: [id number] Date of Birth: 02/03/1925

## 2017-05-29 ENCOUNTER — Ambulatory Visit: Payer: Medicare Other

## 2017-06-03 LAB — PROTIME-INR

## 2017-06-04 ENCOUNTER — Telehealth: Payer: Self-pay

## 2017-06-04 NOTE — Telephone Encounter (Signed)
Pt was notified of results and to continue current coumadin regimen and to repeat INR again in one month.

## 2017-06-04 NOTE — Telephone Encounter (Signed)
Coumadin level is therapeutic,  Continue current regimen and repeat PT/INR in one month 

## 2017-06-04 NOTE — Telephone Encounter (Signed)
INR results:   INR- 3.0      PT- 29.8   Result has been placed in yellow folder.

## 2017-06-06 ENCOUNTER — Other Ambulatory Visit: Payer: Self-pay | Admitting: Cardiovascular Disease

## 2017-06-12 ENCOUNTER — Emergency Department: Payer: Medicare Other

## 2017-06-12 ENCOUNTER — Inpatient Hospital Stay
Admission: EM | Admit: 2017-06-12 | Discharge: 2017-06-14 | DRG: 683 | Disposition: A | Discharge status: Skilled Nursing Facility | Payer: Medicare Other | Attending: Internal Medicine | Admitting: Internal Medicine

## 2017-06-12 ENCOUNTER — Encounter: Payer: Self-pay | Admitting: *Deleted

## 2017-06-12 DIAGNOSIS — K56600 Partial intestinal obstruction, unspecified as to cause: Secondary | ICD-10-CM | POA: Diagnosis present

## 2017-06-12 DIAGNOSIS — Z66 Do not resuscitate: Secondary | ICD-10-CM | POA: Diagnosis present

## 2017-06-12 DIAGNOSIS — Z85820 Personal history of malignant melanoma of skin: Secondary | ICD-10-CM | POA: Diagnosis not present

## 2017-06-12 DIAGNOSIS — R531 Weakness: Secondary | ICD-10-CM | POA: Diagnosis present

## 2017-06-12 DIAGNOSIS — K449 Diaphragmatic hernia without obstruction or gangrene: Secondary | ICD-10-CM | POA: Diagnosis present

## 2017-06-12 DIAGNOSIS — Z79899 Other long term (current) drug therapy: Secondary | ICD-10-CM | POA: Diagnosis not present

## 2017-06-12 DIAGNOSIS — R112 Nausea with vomiting, unspecified: Secondary | ICD-10-CM

## 2017-06-12 DIAGNOSIS — R791 Abnormal coagulation profile: Secondary | ICD-10-CM | POA: Diagnosis present

## 2017-06-12 DIAGNOSIS — R1031 Right lower quadrant pain: Secondary | ICD-10-CM

## 2017-06-12 DIAGNOSIS — K219 Gastro-esophageal reflux disease without esophagitis: Secondary | ICD-10-CM | POA: Diagnosis present

## 2017-06-12 DIAGNOSIS — Z7901 Long term (current) use of anticoagulants: Secondary | ICD-10-CM | POA: Diagnosis not present

## 2017-06-12 DIAGNOSIS — M810 Age-related osteoporosis without current pathological fracture: Secondary | ICD-10-CM | POA: Diagnosis present

## 2017-06-12 DIAGNOSIS — I482 Chronic atrial fibrillation: Secondary | ICD-10-CM | POA: Diagnosis present

## 2017-06-12 DIAGNOSIS — H409 Unspecified glaucoma: Secondary | ICD-10-CM | POA: Diagnosis present

## 2017-06-12 DIAGNOSIS — E860 Dehydration: Secondary | ICD-10-CM | POA: Diagnosis present

## 2017-06-12 DIAGNOSIS — I10 Essential (primary) hypertension: Secondary | ICD-10-CM | POA: Diagnosis present

## 2017-06-12 DIAGNOSIS — N179 Acute kidney failure, unspecified: Secondary | ICD-10-CM | POA: Diagnosis present

## 2017-06-12 DIAGNOSIS — Z952 Presence of prosthetic heart valve: Secondary | ICD-10-CM

## 2017-06-12 DIAGNOSIS — R109 Unspecified abdominal pain: Secondary | ICD-10-CM

## 2017-06-12 DIAGNOSIS — A059 Bacterial foodborne intoxication, unspecified: Secondary | ICD-10-CM | POA: Diagnosis present

## 2017-06-12 DIAGNOSIS — Z950 Presence of cardiac pacemaker: Secondary | ICD-10-CM

## 2017-06-12 LAB — CBC WITH DIFFERENTIAL/PLATELET
BASOS ABS: 0 10*3/uL (ref 0–0.1)
Basophils Relative: 0 %
EOS PCT: 1 %
Eosinophils Absolute: 0.1 10*3/uL (ref 0–0.7)
HCT: 34.1 % — ABNORMAL LOW (ref 40.0–52.0)
Hemoglobin: 12.1 g/dL — ABNORMAL LOW (ref 13.0–18.0)
LYMPHS PCT: 11 %
Lymphs Abs: 0.6 10*3/uL — ABNORMAL LOW (ref 1.0–3.6)
MCH: 34.8 pg — ABNORMAL HIGH (ref 26.0–34.0)
MCHC: 35.3 g/dL (ref 32.0–36.0)
MCV: 98.5 fL (ref 80.0–100.0)
MONO ABS: 1 10*3/uL (ref 0.2–1.0)
Monocytes Relative: 20 %
Neutro Abs: 3.3 10*3/uL (ref 1.4–6.5)
Neutrophils Relative %: 68 %
PLATELETS: 221 10*3/uL (ref 150–440)
RBC: 3.47 MIL/uL — ABNORMAL LOW (ref 4.40–5.90)
RDW: 13.7 % (ref 11.5–14.5)
WBC: 4.9 10*3/uL (ref 3.8–10.6)

## 2017-06-12 LAB — URINALYSIS, COMPLETE (UACMP) WITH MICROSCOPIC
BILIRUBIN URINE: NEGATIVE
Bacteria, UA: NONE SEEN
GLUCOSE, UA: NEGATIVE mg/dL
HGB URINE DIPSTICK: NEGATIVE
Ketones, ur: NEGATIVE mg/dL
LEUKOCYTES UA: NEGATIVE
NITRITE: NEGATIVE
PH: 5 (ref 5.0–8.0)
Protein, ur: NEGATIVE mg/dL
RBC / HPF: NONE SEEN RBC/hpf (ref 0–5)
SPECIFIC GRAVITY, URINE: 1.018 (ref 1.005–1.030)

## 2017-06-12 LAB — COMPREHENSIVE METABOLIC PANEL
ALT: 45 U/L (ref 17–63)
ANION GAP: 10 (ref 5–15)
AST: 44 U/L — AB (ref 15–41)
Albumin: 3.4 g/dL — ABNORMAL LOW (ref 3.5–5.0)
Alkaline Phosphatase: 44 U/L (ref 38–126)
BUN: 81 mg/dL — AB (ref 6–20)
CHLORIDE: 94 mmol/L — AB (ref 101–111)
CO2: 25 mmol/L (ref 22–32)
Calcium: 8.3 mg/dL — ABNORMAL LOW (ref 8.9–10.3)
Creatinine, Ser: 2.23 mg/dL — ABNORMAL HIGH (ref 0.61–1.24)
GFR calc Af Amer: 28 mL/min — ABNORMAL LOW (ref >60)
GFR, EST NON AFRICAN AMERICAN: 24 mL/min — AB (ref >60)
Glucose, Bld: 160 mg/dL — ABNORMAL HIGH (ref 65–99)
POTASSIUM: 3.6 mmol/L (ref 3.5–5.1)
Sodium: 129 mmol/L — ABNORMAL LOW (ref 135–145)
Total Bilirubin: 0.9 mg/dL (ref 0.3–1.2)
Total Protein: 6 g/dL — ABNORMAL LOW (ref 6.5–8.1)

## 2017-06-12 LAB — PROTIME-INR
INR: 8.05
Prothrombin Time: 70 seconds — ABNORMAL HIGH (ref 11.4–15.2)

## 2017-06-12 LAB — LIPASE, BLOOD: LIPASE: 29 U/L (ref 11–51)

## 2017-06-12 LAB — TROPONIN I: Troponin I: 0.05 ng/mL (ref <0.03)

## 2017-06-12 MED ORDER — VITAMIN C 500 MG/5ML PO SYRP
100.0000 mg | ORAL_SOLUTION | Freq: Every day | ORAL | Status: DC
  Filled 2017-06-12: qty 1

## 2017-06-12 MED ORDER — IOPAMIDOL (ISOVUE-300) INJECTION 61%
30.0000 mL | Freq: Once | INTRAVENOUS | Status: AC
Start: 2017-06-12 — End: 2017-06-12
  Administered 2017-06-12: 30 mL via ORAL

## 2017-06-12 MED ORDER — BRIMONIDINE TARTRATE 0.15 % OP SOLN
1.0000 [drp] | Freq: Two times a day (BID) | OPHTHALMIC | Status: DC
  Administered 2017-06-12 – 2017-06-14 (×4): 1 [drp] via OPHTHALMIC
  Filled 2017-06-12: qty 5

## 2017-06-12 MED ORDER — TRAMADOL HCL 50 MG PO TABS: 50.0000 mg | ORAL_TABLET | Freq: Four times a day (QID) | ORAL | Status: DC | PRN

## 2017-06-12 MED ORDER — SODIUM CHLORIDE 0.9 % IV BOLUS (SEPSIS)
1000.0000 mL | Freq: Once | INTRAVENOUS | Status: AC
  Administered 2017-06-12: 1000 mL via INTRAVENOUS

## 2017-06-12 MED ORDER — PANTOPRAZOLE SODIUM 40 MG PO TBEC
40.0000 mg | DELAYED_RELEASE_TABLET | Freq: Every day | ORAL | Status: DC
  Administered 2017-06-13 – 2017-06-14 (×2): 40 mg via ORAL
  Filled 2017-06-12 (×2): qty 1

## 2017-06-12 MED ORDER — ADULT MULTIVITAMIN W/MINERALS CH
1.0000 | ORAL_TABLET | Freq: Every day | ORAL | Status: DC
  Administered 2017-06-13 – 2017-06-14 (×2): 1 via ORAL
  Filled 2017-06-12 (×2): qty 1

## 2017-06-12 MED ORDER — CALCIUM CARBONATE ANTACID 500 MG PO CHEW
2.0000 | CHEWABLE_TABLET | Freq: Two times a day (BID) | ORAL | Status: DC
  Administered 2017-06-12 – 2017-06-14 (×5): 400 mg via ORAL
  Filled 2017-06-12 (×5): qty 2

## 2017-06-12 MED ORDER — OMEGA-3-ACID ETHYL ESTERS 1 G PO CAPS
1.0000 | ORAL_CAPSULE | Freq: Every day | ORAL | Status: DC
  Administered 2017-06-12 – 2017-06-14 (×3): 1 g via ORAL
  Filled 2017-06-12 (×3): qty 1

## 2017-06-12 MED ORDER — MECLIZINE HCL 25 MG PO TABS
25.0000 mg | ORAL_TABLET | Freq: Three times a day (TID) | ORAL | Status: DC | PRN
  Filled 2017-06-12: qty 1

## 2017-06-12 MED ORDER — VITAMIN D 1000 UNITS PO TABS
1000.0000 [IU] | ORAL_TABLET | Freq: Every day | ORAL | Status: DC
  Administered 2017-06-13 – 2017-06-14 (×2): 1000 [IU] via ORAL
  Filled 2017-06-12 (×2): qty 1

## 2017-06-12 MED ORDER — ONDANSETRON HCL 4 MG/2ML IJ SOLN
4.0000 mg | Freq: Once | INTRAMUSCULAR | Status: AC
  Administered 2017-06-12: 4 mg via INTRAVENOUS
  Filled 2017-06-12: qty 2

## 2017-06-12 MED ORDER — ESCITALOPRAM OXALATE 10 MG PO TABS
10.0000 mg | ORAL_TABLET | Freq: Every day | ORAL | Status: DC
  Administered 2017-06-13 – 2017-06-14 (×2): 10 mg via ORAL
  Filled 2017-06-12 (×2): qty 1

## 2017-06-12 MED ORDER — ONDANSETRON HCL 4 MG/2ML IJ SOLN
4.0000 mg | Freq: Four times a day (QID) | INTRAMUSCULAR | Status: DC | PRN
  Administered 2017-06-12: 4 mg via INTRAVENOUS
  Filled 2017-06-12: qty 2

## 2017-06-12 MED ORDER — FERROUS SULFATE 325 (65 FE) MG PO TABS
325.0000 mg | ORAL_TABLET | ORAL | Status: DC
  Administered 2017-06-12 – 2017-06-14 (×2): 325 mg via ORAL
  Filled 2017-06-12 (×2): qty 1

## 2017-06-12 MED ORDER — SODIUM CHLORIDE 0.9 % IV SOLN
INTRAVENOUS | Status: DC
  Administered 2017-06-12 – 2017-06-13 (×3): via INTRAVENOUS

## 2017-06-12 MED ORDER — ONDANSETRON HCL 4 MG PO TABS: 4.0000 mg | ORAL_TABLET | Freq: Four times a day (QID) | ORAL | Status: DC | PRN

## 2017-06-12 MED ORDER — LATANOPROST 0.005 % OP SOLN
1.0000 [drp] | Freq: Every day | OPHTHALMIC | Status: DC
  Administered 2017-06-12 – 2017-06-13 (×2): 1 [drp] via OPHTHALMIC
  Filled 2017-06-12: qty 2.5

## 2017-06-12 NOTE — Progress Notes (Signed)
Family Meeting Note  Advance Directive: yes  Today a meeting took place with the pt and brother    The following were discussed:Patient's diagnosis: , Patient's progosis: Patient is being admitted with acute gastroenteritis and acute renal failure appears to be developing ischemia. He is stable at present.   CODE STATUS addressed. Patient is a DO NOT RESUSCITATE. He carries out of facility DO NOT RESUSCITATE form.  Time spent during discussion:16 mins Kevin Hausmann, MD

## 2017-06-12 NOTE — ED Notes (Signed)
Dr. Lucita Lora notified of critical trop and INR

## 2017-06-12 NOTE — ED Provider Notes (Signed)
[facility] Emergency Department Provider Note  ____________________________________________   First MD Initiated Contact with Patient 06/12/17 1200     (approximate)  I have reviewed the triage vital signs and the nursing notes.   HISTORY  Chief Complaint Emesis and Abdominal Pain   HPI Kevin [name] is a 83 y.o. male with history of atrial for ablation on Coumadin who is presenting with 3-4 days of nausea and vomiting. He has had 2-3 episodes of vomiting each day. Says that this is associated with some right lower quadrant abdominal soreness/pain. Denies any diarrhea or difficulty with urination. Once EMS arrived they found his blood pressure to be in the 80s which improved to 88 systolic with a small amount of IV fluids. The patient is denying any diarrhea.   Past Medical History:  Diagnosis Date  . Atrial fibrillation ([place])   . Cancer ([place])    melanoma- right ear  . Chronic headaches started age 37  . GERD (gastroesophageal reflux disease)   . Glaucoma   . History of mitral valve replacement with mechanical valve   . Hypertension   . Osteoporosis    secondary to low testoerone  . Sciatica   . Sciatica     Patient Active Problem List   Diagnosis Date Noted  . Encounter for preventive health examination 03/30/2017  . Generalized muscle weakness 07/24/2016  . Major depressive disorder, recurrent episode ([place]) 01/24/2016  . Vitamin D deficiency 01/24/2016  . Fatigue 01/23/2016  . Benign essential HTN 03/04/2015  . H/O prosthetic heart valve 03/04/2015  . Symptomatic bradycardia 02/21/2015  . Vertigo 02/21/2015  . Exertional dyspnea 06/25/2014  . Cardiomyopathy ([place]) 03/09/2014  . Second degree AV block, Mobitz type I 11/20/2013  . Secondary cardiomyopathy ([place]) 11/18/2013  . Hyperlipidemia 04/27/2013  . Sciatica of right side 03/16/2013  . Other and unspecified hyperlipidemia 03/16/2013  . Anemia, iron deficiency 09/15/2012  .  Osteoporosis, senile 09/15/2012  . Atrial flutter ([place]) 06/26/2012  . Depression 12/24/2011  . Hypertension 08/25/2011  . Neuropathy of lower extremity 08/25/2011  . Bradycardia 08/25/2011  . Atrial fibrillation ([place])   . History of mitral valve replacement with mechanical valve   . Long term current use of anticoagulant therapy 08/22/2011    Past Surgical History:  Procedure Laterality Date  . CARDIOVERSION  2013  . CATARACT EXTRACTION, BILATERAL     x 2  . HERNIA REPAIR     x 2  . MELANOMA EXCISION     right ear  . MITRAL VALVE REPLACEMENT  1994  . NASAL SEPTUM SURGERY  1963  . PERMANENT PACEMAKER INSERTION Left 12/22/2014    Prior to Admission medications   Medication Sig Start Date End Date Taking? Authorizing Provider  5-Hydroxytryptophan (5-HTP PO) Take by mouth daily.   Yes [provider]  ALPHA LIPOIC ACID PO Take by mouth daily.   Yes [provider]  amLODipine (NORVASC) 5 MG tablet Take 1 tablet (5 mg total) by mouth daily. 08/17/16  Yes Crecencio Mc, MD  Ascorbic Acid (VITAMIN C) 100 MG tablet Take 100 mg by mouth daily.   Yes [provider]  brimonidine (ALPHAGAN) 0.15 % ophthalmic solution Place 1 drop into both eyes 2 (two) times daily.  08/21/11  Yes [provider]  Calcium-Magnesium-Vitamin D (CALCIUM MAGNESIUM PO) Take by mouth 2 (two) times daily.   Yes [provider]  cholecalciferol (VITAMIN D) 1000 units tablet Take 1,000 Units by mouth daily.  Yes [provider]  co-enzyme Q-10 30 MG capsule Take 1 capsule (30 mg total) by mouth daily. 05/26/15  Yes Crecencio Mc, MD  ferrous sulfate 324 (65 FE) MG TBEC Take 1 tablet by mouth every other day. 06/13/12  Yes Crecencio Mc, MD  fluocinonide cream (LIDEX) 0.17 % Apply 1 application topically 2 (two) times daily. 01/07/17  Yes [provider]  furosemide (LASIX) 20 MG tablet Take 20 mg by mouth daily. 05/26/17  Yes [provider]    latanoprost (XALATAN) 0.005 % ophthalmic solution Place 1 drop into both eyes at bedtime.  08/13/11  Yes [provider]  losartan (COZAAR) 100 MG tablet TAKE 1 TABLET BY MOUTH EVERY DAY 07/17/16  Yes Crecencio Mc, MD  meclizine (ANTIVERT) 25 MG tablet Take 25 mg by mouth 3 (three) times daily as needed for dizziness.   Yes [provider]  metoprolol tartrate (LOPRESSOR) 25 MG tablet TAKE 1 TABLET BY MOUTH TWICE DAILY 06/07/17  Yes Gollan, Kathlene November, MD  Multiple Vitamin (MULTIVITAMIN) tablet Take 1 tablet by mouth daily.     Yes [provider]  Nutritional Supplements (MELATONIN PO) Take by mouth.   Yes [provider]  Omega-3 Fatty Acids (OMEGA 3 PO) Take by mouth.     Yes [provider]  omeprazole (PRILOSEC) 40 MG capsule Take 40 mg by mouth daily. 03/10/17  Yes [provider]  [place] Calcium 500 MG TABS Take two by mouth daily    Yes [provider]  Red Yeast Rice Extract (RED YEAST RICE PO) Take by mouth.     Yes [provider]  RUTIN PO Take by mouth.   Yes [provider]  TURMERIC PO Take by mouth.     Yes [provider]  escitalopram (LEXAPRO) 10 MG tablet Take 1 tablet (10 mg total) by mouth daily. 05/22/17   Crecencio Mc, MD  metoprolol tartrate (LOPRESSOR) 25 MG tablet TAKE 1 TABLET BY MOUTH TWICE DAILY Patient not taking: Reported on 06/12/2017 09/13/16   Minna Merritts, MD  SYRINGE-NEEDLE, DISP, 3 ML (SAFETY-LOK 3CC SYR 22GX1.5") 22G X 1-1/2" 3 ML MISC 2 ml's monthly-given 1 shot monthly     [provider]  warfarin (COUMADIN) 2 MG tablet TAKE 2 TABLETS BY MOUTH ON ALL DAYS, BUT TAKE 1 AND 1/2 TABLETS ON [facility] Patient not taking: Reported on 05/22/2017 04/11/17   Crecencio Mc, MD  warfarin (COUMADIN) 3 MG tablet TAKE 1 TABLET BY MOUTH ON TUESDAY, THURSDAY, SATURDAY, AND SUNDAY 05/24/17   Crecencio Mc, MD    Allergies Cymbalta [duloxetine hcl] and  Aspirin  Family History  Problem Relation Age of Onset  . Cancer Father        unknown  . Alzheimer's disease Brother   . Arrhythmia Brother   . Mental illness Mother     Social History Social History  Substance Use Topics  . Smoking status: Never Smoker  . Smokeless tobacco: Never Used  . Alcohol use No    Review of Systems  Constitutional: No fever/chills Eyes: No visual changes. ENT: No sore throat. Cardiovascular: Denies chest pain. Respiratory: Denies shortness of breath. Gastrointestinal:  No diarrhea.  No constipation. Genitourinary: Negative for dysuria. Musculoskeletal: Negative for back pain. Skin: Negative for rash. Neurological: Negative for headaches, focal weakness or numbness.   ____________________________________________   PHYSICAL EXAM:  VITAL SIGNS: ED Triage Vitals  Enc Vitals Group     BP  06/12/17 1204 (!) 97/53     Pulse Rate 06/12/17 1204 70     Resp 06/12/17 1204 16     Temp 06/12/17 1204 97.6 F (36.4 C)     Temp Source 06/12/17 1204 Oral     SpO2 06/12/17 1204 97 %     Weight 06/12/17 1201 144 lb (65.3 kg)     Height 06/12/17 1201 5\' 7"  (1.702 m)     Head Circumference --      Peak Flow --      Pain Score 06/12/17 1200 4     Pain Loc --      Pain Edu? --      Excl. in [place]? --     Constitutional: Alert and oriented. Well appearing and in no acute distress. Eyes: Conjunctivae are normal.  Head: Atraumatic. Nose: No congestion/rhinnorhea. Mouth/Throat: Mucous membranes are moist.  Neck: No stridor.   Cardiovascular: Normal rate, regular rhythm. Grossly normal heart sounds.   Respiratory: Normal respiratory effort.  No retractions. Lungs CTAB. Gastrointestinal: Soft With mild right lower quadrant tenderness to palpation. No distention.  Musculoskeletal: No lower extremity tenderness nor edema.  No joint effusions. Neurologic:  Normal speech and language. No gross focal neurologic deficits are appreciated. Skin:  Skin is warm,  dry and intact. No rash noted. Psychiatric: Mood and affect are normal. Speech and behavior are normal.  ____________________________________________   LABS (all labs ordered are listed, but only abnormal results are displayed)  Labs Reviewed  CBC WITH DIFFERENTIAL/PLATELET - Abnormal; Notable for the following:       Result Value   RBC 3.47 (*)    Hemoglobin 12.1 (*)    HCT 34.1 (*)    MCH 34.8 (*)    Lymphs Abs 0.6 (*)    All other components within normal limits  COMPREHENSIVE METABOLIC PANEL - Abnormal; Notable for the following:    Sodium 129 (*)    Chloride 94 (*)    Glucose, Bld 160 (*)    BUN 81 (*)    Creatinine, Ser 2.23 (*)    Calcium 8.3 (*)    Total Protein 6.0 (*)    Albumin 3.4 (*)    AST 44 (*)    GFR calc non Af Amer 24 (*)    GFR calc Af Amer 28 (*)    All other components within normal limits  TROPONIN I - Abnormal; Notable for the following:    Troponin I 0.05 (*)    All other components within normal limits  PROTIME-INR - Abnormal; Notable for the following:    Prothrombin Time 70.0 (*)    INR 8.05 (*)    All other components within normal limits  LIPASE, BLOOD  URINALYSIS, COMPLETE (UACMP) WITH MICROSCOPIC   ____________________________________________  EKG  ED ECG REPORT I, Doran Stabler, the attending physician, personally viewed and interpreted this ECG.   Date: 06/12/2017  EKG Time: 1202  Rate: 75  Rhythm: Ventricular paced rhythm  Intervals:Wide complex consistent with ventricular pacing  ST&T Change: ST and T-wave changes consistent with ventricular pacing. Intervention previous. Last EKG on 08/15/2016 ____________________________________________  RADIOLOGY  Pending CT scan ____________________________________________   PROCEDURES  Procedure(s) performed:   Procedures  Critical Care performed:   ____________________________________________   INITIAL IMPRESSION / ASSESSMENT AND PLAN / ED COURSE  Pertinent labs  & imaging results that were available during my care of the patient were reviewed by me and considered in my medical decision making (see chart for  details).  ----------------------------------------- 1:22 PM on 06/12/2017 -----------------------------------------  Patient found to be in acute renal failure with a prerenal pattern. Patient receiving IV fluids at this time. Also with elevated INR likely secondary to the patient's renal failure. I discussed the lab results with the patient as well as his brother who is at the bedside. Anderson the need for admission to the hospital secondary to the lab abnormalities. I discussed the case with Dr. Posey Pronto of the medicine service will be admitting the patient. She recommends not giving any Coumadin reversal at this time and because the patient is not bleeding the level will be trended. Patient is understanding of the plan and willing to comply.      ____________________________________________   FINAL CLINICAL IMPRESSION(S) / ED DIAGNOSES  Final diagnoses:  Abdominal pain  Vomiting. Acute renal failure. Coumadin coagulopathy.    NEW MEDICATIONS STARTED DURING THIS VISIT:  New Prescriptions   No medications on file     Note:  This document was prepared using Dragon voice recognition software and may include unintentional dictation errors.     Orbie Pyo, MD 06/12/17 1323

## 2017-06-12 NOTE — ED Notes (Signed)
Pt resting in bed drinking contrast, family at bedside

## 2017-06-12 NOTE — ED Triage Notes (Signed)
Pt arrives via EMS from home, states vomiting and nausea, EMS reports initial BP 86/46 and 66/42 standing, NS bolus given, denies any diarrhea, states decreased PO intake over the past few days, awake and alert, EDP at bedside

## 2017-06-12 NOTE — H&P (Signed)
[facility] NAME: Kevin [name]    MR#:  [id number]  DATE OF BIRTH:  12-16-1924  DATE OF ADMISSION:  06/12/2017  PRIMARY CARE PHYSICIAN: Crecencio Mc, MD   REQUESTING/REFERRING PHYSICIAN: Dr. Clearnce Hasten  CHIEF COMPLAINT:   Vomiting on and off since Saturday. Patient feels weak. HISTORY OF PRESENT ILLNESS:  Kevin [name]  is a 83 y.o. male with a known history of Atrial fibrillation on Coumadin, GERD, history of mechanical mitral valve replacement comes to the emergency room from belligerent [facility] after he started having intermittent episodes of nausea and vomiting. Patient said he does not recall what he ate on Saturday that his stomach got upset and since then has been having episodes of vomiting. Denies any fever. Denies any abdominal pain however does have some cramping. His last arterial was yesterday evening. Patient feels weak. He was found to be clinically dehydrated with creatinine of 2.2 . Baseline creatinine of 0.9. Patient just came back from CT of the abdomen. Results pending. His INR is 8. There is no history of any GI bleeding Patient is being admitted with acute gastroenteritis and acute renal failure. He is getting IV fluids in the emergency room  PAST MEDICAL HISTORY:   Past Medical History:  Diagnosis Date  . Atrial fibrillation ([place])   . Cancer ([place])    melanoma- right ear  . Chronic headaches started age 83  . GERD (gastroesophageal reflux disease)   . Glaucoma   . History of mitral valve replacement with mechanical valve   . Hypertension   . Osteoporosis    secondary to low testoerone  . Sciatica   . Sciatica     PAST SURGICAL HISTOIRY:   Past Surgical History:  Procedure Laterality Date  . CARDIOVERSION  2013  . CATARACT EXTRACTION, BILATERAL     x 2  . HERNIA REPAIR     x 2  . MELANOMA EXCISION     right ear  . MITRAL VALVE REPLACEMENT  1994  . NASAL SEPTUM SURGERY  1963  .  PERMANENT PACEMAKER INSERTION Left 12/22/2014    SOCIAL HISTORY:   Social History  Substance Use Topics  . Smoking status: Never Smoker  . Smokeless tobacco: Never Used  . Alcohol use No    FAMILY HISTORY:   Family History  Problem Relation Age of Onset  . Cancer Father        unknown  . Alzheimer's disease Brother   . Arrhythmia Brother   . Mental illness Mother     DRUG ALLERGIES:   Allergies  Allergen Reactions  . Cymbalta [Duloxetine Hcl] Other (See Comments)    Urinary retention, constipation and insomnia  . Aspirin     Heart operation was told to never take Aspirin    REVIEW OF SYSTEMS:  Review of Systems  Constitutional: Positive for malaise/fatigue. Negative for chills, fever and weight loss.  HENT: Negative for ear discharge, ear pain and nosebleeds.   Eyes: Negative for blurred vision, pain and discharge.  Respiratory: Negative for sputum production, shortness of breath, wheezing and stridor.   Cardiovascular: Negative for chest pain, palpitations, orthopnea and PND.  Gastrointestinal: Positive for diarrhea, nausea and vomiting. Negative for abdominal pain.  Genitourinary: Negative for frequency and urgency.  Musculoskeletal: Negative for back pain and joint pain.  Neurological: Positive for weakness. Negative for sensory change, speech change and focal weakness.  Psychiatric/Behavioral: Negative for depression and hallucinations. The patient is not nervous/anxious.  MEDICATIONS AT HOME:   Prior to Admission medications   Medication Sig Start Date End Date Taking? Authorizing Provider  5-Hydroxytryptophan (5-HTP PO) Take by mouth daily.   Yes [provider]  ALPHA LIPOIC ACID PO Take by mouth daily.   Yes [provider]  amLODipine (NORVASC) 5 MG tablet Take 1 tablet (5 mg total) by mouth daily. 08/17/16  Yes Crecencio Mc, MD  Ascorbic Acid (VITAMIN C) 100 MG tablet Take 100 mg by mouth daily.   Yes [provider]   brimonidine (ALPHAGAN) 0.15 % ophthalmic solution Place 1 drop into both eyes 2 (two) times daily.  08/21/11  Yes [provider]  Calcium-Magnesium-Vitamin D (CALCIUM MAGNESIUM PO) Take by mouth 2 (two) times daily.   Yes [provider]  cholecalciferol (VITAMIN D) 1000 units tablet Take 1,000 Units by mouth daily.   Yes [provider]  co-enzyme Q-10 30 MG capsule Take 1 capsule (30 mg total) by mouth daily. 05/26/15  Yes Crecencio Mc, MD  escitalopram (LEXAPRO) 10 MG tablet Take 1 tablet (10 mg total) by mouth daily. 05/22/17  Yes Crecencio Mc, MD  ferrous sulfate 324 (65 FE) MG TBEC Take 1 tablet by mouth every other day. 06/13/12  Yes Crecencio Mc, MD  fluocinonide cream (LIDEX) 6.29 % Apply 1 application topically 2 (two) times daily. 01/07/17  Yes [provider]  furosemide (LASIX) 20 MG tablet Take 20 mg by mouth daily. 05/26/17  Yes [provider]  latanoprost (XALATAN) 0.005 % ophthalmic solution Place 1 drop into both eyes at bedtime.  08/13/11  Yes [provider]  losartan (COZAAR) 100 MG tablet TAKE 1 TABLET BY MOUTH EVERY DAY 07/17/16  Yes Crecencio Mc, MD  meclizine (ANTIVERT) 25 MG tablet Take 25 mg by mouth 3 (three) times daily as needed for dizziness.   Yes [provider]  metoprolol tartrate (LOPRESSOR) 25 MG tablet TAKE 1 TABLET BY MOUTH TWICE DAILY 06/07/17  Yes Gollan, Kathlene November, MD  Multiple Vitamin (MULTIVITAMIN) tablet Take 1 tablet by mouth daily.     Yes [provider]  Nutritional Supplements (MELATONIN PO) Take by mouth.   Yes [provider]  Omega-3 Fatty Acids (OMEGA 3 PO) Take by mouth.     Yes [provider]  omeprazole (PRILOSEC) 40 MG capsule Take 40 mg by mouth daily. 03/10/17  Yes [provider]  [place] Calcium 500 MG TABS Take two by mouth daily    Yes [provider]  Red Yeast Rice Extract (RED YEAST RICE PO) Take by mouth.     Yes  [provider]  RUTIN PO Take by mouth.   Yes [provider]  TURMERIC PO Take by mouth.     Yes [provider]  warfarin (COUMADIN) 3 MG tablet TAKE 1 TABLET BY MOUTH ON TUESDAY, [place], [place], AND SUNDAY 05/24/17  Yes Crecencio Mc, MD  SYRINGE-NEEDLE, DISP, 3 ML (SAFETY-LOK 3CC SYR 22GX1.5") 22G X 1-1/2" 3 ML MISC 2 ml's monthly-given 1 shot monthly     [provider]      VITAL SIGNS:  Blood pressure 104/86, pulse 64, temperature 97.6 F (36.4 C), temperature source Oral, resp. rate (!) 21, height 5\' 7"  (1.702 m), weight 65.3 kg (144 lb), SpO2 90 %.  PHYSICAL EXAMINATION:  GENERAL:  83 y.o.-year-old patient lying in the bed with no acute distress.  EYES: Pupils equal, round, reactive to light and accommodation. No  scleral icterus. Extraocular muscles intact.  HEENT: Head atraumatic, normocephalic. Oropharynx and nasopharynx clear. Oral mucosa dry NECK:  Supple, no jugular venous distention. No thyroid enlargement, no tenderness.  LUNGS: Normal breath sounds bilaterally, no wheezing, rales,rhonchi or crepitation. No use of accessory muscles of respiration.  CARDIOVASCULAR: S1, S2 normal. No murmurs, rubs, or gallops.  ABDOMEN: Soft, nontender, nondistended. Bowel sounds present. No organomegaly or mass.  EXTREMITIES: No pedal edema, cyanosis, or clubbing.  NEUROLOGIC: Cranial nerves II through XII are intact. Muscle strength 5/5 in all extremities. Sensation intact. Gait not checked. Subjective weakness PSYCHIATRIC: The patient is alert and oriented x 3.  SKIN: No obvious rash, lesion, or ulcer.   LABORATORY PANEL:   CBC  Recent Labs Lab 06/12/17 1203  WBC 4.9  HGB 12.1*  HCT 34.1*  PLT 221   ------------------------------------------------------------------------------------------------------------------  Chemistries   Recent Labs Lab 06/12/17 1203  NA 129*  K 3.6  CL 94*  CO2 25  GLUCOSE 160*  BUN 81*  CREATININE  2.23*  CALCIUM 8.3*  AST 44*  ALT 45  ALKPHOS 44  BILITOT 0.9   ------------------------------------------------------------------------------------------------------------------  Cardiac Enzymes  Recent Labs Lab 06/12/17 1203  TROPONINI 0.05*   ------------------------------------------------------------------------------------------------------------------  RADIOLOGY:  Ct Abdomen Pelvis Wo Contrast  Result Date: 06/12/2017 CLINICAL DATA:  3-4 days with nausea and vomiting. 2-3 episodes of vomiting each day. EXAM: CT ABDOMEN AND PELVIS WITHOUT CONTRAST TECHNIQUE: Multidetector CT imaging of the abdomen and pelvis was performed following the standard protocol without IV contrast. COMPARISON:  Chest CT 06/04/2012 FINDINGS: Lower chest: Again noted is a calcified granuloma in the lingula. Stable small pleural-based nodular density in the right middle lobe on sequence 3, image 6. No large pleural effusions. There is a prosthetic mitral valve and cardiac lead extending into the right ventricle. Patient now has a massive hiatal hernia which has markedly progressed since 2013. Hepatobiliary: No acute abnormality of the liver or gallbladder on this noncontrast examination. Pancreas: Normal appearance of the pancreas without inflammation or duct dilatation. Spleen: Normal appearance of spleen without enlargement. Adrenals/Urinary Tract: There are at least 3 exophytic cysts involving the left kidney. Largest cyst is in left interpolar region measuring up to 7.5 cm. Fluid in the urinary bladder. Negative for hydronephrosis. Probable cyst in the right kidney interpolar region which is poorly characterized on this noncontrast examination. Stomach/Bowel: Majority of the stomach is now herniating into the chest. There is oral contrast within the stomach and proximal small bowel. Dilated loops of small bowel in the abdomen measuring up to 4.2 cm. Rectum is distended with gas. Extensive diverticulosis in the  sigmoid colon. Distal small bowel is decompressed compared to the dilated loops of small bowel. There is not an obvious transitional point between dilated and nondilated small bowel. Vascular/Lymphatic: Abdominal aorta is heavily calcified without aneurysm. No significant lymph node enlargement in the abdomen or pelvis. Reproductive: Calcifications in the prostate gland. Other: Surgical mesh material in the anterior lower pelvis and lower abdomen. No evidence for free fluid or free air. Musculoskeletal: Levoscoliosis of thoracolumbar spine. Multilevel degenerative disease in lumbar spine. IMPRESSION: Large hiatal hernia with dilated loops of small bowel. Findings are suggestive for at least a partial small bowel obstruction. The bowel gas pattern is well demonstrated on the topogram images and could be followed with abdominal radiographs. Renal cysts. Scoliosis with multilevel degenerative bone disease. Postsurgical changes in the chest. Electronically Signed   By: Markus Daft M.D.   On: 06/12/2017 13:52  EKG:  Ventricular paced rhythm  IMPRESSION AND PLAN:   Kevin [name]  is a 83 y.o. male with a known history of Atrial fibrillation on Coumadin, GERD, history of mechanical mitral valve replacement comes to the emergency room from belligerent [facility] after he started having intermittent episodes of nausea and vomiting.  1. Acute gastroenteritis suspected food poisoning -Admit to medical floor -IV fluids -Clear liquid diet -When necessary Zofran  2. Acute renal failure appears prerenal  in the setting of gastroenteritis -IV fluids -Baseline creatinine of 0.9 -Came in with creatinine of 2.2 --- IV fluids -Avoid nephrotoxins  3. Large hiatal hernia with dilated loops of small bowel/partial small bowel obstruction as noted on CT abdomen -No signs of acute abdomen clinically -Consider surgical consultation if symptoms do not improve -Continue supportive care  4. Supratherapeutic INR -INR of  8.0. -Patient has no active bleeding hemoglobin is stable will let INR drift down on its own -Daily PT/INR  5. DVT prophylaxis -Supratherapeutic INR  Above was discussed with patient and patient's brother was in the emergency room. Patient is from [place] of [place]     All the records are reviewed and case discussed with ED provider. Management plans discussed with the patient, family and they are in agreement.  CODE STATUS: DNR (Out of facility yellow form)  TOTAL TIME TAKING CARE OF THIS PATIENT: 50 minutes.    Kevin [name] M.D on 06/12/2017 at 2:10 PM  Between 7am to 6pm - Pager - [phone]  After 6pm go to [email or web address] - password [email or web address] [facility]  [facility]  Office  [phone]  CC: Primary care physician; Crecencio Mc, MD

## 2017-06-13 LAB — PROTIME-INR
INR: 9.3 — AB
PROTHROMBIN TIME: 78.6 s — AB (ref 11.4–15.2)

## 2017-06-13 LAB — BASIC METABOLIC PANEL
Anion gap: 5 (ref 5–15)
BUN: 64 mg/dL — ABNORMAL HIGH (ref 6–20)
CHLORIDE: 98 mmol/L — AB (ref 101–111)
CO2: 27 mmol/L (ref 22–32)
CREATININE: 1.24 mg/dL (ref 0.61–1.24)
Calcium: 7.8 mg/dL — ABNORMAL LOW (ref 8.9–10.3)
GFR, EST AFRICAN AMERICAN: 58 mL/min — AB (ref >60)
GFR, EST NON AFRICAN AMERICAN: 50 mL/min — AB (ref >60)
Glucose, Bld: 142 mg/dL — ABNORMAL HIGH (ref 65–99)
POTASSIUM: 3.1 mmol/L — AB (ref 3.5–5.1)
SODIUM: 130 mmol/L — AB (ref 135–145)

## 2017-06-13 MED ORDER — VITAMIN C 250 MG PO TABS
125.0000 mg | ORAL_TABLET | Freq: Every day | ORAL | Status: DC
  Filled 2017-06-13: qty 1

## 2017-06-13 MED ORDER — PHYTONADIONE 5 MG PO TABS
5.0000 mg | ORAL_TABLET | Freq: Once | ORAL | Status: AC
  Administered 2017-06-13: 5 mg via ORAL
  Filled 2017-06-13 (×2): qty 1

## 2017-06-13 MED ORDER — POTASSIUM CHLORIDE CRYS ER 20 MEQ PO TBCR
20.0000 meq | EXTENDED_RELEASE_TABLET | ORAL | Status: AC
  Administered 2017-06-13 (×2): 20 meq via ORAL
  Filled 2017-06-13 (×2): qty 1

## 2017-06-13 MED ORDER — VITAMIN C 250 MG PO TABS
250.0000 mg | ORAL_TABLET | Freq: Every day | ORAL | Status: DC
  Administered 2017-06-13 – 2017-06-14 (×2): 250 mg via ORAL
  Filled 2017-06-13 (×2): qty 1

## 2017-06-13 NOTE — Progress Notes (Signed)
[facility] [place] NAME: Orvil Faraone    MR#:  [id number]  DATE OF BIRTH:  1925-12-02  SUBJECTIVE:   Came in with vomiting and abdominal discomfort. Feels much better. No vomiting. Wants to try soft diet. REVIEW OF SYSTEMS:   Review of Systems  Constitutional: Negative for chills, fever and weight loss.  HENT: Negative for ear discharge, ear pain and nosebleeds.   Eyes: Negative for blurred vision, pain and discharge.  Respiratory: Negative for sputum production, shortness of breath, wheezing and stridor.   Cardiovascular: Negative for chest pain, palpitations, orthopnea and PND.  Gastrointestinal: Positive for nausea. Negative for abdominal pain, diarrhea and vomiting.  Genitourinary: Negative for frequency and urgency.  Musculoskeletal: Negative for back pain and joint pain.  Neurological: Positive for weakness. Negative for sensory change, speech change and focal weakness.  Psychiatric/Behavioral: Negative for depression and hallucinations. The patient is not nervous/anxious.    Tolerating Diet:cld Tolerating PT: pending  DRUG ALLERGIES:   Allergies  Allergen Reactions  . Cymbalta [Duloxetine Hcl] Other (See Comments)    Urinary retention, constipation and insomnia  . Aspirin     Heart operation was told to never take Aspirin    VITALS:  Blood pressure (!) 94/40, pulse 64, temperature 99.3 F (37.4 C), temperature source Oral, resp. rate 18, height 5\' 7"  (1.702 m), weight 65.3 kg (144 lb), SpO2 95 %.  PHYSICAL EXAMINATION:   Physical Exam  GENERAL:  83 y.o.-year-old patient lying in the bed with no acute distress.  EYES: Pupils equal, round, reactive to light and accommodation. No scleral icterus. Extraocular muscles intact.  HEENT: Head atraumatic, normocephalic. Oropharynx and nasopharynx clear. Oral mucosa dry. NECK:  Supple, no jugular venous distention. No thyroid enlargement, no tenderness.  LUNGS: Normal  breath sounds bilaterally, no wheezing, rales, rhonchi. No use of accessory muscles of respiration.  CARDIOVASCULAR: S1, S2 normal. No murmurs, rubs, or gallops.  ABDOMEN: Soft, nontender, mild distended. Bowel sounds present. No organomegaly or mass.  EXTREMITIES: No cyanosis, clubbing or edema b/l.    NEUROLOGIC: Cranial nerves II through XII are intact. No focal Motor or sensory deficits b/l.   PSYCHIATRIC:  patient is alert and oriented x 3.  SKIN: No obvious rash, lesion, or ulcer.   LABORATORY PANEL:  CBC  Recent Labs Lab 06/12/17 1203  WBC 4.9  HGB 12.1*  HCT 34.1*  PLT 221    Chemistries   Recent Labs Lab 06/12/17 1203 06/13/17 0459  NA 129* 130*  K 3.6 3.1*  CL 94* 98*  CO2 25 27  GLUCOSE 160* 142*  BUN 81* 64*  CREATININE 2.23* 1.24  CALCIUM 8.3* 7.8*  AST 44*  --   ALT 45  --   ALKPHOS 44  --   BILITOT 0.9  --    Cardiac Enzymes  Recent Labs Lab 06/12/17 1203  TROPONINI 0.05*   RADIOLOGY:  Ct Abdomen Pelvis Wo Contrast  Result Date: 06/12/2017 CLINICAL DATA:  3-4 days with nausea and vomiting. 2-3 episodes of vomiting each day. EXAM: CT ABDOMEN AND PELVIS WITHOUT CONTRAST TECHNIQUE: Multidetector CT imaging of the abdomen and pelvis was performed following the standard protocol without IV contrast. COMPARISON:  Chest CT 06/04/2012 FINDINGS: Lower chest: Again noted is a calcified granuloma in the lingula. Stable small pleural-based nodular density in the right middle lobe on sequence 3, image 6. No large pleural effusions. There is a prosthetic mitral valve and cardiac lead extending into the right  ventricle. Patient now has a massive hiatal hernia which has markedly progressed since 2013. Hepatobiliary: No acute abnormality of the liver or gallbladder on this noncontrast examination. Pancreas: Normal appearance of the pancreas without inflammation or duct dilatation. Spleen: Normal appearance of spleen without enlargement. Adrenals/Urinary Tract: There are  at least 3 exophytic cysts involving the left kidney. Largest cyst is in left interpolar region measuring up to 7.5 cm. Fluid in the urinary bladder. Negative for hydronephrosis. Probable cyst in the right kidney interpolar region which is poorly characterized on this noncontrast examination. Stomach/Bowel: Majority of the stomach is now herniating into the chest. There is oral contrast within the stomach and proximal small bowel. Dilated loops of small bowel in the abdomen measuring up to 4.2 cm. Rectum is distended with gas. Extensive diverticulosis in the sigmoid colon. Distal small bowel is decompressed compared to the dilated loops of small bowel. There is not an obvious transitional point between dilated and nondilated small bowel. Vascular/Lymphatic: Abdominal aorta is heavily calcified without aneurysm. No significant lymph node enlargement in the abdomen or pelvis. Reproductive: Calcifications in the prostate gland. Other: Surgical mesh material in the anterior lower pelvis and lower abdomen. No evidence for free fluid or free air. Musculoskeletal: Levoscoliosis of thoracolumbar spine. Multilevel degenerative disease in lumbar spine. IMPRESSION: Large hiatal hernia with dilated loops of small bowel. Findings are suggestive for at least a partial small bowel obstruction. The bowel gas pattern is well demonstrated on the topogram images and could be followed with abdominal radiographs. Renal cysts. Scoliosis with multilevel degenerative bone disease. Postsurgical changes in the chest. Electronically Signed   By: Markus Daft M.D.   On: 06/12/2017 13:52   ASSESSMENT AND PLAN:   Carver Murakami  is a 83 y.o. male with a known history of Atrial fibrillation on Coumadin, GERD, history of mechanical mitral valve replacement comes to the emergency room from belligerent [facility] after he started having intermittent episodes of nausea and vomiting.  1. Acute gastroenteritis suspected food poisoning -IV  fluids -Clear liquid diet---advanced to soft diet -When necessary Zofran  2. Acute renal failure appears prerenal  in the setting of gastroenteritis -IV fluids -Baseline creatinine of 0.9 -Came in with creatinine of 2.2 --- IV fluids--1.24 -Avoid nephrotoxins  3. Large hiatal hernia with dilated loops of small bowel/partial small bowel obstruction as noted on CT abdomen -No signs of acute abdomen clinically -Consider surgical consultation if symptoms do not improve -Continue supportive care  4. Supratherapeutic INR -INR of 8.0---9.3---vitamin K 5 mg --- -Patient has no active bleeding hemoglobin is stable will let INR drift down on its own -Daily PT/INR  5. DVT prophylaxis -Supratherapeutic INR  Case discussed with Care Management/Social Worker. Management plans discussed with the patient, family and they are in agreement.  CODE STATUS: DO NOT RESUSCITATE  DVT Prophylaxis: Supratherapeutic INR  TOTAL TIME TAKING CARE OF THIS PATIENT: 25 minutes.  >50% time spent on counselling and coordination of care  POSSIBLE D/C IN 1-2 DAYS, DEPENDING ON CLINICAL CONDITION.  Note: This dictation was prepared with Dragon dictation along with smaller phrase technology. Any transcriptional errors that result from this process are unintentional.  Dashea Mcmullan M.D on 06/13/2017 at 11:39 AM  Between 7am to 6pm - Pager - [phone]  After 6pm go to [email or web address] - password [email or web address] [facility]  Office  [phone]  CC: Primary care physician; Crecencio Mc, MD

## 2017-06-13 NOTE — Care Management (Addendum)
Physical therapy consult pending until patient's INR decreases to an a therapeutic level.  At present it is 9.30.  Resides at [facility].  Patient lives alone.  he would be agreeable to short term skilled nursing placement if absolutely necessary.  He says his weakness at present is extreme.

## 2017-06-13 NOTE — Clinical Social Work Note (Signed)
CSW received consult that patient is from [place].  CSW contacted [facility] and patient is from [place].  CSW to follow pending PT recommendations.  Jones Broom. [place], MSW, [place]  06/13/2017 10:02 AM

## 2017-06-13 NOTE — Progress Notes (Signed)
PT Cancellation Note  Patient Details Name: TRAVAUGHN VUE MRN: [id number] DOB: [birth year]   Cancelled Treatment:    Reason Eval/Treat Not Completed: Other (comment) Consult received and chart reviewed. Pt currently not appropriate for PT, with INR labs at 9.30. Will re-attempt, when medically appropriate. Will hold therapy consult at this time.    Bevelyn Ngo 06/13/2017, 8:50 AM

## 2017-06-13 NOTE — Progress Notes (Deleted)
Pt to be discharged back to [facility] today. Iv and telemetry box removed. Prepared packet to accompany. faacility to transport.

## 2017-06-14 ENCOUNTER — Telehealth: Payer: Self-pay | Admitting: Internal Medicine

## 2017-06-14 ENCOUNTER — Encounter
Admission: RE | Admit: 2017-06-14 | Discharge: 2017-06-14 | Disposition: A | Discharge status: Home / Self Care | Payer: Medicare Other | Source: Ambulatory Visit | Attending: Internal Medicine | Admitting: Internal Medicine

## 2017-06-14 LAB — BASIC METABOLIC PANEL
ANION GAP: 6 (ref 5–15)
BUN: 39 mg/dL — ABNORMAL HIGH (ref 6–20)
CALCIUM: 7.8 mg/dL — AB (ref 8.9–10.3)
CO2: 25 mmol/L (ref 22–32)
Chloride: 101 mmol/L (ref 101–111)
Creatinine, Ser: 0.84 mg/dL (ref 0.61–1.24)
Glucose, Bld: 130 mg/dL — ABNORMAL HIGH (ref 65–99)
POTASSIUM: 3.5 mmol/L (ref 3.5–5.1)
Sodium: 132 mmol/L — ABNORMAL LOW (ref 135–145)

## 2017-06-14 LAB — PROTIME-INR
INR: 5.58 — AB
Prothrombin Time: 52.2 seconds — ABNORMAL HIGH (ref 11.4–15.2)

## 2017-06-14 MED ORDER — LOSARTAN POTASSIUM 100 MG PO TABS: 50.0000 mg | ORAL_TABLET | Freq: Every day | ORAL | 1 refills | Status: DC

## 2017-06-14 MED ORDER — FUROSEMIDE 20 MG PO TABS: 20.0000 mg | ORAL_TABLET | Freq: Every day | ORAL | 2 refills | Status: DC

## 2017-06-14 NOTE — Progress Notes (Signed)
PT Cancellation Note  Patient Details Name: Kevin [name] MRN: [id number] DOB: 08-02-1925   Cancelled Treatment:    Reason Eval/Treat Not Completed: Other (comment) Consult received and chart reviewed. Pt continues to have elevated INR levels of 5.58, and is not appropriate for PT treatment at this time. Will re-attempt, when pt is medically stable. Will hold therapy consult at this time.    Bevelyn Ngo 06/14/2017, 8:36 AM

## 2017-06-14 NOTE — Discharge Instructions (Signed)
PT/INR to be checked daily at the skilled facility and once INR is between 2 and 3 resume Coumadin and pharmacy to monitor the dose.

## 2017-06-14 NOTE — Progress Notes (Signed)
MD aware of PT/INR.

## 2017-06-14 NOTE — Evaluation (Signed)
Physical Therapy Evaluation Patient Details Name: Kevin [name] MRN: [id number] DOB: September 15, 1925 Today's Date: 06/14/2017   History of Present Illness  Pt is a plesant 83 yo M, admitted to acute care on 7/4 with dx of acute renal failure after presenting to ER with excessive vomiting. Prior to admission pt ModI with all ADL's, and received help intermittently from brother for IADL's. Pt utilized rollator for long distance amb and SPC for shorter distances. Pt driving prior to admission. PMH includes: cancer, chronic HA, GERD, glaucoma, hx of mitral valve replacement with mechanics valve, HTN, OP, sciatica, and hx atrial for ablation.   Clinical Impression  Pt is a pleasant 83 y.o. M, admitted to acute care with dx of acute renal failure after presenting to ER with excessive vomiting. Pt currently with INR ;ab values at 5.58; values discussed with Dr. Posey Pronto and pt was cleared for PT eval/treat. Pt performs bed mobility with minA for supine to sit due to generalized weakness and reports of dizziness. Pt with impaired balance and safety awareness with sitting, with tendency to lean posteriorly; required min verbal cues and eduation for erect posture and use of B UE's/LE's for safety. Tranfers performed with minA from bedside commode to bed due to generalized muscle weakness. Ambulation not performed secondary to c/o of nausea upon sitting; nausea did not resolve once in supine position; nursing notified on mobility status and persistent nausea. Pt presents with the following deficits: strength and endurance. Overall, pt responded well to today's treatment with no adverse affects. Pt would benefit from skilled PT to address the previously mentioned impairments and promote return to PLOF. Currently recommending SN, pending d/c.     Follow Up Recommendations SNF    Equipment Recommendations  None recommended by PT    Recommendations for Other Services       Precautions / Restrictions  Precautions Precautions: Fall Restrictions Weight Bearing Restrictions: No      Mobility  Bed Mobility Overal bed mobility: Needs Assistance Bed Mobility: Supine to Sit;Sit to Supine     Supine to sit: Min guard Sit to supine: Min assist   General bed mobility comments: Pt requires minA for physical assistance with supine to sit, requiring min verbal cues for correct sitting with erect posture and with feet on the floor to maintain safety. Pt with c/o dizziness and nausea upon sitting, which did not improve until return to supine. Once supine, pt with no c/o dizziness, but nausea persitent.   Transfers Overall transfer level: Needs assistance   Transfers: Sit to/from Stand Sit to Stand: Min assist         General transfer comment: Pt transfering from bedside commode to bed with CNA upon arrival, requiring minA secondary to reported LE weakness. Pt with poor safety awareness during transfer.   Ambulation/Gait             General Gait Details: Not attempted due to pt c/o of nausea.  Stairs            Wheelchair Mobility    Modified Rankin (Stroke Patients Only)       Balance Overall balance assessment: Needs assistance Sitting-balance support: Bilateral upper extremity supported;Feet supported Sitting balance-Leahy Scale: Fair Sitting balance - Comments: Pt with c/o of dizziness in seated position and tendency to lean posteriorly. CGA assist and min verbal cues were required for pt to maintain erect and safe sitting balance.    Standing balance support:  (Not attempted due to pt c/o nausea)  Pertinent Vitals/Pain Pain Assessment: No/denies pain    Home Living Family/patient expects to be discharged to:: Skilled nursing facility Living Arrangements: Alone Available Help at Discharge: Family Type of Home: Independent living facility Home Access: Level entry     Home Layout: One level Home Equipment:  Environmental consultant - 4 wheels;Cane - single point      Prior Function Level of Independence: Independent with assistive device(s)               Hand Dominance        Extremity/Trunk Assessment   Upper Extremity Assessment Upper Extremity Assessment: Generalized weakness (MMT to B UE's grossly 4-/5)    Lower Extremity Assessment Lower Extremity Assessment: Generalized weakness (MMT to B LE's grossly 4/5)       Communication   Communication: HOH (Hears better in R ear. )  Cognition Arousal/Alertness: Awake/alert Behavior During Therapy: WFL for tasks assessed/performed (talkative) Overall Cognitive Status: Within Functional Limits for tasks assessed                                        General Comments      Exercises Other Exercises Other Exercises: Supine therex performed to B LE's with supervision x10 reps: ankle pumps, SLR, heel slides, hip abd. Pt with SOB, and c/o fatigue following each exercises; required rest break between each exercise.    Assessment/Plan    PT Assessment Patient needs continued PT services  PT Problem List Decreased strength;Decreased activity tolerance;Decreased balance;Decreased mobility;Decreased coordination;Decreased safety awareness       PT Treatment Interventions DME instruction;Gait training;Stair training;Therapeutic activities;Functional mobility training;Therapeutic exercise;Balance training;Patient/family education    PT Goals (Current goals can be found in the Care Plan section)  Acute Rehab PT Goals Patient Stated Goal: to get stronger PT Goal Formulation: With patient Time For Goal Achievement: 06/28/17 Potential to Achieve Goals: Good Additional Goals Additional Goal #1: Pt to report no nausea with nausea with bed mobility and transfers for return to PLOF.     Frequency     Barriers to discharge        Co-evaluation               AM-PAC PT "6 Clicks" Daily Activity  Outcome Measure Difficulty  turning over in bed (including adjusting bedclothes, sheets and blankets)?: Total Difficulty moving from lying on back to sitting on the side of the bed? : Total Difficulty sitting down on and standing up from a chair with arms (e.g., wheelchair, bedside commode, etc,.)?: Total Help needed moving to and from a bed to chair (including a wheelchair)?: Total Help needed walking in hospital room?: Total Help needed climbing 3-5 steps with a railing? : Total 6 Click Score: 6    End of Session   Activity Tolerance: Other (comment) (limited by nausea) Patient left: in bed;with call bell/phone within reach;with bed alarm set Nurse Communication: Mobility status PT Visit Diagnosis: Unsteadiness on feet (R26.81);Other abnormalities of gait and mobility (R26.89);Muscle weakness (generalized) (M62.81)    Time: 1025-1050 PT Time Calculation (min) (ACUTE ONLY): 25 min   Charges:         PT G Codes:        Oran Rein PT, SPT  Bevelyn Ngo 06/14/2017, 11:54 AM

## 2017-06-14 NOTE — Care Management Important Message (Signed)
Important Message  Patient Details  Name: Kevin [name] MRN: [id number] Date of Birth: 07-26-1925   Medicare Important Message Given:  Yes Signed IM notice given    Katrina Stack, RN 06/14/2017, 7:54 AM

## 2017-06-14 NOTE — Telephone Encounter (Signed)
Patient is being discharged to skill nursing facility at the [facility] for rehab will continue to follow for TCM and will schedule once patient is released. FYI

## 2017-06-14 NOTE — Telephone Encounter (Signed)
[facility] called needing to make a 1 week HFU for pt. Pt was in for acute renal failure. Pt is being discharged today and is going to Rehab at [facility]. Please advise, thank you!

## 2017-06-14 NOTE — NC FL2 (Signed)
[place] LEVEL OF CARE SCREENING TOOL     IDENTIFICATION  Patient Name: Kevin [name] Birthdate: Jul 01, 1925 Sex: male Admission Date (Current Location): 06/12/2017  [place] and [place] Number:  Engineering geologist and Address:  [facility], [address] [place], [place] [address]      Provider Number: [id number]  Attending Physician Name and Address:  Fritzi Mandes, MD  Relative Name and Phone Number:  [place], [place] [phone]     Current Level of Care: Hospital Recommended Level of Care: [place] Prior Approval Number:    Date Approved/Denied:   PASRR Number: [id number]  Discharge Plan: SNF    Current Diagnoses: Patient Active Problem List   Diagnosis Date Noted  . Acute renal failure (ARF) ([place]) 06/12/2017  . Encounter for preventive health examination 03/30/2017  . Generalized muscle weakness 07/24/2016  . Major depressive disorder, recurrent episode ([place]) 01/24/2016  . Vitamin D deficiency 01/24/2016  . Fatigue 01/23/2016  . Benign essential HTN 03/04/2015  . H/O prosthetic heart valve 03/04/2015  . Symptomatic bradycardia 02/21/2015  . Vertigo 02/21/2015  . Exertional dyspnea 06/25/2014  . Cardiomyopathy ([place]) 03/09/2014  . Second degree AV block, Mobitz type I 11/20/2013  . Secondary cardiomyopathy ([place]) 11/18/2013  . Hyperlipidemia 04/27/2013  . Sciatica of right side 03/16/2013  . Other and unspecified hyperlipidemia 03/16/2013  . Anemia, iron deficiency 09/15/2012  . Osteoporosis, senile 09/15/2012  . Atrial flutter ([place]) 06/26/2012  . Depression 12/24/2011  . Hypertension 08/25/2011  . Neuropathy of lower extremity 08/25/2011  . Bradycardia 08/25/2011  . Atrial fibrillation ([place])   . History of mitral valve replacement with mechanical valve   . Long term current use of anticoagulant therapy 08/22/2011    Orientation RESPIRATION BLADDER Height & Weight     Self, Time,  Situation, Place  Normal Continent Weight: 144 lb (65.3 kg) Height:  5\' 7"  (170.2 cm)  BEHAVIORAL SYMPTOMS/MOOD NEUROLOGICAL BOWEL NUTRITION STATUS      Continent Diet (Soft regular diet)  AMBULATORY STATUS COMMUNICATION OF NEEDS Skin   Limited Assist Verbally Normal                       Personal Care Assistance Level of Assistance  Bathing, Feeding, Dressing Bathing Assistance: Limited assistance Feeding assistance: Independent Dressing Assistance: Limited assistance     Functional Limitations Info  Sight, Hearing, Speech Sight Info: Adequate Hearing Info: Adequate Speech Info: Adequate    SPECIAL CARE FACTORS FREQUENCY  PT (By licensed PT)     PT Frequency: 5x a week              Contractures Contractures Info: Not present    Additional Factors Info  Code Status, Psychotropic, Allergies Code Status Info: DNR Allergies Info: CYMBALTA DULOXETINE HCL, ASPIRIN  Psychotropic Info: escitalopram (LEXAPRO) tablet 10 mg         Current Medications (06/14/2017):  This is the current hospital active medication list Current Facility-Administered Medications  Medication Dose Route Frequency Provider Last Rate Last Dose  . brimonidine (ALPHAGAN) 0.15 % ophthalmic solution 1 drop  1 drop Both Eyes BID Fritzi Mandes, MD   1 drop at 06/14/17 [phone]  . calcium carbonate (TUMS - dosed in mg elemental calcium) chewable tablet 400 mg of elemental calcium  2 tablet Oral BID Fritzi Mandes, MD   400 mg of elemental calcium at 06/14/17 0841  . cholecalciferol (VITAMIN D) tablet 1,000 Units  1,000 Units Oral Daily  Fritzi Mandes, MD   1,000 Units at 06/14/17 [phone]  . escitalopram (LEXAPRO) tablet 10 mg  10 mg Oral Daily Fritzi Mandes, MD   10 mg at 06/14/17 0843  . ferrous sulfate tablet 325 mg  325 mg Oral Cherylynn Ridges, MD   325 mg at 06/14/17 0842  . latanoprost (XALATAN) 0.005 % ophthalmic solution 1 drop  1 drop Both Eyes QHS Fritzi Mandes, MD   1 drop at 06/13/17 2200  . meclizine  (ANTIVERT) tablet 25 mg  25 mg Oral TID PRN Fritzi Mandes, MD      . multivitamin with minerals tablet 1 tablet  1 tablet Oral Daily Fritzi Mandes, MD   1 tablet at 06/14/17 [phone]  . omega-3 acid ethyl esters (LOVAZA) capsule 1 g  1 capsule Oral Daily Fritzi Mandes, MD   1 g at 06/14/17 [phone]  . ondansetron (ZOFRAN) tablet 4 mg  4 mg Oral Q6H PRN Fritzi Mandes, MD       Or  . ondansetron [facility]) injection 4 mg  4 mg Intravenous Q6H PRN Fritzi Mandes, MD   4 mg at 06/12/17 1851  . pantoprazole (PROTONIX) EC tablet 40 mg  40 mg Oral QAC breakfast Fritzi Mandes, MD   40 mg at 06/14/17 0842  . traMADol (ULTRAM) tablet 50 mg  50 mg Oral Q6H PRN Fritzi Mandes, MD      . vitamin C (ASCORBIC ACID) tablet 250 mg  250 mg Oral Daily Fritzi Mandes, MD   250 mg at 06/14/17 4695     Discharge Medications: Please see discharge summary for a list of discharge medications.  Relevant Imaging Results:  Relevant Lab Results:   Additional Information SSN [id number]  Ross Ludwig, [place]

## 2017-06-14 NOTE — Discharge Summary (Signed)
Kevin [name] at [place] NAME: Kevin [name]    MR#:  [id number]  DATE OF BIRTH:  02-May-1925  DATE OF ADMISSION:  06/12/2017 ADMITTING PHYSICIAN: Fritzi Mandes, MD  DATE OF DISCHARGE: 06/14/2017  PRIMARY CARE PHYSICIAN: Crecencio Mc, MD    ADMISSION DIAGNOSIS:  Elevated INR [R79.1] Right lower quadrant abdominal pain [R10.31] Abdominal pain [R10.9] Acute renal failure, unspecified acute renal failure type ([place]) [N17.9] Non-intractable vomiting with nausea, unspecified vomiting type [R11.2]  DISCHARGE DIAGNOSIS:  Acute gastroenteritis with dehydration-resolved Acute renal failure-improved Supratherapeutic INR improving. INR at discharge is 5.5. Coumadin on hold. Coumadin to be resumed once INR is between 2 and 3. Chronic atrial fibrillation Generalized weakness/deconditioning SE [place] DIAGNOSIS:   Past Medical History:  Diagnosis Date  . Atrial fibrillation ([place])   . Cancer ([place])    melanoma- right ear  . Chronic headaches started age 41  . GERD (gastroesophageal reflux disease)   . Glaucoma   . History of mitral valve replacement with mechanical valve   . Hypertension   . Osteoporosis    secondary to low testoerone  . Sciatica   . Sciatica     HOSPITAL COURSE:   Kevin [name] a 83 y.o. malewith a known history of Atrial fibrillation on Coumadin, GERD, history of mechanical mitral valve replacement comes to the emergency room from belligerent [place] after he started having intermittent episodes of nausea and vomiting.  1. Acute gastroenteritis suspected food poisoning -Received IV fluids -Clear liquid diet---advanced to soft diet, tolerating well -When necessary Zofran  2. Acute renal failure appears prerenal in the setting of gastroenteritis -Received IV fluids -Baseline creatinine of 0.9 -Came in with creatinine of 2.2 ---IV fluids--1.24 -Avoid nephrotoxins  3. Large hiatal hernia with dilated loops of  small bowel/partial small bowel obstruction as noted on CT abdomen -No signs of acute abdomen clinically -Consider surgical consultation if symptoms do not improve -Continue supportive care  4. Supratherapeutic INR -INR of 8.0---9.3---vitamin K 5 mg --- 5.5 -Coumadin still on hold. Patient will get PT/INR drawn daily at rehabilitation and recommend resuming Coumadin once INR is between 2 and 3. Will defer pharmacy at rehabilitation to dose Coumadin. -Daily PT/INR  5. DVT prophylaxis -Supratherapeutic INR  6. History of hypertension -Resumed metoprolol. Discontinue amlodipine. Resume losartan 50 mg from Monday, 06/17/2016  7. Chronic A. Fib -Rate controlled. Now back on beta blockers.  8. Generalized deconditioning. Physical therapy to see patient. Patient will benefit from rehabilitation.  Social worker for discharge planning. Discharge rehabilitation once bed available.  CONSULTS OBTAINED:    DRUG ALLERGIES:   Allergies  Allergen Reactions  . Cymbalta [Duloxetine Hcl] Other (See Comments)    Urinary retention, constipation and insomnia  . Aspirin     Heart operation was told to never take Aspirin    DISCHARGE MEDICATIONS:   Current Discharge Medication List    CONTINUE these medications which have CHANGED   Details  furosemide (LASIX) 20 MG tablet Take 1 tablet (20 mg total) by mouth daily. Qty: 30 tablet, Refills: 2    losartan (COZAAR) 100 MG tablet Take 0.5 tablets (50 mg total) by mouth daily. Qty: 90 tablet, Refills: 1      CONTINUE these medications which have NOT CHANGED   Details  5-Hydroxytryptophan (5-HTP PO) Take by mouth daily.    ALPHA LIPOIC ACID PO Take by mouth daily.    Ascorbic Acid (VITAMIN C) 100 MG tablet Take 100 mg by mouth daily.  brimonidine (ALPHAGAN) 0.15 % ophthalmic solution Place 1 drop into both eyes 2 (two) times daily.     Calcium-Magnesium-Vitamin D (CALCIUM MAGNESIUM PO) Take by mouth 2 (two) times daily.     cholecalciferol (VITAMIN D) 1000 units tablet Take 1,000 Units by mouth daily.    co-enzyme Q-10 30 MG capsule Take 1 capsule (30 mg total) by mouth daily. Qty: 90 capsule, Refills: 3    escitalopram (LEXAPRO) 10 MG tablet Take 1 tablet (10 mg total) by mouth daily. Qty: 30 tablet, Refills: 3    ferrous sulfate 324 (65 FE) MG TBEC Take 1 tablet by mouth every other day.    fluocinonide cream (LIDEX) 1.51 % Apply 1 application topically 2 (two) times daily. Refills: 0    latanoprost (XALATAN) 0.005 % ophthalmic solution Place 1 drop into both eyes at bedtime.     meclizine (ANTIVERT) 25 MG tablet Take 25 mg by mouth 3 (three) times daily as needed for dizziness.    metoprolol tartrate (LOPRESSOR) 25 MG tablet TAKE 1 TABLET BY MOUTH TWICE DAILY Qty: 60 tablet, Refills: 0    Multiple Vitamin (MULTIVITAMIN) tablet Take 1 tablet by mouth daily.      Nutritional Supplements (MELATONIN PO) Take by mouth.    Omega-3 Fatty Acids (OMEGA 3 PO) Take by mouth.      omeprazole (PRILOSEC) 40 MG capsule Take 40 mg by mouth daily.    Oyster Shell Calcium 500 MG TABS Take two by mouth daily     Red Yeast Rice Extract (RED YEAST RICE PO) Take by mouth.      RUTIN PO Take by mouth.    TURMERIC PO Take by mouth.      SYRINGE-NEEDLE, DISP, 3 ML (SAFETY-LOK 3CC SYR 22GX1.5") 22G X 1-1/2" 3 ML MISC 2 ml's monthly-given 1 shot monthly       STOP taking these medications     amLODipine (NORVASC) 5 MG tablet      warfarin (COUMADIN) 3 MG tablet         If you experience worsening of your admission symptoms, develop shortness of breath, life threatening emergency, suicidal or homicidal thoughts you must seek medical attention immediately by calling 911 or calling your MD immediately  if symptoms less severe.  You Must read complete instructions/literature along with all the possible adverse reactions/side effects for all the Medicines you take and that have been prescribed to you. Take any  new Medicines after you have completely understood and accept all the possible adverse reactions/side effects.   Please note  You were cared for by a hospitalist during your hospital stay. If you have any questions about your discharge medications or the care you received while you were in the hospital after you are discharged, you can call the unit and asked to speak with the hospitalist on call if the hospitalist that took care of you is not available. Once you are discharged, your primary care physician will handle any further medical issues. Please note that NO REFILLS for any discharge medications will be authorized once you are discharged, as it is imperative that you return to your primary care physician (or establish a relationship with a primary care physician if you do not have one) for your aftercare needs so that they can reassess your need for medications and monitor your lab values. Today   SUBJECTIVE    feels weak   VITAL SIGNS:  Blood pressure 134/71, pulse 73, temperature 98.1 F (36.7 C), resp. rate 19,  height 5\' 7"  (1.702 m), weight 65.3 kg (144 lb), SpO2 97 %.  I/O:   Intake/Output Summary (Last 24 hours) at 06/14/17 1136 Last data filed at 06/14/17 0930  Gross per 24 hour  Intake              790 ml  Output             1202 ml  Net             -412 ml    PHYSICAL EXAMINATION:  GENERAL:  83 y.o.-year-old patient lying in the bed with no acute distress.  EYES: Pupils equal, round, reactive to light and accommodation. No scleral icterus. Extraocular muscles intact.  HEENT: Head atraumatic, normocephalic. Oropharynx and nasopharynx clear. Bilateral hearing aids  NECK:  Supple, no jugular venous distention. No thyroid enlargement, no tenderness.  LUNGS: Normal breath sounds bilaterally, no wheezing, rales,rhonchi or crepitation. No use of accessory muscles of respiration.  CARDIOVASCULAR: S1, S2 normal. No murmurs, rubs, or gallops.  ABDOMEN: Soft, non-tender,  non-distended. Bowel sounds present. No organomegaly or mass.  EXTREMITIES: No pedal edema, cyanosis, or clubbing.  NEUROLOGIC: Cranial nerves II through XII are intact. Muscle strength 5/5 in all extremities. Sensation intact. Gait not checked.  PSYCHIATRIC: The patient is alert and oriented x 3.  SKIN: No obvious rash, lesion, or ulcer.   DATA REVIEW:   CBC   Recent Labs Lab 06/12/17 1203  WBC 4.9  HGB 12.1*  HCT 34.1*  PLT 221    Chemistries   Recent Labs Lab 06/12/17 1203  06/14/17 0434  NA 129*  < > 132*  K 3.6  < > 3.5  CL 94*  < > 101  CO2 25  < > 25  GLUCOSE 160*  < > 130*  BUN 81*  < > 39*  CREATININE 2.23*  < > 0.84  CALCIUM 8.3*  < > 7.8*  AST 44*  --   --   ALT 45  --   --   ALKPHOS 44  --   --   BILITOT 0.9  --   --   < > = values in this interval not displayed.  Microbiology Results   No results found for this or any previous visit (from the past 240 hour(s)).  RADIOLOGY:  Ct Abdomen Pelvis Wo Contrast  Result Date: 06/12/2017 CLINICAL DATA:  3-4 days with nausea and vomiting. 2-3 episodes of vomiting each day. EXAM: CT ABDOMEN AND PELVIS WITHOUT CONTRAST TECHNIQUE: Multidetector CT imaging of the abdomen and pelvis was performed following the standard protocol without IV contrast. COMPARISON:  Chest CT 06/04/2012 FINDINGS: Lower chest: Again noted is a calcified granuloma in the lingula. Stable small pleural-based nodular density in the right middle lobe on sequence 3, image 6. No large pleural effusions. There is a prosthetic mitral valve and cardiac lead extending into the right ventricle. Patient now has a massive hiatal hernia which has markedly progressed since 2013. Hepatobiliary: No acute abnormality of the liver or gallbladder on this noncontrast examination. Pancreas: Normal appearance of the pancreas without inflammation or duct dilatation. Spleen: Normal appearance of spleen without enlargement. Adrenals/Urinary Tract: There are at least 3  exophytic cysts involving the left kidney. Largest cyst is in left interpolar region measuring up to 7.5 cm. Fluid in the urinary bladder. Negative for hydronephrosis. Probable cyst in the right kidney interpolar region which is poorly characterized on this noncontrast examination. Stomach/Bowel: Majority of the stomach is now herniating into the chest.  There is oral contrast within the stomach and proximal small bowel. Dilated loops of small bowel in the abdomen measuring up to 4.2 cm. Rectum is distended with gas. Extensive diverticulosis in the sigmoid colon. Distal small bowel is decompressed compared to the dilated loops of small bowel. There is not an obvious transitional point between dilated and nondilated small bowel. Vascular/Lymphatic: Abdominal aorta is heavily calcified without aneurysm. No significant lymph node enlargement in the abdomen or pelvis. Reproductive: Calcifications in the prostate gland. Other: Surgical mesh material in the anterior lower pelvis and lower abdomen. No evidence for free fluid or free air. Musculoskeletal: Levoscoliosis of thoracolumbar spine. Multilevel degenerative disease in lumbar spine. IMPRESSION: Large hiatal hernia with dilated loops of small bowel. Findings are suggestive for at least a partial small bowel obstruction. The bowel gas pattern is well demonstrated on the topogram images and could be followed with abdominal radiographs. Renal cysts. Scoliosis with multilevel degenerative bone disease. Postsurgical changes in the chest. Electronically Signed   By: Markus Daft M.D.   On: 06/12/2017 13:52     Management plans discussed with the patient, family and they are in agreement.  CODE STATUS:     Code Status Orders        Start     Ordered   06/12/17 1532  Do not attempt resuscitation (DNR)  Continuous    Question Answer Comment  In the event of cardiac or respiratory ARREST Do not call a "code blue"   In the event of cardiac or respiratory ARREST Do  not perform Intubation, CPR, defibrillation or ACLS   In the event of cardiac or respiratory ARREST Use medication by any route, position, wound care, and other measures to relive pain and suffering. May use oxygen, suction and manual treatment of airway obstruction as needed for comfort.      06/12/17 1531    Code Status History    Date Active Date Inactive Code Status Order ID Comments User Context   This patient has a current code status but no historical code status.    Advance Directive Documentation     Most Recent Value  Type of Advance Directive  Out of facility DNR (pink MOST or yellow form)  Pre-existing out of facility DNR order (yellow form or pink MOST form)  Yellow form placed in chart (order not valid for inpatient use)  "MOST" Form in Place?  -      TOTAL TIME TAKING CARE OF THIS PATIENT: 40 minutes.    Eilam Shrewsbury M.D on 06/14/2017 at 11:36 AM  Between 7am to 6pm - Pager - [phone] After 6pm go to [email or web address] - password [email or web address] [place] [facility]  Office  [phone]  CC: Primary care physician; Crecencio Mc, MD

## 2017-06-14 NOTE — Care Management (Signed)
Physical therapy has recommended skilled nursing placement.  Patient is in agreement.. CSW aware.

## 2017-06-18 NOTE — Clinical Social Work Note (Addendum)
Clinical Social Work Assessment  Patient Details  Name: Kevin [name] MRN: [id number] Date of Birth: 1925-05-03  Date of referral:  06/13/17               Reason for consult:  Facility Placement                Permission sought to share information with:  Facility Sport and exercise psychologist, Family Supports Permission granted to share information::  Yes, Verbal Permission Granted  Name::     Kevin, [name] [phone]   Agency::  SNF admissions  Relationship::     Contact Information:     Housing/Transportation Living arrangements for the past 2 months:  [place] of Information:  Patient Patient Interpreter Needed:  None Criminal Activity/Legal Involvement Pertinent to Current Situation/Hospitalization:  No - Comment as needed Significant Relationships:  Siblings Lives with:  Facility Resident Do you feel safe going back to the place where you live?  No Need for family participation in patient care:  No (Coment)  Care giving concerns:  Patient feels he needs to go to SNF for short term rehab before he is able to return back to his [facility].   Social Worker assessment / plan:  Patient is a 83 year old male who is alert and oriented x4.  Patient has been living at the [facility] for approximately 11 years, patient states he has been pleased with his living arrangements.  Patient expressed that he has been to SNF in the past, CSW reminded patient about what to expect and what the process is for going to SNF for short term rehab.  Patient was explained how insurance will pay for stay and what the role of CSW is.  Patient did not express any other questions or concerns, and gave CSW permission to begin bed search in [facility].  Employment status:  Retired Nurse, adult PT Recommendations:  [place] / Referral to community resources:  [place]  Patient/Family's Response to care:  Patient is in agreement to going to SNF for short term rehab.  Patient/Family's Understanding of and Emotional Response to Diagnosis, Current Treatment, and Prognosis:  Patient is aware of current treatment and prognosis.  Patient expressed that he is hopeful that he will not have to be at SNF very long.  Emotional Assessment Appearance:  Appears stated age Attitude/Demeanor/Rapport:    Affect (typically observed):  Appropriate, Calm, Pleasant Orientation:  Oriented to Self, Oriented to Place, Oriented to  Time, Oriented to Situation Alcohol / Substance use:  Not Applicable Psych involvement (Current and /or in the community):  No (Comment)  Discharge Needs  Concerns to be addressed:  Lack of Support Readmission within the last 30 days:  No Current discharge risk:  Lack of support system Barriers to Discharge:  No Barriers Identified   Ross Ludwig, LCSWA 06/13/17 2:15pm

## 2017-06-18 NOTE — Clinical Social Work Placement (Addendum)
   CLINICAL SOCIAL WORK PLACEMENT  NOTE  Date:  06/14/17  Patient Details  Name: Kevin [name] MRN: [id number] Date of Birth: 03-15-1925  Clinical Social Work is seeking post-discharge placement for this patient at the [place] level of care (*CSW will initial, date and re-position this form in  chart as items are completed):  Yes   Patient/family provided with [facility] Work Department's list of facilities offering this level of care within the geographic area requested by the patient (or if unable, by the patient's family).  Yes   Patient/family informed of their freedom to choose among providers that offer the needed level of care, that participate in Medicare, Medicaid or managed care program needed by the patient, have an available bed and are willing to accept the patient.  Yes   Patient/family informed of [facility]'s ownership interest in [facility] and [facility], as well as of the fact that they are under no obligation to receive care at these facilities.  PASRR submitted to EDS on 06/13/17     PASRR number received on       Existing PASRR number confirmed on 06/13/17     FL2 transmitted to all facilities in geographic area requested by pt/family on 06/13/17     FL2 transmitted to all facilities within larger geographic area on       Patient informed that his/her managed care company has contracts with or will negotiate with certain facilities, including the following:         06-14-17   Patient/family informed of bed offers received.  Patient chooses bed at [facility]     Physician recommends and patient chooses bed at      Patient to be transferred to [facility] on 06/14/17.  Patient to be transferred to facility by [facility]     Patient family notified on 06/14/17 of transfer.  Name of family member notified:  Patient's brother Percell Miller     PHYSICIAN Please sign FL2     Additional Comment:     _______________________________________________ Ross Ludwig, LCSWA 06/14/17

## 2017-06-19 ENCOUNTER — Other Ambulatory Visit
Admission: RE | Admit: 2017-06-19 | Discharge: 2017-06-19 | Disposition: A | Discharge status: Home / Self Care | Payer: No Typology Code available for payment source | Source: Ambulatory Visit | Attending: Gerontology | Admitting: Gerontology

## 2017-06-19 DIAGNOSIS — I482 Chronic atrial fibrillation: Secondary | ICD-10-CM | POA: Insufficient documentation

## 2017-06-19 LAB — COMPREHENSIVE METABOLIC PANEL
ALT: 59 U/L (ref 17–63)
AST: 52 U/L — ABNORMAL HIGH (ref 15–41)
Albumin: 2.6 g/dL — ABNORMAL LOW (ref 3.5–5.0)
Alkaline Phosphatase: 59 U/L (ref 38–126)
Anion gap: 7 (ref 5–15)
BILIRUBIN TOTAL: 0.6 mg/dL (ref 0.3–1.2)
BUN: 7 mg/dL (ref 6–20)
CHLORIDE: 102 mmol/L (ref 101–111)
CO2: 29 mmol/L (ref 22–32)
CREATININE: 0.74 mg/dL (ref 0.61–1.24)
Calcium: 8.4 mg/dL — ABNORMAL LOW (ref 8.9–10.3)
Glucose, Bld: 103 mg/dL — ABNORMAL HIGH (ref 65–99)
POTASSIUM: 4 mmol/L (ref 3.5–5.1)
Sodium: 138 mmol/L (ref 135–145)
TOTAL PROTEIN: 5.1 g/dL — AB (ref 6.5–8.1)

## 2017-06-19 LAB — CBC WITH DIFFERENTIAL/PLATELET
BASOS PCT: 0 %
Band Neutrophils: 3 %
Basophils Absolute: 0 10*3/uL (ref 0–0.1)
Blasts: 0 %
EOS PCT: 2 %
Eosinophils Absolute: 0.2 10*3/uL (ref 0–0.7)
HCT: 30.6 % — ABNORMAL LOW (ref 40.0–52.0)
Hemoglobin: 10.6 g/dL — ABNORMAL LOW (ref 13.0–18.0)
LYMPHS ABS: 1.6 10*3/uL (ref 1.0–3.6)
LYMPHS PCT: 18 %
MCH: 33.4 pg (ref 26.0–34.0)
MCHC: 34.5 g/dL (ref 32.0–36.0)
MCV: 96.9 fL (ref 80.0–100.0)
MONO ABS: 0.4 10*3/uL (ref 0.2–1.0)
MONOS PCT: 5 %
Metamyelocytes Relative: 0 %
Myelocytes: 0 %
NEUTROS ABS: 6.6 10*3/uL — AB (ref 1.4–6.5)
NEUTROS PCT: 72 %
NRBC: 0 /100{WBCs}
OTHER: 0 %
Platelets: 251 10*3/uL (ref 150–440)
Promyelocytes Absolute: 0 %
RBC: 3.16 MIL/uL — AB (ref 4.40–5.90)
RDW: 13.4 % (ref 11.5–14.5)
WBC: 8.8 10*3/uL (ref 3.8–10.6)

## 2017-06-19 LAB — PROTIME-INR
INR: 1.56
PROTHROMBIN TIME: 18.8 s — AB (ref 11.4–15.2)

## 2017-06-21 ENCOUNTER — Non-Acute Institutional Stay (SKILLED_NURSING_FACILITY): Payer: Medicare Other | Admitting: Gerontology

## 2017-06-21 DIAGNOSIS — M5431 Sciatica, right side: Secondary | ICD-10-CM | POA: Diagnosis not present

## 2017-06-21 DIAGNOSIS — M6281 Muscle weakness (generalized): Secondary | ICD-10-CM

## 2017-06-24 ENCOUNTER — Encounter: Payer: Self-pay | Admitting: Gerontology

## 2017-06-24 NOTE — Progress Notes (Signed)
Location:   [facility] Room Number: 272Z Place of Service:  SNF [phone]) Provider:  Toni Arthurs, NP-C  Crecencio Mc, MD  Patient Care Team: Crecencio Mc, MD as PCP - General (Internal Medicine)  Extended Emergency Contact Information Primary Emergency Contact: Weimann,Willard G Address: [address]          [place], [place] [address] [place] Phone: [phone] Relation: Brother  Code Status:  DNR Goals of care: Advanced Directive information Advanced Directives 06/21/2017  Does Patient Have a Medical Advance Directive? Yes  Type of Advance Directive Out of facility DNR (pink MOST or yellow form)  Does patient want to make changes to medical advance directive? No - Patient declined  Pre-existing out of facility DNR order (yellow form or pink MOST form) -     Chief Complaint  Patient presents with  . Acute Visit    Follow up on back pain    HPI:  Pt is a 83 y.o. male seen today for an acute visit for back pain. Pt reports he has chronic low back pain. Pt reports he was walking down an incline, holding the hand-rail. He lost his footing but did not fall as he was holding the rail. However, the jolt irritated his sciatica. Pt denies feeling a pop or any other abnormal feeling at the time. Pt has been having generalized weakness and is at the facility for PT/OT related to deconditioning. Pt has been working with therapies but is having severe back pain. He feels the pain is inhibiting his therapy. Pt denies n/v/d/f/c/cp/sob/ha/abd pain/dizziness/cough. Appetite is good. Having regular BMs. VSS. No other complaints.     Past Medical History:  Diagnosis Date  . Atrial fibrillation ([place])   . Cancer ([place])    melanoma- right ear  . Cardiomyopathy, secondary ([place])   . Chronic headaches started age 45  . Congestive heart failure (CHF) ([place])   . Coronary atherosclerosis of native coronary artery   . DJD (degenerative joint disease)   .  Essential hypertension, benign   . GERD (gastroesophageal reflux disease)   . Glaucoma   . History of mitral valve replacement with mechanical valve   . Hyperlipidemia, unspecified   . Hypertension   . Lumbago   . Osteoporosis    secondary to low testoerone  . S/P MVR (mitral valve replacement)   . Sciatica   . Scoliosis    Past Surgical History:  Procedure Laterality Date  . CARDIOVERSION  2013  . CATARACT EXTRACTION, BILATERAL     x 2  . CORONARY ARTERY BYPASS GRAFT  12/2014   coronary artery bypass w/arterial grafts   . HERNIA REPAIR     x 2  . MELANOMA EXCISION     right ear  . MITRAL VALVE REPLACEMENT  1994   [facility]  . NASAL SEPTUM SURGERY  1963  . PERMANENT PACEMAKER INSERTION Left 12/22/2014    Allergies  Allergen Reactions  . Cymbalta [Duloxetine Hcl] Other (See Comments)    Urinary retention, constipation and insomnia  . Aspirin     Heart operation was told to never take Aspirin    Allergies as of 06/21/2017      Reactions   Cymbalta [duloxetine Hcl] Other (See Comments)   Urinary retention, constipation and insomnia   Aspirin    Heart operation was told to never take Aspirin      Medication List       Accurate as of  06/21/17 11:59 PM. Always use your most recent med list.          5-Hydroxytryptophan 50 MG Caps Take 1 capsule by mouth daily.   acetaminophen 325 MG tablet Commonly known as:  TYLENOL Take 650 mg by mouth every 4 (four) hours as needed for fever. Maximum dose for 24 hours is 3000 mg from all sources of Acetaminophen/tylenol   Alpha-Lipoic Acid 50 MG Caps Take 1 capsule by mouth daily.   ASPERCREME LIDOCAINE 4 % Ptch Generic drug:  Lidocaine Apply 1 patch topically daily. Apply to base of back/upper right buttock for sciatic pain. Remove patch after 12 hours.   brimonidine 0.15 % ophthalmic solution Commonly known as:  ALPHAGAN Place 1 drop into both eyes 2 (two) times daily.   Calcium-Magnesium-Vitamin D 400-166.7-133.3  MG-MG-UNIT Tabs Take 1 tablet by mouth 2 (two) times daily.   cholecalciferol 1000 units tablet Commonly known as:  VITAMIN D Take 1,000 Units by mouth daily.   co-enzyme Q-10 30 MG capsule Take 1 capsule (30 mg total) by mouth daily.   escitalopram 10 MG tablet Commonly known as:  LEXAPRO Take 1 tablet (10 mg total) by mouth daily.   ferrous sulfate 324 (65 Fe) MG Tbec Take 1 tablet by mouth every other day.   fluocinonide cream 0.05 % Commonly known as:  LIDEX Apply 1 application topically 2 (two) times daily.   furosemide 20 MG tablet Commonly known as:  LASIX Take 1 tablet (20 mg total) by mouth daily.   latanoprost 0.005 % ophthalmic solution Commonly known as:  XALATAN Place 1 drop into both eyes at bedtime.   losartan 50 MG tablet Commonly known as:  COZAAR Take 50 mg by mouth daily.   meclizine 25 MG tablet Commonly known as:  ANTIVERT Take 25 mg by mouth 3 (three) times daily as needed for dizziness.   Melatonin 5 MG Tabs Take 1 tablet by mouth at bedtime.   metoprolol tartrate 25 MG tablet Commonly known as:  LOPRESSOR TAKE 1 TABLET BY MOUTH TWICE DAILY   OMEGA 3 PO Take 1 capsule by mouth daily. 300 - 500 mg   omeprazole 40 MG capsule Commonly known as:  PRILOSEC Take 40 mg by mouth daily at 6 (six) AM.   Red Yeast Rice 600 MG Caps Take 1 capsule by mouth daily.   RUTIN PO Take 500 mg by mouth daily.   traMADol 50 MG tablet Commonly known as:  ULTRAM Take 50 mg by mouth every 4 (four) hours as needed.   Turmeric 500 MG Caps Take 1 capsule by mouth daily.   vitamin C 100 MG tablet Take 100 mg by mouth daily.       Review of Systems  Constitutional: Negative for activity change, appetite change, chills, diaphoresis and fever.  HENT: Negative for congestion, sneezing, sore throat, trouble swallowing and voice change.   Respiratory: Negative for apnea, cough, choking, chest tightness, shortness of breath and wheezing.   Cardiovascular:  Negative for chest pain, palpitations and leg swelling.  Gastrointestinal: Negative for abdominal distention, abdominal pain, constipation, diarrhea and nausea.  Genitourinary: Negative for difficulty urinating, dysuria, frequency and urgency.  Musculoskeletal: Positive for arthralgias (typical arthritis), back pain, gait problem and myalgias.  Skin: Negative for color change, pallor, rash and wound.  Neurological: Positive for weakness. Negative for dizziness, tremors, syncope, speech difficulty, numbness and headaches.  Psychiatric/Behavioral: Negative for agitation and behavioral problems.  All other systems reviewed and are negative.   Immunization History  Administered Date(s) Administered  . Influenza Split 09/17/2013, 09/23/2014  . Influenza-Unspecified 09/09/2016  . Pneumococcal Polysaccharide-23 06/25/2004  . Tdap 10/14/2014  . Zoster 06/26/2011   Pertinent  Health Maintenance Due  Topic Date Due  . PNA vac Low Risk Adult (2 of 2 - PCV13) 06/25/2005  . INFLUENZA VACCINE  07/10/2017   Fall Risk  01/21/2017 01/22/2015 02/15/2014  Falls in the past year? No No No   Functional Status Survey:    Vitals:   06/21/17 1137  BP: 124/66  Pulse: 74  Resp: 18  Temp: 98.6 F (37 C)  SpO2: 97%  Weight: 133 lb 6.4 oz (60.5 kg)  Height: 5\' 7"  (1.702 m)   Body mass index is 20.89 kg/m. Physical Exam  Constitutional: He is oriented to person, place, and time. Vital signs are normal. He appears well-developed and well-nourished. He is active and cooperative. He does not appear ill. No distress.  HENT:  Head: Normocephalic and atraumatic.  Mouth/Throat: Uvula is midline, oropharynx is clear and moist and mucous membranes are normal. Mucous membranes are not pale, not dry and not cyanotic.  Eyes: Pupils are equal, round, and reactive to light. Conjunctivae, EOM and lids are normal.  Neck: Trachea normal, normal range of motion and full passive range of motion without pain. Neck supple.  No JVD present. No tracheal deviation, no edema and no erythema present. No thyromegaly present.  Cardiovascular: Normal rate, regular rhythm, normal heart sounds, intact distal pulses and normal pulses.  Exam reveals no gallop, no distant heart sounds and no friction rub.   No murmur heard. Pulmonary/Chest: Effort normal and breath sounds normal. No accessory muscle usage. No respiratory distress. He has no wheezes. He has no rales. He exhibits no tenderness.  Abdominal: Normal appearance and bowel sounds are normal. He exhibits no distension and no ascites. There is no tenderness.  Musculoskeletal: Normal range of motion. He exhibits no edema.       Arms:      Right upper leg: He exhibits tenderness.       Legs: Expected osteoarthritis, stiffness. Right leg sciatica. Calves soft, supple, Negative Homan's sign  Neurological: He is alert and oriented to person, place, and time. He has normal strength.  Skin: Skin is warm, dry and intact. No rash noted. He is not diaphoretic. No cyanosis or erythema. No pallor. Nails show no clubbing.  Psychiatric: He has a normal mood and affect. His speech is normal and behavior is normal. Judgment and thought content normal. Cognition and memory are normal.  Nursing note and vitals reviewed.   Labs reviewed:  Recent Labs  06/13/17 0459 06/14/17 0434 06/19/17 0500  NA 130* 132* 138  K 3.1* 3.5 4.0  CL 98* 101 102  CO2 27 25 29   GLUCOSE 142* 130* 103*  BUN 64* 39* 7  CREATININE 1.24 0.84 0.74  CALCIUM 7.8* 7.8* 8.4*    Recent Labs  01/21/17 1030 06/12/17 1203 06/19/17 0500  AST 31 44* 52*  ALT 20 45 59  ALKPHOS 56 44 59  BILITOT 0.7 0.9 0.6  PROT 6.6 6.0* 5.1*  ALBUMIN 4.4 3.4* 2.6*    Recent Labs  01/21/17 1030 06/12/17 1203 06/19/17 0500  WBC 4.6 4.9 8.8  NEUTROABS 2.8 3.3 6.6*  HGB 11.7* 12.1* 10.6*  HCT 34.2* 34.1* 30.6*  MCV 98.1 98.5 96.9  PLT 212.0 221 251   Lab Results  Component Value Date   TSH 1.56 01/21/2017    No results found for: HGBA1C  Lab Results  Component Value Date   CHOL 196 01/19/2016   HDL 59 01/19/2016   LDLCALC 124 01/19/2016   LDLDIRECT 105.9 03/13/2012   TRIG 67 01/19/2016    Significant Diagnostic Results in last 30 days:  Ct Abdomen Pelvis Wo Contrast  Result Date: 06/12/2017 CLINICAL DATA:  3-4 days with nausea and vomiting. 2-3 episodes of vomiting each day. EXAM: CT ABDOMEN AND PELVIS WITHOUT CONTRAST TECHNIQUE: Multidetector CT imaging of the abdomen and pelvis was performed following the standard protocol without IV contrast. COMPARISON:  Chest CT 06/04/2012 FINDINGS: Lower chest: Again noted is a calcified granuloma in the lingula. Stable small pleural-based nodular density in the right middle lobe on sequence 3, image 6. No large pleural effusions. There is a prosthetic mitral valve and cardiac lead extending into the right ventricle. Patient now has a massive hiatal hernia which has markedly progressed since 2013. Hepatobiliary: No acute abnormality of the liver or gallbladder on this noncontrast examination. Pancreas: Normal appearance of the pancreas without inflammation or duct dilatation. Spleen: Normal appearance of spleen without enlargement. Adrenals/Urinary Tract: There are at least 3 exophytic cysts involving the left kidney. Largest cyst is in left interpolar region measuring up to 7.5 cm. Fluid in the urinary bladder. Negative for hydronephrosis. Probable cyst in the right kidney interpolar region which is poorly characterized on this noncontrast examination. Stomach/Bowel: Majority of the stomach is now herniating into the chest. There is oral contrast within the stomach and proximal small bowel. Dilated loops of small bowel in the abdomen measuring up to 4.2 cm. Rectum is distended with gas. Extensive diverticulosis in the sigmoid colon. Distal small bowel is decompressed compared to the dilated loops of small bowel. There is not an obvious transitional point between  dilated and nondilated small bowel. Vascular/Lymphatic: Abdominal aorta is heavily calcified without aneurysm. No significant lymph node enlargement in the abdomen or pelvis. Reproductive: Calcifications in the prostate gland. Other: Surgical mesh material in the anterior lower pelvis and lower abdomen. No evidence for free fluid or free air. Musculoskeletal: Levoscoliosis of thoracolumbar spine. Multilevel degenerative disease in lumbar spine. IMPRESSION: Large hiatal hernia with dilated loops of small bowel. Findings are suggestive for at least a partial small bowel obstruction. The bowel gas pattern is well demonstrated on the topogram images and could be followed with abdominal radiographs. Renal cysts. Scoliosis with multilevel degenerative bone disease. Postsurgical changes in the chest. Electronically Signed   By: Markus Daft M.D.   On: 06/12/2017 13:52    Assessment/Plan 1. Sciatica of right side  Aspercreme 4% Lidocaine patch- apply 1 patch Q Day to lower back, right upper buttock- Apply after 12 hours  Tramadol 50 mg po Q 4 hours prn  2. Generalized Muscle Weakness  Continue PT/OT  Safety precautions  Family/ staff Communication:   Total Time:  Documentation:  Face to Face:  Family/Phone:   Labs/tests ordered:    Medication list reviewed and assessed for continued appropriateness.  Vikki Ports, NP-C Geriatrics [facility] [phone], [place] [address] Cell Phone (Mon-Fri 8am-5pm):  [phone] On Call:  [phone] & follow prompts after 5pm & weekends Office Phone:  [phone] Office Fax:  [phone]

## 2017-06-27 ENCOUNTER — Encounter: Payer: Self-pay | Admitting: Gerontology

## 2017-06-27 ENCOUNTER — Other Ambulatory Visit
Admission: RE | Admit: 2017-06-27 | Discharge: 2017-06-27 | Disposition: A | Discharge status: Home / Self Care | Payer: Medicare Other | Source: Ambulatory Visit | Attending: Gerontology | Admitting: Gerontology

## 2017-06-27 ENCOUNTER — Non-Acute Institutional Stay (SKILLED_NURSING_FACILITY): Payer: Medicare Other | Admitting: Gerontology

## 2017-06-27 DIAGNOSIS — M6281 Muscle weakness (generalized): Secondary | ICD-10-CM

## 2017-06-27 DIAGNOSIS — I482 Chronic atrial fibrillation: Secondary | ICD-10-CM | POA: Insufficient documentation

## 2017-06-27 DIAGNOSIS — Z5181 Encounter for therapeutic drug level monitoring: Secondary | ICD-10-CM | POA: Diagnosis not present

## 2017-06-27 DIAGNOSIS — M5431 Sciatica, right side: Secondary | ICD-10-CM | POA: Diagnosis not present

## 2017-06-27 LAB — CBC WITH DIFFERENTIAL/PLATELET
BASOS ABS: 0 10*3/uL (ref 0–0.1)
BASOS PCT: 1 %
EOS ABS: 0.1 10*3/uL (ref 0–0.7)
Eosinophils Relative: 3 %
HCT: 24.9 % — ABNORMAL LOW (ref 40.0–52.0)
HEMOGLOBIN: 8.7 g/dL — AB (ref 13.0–18.0)
Lymphocytes Relative: 21 %
Lymphs Abs: 0.9 10*3/uL — ABNORMAL LOW (ref 1.0–3.6)
MCH: 34.3 pg — ABNORMAL HIGH (ref 26.0–34.0)
MCHC: 34.9 g/dL (ref 32.0–36.0)
MCV: 98.3 fL (ref 80.0–100.0)
MONO ABS: 0.5 10*3/uL (ref 0.2–1.0)
MONOS PCT: 11 %
NEUTROS PCT: 64 %
Neutro Abs: 2.8 10*3/uL (ref 1.4–6.5)
Platelets: 239 10*3/uL (ref 150–440)
RBC: 2.53 MIL/uL — ABNORMAL LOW (ref 4.40–5.90)
RDW: 14 % (ref 11.5–14.5)
WBC: 4.3 10*3/uL (ref 3.8–10.6)

## 2017-06-27 LAB — COMPREHENSIVE METABOLIC PANEL
ALBUMIN: 2.8 g/dL — AB (ref 3.5–5.0)
ALT: 21 U/L (ref 17–63)
ANION GAP: 6 (ref 5–15)
AST: 22 U/L (ref 15–41)
Alkaline Phosphatase: 74 U/L (ref 38–126)
BUN: 10 mg/dL (ref 6–20)
CO2: 29 mmol/L (ref 22–32)
Calcium: 8.8 mg/dL — ABNORMAL LOW (ref 8.9–10.3)
Chloride: 102 mmol/L (ref 101–111)
Creatinine, Ser: 0.86 mg/dL (ref 0.61–1.24)
GFR calc non Af Amer: >60 mL/min (ref >60)
GLUCOSE: 90 mg/dL (ref 65–99)
POTASSIUM: 3.9 mmol/L (ref 3.5–5.1)
Sodium: 137 mmol/L (ref 135–145)
Total Bilirubin: 0.7 mg/dL (ref 0.3–1.2)
Total Protein: 5.4 g/dL — ABNORMAL LOW (ref 6.5–8.1)

## 2017-06-27 LAB — PROTIME-INR
INR: 1.08
Prothrombin Time: 14 seconds (ref 11.4–15.2)

## 2017-06-27 NOTE — Progress Notes (Signed)
Location:   [facility] Room Number: 166A Place of Service:  SNF [phone]) Provider:  Toni Arthurs, NP-C  Crecencio Mc, MD  Patient Care Team: Crecencio Mc, MD as PCP - General (Internal Medicine)  Extended Emergency Contact Information Primary Emergency Contact: Nikkel,Willard G Address: [address]          [place], [place] [address] [place] Phone: [phone] Relation: Brother  Code Status:  DNR Goals of care: Advanced Directive information Advanced Directives 06/27/2017  Does Patient Have a Medical Advance Directive? Yes  Type of Advance Directive Out of facility DNR (pink MOST or yellow form)  Does patient want to make changes to medical advance directive? No - Patient declined  Pre-existing out of facility DNR order (yellow form or pink MOST form) -     Chief Complaint  Patient presents with  . Medical Management of Chronic Issues    Routine Visit    HPI:  Pt is a 83 y.o. male seen today for follow up for back pain. Pt reports he has chronic low back pain. Pt reports he was walking down an incline, holding the hand-rail. He lost his footing but did not fall as he was holding the rail. However, the jolt irritated his sciatica. Pt denies feeling a pop or any other abnormal feeling at the time. Pt has been having generalized weakness and is at the facility for PT/OT related to deconditioning. Pt has been working with therapies but is having severe back pain. He feels the pain is inhibiting his therapy. Medication regimen adjusted last visit. Will re-address medication regimen. Pt also seen today for management of Coumadin (Warfarin) for VTE Prophylaxis in the setting of afib with artificial heart valve. INR 1.08. Pt has been complaint with medication regimen and is aware of dietary modifications needed. Pt is aware of importance of continued compliance with medication and testing. Pt has not displayed any adverse effects related to  anticoagulant therapy such as unexplained or excessive bleeding, bruising, hematuria, hematemesis, melena. Heart rate is controlled. Pt denies n/v/d/f/c/cp/sob/ha/abd pain/dizziness/cough. Appetite is good. Having regular BMs. VSS. No other complaints.   Past Medical History:  Diagnosis Date  . Atrial fibrillation ([place])   . Cancer ([place])    melanoma- right ear  . Cardiomyopathy, secondary ([place])   . Chronic headaches started age 57  . Congestive heart failure (CHF) ([place])   . Coronary atherosclerosis of native coronary artery   . DJD (degenerative joint disease)   . Essential hypertension, benign   . GERD (gastroesophageal reflux disease)   . Glaucoma   . History of mitral valve replacement with mechanical valve   . Hyperlipidemia, unspecified   . Hypertension   . Lumbago   . Osteoporosis    secondary to low testoerone  . S/P MVR (mitral valve replacement)   . Sciatica   . Scoliosis    Past Surgical History:  Procedure Laterality Date  . CARDIOVERSION  2013  . CATARACT EXTRACTION, BILATERAL     x 2  . CORONARY ARTERY BYPASS GRAFT  12/2014   coronary artery bypass w/arterial grafts   . HERNIA REPAIR     x 2  . MELANOMA EXCISION     right ear  . MITRAL VALVE REPLACEMENT  1994   [facility]  . NASAL SEPTUM SURGERY  1963  . PERMANENT PACEMAKER INSERTION Left 12/22/2014    Allergies  Allergen Reactions  . Cymbalta [Duloxetine Hcl] Other (See Comments)  Urinary retention, constipation and insomnia  . Aspirin     Heart operation was told to never take Aspirin    Allergies as of 06/27/2017      Reactions   Cymbalta [duloxetine Hcl] Other (See Comments)   Urinary retention, constipation and insomnia   Aspirin    Heart operation was told to never take Aspirin      Medication List       Accurate as of 06/27/17  2:37 PM. Always use your most recent med list.          acetaminophen 325 MG tablet Commonly known as:  TYLENOL Take 650 mg by mouth every 4 (four) hours as  needed for fever. Maximum dose for 24 hours is 3000 mg from all sources of Acetaminophen/tylenol   Alpha-Lipoic Acid 50 MG Caps Take 1 capsule by mouth daily.   ASPERCREME LIDOCAINE 4 % Ptch Generic drug:  Lidocaine Apply 1 patch topically daily. Apply to base of back/upper right buttock for sciatic pain. Remove patch after 12 hours.   brimonidine 0.15 % ophthalmic solution Commonly known as:  ALPHAGAN Place 1 drop into both eyes 2 (two) times daily.   Calcium-Magnesium-Vitamin D 400-166.7-133.3 MG-MG-UNIT Tabs Take 1 tablet by mouth 2 (two) times daily.   cholecalciferol 1000 units tablet Commonly known as:  VITAMIN D Take 1,000 Units by mouth daily.   co-enzyme Q-10 30 MG capsule Take 1 capsule (30 mg total) by mouth daily.   escitalopram 10 MG tablet Commonly known as:  LEXAPRO Take 1 tablet (10 mg total) by mouth daily.   ferrous sulfate 324 (65 Fe) MG Tbec Take 1 tablet by mouth every other day.   fluocinonide cream 0.05 % Commonly known as:  LIDEX Apply 1 application topically 2 (two) times daily.   furosemide 20 MG tablet Commonly known as:  LASIX Take 1 tablet (20 mg total) by mouth daily.   latanoprost 0.005 % ophthalmic solution Commonly known as:  XALATAN Place 1 drop into both eyes at bedtime.   losartan 50 MG tablet Commonly known as:  COZAAR Take 50 mg by mouth daily.   meclizine 25 MG tablet Commonly known as:  ANTIVERT Take 25 mg by mouth 3 (three) times daily as needed for dizziness.   Melatonin 5 MG Tabs Take 1 tablet by mouth at bedtime.   metoprolol tartrate 25 MG tablet Commonly known as:  LOPRESSOR TAKE 1 TABLET BY MOUTH TWICE DAILY   omeprazole 40 MG capsule Commonly known as:  PRILOSEC Take 40 mg by mouth daily at 6 (six) AM.   Red Yeast Rice 600 MG Caps Take 1 capsule by mouth daily.   RUTIN PO Take 500 mg by mouth daily.   traMADol 50 MG tablet Commonly known as:  ULTRAM Take 50 mg by mouth every 4 (four) hours as  needed.   Turmeric 500 MG Caps Take 1 capsule by mouth daily.   vitamin C 100 MG tablet Take 100 mg by mouth daily.   warfarin 2.5 MG tablet Commonly known as:  COUMADIN Take 2.5 mg by mouth daily. At 5 pm       Review of Systems  Constitutional: Negative for activity change, appetite change, chills, diaphoresis and fever.  HENT: Negative for congestion, sneezing, sore throat, trouble swallowing and voice change.   Respiratory: Negative for apnea, cough, choking, chest tightness, shortness of breath and wheezing.   Cardiovascular: Negative for chest pain, palpitations and leg swelling.  Gastrointestinal: Negative for abdominal distention, abdominal pain,  constipation, diarrhea and nausea.  Genitourinary: Negative for difficulty urinating, dysuria, frequency and urgency.  Musculoskeletal: Positive for arthralgias (typical arthritis), back pain, gait problem and myalgias.  Skin: Negative for color change, pallor, rash and wound.  Neurological: Positive for weakness. Negative for dizziness, tremors, syncope, speech difficulty, numbness and headaches.  Psychiatric/Behavioral: Negative for agitation and behavioral problems.  All other systems reviewed and are negative.   Immunization History  Administered Date(s) Administered  . Influenza Split 09/17/2013, 09/23/2014  . Influenza-Unspecified 09/09/2016  . Pneumococcal Polysaccharide-23 06/25/2004  . Tdap 10/14/2014  . Zoster 06/26/2011   Pertinent  Health Maintenance Due  Topic Date Due  . PNA vac Low Risk Adult (2 of 2 - PCV13) 06/25/2005  . INFLUENZA VACCINE  07/10/2017   Fall Risk  01/21/2017 01/22/2015 02/15/2014  Falls in the past year? No No No   Functional Status Survey:    Vitals:   06/27/17 1420  BP: 124/66  Pulse: 74  Resp: 18  Temp: 98.6 F (37 C)  SpO2: 97%  Weight: 142 lb 3.2 oz (64.5 kg)  Height: 5\' 7"  (1.702 m)   Body mass index is 22.27 kg/m. Physical Exam  Constitutional: He is oriented to person,  place, and time. Vital signs are normal. He appears well-developed and well-nourished. He is active and cooperative. He does not appear ill. No distress.  HENT:  Head: Normocephalic and atraumatic.  Mouth/Throat: Uvula is midline, oropharynx is clear and moist and mucous membranes are normal. Mucous membranes are not pale, not dry and not cyanotic.  Eyes: Pupils are equal, round, and reactive to light. Conjunctivae, EOM and lids are normal.  Neck: Trachea normal, normal range of motion and full passive range of motion without pain. Neck supple. No JVD present. No tracheal deviation, no edema and no erythema present. No thyromegaly present.  Cardiovascular: Normal rate, regular rhythm, normal heart sounds, intact distal pulses and normal pulses.  Exam reveals no gallop, no distant heart sounds and no friction rub.   No murmur heard. Pulmonary/Chest: Effort normal and breath sounds normal. No accessory muscle usage. No respiratory distress. He has no wheezes. He has no rales. He exhibits no tenderness.  Abdominal: Normal appearance and bowel sounds are normal. He exhibits no distension and no ascites. There is no tenderness.  Musculoskeletal: Normal range of motion. He exhibits no edema.       Arms:      Right upper leg: He exhibits tenderness.       Legs: Expected osteoarthritis, stiffness. Right leg sciatica. Calves soft, supple, Negative Homan's sign  Neurological: He is alert and oriented to person, place, and time. He has normal strength.  Skin: Skin is warm, dry and intact. No rash noted. He is not diaphoretic. No cyanosis or erythema. No pallor. Nails show no clubbing.  Psychiatric: He has a normal mood and affect. His speech is normal and behavior is normal. Judgment and thought content normal. Cognition and memory are normal.  Nursing note and vitals reviewed.   Labs reviewed:  Recent Labs  06/14/17 0434 06/19/17 0500 06/27/17 0530  NA 132* 138 137  K 3.5 4.0 3.9  CL 101 102 102   CO2 25 29 29   GLUCOSE 130* 103* 90  BUN 39* 7 10  CREATININE 0.84 0.74 0.86  CALCIUM 7.8* 8.4* 8.8*    Recent Labs  06/12/17 1203 06/19/17 0500 06/27/17 0530  AST 44* 52* 22  ALT 45 59 21  ALKPHOS 44 59 74  BILITOT 0.9 0.6  0.7  PROT 6.0* 5.1* 5.4*  ALBUMIN 3.4* 2.6* 2.8*    Recent Labs  06/12/17 1203 06/19/17 0500 06/27/17 0530  WBC 4.9 8.8 4.3  NEUTROABS 3.3 6.6* 2.8  HGB 12.1* 10.6* 8.7*  HCT 34.1* 30.6* 24.9*  MCV 98.5 96.9 98.3  PLT 221 251 239   Lab Results  Component Value Date   TSH 1.56 01/21/2017   No results found for: HGBA1C Lab Results  Component Value Date   CHOL 196 01/19/2016   HDL 59 01/19/2016   LDLCALC 124 01/19/2016   LDLDIRECT 105.9 03/13/2012   TRIG 67 01/19/2016    Significant Diagnostic Results in last 30 days:  Ct Abdomen Pelvis Wo Contrast  Result Date: 06/12/2017 CLINICAL DATA:  3-4 days with nausea and vomiting. 2-3 episodes of vomiting each day. EXAM: CT ABDOMEN AND PELVIS WITHOUT CONTRAST TECHNIQUE: Multidetector CT imaging of the abdomen and pelvis was performed following the standard protocol without IV contrast. COMPARISON:  Chest CT 06/04/2012 FINDINGS: Lower chest: Again noted is a calcified granuloma in the lingula. Stable small pleural-based nodular density in the right middle lobe on sequence 3, image 6. No large pleural effusions. There is a prosthetic mitral valve and cardiac lead extending into the right ventricle. Patient now has a massive hiatal hernia which has markedly progressed since 2013. Hepatobiliary: No acute abnormality of the liver or gallbladder on this noncontrast examination. Pancreas: Normal appearance of the pancreas without inflammation or duct dilatation. Spleen: Normal appearance of spleen without enlargement. Adrenals/Urinary Tract: There are at least 3 exophytic cysts involving the left kidney. Largest cyst is in left interpolar region measuring up to 7.5 cm. Fluid in the urinary bladder. Negative for  hydronephrosis. Probable cyst in the right kidney interpolar region which is poorly characterized on this noncontrast examination. Stomach/Bowel: Majority of the stomach is now herniating into the chest. There is oral contrast within the stomach and proximal small bowel. Dilated loops of small bowel in the abdomen measuring up to 4.2 cm. Rectum is distended with gas. Extensive diverticulosis in the sigmoid colon. Distal small bowel is decompressed compared to the dilated loops of small bowel. There is not an obvious transitional point between dilated and nondilated small bowel. Vascular/Lymphatic: Abdominal aorta is heavily calcified without aneurysm. No significant lymph node enlargement in the abdomen or pelvis. Reproductive: Calcifications in the prostate gland. Other: Surgical mesh material in the anterior lower pelvis and lower abdomen. No evidence for free fluid or free air. Musculoskeletal: Levoscoliosis of thoracolumbar spine. Multilevel degenerative disease in lumbar spine. IMPRESSION: Large hiatal hernia with dilated loops of small bowel. Findings are suggestive for at least a partial small bowel obstruction. The bowel gas pattern is well demonstrated on the topogram images and could be followed with abdominal radiographs. Renal cysts. Scoliosis with multilevel degenerative bone disease. Postsurgical changes in the chest. Electronically Signed   By: Markus Daft M.D.   On: 06/12/2017 13:52    Assessment/Plan 1. Sciatica of right side  Continue Aspercreme 4% Lidocaine patch- apply 1 patch Q Day to lower back, right upper buttock- Apply after 12 hours  Continue Tramadol 50 mg po Q 4 hours prn  Schedule APAP 650 mg po QID  Schedule Methocarbamol 500 mg 1/2 tablet po QID  2. Generalized Muscle Weakness  Continue PT/OT  Safety precautions  3.  Z51.81-  Encounter for Therapeutic Drug Level Monitoring  Coumadin 3 mg PO Q Day at 1700 for Atrial Fibrillation and artificial heart valve  Recheck  INR 2  days  Monitor for s/s of bleeding, bruising, hematuria, hematemesis, melena  Check INR 3 days after initiation of antibiotic, when applicable.   Family/ staff Communication:   Total Time:  Documentation:  Face to Face:  Family/Phone:   Labs/tests ordered:  PT/INR in 2 days  Medication list reviewed and assessed for continued appropriateness. Monthly medication orders reviewed and signed.  Vikki Ports, NP-C Geriatrics [facility] [phone], [place] [address] Cell Phone (Mon-Fri 8am-5pm):  [phone] On Call:  [phone] & follow prompts after 5pm & weekends Office Phone:  [phone] Office Fax:  [phone]

## 2017-06-29 ENCOUNTER — Other Ambulatory Visit
Admission: RE | Admit: 2017-06-29 | Discharge: 2017-06-29 | Disposition: A | Discharge status: Home / Self Care | Payer: Medicare Other | Source: Skilled Nursing Facility | Attending: Gerontology | Admitting: Gerontology

## 2017-06-29 DIAGNOSIS — I482 Chronic atrial fibrillation: Secondary | ICD-10-CM | POA: Insufficient documentation

## 2017-06-29 LAB — PROTIME-INR
INR: 1.17
PROTHROMBIN TIME: 15 s (ref 11.4–15.2)

## 2017-07-01 ENCOUNTER — Other Ambulatory Visit
Admission: RE | Admit: 2017-07-01 | Discharge: 2017-07-01 | Disposition: A | Discharge status: Home / Self Care | Payer: Medicare Other | Source: Ambulatory Visit | Attending: Gerontology | Admitting: Gerontology

## 2017-07-01 DIAGNOSIS — I482 Chronic atrial fibrillation: Secondary | ICD-10-CM | POA: Diagnosis present

## 2017-07-01 LAB — PROTIME-INR
INR: 1.25
PROTHROMBIN TIME: 15.8 s — AB (ref 11.4–15.2)

## 2017-07-04 ENCOUNTER — Other Ambulatory Visit: Payer: Self-pay | Admitting: Cardiovascular Disease

## 2017-07-04 ENCOUNTER — Ambulatory Visit: Payer: Medicare Other | Admitting: Internal Medicine

## 2017-07-15 ENCOUNTER — Telehealth: Payer: Self-pay | Admitting: Internal Medicine

## 2017-07-15 NOTE — Telephone Encounter (Signed)
I DO NOT KNOW OF ANY DO'S IN [place] OR [place].  I CAN REFER HIM TO A NEUROSURGEON OR A PAIN CLINIC FOR MANAGEMENT OF BACK PAIN

## 2017-07-15 NOTE — Telephone Encounter (Signed)
Pt called and is looking for a DO. Pt was asking to set up with Dr. Lacinda Axon but advised pt that he is leaving the practice. Pt was seeing a DO in [place] but has since closed. Pt states that he stumbled about a month ago and went the the ED, he was told he was dehydrated but pt does not feel that was the main issue. Pt states that he has trouble with his stomach, sciatica, and back pain. He would like to know if Dr. Derrel Nip could recommend someone in [place] or [place] for him. Please advise, thank you!

## 2017-07-15 NOTE — Telephone Encounter (Signed)
Please advise 

## 2017-07-16 NOTE — Telephone Encounter (Signed)
Spoke with pt and he stated that for now he will just try to get in touch with his DO in [place] but if he can't he stated that he would give [place] a call back possibly about a referral.

## 2017-08-04 ENCOUNTER — Other Ambulatory Visit: Payer: Self-pay | Admitting: Internal Medicine

## 2017-08-16 ENCOUNTER — Ambulatory Visit: Payer: Medicare Other | Admitting: Internal Medicine

## 2017-08-16 DIAGNOSIS — Z0289 Encounter for other administrative examinations: Secondary | ICD-10-CM

## 2017-08-16 LAB — PROTIME-INR

## 2017-08-18 ENCOUNTER — Telehealth: Payer: Self-pay | Admitting: Internal Medicine

## 2017-08-18 NOTE — Telephone Encounter (Signed)
Coumadin level (INR) is low at 2.3  (goal is 2.5 to 3.5)   Please confirm current  regimen so I can adjust dose

## 2017-08-19 NOTE — Telephone Encounter (Signed)
Patient notified and voiced understanding by writing down regimen and repeating to nurse.

## 2017-08-19 NOTE — Telephone Encounter (Signed)
Increase dose to 4 mg on Monday wed and Fr[place]   Continue 3 mg all other days  Repeat INR 2 weeks

## 2017-08-19 NOTE — Telephone Encounter (Signed)
Patient currently taking 3 mg of coumadin daily.

## 2017-08-20 ENCOUNTER — Other Ambulatory Visit: Payer: Self-pay | Admitting: Internal Medicine

## 2017-08-29 ENCOUNTER — Other Ambulatory Visit: Payer: Self-pay | Admitting: Cardiovascular Disease

## 2017-09-04 ENCOUNTER — Other Ambulatory Visit: Payer: Self-pay

## 2017-09-04 MED ORDER — METOPROLOL TARTRATE 25 MG PO TABS
25.0000 mg | ORAL_TABLET | Freq: Two times a day (BID) | ORAL | 0 refills | Status: DC
Start: 2017-09-04 — End: 2017-10-08

## 2017-09-04 NOTE — Telephone Encounter (Signed)
Error

## 2017-09-04 NOTE — Telephone Encounter (Signed)
error 

## 2017-09-05 LAB — POCT INR: INR: 4.1 — AB (ref <=1.1)

## 2017-09-06 ENCOUNTER — Other Ambulatory Visit: Payer: Self-pay

## 2017-09-06 ENCOUNTER — Telehealth: Payer: Self-pay | Admitting: *Deleted

## 2017-09-06 NOTE — Telephone Encounter (Signed)
Spoke with pt and informed him of his lab results and medication changes. Pt gave a verbal understanding.

## 2017-09-06 NOTE — Telephone Encounter (Signed)
Kevin [name] reported a INR of 4.1 -the results will be faxed over as well

## 2017-09-06 NOTE — Telephone Encounter (Signed)
redice dose to 3 mg daily starting tonight  .  repeat INR 2 weeks

## 2017-09-06 NOTE — Telephone Encounter (Signed)
Spoke with pt and he stated that he is currently taking 4mg  M, W, F and 3mg  all other days.

## 2017-09-09 ENCOUNTER — Ambulatory Visit: Payer: Medicare Other | Admitting: Cardiovascular Disease

## 2017-09-09 NOTE — Telephone Encounter (Signed)
Error

## 2017-09-10 ENCOUNTER — Ambulatory Visit: Payer: Medicare Other | Admitting: Cardiovascular Disease

## 2017-09-16 ENCOUNTER — Other Ambulatory Visit: Payer: Self-pay | Admitting: Internal Medicine

## 2017-09-18 LAB — PROTIME-INR

## 2017-09-19 ENCOUNTER — Telehealth: Payer: Self-pay

## 2017-09-19 NOTE — Telephone Encounter (Signed)
Received INR results from [facility].   INR 2.7   Result has been placed in yellow folder.

## 2017-09-19 NOTE — Telephone Encounter (Signed)
  Your INR/coumadin level is therapeutic,  Continue current regimen and repeat PT/INR in one month.   

## 2017-09-19 NOTE — Telephone Encounter (Signed)
Patient notified and voiced understanding.

## 2017-09-23 ENCOUNTER — Telehealth: Payer: Self-pay | Admitting: Internal Medicine

## 2017-09-23 ENCOUNTER — Other Ambulatory Visit: Payer: Self-pay | Admitting: Internal Medicine

## 2017-09-23 NOTE — Telephone Encounter (Signed)
INR reported for the second time and therapeutic;  NTD

## 2017-09-26 ENCOUNTER — Ambulatory Visit: Payer: Medicare Other | Admitting: Cardiovascular Disease

## 2017-10-08 ENCOUNTER — Other Ambulatory Visit: Payer: Self-pay | Admitting: Internal Medicine

## 2017-10-10 ENCOUNTER — Encounter: Payer: Self-pay | Admitting: Internal Medicine

## 2017-10-10 LAB — PROTIME-INR

## 2017-10-11 ENCOUNTER — Encounter: Payer: Self-pay | Admitting: Internal Medicine

## 2017-10-11 ENCOUNTER — Telehealth: Payer: Self-pay | Admitting: Internal Medicine

## 2017-10-11 ENCOUNTER — Ambulatory Visit (INDEPENDENT_AMBULATORY_CARE_PROVIDER_SITE_OTHER): Payer: Medicare Other | Admitting: Internal Medicine

## 2017-10-11 VITALS — BP 126/62 | HR 80 | Temp 97.8°F | Resp 15 | Ht 67.0 in | Wt 138.4 lb

## 2017-10-11 DIAGNOSIS — N179 Acute kidney failure, unspecified: Secondary | ICD-10-CM

## 2017-10-11 DIAGNOSIS — M5431 Sciatica, right side: Secondary | ICD-10-CM | POA: Diagnosis not present

## 2017-10-11 DIAGNOSIS — Z7901 Long term (current) use of anticoagulants: Secondary | ICD-10-CM | POA: Diagnosis not present

## 2017-10-11 DIAGNOSIS — D508 Other iron deficiency anemias: Secondary | ICD-10-CM

## 2017-10-11 DIAGNOSIS — E559 Vitamin D deficiency, unspecified: Secondary | ICD-10-CM

## 2017-10-11 LAB — COMPREHENSIVE METABOLIC PANEL
ALT: 18 U/L (ref 0–53)
AST: 28 U/L (ref 0–37)
Albumin: 4.5 g/dL (ref 3.5–5.2)
Alkaline Phosphatase: 61 U/L (ref 39–117)
BUN: 15 mg/dL (ref 6–23)
CO2: 30 meq/L (ref 19–32)
Calcium: 9.9 mg/dL (ref 8.4–10.5)
Chloride: 98 mEq/L (ref 96–112)
Creatinine, Ser: 0.86 mg/dL (ref 0.40–1.50)
GFR: 88.73 mL/min (ref >60.00)
GLUCOSE: 99 mg/dL (ref 70–99)
POTASSIUM: 4 meq/L (ref 3.5–5.1)
SODIUM: 134 meq/L — AB (ref 135–145)
TOTAL PROTEIN: 7.2 g/dL (ref 6.0–8.3)
Total Bilirubin: 0.7 mg/dL (ref 0.2–1.2)

## 2017-10-11 LAB — CBC WITH DIFFERENTIAL/PLATELET
BASOS ABS: 0 10*3/uL (ref 0.0–0.1)
Basophils Relative: 1.2 % (ref 0.0–3.0)
Eosinophils Absolute: 0.2 10*3/uL (ref 0.0–0.7)
Eosinophils Relative: 5.2 % — ABNORMAL HIGH (ref 0.0–5.0)
HCT: 34.8 % — ABNORMAL LOW (ref 39.0–52.0)
HEMOGLOBIN: 11.7 g/dL — AB (ref 13.0–17.0)
LYMPHS ABS: 0.7 10*3/uL (ref 0.7–4.0)
LYMPHS PCT: 17.6 % (ref 12.0–46.0)
MCHC: 33.7 g/dL (ref 30.0–36.0)
MCV: 97.1 fl (ref 78.0–100.0)
MONO ABS: 0.5 10*3/uL (ref 0.1–1.0)
Monocytes Relative: 12.5 % — ABNORMAL HIGH (ref 3.0–12.0)
NEUTROS ABS: 2.6 10*3/uL (ref 1.4–7.7)
NEUTROS PCT: 63.5 % (ref 43.0–77.0)
Platelets: 220 10*3/uL (ref 150.0–400.0)
RBC: 3.58 Mil/uL — AB (ref 4.22–5.81)
RDW: 13.2 % (ref 11.5–15.5)
WBC: 4 10*3/uL (ref 4.0–10.5)

## 2017-10-11 LAB — PROTIME-INR
INR: 2.8 ratio — ABNORMAL HIGH (ref 0.8–1.0)
PROTHROMBIN TIME: 30.4 s — AB (ref 9.6–13.1)

## 2017-10-11 LAB — IRON,TIBC AND FERRITIN PANEL
%SAT: 29 % (ref 15–60)
FERRITIN: 36 ng/mL (ref 20–380)
IRON: 106 ug/dL (ref 50–180)
TIBC: 360 mcg/dL (calc) (ref 250–425)

## 2017-10-11 LAB — VITAMIN B12: Vitamin B-12: 749 pg/mL (ref 211–911)

## 2017-10-11 LAB — VITAMIN D 25 HYDROXY (VIT D DEFICIENCY, FRACTURES): VITD: 51.59 ng/mL (ref 30.00–100.00)

## 2017-10-11 NOTE — Telephone Encounter (Signed)
Patient notified and voiced understanding.

## 2017-10-11 NOTE — Progress Notes (Signed)
Subjective:  Patient ID: Kevin [name], male    DOB: 06-Feb-1925  Age: 83 y.o. MRN: [id number]  CC: The primary encounter diagnosis was Long term current use of anticoagulant therapy. Diagnoses of Other iron deficiency anemia, Vitamin D deficiency, Sciatica of right side, and Acute renal failure, unspecified acute renal failure type [facility]) were also pertinent to this visit.  HPI Kevin [name] presents for follow up on chronic issuess last seenin June   Wt loss of 6 lbs since July.   No falls,  But had a stumble during a bout of gastroenteritits that resulted in dehydration. The stumble aggravated his back pain and the RN at [facility] sent him to ER .  He was hospitalized  for 3 days,  INR was 8.0,  Was given 5 mg of vitamin K which dropped INR to 5.5 . Sent to rehab at [place].  appetite was poor for a while ,  But has now improved and he has gained 2 lbs .  Labs and DC summary reviewed.  Significant anema not commented upon  Saw Kubinski, DO  For  back pain .  Wanted manipulation but PT was prescribed.  He did not go since he had already tried PT>  Has not had referral to [facility] for ESI .  No prior MRI   Walking every day at [facility] for exercise, wallkng > 50 yards aggravates his pain . Tires very easily.  Has to rest, not short of breath.   Takes tylenol prn   Chronic constipation managed with prunes and BFL..   Lab Results  Component Value Date   TSH 1.56 01/21/2017    Taking 3 mg coudamdin daily.  INR was done yesterday at [facility]. results not available.   Outpatient Medications Prior to Visit  Medication Sig Dispense Refill  . acetaminophen (TYLENOL) 325 MG tablet Take 650 mg by mouth every 4 (four) hours as needed for fever. Maximum dose for 24 hours is 3000 mg from all sources of Acetaminophen/tylenol    . Alpha-Lipoic Acid 50 MG CAPS Take 1 capsule by mouth daily.    Marland Kitchen amLODipine (NORVASC) 5 MG tablet TAKE 1 TABLET(5 MG) BY MOUTH DAILY 30 tablet 0  . Ascorbic  Acid (VITAMIN C) 100 MG tablet Take 100 mg by mouth daily.    . brimonidine (ALPHAGAN) 0.15 % ophthalmic solution Place 1 drop into both eyes 2 (two) times daily.     . Calcium-Magnesium-Vitamin D 400-166.7-133.3 MG-MG-UNIT TABS Take 1 tablet by mouth 2 (two) times daily.    . cholecalciferol (VITAMIN D) 1000 units tablet Take 1,000 Units by mouth daily.    Marland Kitchen co-enzyme Q-10 30 MG capsule Take 1 capsule (30 mg total) by mouth daily. 90 capsule 3  . ferrous sulfate 324 (65 FE) MG TBEC Take 1 tablet by mouth every other day.    . fluocinonide cream (LIDEX) 2.44 % Apply 1 application topically 2 (two) times daily.  0  . furosemide (LASIX) 20 MG tablet Take 1 tablet (20 mg total) by mouth daily. 30 tablet 2  . latanoprost (XALATAN) 0.005 % ophthalmic solution Place 1 drop into both eyes at bedtime.     . Lidocaine (ASPERCREME LIDOCAINE) 4 % PTCH Apply 1 patch topically daily. Apply to base of back/upper right buttock for sciatic pain. Remove patch after 12 hours.    Marland Kitchen losartan (COZAAR) 50 MG tablet Take 50 mg by mouth daily.    . meclizine (ANTIVERT) 25 MG tablet Take 25  mg by mouth 3 (three) times daily as needed for dizziness.    . Melatonin 5 MG TABS Take 1 tablet by mouth at bedtime.     . metoprolol tartrate (LOPRESSOR) 25 MG tablet TAKE 1 TABLET(25 MG) BY MOUTH TWICE DAILY 60 tablet 0  . omeprazole (PRILOSEC) 40 MG capsule Take 40 mg by mouth daily at 6 (six) AM.     . omeprazole (PRILOSEC) 40 MG capsule TAKE 1 CAPSULE(40 MG) BY MOUTH DAILY 90 capsule 0  . Red Yeast Rice 600 MG CAPS Take 1 capsule by mouth daily.    Marland Kitchen RUTIN PO Take 500 mg by mouth daily.    . traMADol (ULTRAM) 50 MG tablet Take 50 mg by mouth every 4 (four) hours as needed.    . Turmeric 500 MG CAPS Take 1 capsule by mouth daily.    Marland Kitchen warfarin (COUMADIN) 2.5 MG tablet Take 2.5 mg by mouth daily. At 5 pm    . warfarin (COUMADIN) 3 MG tablet TAKE 1 TABLET BY MOUTH ON TUESDAY, THURSDAY, SATURDAY, AND SUNDAY 51 tablet 0  .  escitalopram (LEXAPRO) 10 MG tablet TAKE 1 TABLET(10 MG) BY MOUTH DAILY (Patient not taking: Reported on 10/11/2017) 30 tablet 0   No facility-administered medications prior to visit.     Review of Systems;  Patient denies headache, fevers, malaise, unintentional weight loss, skin rash, eye pain, sinus congestion and sinus pain, sore throat, dysphagia,  hemoptysis , cough, dyspnea, wheezing, chest pain, palpitations, orthopnea, edema, abdominal pain, nausea, melena, diarrhea, constipation, flank pain, dysuria, hematuria, urinary  Frequency, nocturia, numbness, tingling, seizures,  Focal weakness, Loss of consciousness,  Tremor, insomnia, depression, anxiety, and suicidal ideation.      Objective:  BP 126/62 (BP Location: Left Arm, Patient Position: Sitting, Cuff Size: Normal)   Pulse 80   Temp 97.8 F (36.6 C) (Oral)   Resp 15   Ht 5\' 7"  (1.702 m)   Wt 138 lb 6.4 oz (62.8 kg)   SpO2 94%   BMI 21.68 kg/m   BP Readings from Last 3 Encounters:  10/11/17 126/62  06/27/17 124/66  06/21/17 124/66    Wt Readings from Last 3 Encounters:  10/11/17 138 lb 6.4 oz (62.8 kg)  06/27/17 142 lb 3.2 oz (64.5 kg)  06/21/17 133 lb 6.4 oz (60.5 kg)    General appearance: alert, cooperative and appears stated age Ears: normal TM's and external ear canals both ears Throat: lips, mucosa, and tongue normal; teeth and gums normal Neck: no adenopathy, no carotid bruit, supple, symmetrical, trachea midline and thyroid not enlarged, symmetric, no tenderness/mass/nodules Back: symmetric, no curvature. ROM normal. No CVA tenderness. Lungs: clear to auscultation bilaterally Heart: regular rate and rhythm, S1, S2 normal, no murmur, click, rub or gallop Abdomen: soft, non-tender; bowel sounds normal; no masses,  no organomegaly Pulses: 2+ and symmetric Skin: Skin color, texture, turgor normal. No rashes or lesions Lymph nodes: Cervical, supraclavicular, and axillary nodes normal.  No results found for:  HGBA1C  Lab Results  Component Value Date   CREATININE 0.86 10/11/2017   CREATININE 0.86 06/27/2017   CREATININE 0.74 06/19/2017    Lab Results  Component Value Date   WBC 4.0 10/11/2017   HGB 11.7 (L) 10/11/2017   HCT 34.8 (L) 10/11/2017   PLT 220.0 10/11/2017   GLUCOSE 99 10/11/2017   CHOL 196 01/19/2016   TRIG 67 01/19/2016   HDL 59 01/19/2016   LDLDIRECT 105.9 03/13/2012   LDLCALC 124 01/19/2016   ALT 18  10/11/2017   AST 28 10/11/2017   NA 134 (L) 10/11/2017   K 4.0 10/11/2017   CL 98 10/11/2017   CREATININE 0.86 10/11/2017   BUN 15 10/11/2017   CO2 30 10/11/2017   TSH 1.56 01/21/2017   INR 2.8 (H) 10/11/2017    No results found.  Assessment & Plan:   Problem List Items Addressed This Visit    Acute renal failure (ARF) ([place])    Secondary to dehydration from gastroenteritis.  Resolved by repeat testing.   Lab Results  Component Value Date   CREATININE 0.86 10/11/2017         Anemia, iron deficiency    He had a drop in hgb during recent hospitalization for dehdyration and INR of 8.  Repeat hgb has improved, and iron stores are normal.   Lab Results  Component Value Date   WBC 4.0 10/11/2017   HGB 11.7 (L) 10/11/2017   HCT 34.8 (L) 10/11/2017   MCV 97.1 10/11/2017   PLT 220.0 10/11/2017   Lab Results  Component Value Date   IRON 106 10/11/2017   TIBC 360 10/11/2017   FERRITIN 36 10/11/2017   Lab Results  Component Value Date   VITAMINB12 749 10/11/2017         Relevant Orders   Iron, TIBC and Ferritin Panel (Completed)   Comprehensive metabolic panel (Completed)   Vitamin B12 (Completed)   Fecal occult blood, imunochemical   Long term current use of anticoagulant therapy - Primary    INR is therapeutic for INR 2.8 on 3 mg daily .  Repeat in one month.       Relevant Orders   CBC with Differential/Platelet (Completed)   INR/PT (Completed)   Sciatica of right side    He has persistent pain.  referral to Dr Loistine Chance.       Relevant  Orders   Ambulatory referral to Sports Medicine   Vitamin D deficiency    He has taken megadoses of Vitamin d  In the past and current level is normal       Relevant Orders   VITAMIN D 25 Hydroxy (Vit-D Deficiency, Fractures) (Completed)      I have discontinued Kevin [name]'s escitalopram. I am also having him maintain his brimonidine, latanoprost, vitamin C, ferrous sulfate, co-enzyme Q-10, cholecalciferol, fluocinonide cream, meclizine, omeprazole, furosemide, Alpha-Lipoic Acid, Calcium-Magnesium-Vitamin D, losartan, Melatonin, Red Yeast Rice, RUTIN PO, Turmeric, Lidocaine, traMADol, acetaminophen, warfarin, omeprazole, warfarin, amLODipine, and metoprolol tartrate.  No orders of the defined types were placed in this encounter.   Medications Discontinued During This Encounter  Medication Reason  . escitalopram (LEXAPRO) 10 MG tablet Patient has not taken in last 30 days    Follow-up: Return in about 6 months (around 04/10/2018).   Crecencio Mc, MD

## 2017-10-11 NOTE — Patient Instructions (Addendum)
Your anemia has worsened based on the labs done during your hospitalization  I am checking your iron stores today  o am making a referral to Dr. Sharlet Salina for your back pain

## 2017-10-11 NOTE — Telephone Encounter (Signed)
Coumadin level is therapeutic,  Continue current regimen of 3 mg daily and repeat PT/INR in one month

## 2017-10-13 NOTE — Assessment & Plan Note (Addendum)
He has taken megadoses of Vitamin d  In the past and current level is normal

## 2017-10-13 NOTE — Assessment & Plan Note (Addendum)
INR is therapeutic for INR 2.8 on 3 mg daily .  Repeat in one month.

## 2017-10-13 NOTE — Assessment & Plan Note (Signed)
Secondary to dehydration from gastroenteritis.  Resolved by repeat testing.   Lab Results  Component Value Date   CREATININE 0.86 10/11/2017

## 2017-10-13 NOTE — Assessment & Plan Note (Addendum)
He had a drop in hgb during recent hospitalization for dehdyration and INR of 8.  Repeat hgb has improved, and iron stores are normal.   Lab Results  Component Value Date   WBC 4.0 10/11/2017   HGB 11.7 (L) 10/11/2017   HCT 34.8 (L) 10/11/2017   MCV 97.1 10/11/2017   PLT 220.0 10/11/2017   Lab Results  Component Value Date   IRON 106 10/11/2017   TIBC 360 10/11/2017   FERRITIN 36 10/11/2017   Lab Results  Component Value Date   VITAMINB12 749 10/11/2017

## 2017-10-13 NOTE — Assessment & Plan Note (Addendum)
He has persistent pain.  referral to Dr Loistine Chance.

## 2017-10-21 ENCOUNTER — Other Ambulatory Visit: Payer: Self-pay | Admitting: Physical Medicine and Rehabilitation

## 2017-10-21 DIAGNOSIS — M5416 Radiculopathy, lumbar region: Secondary | ICD-10-CM

## 2017-10-22 ENCOUNTER — Telehealth: Payer: Self-pay

## 2017-10-22 ENCOUNTER — Other Ambulatory Visit: Payer: Self-pay | Admitting: Internal Medicine

## 2017-10-22 NOTE — Telephone Encounter (Signed)
  Your INR/coumadin level is therapeutic,  Continue current regimen and repeat PT/INR in one month.   

## 2017-10-22 NOTE — Telephone Encounter (Signed)
Spoke with pt and informed him of his lab results. Pt gave a verbal understanding.  

## 2017-10-22 NOTE — Telephone Encounter (Signed)
INR result of 2.8. Result has been placed in yellow folder.

## 2017-10-28 ENCOUNTER — Other Ambulatory Visit: Payer: Self-pay | Admitting: Internal Medicine

## 2017-10-28 ENCOUNTER — Ambulatory Visit: Payer: Medicare Other

## 2017-10-30 ENCOUNTER — Ambulatory Visit
Admission: RE | Admit: 2017-10-30 | Discharge: 2017-10-30 | Disposition: A | Discharge status: Home / Self Care | Payer: Medicare Other | Source: Ambulatory Visit | Attending: Physical Medicine and Rehabilitation | Admitting: Physical Medicine and Rehabilitation

## 2017-10-30 DIAGNOSIS — M5416 Radiculopathy, lumbar region: Secondary | ICD-10-CM

## 2017-10-30 DIAGNOSIS — I517 Cardiomegaly: Secondary | ICD-10-CM | POA: Insufficient documentation

## 2017-10-30 DIAGNOSIS — M4316 Spondylolisthesis, lumbar region: Secondary | ICD-10-CM | POA: Diagnosis not present

## 2017-10-30 DIAGNOSIS — K449 Diaphragmatic hernia without obstruction or gangrene: Secondary | ICD-10-CM | POA: Insufficient documentation

## 2017-10-30 DIAGNOSIS — M5136 Other intervertebral disc degeneration, lumbar region: Secondary | ICD-10-CM | POA: Diagnosis not present

## 2017-11-01 ENCOUNTER — Other Ambulatory Visit: Payer: Self-pay | Admitting: Internal Medicine

## 2017-11-03 ENCOUNTER — Other Ambulatory Visit: Payer: Self-pay | Admitting: Internal Medicine

## 2017-11-03 ENCOUNTER — Telehealth: Payer: Self-pay | Admitting: Internal Medicine

## 2017-11-08 ENCOUNTER — Other Ambulatory Visit: Payer: Self-pay | Admitting: Internal Medicine

## 2017-11-20 ENCOUNTER — Telehealth: Payer: Self-pay

## 2017-11-20 NOTE — Telephone Encounter (Signed)
  Your INR/coumadin level is therapeutic,  Continue current regimen and repeat PT/INR in one month.   

## 2017-11-20 NOTE — Telephone Encounter (Signed)
PT/INR result:  3.1

## 2017-11-20 NOTE — Telephone Encounter (Signed)
Spoke with pt and informed him of his lab results. Pt gave a verbal understanding.  

## 2017-11-22 ENCOUNTER — Emergency Department: Payer: Medicare Other

## 2017-11-22 ENCOUNTER — Emergency Department
Admission: EM | Admit: 2017-11-22 | Discharge: 2017-11-22 | Disposition: A | Discharge status: Home / Self Care | Payer: Medicare Other | Attending: Student in an Organized Health Care Education/Training Program | Admitting: Student in an Organized Health Care Education/Training Program

## 2017-11-22 DIAGNOSIS — Z85820 Personal history of malignant melanoma of skin: Secondary | ICD-10-CM | POA: Diagnosis not present

## 2017-11-22 DIAGNOSIS — I110 Hypertensive heart disease with heart failure: Secondary | ICD-10-CM | POA: Diagnosis not present

## 2017-11-22 DIAGNOSIS — Z79899 Other long term (current) drug therapy: Secondary | ICD-10-CM | POA: Insufficient documentation

## 2017-11-22 DIAGNOSIS — Z952 Presence of prosthetic heart valve: Secondary | ICD-10-CM | POA: Diagnosis not present

## 2017-11-22 DIAGNOSIS — I509 Heart failure, unspecified: Secondary | ICD-10-CM | POA: Insufficient documentation

## 2017-11-22 DIAGNOSIS — Z7901 Long term (current) use of anticoagulants: Secondary | ICD-10-CM | POA: Insufficient documentation

## 2017-11-22 DIAGNOSIS — Z951 Presence of aortocoronary bypass graft: Secondary | ICD-10-CM | POA: Insufficient documentation

## 2017-11-22 DIAGNOSIS — R1031 Right lower quadrant pain: Secondary | ICD-10-CM | POA: Diagnosis present

## 2017-11-22 LAB — COMPREHENSIVE METABOLIC PANEL
ALT: 20 U/L (ref 17–63)
AST: 32 U/L (ref 15–41)
Albumin: 4.2 g/dL (ref 3.5–5.0)
Alkaline Phosphatase: 70 U/L (ref 38–126)
Anion gap: 8 (ref 5–15)
BILIRUBIN TOTAL: 0.8 mg/dL (ref 0.3–1.2)
BUN: 17 mg/dL (ref 6–20)
CO2: 29 mmol/L (ref 22–32)
CREATININE: 0.87 mg/dL (ref 0.61–1.24)
Calcium: 9.6 mg/dL (ref 8.9–10.3)
Chloride: 95 mmol/L — ABNORMAL LOW (ref 101–111)
GFR calc Af Amer: >60 mL/min (ref >60)
Glucose, Bld: 118 mg/dL — ABNORMAL HIGH (ref 65–99)
Potassium: 4 mmol/L (ref 3.5–5.1)
Sodium: 132 mmol/L — ABNORMAL LOW (ref 135–145)
TOTAL PROTEIN: 7.1 g/dL (ref 6.5–8.1)

## 2017-11-22 LAB — CBC WITH DIFFERENTIAL/PLATELET
BASOS ABS: 0 10*3/uL (ref 0–0.1)
Basophils Relative: 0 %
Eosinophils Absolute: 0.2 10*3/uL (ref 0–0.7)
Eosinophils Relative: 4 %
HEMATOCRIT: 32.5 % — AB (ref 40.0–52.0)
HEMOGLOBIN: 11.1 g/dL — AB (ref 13.0–18.0)
LYMPHS PCT: 11 %
Lymphs Abs: 0.4 10*3/uL — ABNORMAL LOW (ref 1.0–3.6)
MCH: 33.5 pg (ref 26.0–34.0)
MCHC: 34.2 g/dL (ref 32.0–36.0)
MCV: 98 fL (ref 80.0–100.0)
Monocytes Absolute: 0.5 10*3/uL (ref 0.2–1.0)
Monocytes Relative: 13 %
NEUTROS ABS: 3 10*3/uL (ref 1.4–6.5)
NEUTROS PCT: 72 %
Platelets: 215 10*3/uL (ref 150–440)
RBC: 3.32 MIL/uL — AB (ref 4.40–5.90)
RDW: 14 % (ref 11.5–14.5)
WBC: 4.2 10*3/uL (ref 3.8–10.6)

## 2017-11-22 LAB — LIPASE, BLOOD: LIPASE: 43 U/L (ref 11–51)

## 2017-11-22 LAB — LACTIC ACID, PLASMA: Lactic Acid, Venous: 1.1 mmol/L (ref 0.5–1.9)

## 2017-11-22 MED ORDER — IOPAMIDOL (ISOVUE-300) INJECTION 61%
100.0000 mL | Freq: Once | INTRAVENOUS | Status: AC | PRN
  Administered 2017-11-22: 100 mL via INTRAVENOUS

## 2017-11-22 MED ORDER — FENTANYL CITRATE (PF) 100 MCG/2ML IJ SOLN
12.5000 ug | INTRAMUSCULAR | Status: DC | PRN
  Administered 2017-11-22: 12.5 ug via INTRAVENOUS
  Filled 2017-11-22: qty 2

## 2017-11-22 MED ORDER — POLYETHYLENE GLYCOL 3350 17 G PO PACK: 17.0000 g | PACK | Freq: Every day | ORAL | 0 refills | Status: DC

## 2017-11-22 NOTE — ED Triage Notes (Signed)
Patient to ED from [facility] at [facility] c/o abdominal pain. Per EMS patient c/o RLQ abdominal pain and was recently diagnosed with a hernia that could not be corrected with surgery. He is alert and oriented x4, and denies NVD. No acute distress noted at this time.

## 2017-11-22 NOTE — Discharge Instructions (Signed)

## 2017-11-22 NOTE — ED Notes (Signed)
Patient transported to CT 

## 2017-11-22 NOTE — ED Notes (Signed)
Pt ambulatory to wheelchair upon discharge. Delsa Bern, EMT-P to take pt out and help him get into friend's vehicle. Pt a&o x4. Skin warm and dry. Verbalized understanding of discharge instructions, prescription and follow-up care.

## 2017-11-22 NOTE — ED Provider Notes (Signed)
[facility] Emergency Department Provider Note    First MD Initiated Contact with Patient 11/22/17 [phone]     (approximate)  I have reviewed the triage vital signs and the nursing notes.   HISTORY  Chief Complaint Abdominal Pain    HPI Kevin [name] is a 83 y.o. male for multiple chronic medical conditions presents with chief complaint of less than 1 day of right lower quadrant pain.  Patient states that he has had similar pain in the past and was related to a hernia but the pain became worse today.  No fevers at home.  No nausea or vomiting.  The patient is still passing gas.  No diarrhea.  No blood in his stool.  Denies any trauma.  No dysuria or hematuria.  Denies any shortness of breath.  States the pain is mild to moderate in severity and nonradiating.  Describes as an achy pain.  Past Medical History:  Diagnosis Date  . Atrial fibrillation ([place])   . Cancer ([place])    melanoma- right ear  . Cardiomyopathy, secondary ([place])   . Chronic headaches started age 36  . Congestive heart failure (CHF) ([place])   . Coronary atherosclerosis of native coronary artery   . DJD (degenerative joint disease)   . Essential hypertension, benign   . GERD (gastroesophageal reflux disease)   . Glaucoma   . History of mitral valve replacement with mechanical valve   . Hyperlipidemia, unspecified   . Hypertension   . Lumbago   . Osteoporosis    secondary to low testoerone  . S/P MVR (mitral valve replacement)   . Sciatica   . Scoliosis    Family History  Problem Relation Age of Onset  . Cancer Father        unknown  . Alzheimer's disease Brother   . Arrhythmia Brother   . Mental illness Mother    Past Surgical History:  Procedure Laterality Date  . CARDIOVERSION  2013  . CATARACT EXTRACTION, BILATERAL     x 2  . CORONARY ARTERY BYPASS GRAFT  12/2014   coronary artery bypass w/arterial grafts   . HERNIA REPAIR     x 2  . MELANOMA EXCISION     right ear  .  MITRAL VALVE REPLACEMENT  1994   [place]  . NASAL SEPTUM SURGERY  1963  . PERMANENT PACEMAKER INSERTION Left 12/22/2014   Patient Active Problem List   Diagnosis Date Noted  . Acute renal failure (ARF) ([place]) 06/12/2017  . Encounter for preventive health examination 03/30/2017  . Generalized muscle weakness 07/24/2016  . Major depressive disorder, recurrent episode ([place]) 01/24/2016  . Vitamin D deficiency 01/24/2016  . Fatigue 01/23/2016  . Benign essential HTN 03/04/2015  . H/O prosthetic heart valve 03/04/2015  . Symptomatic bradycardia 02/21/2015  . Vertigo 02/21/2015  . Exertional dyspnea 06/25/2014  . Cardiomyopathy ([place]) 03/09/2014  . Second degree AV block, Mobitz type I 11/20/2013  . Secondary cardiomyopathy ([place]) 11/18/2013  . Hyperlipidemia 04/27/2013  . Sciatica of right side 03/16/2013  . Other and unspecified hyperlipidemia 03/16/2013  . Anemia, iron deficiency 09/15/2012  . Osteoporosis, senile 09/15/2012  . Atrial flutter ([place]) 06/26/2012  . Depression 12/24/2011  . Hypertension 08/25/2011  . Neuropathy of lower extremity 08/25/2011  . Bradycardia 08/25/2011  . Atrial fibrillation ([place])   . History of mitral valve replacement with mechanical valve   . Long term current use of anticoagulant therapy 08/22/2011      Prior to  Admission medications   Medication Sig Start Date End Date Taking? Authorizing Provider  Alpha-Lipoic Acid 50 MG CAPS Take 1 capsule by mouth daily.   Yes [provider]  amLODipine (NORVASC) 5 MG tablet TAKE 1 TABLET(5 MG) BY MOUTH DAILY 10/28/17  Yes Crecencio Mc, MD  Ascorbic Acid (VITAMIN C) 100 MG tablet Take 100 mg by mouth daily.   Yes [provider]  brimonidine (ALPHAGAN) 0.15 % ophthalmic solution Place 1 drop into both eyes 2 (two) times daily.  08/21/11  Yes [provider]  Calcium-Magnesium-Vitamin D 400-166.7-133.3 MG-MG-UNIT TABS Take 1 tablet by mouth 2 (two) times daily.   Yes [provider]  co-enzyme Q-10 30 MG capsule Take 1 capsule (30 mg total) by mouth daily. 05/26/15  Yes Crecencio Mc, MD  escitalopram (LEXAPRO) 10 MG tablet Take 1 tablet by mouth daily. 08/18/17  Yes [provider]  ferrous sulfate 324 (65 FE) MG TBEC Take 1 tablet by mouth every other day. 06/13/12  Yes Crecencio Mc, MD  furosemide (LASIX) 20 MG tablet Take 1 tablet (20 mg total) by mouth daily. 06/18/17  Yes Fritzi Mandes, MD  latanoprost (XALATAN) 0.005 % ophthalmic solution Place 1 drop into both eyes at bedtime.  08/13/11  Yes [provider]  losartan (COZAAR) 50 MG tablet Take 50 mg by mouth daily.   Yes [provider]  Melatonin 5 MG TABS Take 1 tablet by mouth at bedtime.    Yes [provider]  metoprolol tartrate (LOPRESSOR) 25 MG tablet TAKE 1 TABLET(25 MG) BY MOUTH TWICE DAILY 11/08/17  Yes Crecencio Mc, MD  omeprazole (PRILOSEC) 40 MG capsule TAKE 1 CAPSULE(40 MG) BY MOUTH DAILY 11/01/17  Yes Crecencio Mc, MD  Red Yeast Rice 600 MG CAPS Take 1 capsule by mouth daily.   Yes [provider]  RUTIN PO Take 500 mg by mouth daily.   Yes [provider]  Turmeric 500 MG CAPS Take 1 capsule by mouth daily.   Yes [provider]  warfarin (COUMADIN) 3 MG tablet Take 1 tablet (3 mg total) by mouth daily for 1 dose. 11/04/17 11/22/17 Yes Crecencio Mc, MD  acetaminophen (TYLENOL) 325 MG tablet Take 650 mg by mouth every 4 (four) hours as needed for fever. Maximum dose for 24 hours is 3000 mg from all sources of Acetaminophen/tylenol    [provider]  fluocinonide cream (LIDEX) 6.27 % Apply 1 application topically 2 (two) times daily. 01/07/17   [provider]  Lidocaine (ASPERCREME LIDOCAINE) 4 % PTCH Apply 1 patch topically daily. Apply to base of back/upper right buttock for sciatic pain. Remove patch after 12 hours.    [provider]  meclizine (ANTIVERT) 25 MG tablet Take 25 mg by mouth 3  (three) times daily as needed for dizziness.    [provider]  polyethylene glycol (MIRALAX / GLYCOLAX) packet Take 17 g by mouth daily. Mix one tablespoon with 8oz of your favorite juice or water every day until you are having soft formed stools. Then start taking once daily if you didn't have a stool the day before. 11/22/17   Merlyn Lot, MD    Allergies Cymbalta [duloxetine hcl] and Aspirin    Social History Social History   Tobacco Use  . Smoking status: Never Smoker  . Smokeless tobacco: Never Used  Substance Use Topics  . Alcohol use: No  . Drug use: No    Review of Systems  Patient denies headaches, rhinorrhea, blurry vision, numbness, shortness of breath, chest pain, edema, cough, abdominal pain, nausea, vomiting, diarrhea, dysuria, fevers, rashes or hallucinations unless otherwise stated above in HPI. ____________________________________________   PHYSICAL EXAM:  VITAL SIGNS: Vitals:   11/22/17 1200 11/22/17 1300  BP: (!) 142/70 (!) 152/79  Pulse: 63 (!) 59  Resp: 16 (!) 25  Temp:    SpO2: 97% 97%    Constitutional: Alert and oriented. in no acute distress. Eyes: Conjunctivae are normal.  Head: Atraumatic. Nose: No congestion/rhinnorhea. Mouth/Throat: Mucous membranes are moist.   Neck: No stridor. Painless ROM.  Cardiovascular: Normal rate, regular rhythm. Grossly normal heart sounds.  Good peripheral circulation. Respiratory: Normal respiratory effort.  No retractions. Lungs CTAB. Gastrointestinal: Soft with ttp of RLQ. No distention. No abdominal bruits. No CVA tenderness. Genitourinary: normal external genitalia Musculoskeletal: No lower extremity tenderness nor edema.  No joint effusions. Neurologic:  Normal speech and language. No gross focal neurologic deficits are appreciated. No facial droop Skin:  Skin is warm, dry and intact. No rash noted. Psychiatric: Mood and affect are normal. Speech and behavior are  normal.  ____________________________________________   LABS (all labs ordered are listed, but only abnormal results are displayed)  Results for orders placed or performed during the hospital encounter of 11/22/17 (from the past 24 hour(s))  CBC with Differential/Platelet     Status: Abnormal   Collection Time: 11/22/17 10:48 AM  Result Value Ref Range   WBC 4.2 3.8 - 10.6 K/uL   RBC 3.32 (L) 4.40 - 5.90 MIL/uL   Hemoglobin 11.1 (L) 13.0 - 18.0 g/dL   HCT 32.5 (L) 40.0 - 52.0 %   MCV 98.0 80.0 - 100.0 fL   MCH 33.5 26.0 - 34.0 pg   MCHC 34.2 32.0 - 36.0 g/dL   RDW 14.0 11.5 - 14.5 %   Platelets 215 150 - 440 K/uL   Neutrophils Relative % 72 %   Neutro Abs 3.0 1.4 - 6.5 K/uL   Lymphocytes Relative 11 %   Lymphs Abs 0.4 (L) 1.0 - 3.6 K/uL   Monocytes Relative 13 %   Monocytes Absolute 0.5 0.2 - 1.0 K/uL   Eosinophils Relative 4 %   Eosinophils Absolute 0.2 0 - 0.7 K/uL   Basophils Relative 0 %   Basophils Absolute 0.0 0 - 0.1 K/uL  Comprehensive metabolic panel     Status: Abnormal   Collection Time: 11/22/17 10:48 AM  Result Value Ref Range   Sodium 132 (L) 135 - 145 mmol/L   Potassium 4.0 3.5 - 5.1 mmol/L   Chloride 95 (L) 101 - 111 mmol/L   CO2 29 22 - 32 mmol/L   Glucose, Bld 118 (H) 65 - 99 mg/dL   BUN 17 6 - 20 mg/dL   Creatinine, Ser 0.87 0.61 - 1.24 mg/dL   Calcium 9.6 8.9 - 10.3 mg/dL   Total Protein 7.1 6.5 - 8.1 g/dL   Albumin 4.2 3.5 - 5.0 g/dL   AST 32 15 - 41 U/L   ALT 20 17 - 63 U/L   Alkaline Phosphatase 70 38 - 126 U/L   Total Bilirubin 0.8 0.3 - 1.2 mg/dL   GFR calc non Af Amer >60 >60 mL/min   GFR calc Af Amer >60 >60 mL/min   Anion gap 8 5 - 15  Lipase, blood     Status: None   Collection Time: 11/22/17 10:48 AM  Result Value Ref Range   Lipase 43 11 - 51 U/L  Lactic acid, plasma     Status: None   Collection Time: 11/22/17 10:48 AM  Result Value Ref Range   Lactic Acid, Venous 1.1 0.5 - 1.9 mmol/L    ____________________________________________  EKG My review and personal interpretation at Time: 10:48   Indication: abd pain  Rate: 70  Rhythm: a-v paced Axis: left Other: borderline prolonged qt, no sgarbossa ____________________________________________  RADIOLOGY  I personally reviewed all radiographic images ordered to evaluate for the above acute complaints and reviewed radiology reports and findings.  These findings were personally discussed with the patient.  Please see medical record for radiology report.  ____________________________________________   PROCEDURES  Procedure(s) performed:  Procedures    Critical Care performed: no ____________________________________________   INITIAL IMPRESSION / ASSESSMENT AND PLAN / ED COURSE  Pertinent labs & imaging results that were available during my care of the patient were reviewed by me and considered in my medical decision making (see chart for details).  DDX: Appendicitis.  Strangulate hernia.  Incarcerated hernia, UTI, colitis, diverticulitis, SBO  Kevin [name] is a 83 y.o. who presents to the ED with lower quadrant pain as described above.  Patient in no acute distress.  The patient does have significant tenderness on exam.  Blood work sent for the above differential.  I am concerned for appendicitis therefore will obtain CT imaging.  The patient will be placed on continuous pulse oximetry and telemetry for monitoring.  Laboratory evaluation will be sent to evaluate for the above complaints.     Clinical Course as of Nov 22 1452  Fri Nov 22, 2017  1256 I spoke with radiology regarding the patient's presentation and on further review of the CT imaging there is no evidence of acute intra-abdominal pathology.  [PR]  1305 Repeat abdominal exam soft and benign.  Patient feels comfortable at this time.  This point I do believe patient is stable for follow-up with outpatient surgery.  Was started on stool softeners for  component of constipation.  Discussed signs and symptoms for which he should return immediately to the hospital.  Have discussed with the patient and available family all diagnostics and treatments performed thus far and all questions were answered to the best of my ability. The patient demonstrates understanding and agreement with plan.   [PR]    Clinical Course User Index [PR] Merlyn Lot, MD     ____________________________________________   FINAL CLINICAL IMPRESSION(S) / ED DIAGNOSES  Final diagnoses:  Right lower quadrant abdominal pain      NEW MEDICATIONS STARTED DURING THIS VISIT:  This SmartLink is deprecated. Use AVSMEDLIST instead to display the medication list for a patient.   Note:  This document was prepared using Dragon voice recognition software and may include unintentional dictation errors.    Merlyn Lot, MD 11/22/17 1455

## 2017-11-25 ENCOUNTER — Telehealth: Payer: Self-pay

## 2017-11-25 NOTE — Telephone Encounter (Signed)
It depends.  What diagnosis was he given AND is he still in pain>

## 2017-11-25 NOTE — Telephone Encounter (Signed)
Attempted to call and schedule for Wednesday at 4:30. No answer and no voicemail to leave message

## 2017-11-25 NOTE — Telephone Encounter (Signed)
How soon would you like this patient to be seen? 4:30 slot or wait for next available appt?

## 2017-11-25 NOTE — Telephone Encounter (Signed)
Copied from [place] [phone]. Topic: Appointment Scheduling - Scheduling Inquiry for Clinic >> Nov 25, 2017  3:50 PM Ether Griffins B wrote: Reason for CRM: pt needing ED follow up appt with Dr. Derrel Nip. Was told in ED to follow up pcp right away

## 2017-11-26 NOTE — Telephone Encounter (Signed)
Patient scheduled for Wednesday at 4:30

## 2017-11-27 ENCOUNTER — Ambulatory Visit: Payer: Medicare Other | Admitting: Internal Medicine

## 2017-12-04 NOTE — Telephone Encounter (Signed)
Message sent

## 2017-12-11 ENCOUNTER — Encounter: Payer: Self-pay | Admitting: Internal Medicine

## 2017-12-11 ENCOUNTER — Ambulatory Visit: Payer: Medicare Other | Admitting: Internal Medicine

## 2017-12-11 DIAGNOSIS — K449 Diaphragmatic hernia without obstruction or gangrene: Secondary | ICD-10-CM | POA: Diagnosis not present

## 2017-12-11 DIAGNOSIS — K5904 Chronic idiopathic constipation: Secondary | ICD-10-CM | POA: Diagnosis not present

## 2017-12-11 DIAGNOSIS — M5431 Sciatica, right side: Secondary | ICD-10-CM

## 2017-12-11 NOTE — Patient Instructions (Addendum)
You are allowed to sleep up to ten hours at night,  If it gives you more energy during the day  You can continue the miralax every day to keep  yoour bowels moving regularly  Eating smaller more frequent meals will keep your hiatal hernia  from acting up    [place] COUMADIN ON January 7 .  Have your nurse check  Your INR  On January 9,  2 days before your procedure    `WE WILL FAX THE RESULTS TO DR Loistine Chance ON January 10TH

## 2017-12-11 NOTE — Progress Notes (Signed)
Subjective:  Patient ID: Kevin [name], male    DOB: May 16, 1925  Age: 84 y.o. MRN: [id number]  CC: Diagnoses of Chronic idiopathic constipation, Sciatica of right side, and Large hiatal hernia were pertinent to this visit.  HPI Kevin [name] presents for Er follow up .  Patient was treated and sent home on Dec 14 for RLQ pain.  Evaluation included CT scan which noted : normal  appendix,  Fat filled right inguinal hernia  Distended bladder ,  Prostate calcifications,  Moderate Constipation ,  Diverticulosis without itis  ,  Atherosclerosis.  He was diagnosed with constipation and advised to use miralax.     He no recurrence since then . moving bowels more regularly with the miralax    He is scheduled to undergo ESI on Jan 11 ,  Needs INR  One day prior to procedure. However, is INR goal is 2.5 to 3.5 and gets his INR done at [facility]   Outpatient Medications Prior to Visit  Medication Sig Dispense Refill  . acetaminophen (TYLENOL) 325 MG tablet Take 650 mg by mouth every 4 (four) hours as needed for fever. Maximum dose for 24 hours is 3000 mg from all sources of Acetaminophen/tylenol    . Alpha-Lipoic Acid 50 MG CAPS Take 1 capsule by mouth daily.    Marland Kitchen amLODipine (NORVASC) 5 MG tablet TAKE 1 TABLET(5 MG) BY MOUTH DAILY 30 tablet 0  . Ascorbic Acid (VITAMIN C) 100 MG tablet Take 100 mg by mouth daily.    . brimonidine (ALPHAGAN) 0.15 % ophthalmic solution Place 1 drop into both eyes 2 (two) times daily.     . Calcium-Magnesium-Vitamin D 400-166.7-133.3 MG-MG-UNIT TABS Take 1 tablet by mouth 2 (two) times daily.    Marland Kitchen co-enzyme Q-10 30 MG capsule Take 1 capsule (30 mg total) by mouth daily. 90 capsule 3  . escitalopram (LEXAPRO) 10 MG tablet Take 1 tablet by mouth daily.  3  . ferrous sulfate 324 (65 FE) MG TBEC Take 1 tablet by mouth every other day.    . fluocinonide cream (LIDEX) 8.41 % Apply 1 application topically 2 (two) times daily.  0  . furosemide (LASIX) 20 MG tablet Take 1 tablet  (20 mg total) by mouth daily. 30 tablet 2  . latanoprost (XALATAN) 0.005 % ophthalmic solution Place 1 drop into both eyes at bedtime.     . Lidocaine (ASPERCREME LIDOCAINE) 4 % PTCH Apply 1 patch topically daily. Apply to base of back/upper right buttock for sciatic pain. Remove patch after 12 hours.    Marland Kitchen losartan (COZAAR) 50 MG tablet Take 50 mg by mouth daily.    . meclizine (ANTIVERT) 25 MG tablet Take 25 mg by mouth 3 (three) times daily as needed for dizziness.    . Melatonin 5 MG TABS Take 1 tablet by mouth at bedtime.     . metoprolol tartrate (LOPRESSOR) 25 MG tablet TAKE 1 TABLET(25 MG) BY MOUTH TWICE DAILY 60 tablet 2  . omeprazole (PRILOSEC) 40 MG capsule TAKE 1 CAPSULE(40 MG) BY MOUTH DAILY 90 capsule 0  . polyethylene glycol (MIRALAX / GLYCOLAX) packet Take 17 g by mouth daily. Mix one tablespoon with 8oz of your favorite juice or water every day until you are having soft formed stools. Then start taking once daily if you didn't have a stool the day before. 30 each 0  . Red Yeast Rice 600 MG CAPS Take 1 capsule by mouth daily.    Marland Kitchen RUTIN PO  Take 500 mg by mouth daily.    . traMADol (ULTRAM) 50 MG tablet TK 1 T PO  BID PRN  0  . Turmeric 500 MG CAPS Take 1 capsule by mouth daily.    Marland Kitchen warfarin (COUMADIN) 3 MG tablet Take 1 tablet (3 mg total) by mouth daily for 1 dose. 30 tablet 2   No facility-administered medications prior to visit.     Review of Systems;  Patient denies headache, fevers, malaise, unintentional weight loss, skin rash, eye pain, sinus congestion and sinus pain, sore throat, dysphagia,  hemoptysis , cough, dyspnea, wheezing, chest pain, palpitations, orthopnea, edema, abdominal pain, nausea, melena, diarrhea, constipation, flank pain, dysuria, hematuria, urinary  Frequency, nocturia, numbness, tingling, seizures,  Focal weakness, Loss of consciousness,  Tremor, insomnia, depression, anxiety, and suicidal ideation.      Objective:  BP (!) 148/74 (BP Location:  Left Arm, Patient Position: Sitting, Cuff Size: Normal)   Pulse 62   Temp (!) 97.4 F (36.3 C) (Oral)   Resp 15   Ht 5\' 7"  (1.702 m)   Wt 139 lb 6.4 oz (63.2 kg)   SpO2 98%   BMI 21.83 kg/m   BP Readings from Last 3 Encounters:  12/11/17 (!) 148/74  11/22/17 (!) 152/79  10/11/17 126/62    Wt Readings from Last 3 Encounters:  12/11/17 139 lb 6.4 oz (63.2 kg)  11/22/17 140 lb (63.5 kg)  10/11/17 138 lb 6.4 oz (62.8 kg)    General appearance: alert, cooperative and appears stated age Ears: normal TM's and external ear canals both ears Throat: lips, mucosa, and tongue normal; teeth and gums normal Neck: no adenopathy, no carotid bruit, supple, symmetrical, trachea midline and thyroid not enlarged, symmetric, no tenderness/mass/nodules Back: severe scoliosis , ROM restricted.  No CVA tenderness. Lungs: clear to auscultation bilaterally Heart: regular rate and rhythm, S1, S2 normal, no murmur, click, rub or gallop Abdomen: soft, non-tender; bowel sounds normal; no masses,  no organomegaly Pulses: 2+ and symmetric Skin: Skin color, texture, turgor normal. No rashes or lesions Lymph nodes: Cervical, supraclavicular, and axillary nodes normal.  No results found for: HGBA1C  Lab Results  Component Value Date   CREATININE 0.87 11/22/2017   CREATININE 0.86 10/11/2017   CREATININE 0.86 06/27/2017    Lab Results  Component Value Date   WBC 4.2 11/22/2017   HGB 11.1 (L) 11/22/2017   HCT 32.5 (L) 11/22/2017   PLT 215 11/22/2017   GLUCOSE 118 (H) 11/22/2017   CHOL 196 01/19/2016   TRIG 67 01/19/2016   HDL 59 01/19/2016   LDLDIRECT 105.9 03/13/2012   LDLCALC 124 01/19/2016   ALT 20 11/22/2017   AST 32 11/22/2017   NA 132 (L) 11/22/2017   K 4.0 11/22/2017   CL 95 (L) 11/22/2017   CREATININE 0.87 11/22/2017   BUN 17 11/22/2017   CO2 29 11/22/2017   TSH 1.56 01/21/2017   INR 2.8 (H) 10/11/2017    Ct Abdomen Pelvis W Contrast  Addendum Date: 11/22/2017   ADDENDUM  REPORT: 11/22/2017 12:59 ADDENDUM: The original report was by Dr. Van Clines. The following addendum is by Dr. Van Clines: I received a call from Dr. Quentin Cornwall an we discussed the status of the appendix. The appendix does not appear inflamed on images 28 through 32 of series 5. The appendix does extend near the inguinal ring, but without an overt Amyand hernia. Electronically Signed   By: Van Clines M.D.   On: 11/22/2017 12:59   Result Date:  11/22/2017 CLINICAL DATA:  Abdominal pain in the right lower quadrant. EXAM: CT ABDOMEN AND PELVIS WITH CONTRAST TECHNIQUE: Multidetector CT imaging of the abdomen and pelvis was performed using the standard protocol following bolus administration of intravenous contrast. CONTRAST:  130mL ISOVUE-300 IOPAMIDOL (ISOVUE-300) INJECTION 61% COMPARISON:  06/12/2017 FINDINGS: Lower chest: Large hiatal hernia involving much of the stomach with organoaxial rotation. Cardiomegaly with pacer leads in place. Oval-shaped homogeneously dense 2.5 by 1.1 cm structure along the neck of the hiatal hernia on image 7/2 could be a venous varix or lymph node. There is passive atelectasis in the right lower lobe related to the hiatal hernia. A small part of the transverse colon extends towards the hiatal hernia to the level of the hiatus. Hepatobiliary: Unremarkable Pancreas: Unremarkable Spleen: Unremarkable Adrenals/Urinary Tract: Adrenal glands normal. Right mid kidney cyst measures up to 7.8 by 6.8 cm on image 22/2 and appears simple. Other renal cysts are present along with small bilateral hypodense renal lesions which are technically too small to characterize but statistically likely to be cysts. Moderately distended/ high-volume urinary bladder. No renal calculi identified. Stomach/Bowel: As noted above there is a large hiatal hernia and part of the transverse colon extends towards the hiatal hernia with a beak like configuration. Prominent stool throughout the colon  favors constipation. Sigmoid colon diverticulosis, no active diverticulitis is identified. Vascular/Lymphatic: Aortoiliac atherosclerotic vascular disease. No pathologic adenopathy is identified. Reproductive: Thick calcifications in the central prostate gland. Other: No supplemental non-categorized findings. Musculoskeletal: Pelvic ventral hernia mesh. Small right indirect inguinal hernia contains adipose tissue and fluid. I do not see definite herniated bowel although there is small bowel near the hernia neck and sometimes herniated bowel can be difficult to distinguish if it is collapsed. Levoconvex thoracolumbar scoliosis with significant rotary component. Grade 1 degenerative anterolisthesis at all levels between L2 and L5. 50% right eccentric compression fracture at the L1 level, similar to that shown on 10/30/2017, without further progressive collapse. Lumbar spondylosis and degenerative disc disease likely causing impingement at L1-2, L2-3, L3-4, and L4-5. Moderate degenerative chondral thinning in both hips. IMPRESSION: 1. Indirect right inguinal hernia contains adipose tissue and a small amount of fluid, and appears to likely extend around the margin of the anterior abdominal wall hernia mesh. No definite herniated bowel in the right groin. 2. Large hiatal hernia containing much of the stomach with some organo-axial rotation but no volvulus. There is an adjacent 2.5 by 1.1 cm homogeneously dense structure along the neck of the hiatal hernia which could be an adjacent lymph node or a venous varix. It part of the transverse colon extends towards the hiatal hernia to the level of the hiatus, but not up into the chest. 3. Thoracolumbar scoliosis with significant rotary component and also spondylosis causing multilevel lumbar impingement. 4. Stable appearance of 50% right eccentric compression fracture of the L1 vertebral body. 5. Other imaging findings of potential clinical significance: Aortic Atherosclerosis  (ICD10-I70.0). Grade 1 degenerative anterolisthesis at all levels between L2 and L5. Moderate degenerative chondral thinning in the hips. Right mid kidney 7.8 cm simple appearing cyst. Prominent stool throughout the colon favors constipation. Sigmoid colon diverticulosis. Electronically Signed: By: Van Clines M.D. On: 11/22/2017 12:44    Assessment & Plan:   Problem List Items Addressed This Visit    Constipation    Improved with daily use of Miralax.       Large hiatal hernia    Recommended eating smaller more frequent meals to avoid post prandial dyspnea  and discomfort.       Sciatica of right side    ESI planned for Jan 11.  Patient advised to suspend coumadin on Jan 7 and have INR checked on Jan 9          A total of 25 minutes was spent with patient more than half of which was spent in counseling patient on the above mentioned issues , reviewing and explaining recent labs and imaging studies done, and coordination of care.  I am having Kevin [name] maintain his brimonidine, latanoprost, vitamin C, ferrous sulfate, co-enzyme Q-10, fluocinonide cream, meclizine, furosemide, Alpha-Lipoic Acid, Calcium-Magnesium-Vitamin D, losartan, Melatonin, Red Yeast Rice, RUTIN PO, Turmeric, Lidocaine, acetaminophen, amLODipine, omeprazole, warfarin, metoprolol tartrate, escitalopram, polyethylene glycol, and traMADol.  No orders of the defined types were placed in this encounter.   There are no discontinued medications.  Follow-up: Return in about 6 months (around 06/10/2018).   Crecencio Mc, MD

## 2017-12-12 DIAGNOSIS — K5900 Constipation, unspecified: Secondary | ICD-10-CM | POA: Insufficient documentation

## 2017-12-12 DIAGNOSIS — K449 Diaphragmatic hernia without obstruction or gangrene: Secondary | ICD-10-CM | POA: Insufficient documentation

## 2017-12-12 NOTE — Assessment & Plan Note (Signed)
Improved with daily use of Miralax.

## 2017-12-12 NOTE — Assessment & Plan Note (Signed)
Recommended eating smaller more frequent meals to avoid post prandial dyspnea and discomfort.

## 2017-12-12 NOTE — Assessment & Plan Note (Signed)
ESI planned for Jan 11.  Patient advised to suspend coumadin on Jan 7 and have INR checked on Jan 9

## 2017-12-20 ENCOUNTER — Other Ambulatory Visit: Payer: Self-pay | Admitting: Internal Medicine

## 2017-12-23 LAB — POCT INR: INR: 1.4 — AB (ref 0.9–1.1)

## 2017-12-25 ENCOUNTER — Telehealth: Payer: Self-pay

## 2017-12-25 NOTE — Telephone Encounter (Signed)
INR is low . If his WARFARIN dose is still 3 mg daily,  Increase to 4 mg daily and repeat iNR in 2 weeks.  He may need A 1 MG DOSE SENT TO pharmacy

## 2017-12-25 NOTE — Telephone Encounter (Signed)
No,  continu the 3 mg daily and recheck in one week

## 2017-12-25 NOTE — Telephone Encounter (Signed)
Received pt's PT/INR results. INR is 1.4. Paper copy has been placed in yellow folder.

## 2017-12-25 NOTE — Telephone Encounter (Signed)
Patient aware and gave verbal understanding  

## 2017-12-25 NOTE — Telephone Encounter (Signed)
Pt stated that he had stopped the warafin for 3 days last week because he had the lumbar injection done. Do you still want the pt to increase to 4mg  daily and rechk in 2 weeks?

## 2018-01-01 ENCOUNTER — Encounter: Payer: Self-pay | Admitting: Internal Medicine

## 2018-01-01 LAB — PROTIME-INR

## 2018-01-03 ENCOUNTER — Telehealth: Payer: Self-pay | Admitting: Internal Medicine

## 2018-01-03 NOTE — Telephone Encounter (Signed)
INR is low at 1.9 on 3 mg daily,  Increase dose to 4 mg every other day alternating with 3 mg .  Recheck in 2 weeks

## 2018-01-07 NOTE — Telephone Encounter (Signed)
Patient advised of below and verbalized understanding.  He will get INR checked at nursing home in 2 weeks.

## 2018-01-07 NOTE — Telephone Encounter (Signed)
LMTCB. Please transfer pt to our office.  

## 2018-01-16 ENCOUNTER — Telehealth: Payer: Self-pay | Admitting: Internal Medicine

## 2018-01-16 NOTE — Telephone Encounter (Signed)
Coumadin level is therapeutic,  Continue current regimen and repeat PT/INR in one month 

## 2018-01-16 NOTE — Telephone Encounter (Signed)
Spoke with pt and informed him that his INR results are therapeutic and he needs to continue taking his current coumadin dose and repeat his INR in one month. The pt gave a verbal understanding.

## 2018-02-06 ENCOUNTER — Encounter: Payer: Self-pay | Admitting: Internal Medicine

## 2018-02-07 ENCOUNTER — Other Ambulatory Visit: Payer: Self-pay | Admitting: Internal Medicine

## 2018-02-07 LAB — PROTIME-INR: INR: 3.2 — AB (ref 0.9–1.1)

## 2018-02-11 ENCOUNTER — Telehealth: Payer: Self-pay | Admitting: Internal Medicine

## 2018-02-11 ENCOUNTER — Telehealth: Payer: Self-pay

## 2018-02-11 ENCOUNTER — Other Ambulatory Visit: Payer: Self-pay | Admitting: Internal Medicine

## 2018-02-11 NOTE — Telephone Encounter (Signed)
Patient is aware 

## 2018-02-11 NOTE — Telephone Encounter (Signed)
Dr. Derrel Nip gave verbals that INR was therapeutic. Patient is aware to continue same dose.

## 2018-02-11 NOTE — Telephone Encounter (Signed)
Coumadin level is therapeutic,  Continue current regimen and repeat PT/INR in one MONTH

## 2018-02-21 ENCOUNTER — Encounter: Payer: Self-pay | Admitting: Family

## 2018-02-21 ENCOUNTER — Ambulatory Visit: Payer: Medicare Other | Admitting: Family

## 2018-02-21 VITALS — BP 138/74 | HR 84 | Temp 97.8°F | Wt 140.8 lb

## 2018-02-21 DIAGNOSIS — R3911 Hesitancy of micturition: Secondary | ICD-10-CM

## 2018-02-21 DIAGNOSIS — R5383 Other fatigue: Secondary | ICD-10-CM

## 2018-02-21 DIAGNOSIS — N401 Enlarged prostate with lower urinary tract symptoms: Secondary | ICD-10-CM

## 2018-02-21 LAB — PSA: PSA: 0.87 ng/mL (ref 0.10–4.00)

## 2018-02-21 LAB — COMPREHENSIVE METABOLIC PANEL
ALK PHOS: 61 U/L (ref 39–117)
ALT: 23 U/L (ref 0–53)
AST: 27 U/L (ref 0–37)
Albumin: 4.7 g/dL (ref 3.5–5.2)
BILIRUBIN TOTAL: 1 mg/dL (ref 0.2–1.2)
BUN: 14 mg/dL (ref 6–23)
CO2: 30 meq/L (ref 19–32)
CREATININE: 0.85 mg/dL (ref 0.40–1.50)
Calcium: 9.8 mg/dL (ref 8.4–10.5)
Chloride: 95 mEq/L — ABNORMAL LOW (ref 96–112)
GFR: 89.86 mL/min (ref >60.00)
GLUCOSE: 111 mg/dL — AB (ref 70–99)
Potassium: 4.4 mEq/L (ref 3.5–5.1)
Sodium: 131 mEq/L — ABNORMAL LOW (ref 135–145)
TOTAL PROTEIN: 6.8 g/dL (ref 6.0–8.3)

## 2018-02-21 LAB — URINALYSIS, ROUTINE W REFLEX MICROSCOPIC
Bilirubin Urine: NEGATIVE
Hgb urine dipstick: NEGATIVE
Ketones, ur: NEGATIVE
Nitrite: NEGATIVE
PH: 6.5 (ref 5.0–8.0)
RBC / HPF: NONE SEEN (ref 0–?)
SPECIFIC GRAVITY, URINE: 1.01 (ref 1.000–1.030)
TOTAL PROTEIN, URINE-UPE24: NEGATIVE
Urine Glucose: NEGATIVE
Urobilinogen, UA: 0.2 (ref 0.0–1.0)

## 2018-02-21 NOTE — Patient Instructions (Signed)
We will await lab and urinary studies.   If normal, I would like you to start a medication such as Flomax to help with urination.    Benign Prostatic Hyperplasia Benign prostatic hyperplasia (BPH) is an enlarged prostate gland that is caused by the normal aging process and not by cancer. The prostate is a walnut-sized gland that is involved in the production of semen. It is located in front of the rectum and below the bladder. The bladder stores urine and the urethra is the tube that carries the urine out of the body. The prostate may get bigger as a man gets older. An enlarged prostate can press on the urethra. This can make it harder to pass urine. The build-up of urine in the bladder can cause infection. Back pressure and infection may progress to bladder damage and kidney (renal) failure. What are the causes? This condition is part of a normal aging process. However, not all men develop problems from this condition. If the prostate enlarges away from the urethra, urine flow will not be blocked. If it enlarges toward the urethra and compresses it, there will be problems passing urine. What increases the risk? This condition is more likely to develop in men over the age of 49 years. What are the signs or symptoms? Symptoms of this condition include:  Getting up often during the night to urinate.  Needing to urinate frequently during the day.  Difficulty starting urine flow.  Decrease in size and strength of your urine stream.  Leaking (dribbling) after urinating.  Inability to pass urine. This needs immediate treatment.  Inability to completely empty your bladder.  Pain when you pass urine. This is more common if there is also an infection.  Urinary tract infection (UTI).  How is this diagnosed? This condition is diagnosed based on your medical history, a physical exam, and your symptoms. Tests will also be done, such as:  A post-void bladder scan. This measures any amount of  urine that may remain in your bladder after you finish urinating.  A digital rectal exam. In a rectal exam, your health care provider checks your prostate by putting a lubricated, gloved finger into your rectum to feel the back of your prostate gland. This exam detects the size of your gland and any abnormal lumps or growths.  An exam of your urine (urinalysis).  A prostate specific antigen (PSA) screening. This is a blood test used to screen for prostate cancer.  An ultrasound. This test uses sound waves to electronically produce a picture of your prostate gland.  Your health care provider may refer you to a specialist in kidney and prostate diseases (urologist). How is this treated? Once symptoms begin, your health care provider will monitor your condition (active surveillance or watchful waiting). Treatment for this condition will depend on the severity of your condition. Treatment may include:  Observation and yearly exams. This may be the only treatment needed if your condition and symptoms are mild.  Medicines to relieve your symptoms, including: ? Medicines to shrink the prostate. ? Medicines to relax the muscle of the prostate.  Surgery in severe cases. Surgery may include: ? Prostatectomy. In this procedure, the prostate tissue is removed completely through an open incision or with a laparascope or robotics. ? Transurethral resection of the prostate (TURP). In this procedure, a tool is inserted through the opening at the tip of the penis (urethra). It is used to cut away tissue of the inner core of the prostate. The  pieces are removed through the same opening of the penis. This removes the blockage. ? Transurethral incision (TUIP). In this procedure, small cuts are made in the prostate. This lessens the prostate's pressure on the urethra. ? Transurethral microwave thermotherapy (TUMT). This procedure uses microwaves to create heat. The heat destroys and removes a small amount of  prostate tissue. ? Transurethral needle ablation (TUNA). This procedure uses radio frequencies to destroy and remove a small amount of prostate tissue. ? Interstitial laser coagulation ([place]). This procedure uses a laser to destroy and remove a small amount of prostate tissue. ? Transurethral electrovaporization (TUVP). This procedure uses electrodes to destroy and remove a small amount of prostate tissue. ? Prostatic urethral lift. This procedure inserts an implant to push the lobes of the prostate away from the urethra.  Follow these instructions at home:  Take over-the-counter and prescription medicines only as told by your health care provider.  Monitor your symptoms for any changes. Contact your health care provider with any changes.  Avoid drinking large amounts of liquid before going to bed or out in public.  Avoid or reduce how much caffeine or alcohol you drink.  Give yourself time when you urinate.  Keep all follow-up visits as told by your health care provider. This is important. Contact a health care provider if:  You have unexplained back pain.  Your symptoms do not get better with treatment.  You develop side effects from the medicine you are taking.  Your urine becomes very dark or has a bad smell.  Your lower abdomen becomes distended and you have trouble passing your urine. Get help right away if:  You have a fever or chills.  You suddenly cannot urinate.  You feel lightheaded, or very dizzy, or you faint.  There are large amounts of blood or clots in the urine.  Your urinary problems become hard to manage.  You develop moderate to severe low back or flank pain. The flank is the side of your body between the ribs and the hip. These symptoms may represent a serious problem that is an emergency. Do not wait to see if the symptoms will go away. Get medical help right away. Call your local emergency services (911 in the [place] Do not drive yourself to the  hospital. Summary  Benign prostatic hyperplasia (BPH) is an enlarged prostate that is caused by the normal aging process and not by cancer.  An enlarged prostate can press on the urethra. This can make it hard to pass urine.  This condition is part of a normal aging process and is more likely to develop in men over the age of 59 years.  Get help right away if you suddenly cannot urinate. This information is not intended to replace advice given to you by your health care provider. Make sure you discuss any questions you have with your health care provider. Document Released: 11/26/2005 Document Revised: 12/31/2016 Document Reviewed: 12/31/2016 Elsevier Interactive Patient Education  [employer].

## 2018-02-21 NOTE — Assessment & Plan Note (Addendum)
Difficulty urinating seems to wax and wane.  Resolved today.  Working diagnosis of BPH based on physical exam, symptoms.  Pending PSA.  If normal PSA, discussed with patient we could trial Flomax after consult with [place] ( patient has complex cardiac history and caution advised with flomax and arrhymias).  He would then follow-up with me or his PCP to see if improvement.  Also pending urine studies.

## 2018-02-21 NOTE — Assessment & Plan Note (Addendum)
Chronic. Worsening. Pending CMP. Discussed likely multifactorial. Particularly absence of exercise and uncontrolled back pain . He has discussed with PCP and cardiology. We agreed that if back pain better controlled ( pending injections next week), walking more may often may help is overall conditioning and fatigue advised he follow up with PCP in 3 months, sooner if symptom is unchanged.

## 2018-02-21 NOTE — Progress Notes (Signed)
Subjective:    Patient ID: Kevin [name], male    DOB: November 26, 1925, 84 y.o.   MRN: [id number]  CC: Kevin [name] is a 84 y.o. male who presents today for an acute visit.    HPI: CC: decreased urine output, hesistancy x 2 weeks, waxing and waning.  2 weeks ago, first time noticed. Seemed normal yesterday and today. Urinated today, yellow. Not orange, no blood in urine.  No pain or burning with urinating or BM.  Last BM today. No fever, stomach pain. No h/o prostate disease per patient.   Notes for past year, feels tired. Seems to be getting worse. Feels tired when walking from bedroom to dining room, 100 ft, and then has to rest. He has discussed this with his pcp and cardiology in the past.  Follows with Dr Clayborn Bigness.. No exercise. Denies exertional chest pain or pressure, numbness or tingling radiating to left arm or jaw, palpitations, dizziness, frequent headaches, changes in vision.  Endorses chronic back pain, at baseline today. Seeing orthopedics next week for low back pain and injection as tramadol hasnt helped.   Lives at [facility]         No CKD   HISTORY:  Past Medical History:  Diagnosis Date  . Atrial fibrillation ([place])   . Cancer ([place])    melanoma- right ear  . Cardiomyopathy, secondary ([place])   . Chronic headaches started age 46  . Congestive heart failure (CHF) ([place])   . Coronary atherosclerosis of native coronary artery   . DJD (degenerative joint disease)   . Essential hypertension, benign   . GERD (gastroesophageal reflux disease)   . Glaucoma   . History of mitral valve replacement with mechanical valve   . Hyperlipidemia, unspecified   . Hypertension   . Lumbago   . Osteoporosis    secondary to low testoerone  . S/P MVR (mitral valve replacement)   . Sciatica   . Scoliosis    Past Surgical History:  Procedure Laterality Date  . CARDIOVERSION  2013  . CATARACT EXTRACTION, BILATERAL     x 2  . CORONARY ARTERY BYPASS GRAFT  12/2014   coronary  artery bypass w/arterial grafts   . HERNIA REPAIR     x 2  . MELANOMA EXCISION     right ear  . MITRAL VALVE REPLACEMENT  1994   [facility]  . NASAL SEPTUM SURGERY  1963  . PERMANENT PACEMAKER INSERTION Left 12/22/2014   Family History  Problem Relation Age of Onset  . Cancer Father        unknown  . Alzheimer's disease Brother   . Arrhythmia Brother   . Mental illness Mother     Allergies: Cymbalta [duloxetine hcl] and Aspirin Current Outpatient Medications on File Prior to Visit  Medication Sig Dispense Refill  . acetaminophen (TYLENOL) 325 MG tablet Take 650 mg by mouth every 4 (four) hours as needed for fever. Maximum dose for 24 hours is 3000 mg from all sources of Acetaminophen/tylenol    . Alpha-Lipoic Acid 50 MG CAPS Take 1 capsule by mouth daily.    Marland Kitchen amLODipine (NORVASC) 5 MG tablet TAKE 1 TABLET(5 MG) BY MOUTH DAILY 30 tablet 5  . Ascorbic Acid (VITAMIN C) 100 MG tablet Take 100 mg by mouth daily.    . brimonidine (ALPHAGAN) 0.15 % ophthalmic solution Place 1 drop into both eyes 2 (two) times daily.     . Calcium-Magnesium-Vitamin D 400-166.7-133.3 MG-MG-UNIT TABS Take 1 tablet by mouth  2 (two) times daily.    Marland Kitchen co-enzyme Q-10 30 MG capsule Take 1 capsule (30 mg total) by mouth daily. 90 capsule 3  . escitalopram (LEXAPRO) 10 MG tablet Take 1 tablet by mouth daily.  3  . ferrous sulfate 324 (65 FE) MG TBEC Take 1 tablet by mouth every other day.    . fluocinonide cream (LIDEX) 4.31 % Apply 1 application topically 2 (two) times daily.  0  . furosemide (LASIX) 20 MG tablet Take 1 tablet (20 mg total) by mouth daily. 30 tablet 2  . latanoprost (XALATAN) 0.005 % ophthalmic solution Place 1 drop into both eyes at bedtime.     . Lidocaine (ASPERCREME LIDOCAINE) 4 % PTCH Apply 1 patch topically daily. Apply to base of back/upper right buttock for sciatic pain. Remove patch after 12 hours.    Marland Kitchen losartan (COZAAR) 50 MG tablet Take 50 mg by mouth daily.    . meclizine (ANTIVERT) 25  MG tablet Take 25 mg by mouth 3 (three) times daily as needed for dizziness.    . Melatonin 5 MG TABS Take 1 tablet by mouth at bedtime.     . metoprolol tartrate (LOPRESSOR) 25 MG tablet TAKE 1 TABLET(25 MG) BY MOUTH TWICE DAILY 60 tablet 5  . omeprazole (PRILOSEC) 40 MG capsule TAKE 1 CAPSULE(40 MG) BY MOUTH DAILY 90 capsule 0  . polyethylene glycol (MIRALAX / GLYCOLAX) packet Take 17 g by mouth daily. Mix one tablespoon with 8oz of your favorite juice or water every day until you are having soft formed stools. Then start taking once daily if you didn't have a stool the day before. 30 each 0  . Red Yeast Rice 600 MG CAPS Take 1 capsule by mouth daily.    Marland Kitchen RUTIN PO Take 500 mg by mouth daily.    . traMADol (ULTRAM) 50 MG tablet TK 1 T PO  BID PRN  0  . Turmeric 500 MG CAPS Take 1 capsule by mouth daily.    Marland Kitchen warfarin (COUMADIN) 3 MG tablet TAKE 1 TABLET(3 MG) BY MOUTH DAILY FOR 1 DOSE 30 tablet 2  . [DISCONTINUED] amitriptyline (ELAVIL) 25 MG tablet Take 1 tablet (25 mg total) by mouth at bedtime. 30 tablet 2   No current facility-administered medications on file prior to visit.     Social History   Tobacco Use  . Smoking status: Never Smoker  . Smokeless tobacco: Never Used  Substance Use Topics  . Alcohol use: No  . Drug use: No    Review of Systems  Constitutional: Negative for chills and fever.  Respiratory: Negative for cough.   Cardiovascular: Negative for chest pain and palpitations.  Gastrointestinal: Negative for nausea and vomiting.  Genitourinary: Positive for difficulty urinating. Negative for frequency, hematuria and urgency.      Objective:    BP 138/74 (BP Location: Left Arm, Patient Position: Sitting, Cuff Size: Normal)   Pulse 84   Temp 97.8 F (36.6 C) (Oral)   Wt 140 lb 12.8 oz (63.9 kg)   SpO2 93%   BMI 22.05 kg/m    Physical Exam  Constitutional: He appears well-developed and well-nourished.  Cardiovascular: Regular rhythm and normal heart sounds.   Pulmonary/Chest: Effort normal and breath sounds normal. No respiratory distress. He has no wheezes. He has no rhonchi. He has no rales.  Genitourinary: Rectal exam shows no external hemorrhoid and no internal hemorrhoid. Prostate is enlarged. Prostate is not tender.  Genitourinary Comments: Symmetrically enlarged, no masses appreciated, .  Non tender  Neurological: He is alert.  Skin: Skin is warm and dry.  Psychiatric: He has a normal mood and affect. His speech is normal and behavior is normal.  Vitals reviewed.      Assessment & Plan:   Problem List Items Addressed This Visit      Genitourinary   Benign prostatic hyperplasia with urinary hesitancy - Primary    Difficulty urinating seems to wax and wane.  Resolved today.  Working diagnosis of BPH based on physical exam, symptoms.  Pending PSA.  If normal PSA, discussed with patient we could trial Flomax after consult with [place] ( patient has complex cardiac history and caution advised with flomax and arrhymias).  He would then follow-up with me or his PCP to see if improvement.  Also pending urine studies.      Relevant Orders   Comprehensive metabolic panel   PSA   Urinalysis, Routine w reflex microscopic   CULTURE, URINE COMPREHENSIVE     Other   Fatigue    Chronic. Worsening. Pending CMP. Discussed likely multifactorial. Particularly absence of exercise and uncontrolled back pain . He has discussed with PCP and cardiology. We agreed that if back pain better controlled ( pending injections next week), walking more may often may help is overall conditioning and fatigue advised he follow up with PCP in 3 months, sooner if symptom is unchanged.            I am having Orland Mustard maintain his brimonidine, latanoprost, vitamin C, ferrous sulfate, co-enzyme Q-10, fluocinonide cream, meclizine, furosemide, Alpha-Lipoic Acid, Calcium-Magnesium-Vitamin D, losartan, Melatonin, Red Yeast Rice, RUTIN PO, Turmeric, Lidocaine,  acetaminophen, escitalopram, polyethylene glycol, traMADol, amLODipine, warfarin, omeprazole, and metoprolol tartrate.   No orders of the defined types were placed in this encounter.   Return precautions given.   Risks, benefits, and alternatives of the medications and treatment plan prescribed today were discussed, and patient expressed understanding.   Education regarding symptom management and diagnosis given to patient on AVS.  Continue to follow with Crecencio Mc, MD for routine health maintenance.   Orland Mustard and I agreed with plan.   Mable Paris, FNP

## 2018-02-22 ENCOUNTER — Other Ambulatory Visit: Payer: Self-pay | Admitting: Family

## 2018-02-22 ENCOUNTER — Telehealth: Payer: Self-pay | Admitting: Family

## 2018-02-22 ENCOUNTER — Telehealth: Payer: Self-pay | Admitting: Internal Medicine

## 2018-02-22 DIAGNOSIS — I10 Essential (primary) hypertension: Secondary | ICD-10-CM

## 2018-02-22 DIAGNOSIS — D508 Other iron deficiency anemias: Secondary | ICD-10-CM

## 2018-02-22 DIAGNOSIS — E7800 Pure hypercholesterolemia, unspecified: Secondary | ICD-10-CM

## 2018-02-22 DIAGNOSIS — N399 Disorder of urinary system, unspecified: Secondary | ICD-10-CM

## 2018-02-22 DIAGNOSIS — E559 Vitamin D deficiency, unspecified: Secondary | ICD-10-CM

## 2018-02-22 DIAGNOSIS — E871 Hypo-osmolality and hyponatremia: Secondary | ICD-10-CM

## 2018-02-22 MED ORDER — TAMSULOSIN HCL 0.4 MG PO CAPS: 0.4000 mg | ORAL_CAPSULE | Freq: Every day | ORAL | 0 refills | Status: DC

## 2018-02-22 NOTE — Progress Notes (Signed)
close

## 2018-02-22 NOTE — Telephone Encounter (Signed)
Pt called in to Saturday clinic regarding needing to schedule appt. Pt needs an order for fasting labs pt is scheduled to come in for appt on 03/06/18. Please advise? Thank you!

## 2018-02-23 LAB — CULTURE, URINE COMPREHENSIVE
MICRO NUMBER:: 90331245
RESULT:: NO GROWTH
SPECIMEN QUALITY:: ADEQUATE

## 2018-02-25 NOTE — Telephone Encounter (Signed)
FASTING LABS ORDERED.  

## 2018-02-25 NOTE — Telephone Encounter (Signed)
Not sure of what labs he may need before his appt on 03/06/2018.

## 2018-02-25 NOTE — Addendum Note (Signed)
Addended by: Crecencio Mc on: 02/25/2018 01:50 PM   Modules accepted: Orders

## 2018-02-27 ENCOUNTER — Telehealth: Payer: Self-pay

## 2018-02-27 NOTE — Telephone Encounter (Signed)
Spoke with pt and informed him of his INR results and to continue current regimen. Pt gave a verbal order. Pt wanted to let you know that he went to Dr. Sharlet Salina yesterday and they would not give him the injection because his INR was too. Pt stated that Dr. Sharlet Salina likes the INR around 3.0. Pt has another appt with Dr. Sharlet Salina on Tuesday.

## 2018-02-27 NOTE — Telephone Encounter (Signed)
Received pt's INR result...3.4  Result has been placed in yellow result folder.

## 2018-02-27 NOTE — Telephone Encounter (Signed)
Spoke with pt and informed him of that he should stop his coumadin 3 days prior to his injection which would mean his last dose would me tomorrow night and not to take it again until the day after injection which would be Wednesday. Pt repeated all instructions back with understanding.

## 2018-02-27 NOTE — Telephone Encounter (Signed)
He should let me know when he is going to have an injection becauase Dr Sharlet Salina does not .  He should suspend his coumadin for  3 days prior to any ESI, so if his next injection is Tuesday his last dose should be Friday night

## 2018-02-27 NOTE — Telephone Encounter (Signed)
Coumadin level is therapeutic,  Continue current regimen and repeat PT/INR in one month 

## 2018-02-28 ENCOUNTER — Other Ambulatory Visit: Payer: Self-pay | Admitting: Internal Medicine

## 2018-03-05 NOTE — Telephone Encounter (Signed)
I do not know how to interpret this since he is stopping and starting his coumadin for his epidural injections.  Has he resuemd it?

## 2018-03-06 ENCOUNTER — Ambulatory Visit (INDEPENDENT_AMBULATORY_CARE_PROVIDER_SITE_OTHER): Payer: Medicare Other | Admitting: Internal Medicine

## 2018-03-06 ENCOUNTER — Encounter: Payer: Self-pay | Admitting: Internal Medicine

## 2018-03-06 DIAGNOSIS — E871 Hypo-osmolality and hyponatremia: Secondary | ICD-10-CM

## 2018-03-06 DIAGNOSIS — E559 Vitamin D deficiency, unspecified: Secondary | ICD-10-CM

## 2018-03-06 DIAGNOSIS — I10 Essential (primary) hypertension: Secondary | ICD-10-CM

## 2018-03-06 DIAGNOSIS — E7800 Pure hypercholesterolemia, unspecified: Secondary | ICD-10-CM | POA: Diagnosis not present

## 2018-03-06 DIAGNOSIS — D508 Other iron deficiency anemias: Secondary | ICD-10-CM

## 2018-03-06 DIAGNOSIS — N179 Acute kidney failure, unspecified: Secondary | ICD-10-CM | POA: Diagnosis not present

## 2018-03-06 LAB — COMPREHENSIVE METABOLIC PANEL
ALT: 22 U/L (ref 0–53)
AST: 27 U/L (ref 0–37)
Albumin: 4.2 g/dL (ref 3.5–5.2)
Alkaline Phosphatase: 55 U/L (ref 39–117)
BUN: 20 mg/dL (ref 6–23)
CHLORIDE: 97 meq/L (ref 96–112)
CO2: 29 meq/L (ref 19–32)
Calcium: 9.5 mg/dL (ref 8.4–10.5)
Creatinine, Ser: 0.93 mg/dL (ref 0.40–1.50)
GFR: 81 mL/min (ref >60.00)
Glucose, Bld: 102 mg/dL — ABNORMAL HIGH (ref 70–99)
POTASSIUM: 4.1 meq/L (ref 3.5–5.1)
SODIUM: 132 meq/L — AB (ref 135–145)
Total Bilirubin: 0.8 mg/dL (ref 0.2–1.2)
Total Protein: 6.7 g/dL (ref 6.0–8.3)

## 2018-03-06 LAB — CBC WITH DIFFERENTIAL/PLATELET
BASOS ABS: 0 10*3/uL (ref 0.0–0.1)
Basophils Relative: 0.6 % (ref 0.0–3.0)
EOS ABS: 0.2 10*3/uL (ref 0.0–0.7)
Eosinophils Relative: 4.2 % (ref 0.0–5.0)
HCT: 33.2 % — ABNORMAL LOW (ref 39.0–52.0)
Hemoglobin: 11.5 g/dL — ABNORMAL LOW (ref 13.0–17.0)
LYMPHS ABS: 1.1 10*3/uL (ref 0.7–4.0)
Lymphocytes Relative: 22.7 % (ref 12.0–46.0)
MCHC: 34.6 g/dL (ref 30.0–36.0)
MCV: 99.2 fl (ref 78.0–100.0)
MONO ABS: 0.6 10*3/uL (ref 0.1–1.0)
MONOS PCT: 11.3 % (ref 3.0–12.0)
NEUTROS PCT: 61.2 % (ref 43.0–77.0)
Neutro Abs: 3.1 10*3/uL (ref 1.4–7.7)
Platelets: 209 10*3/uL (ref 150.0–400.0)
RBC: 3.35 Mil/uL — ABNORMAL LOW (ref 4.22–5.81)
RDW: 12.8 % (ref 11.5–15.5)
WBC: 5 10*3/uL (ref 4.0–10.5)

## 2018-03-06 LAB — LIPID PANEL
CHOL/HDL RATIO: 3
Cholesterol: 187 mg/dL (ref 0–200)
HDL: 66.4 mg/dL (ref >39.00)
LDL CALC: 104 mg/dL — AB (ref 0–99)
NonHDL: 120.65
Triglycerides: 81 mg/dL (ref 0.0–149.0)
VLDL: 16.2 mg/dL (ref 0.0–40.0)

## 2018-03-06 LAB — TSH: TSH: 3.14 u[IU]/mL (ref 0.35–4.50)

## 2018-03-06 LAB — VITAMIN D 25 HYDROXY (VIT D DEFICIENCY, FRACTURES): VITD: 58.94 ng/mL (ref 30.00–100.00)

## 2018-03-06 MED ORDER — LOSARTAN POTASSIUM 100 MG PO TABS: 100.0000 mg | ORAL_TABLET | Freq: Every day | ORAL | 0 refills | Status: DC

## 2018-03-06 MED ORDER — DOCUSATE SODIUM 100 MG PO CAPS: 200.0000 mg | ORAL_CAPSULE | Freq: Every day | ORAL | 5 refills | Status: AC

## 2018-03-06 NOTE — Telephone Encounter (Signed)
Pt is in the office today and will discuss this with Dr. Derrel Nip during visit.

## 2018-03-06 NOTE — Progress Notes (Signed)
Subjective:  Patient ID: Kevin [name], male    DOB: 1925-07-26  Age: 84 y.o. MRN: [id number]  CC: Diagnoses of Other iron deficiency anemia, Vitamin D deficiency, Essential hypertension, Pure hypercholesterolemia, Hyponatremia, Acute renal failure, unspecified acute renal failure type ([place]), and Benign essential HTN were pertinent to this visit.  HPI TYAIRE ODEM presents for follow up   on hyponatremia noticed by margaret two weeks ago.   Resumed coumadin on Tuesday night, the night of the ESI (2nd one)  March 26   4 mg alt with 3 mg .     Back pain still present. Feels weak,  no relief of pain yet.   did not  sleep well that night due to pain  Last night was better,  Uses melatonin   Sees [facility] in one month . Using tramadol in the morning only,  Tylenol later on in the day.     .   Lab Results  Component Value Date   INR 3.2 (A) 02/06/2018   INR 1.4 (A) 12/23/2017   INR 2.8 (H) 10/11/2017   PROTIME 31.2 (A) 04/17/2016   PROTIME 33.7 (A) 08/03/2015   PROTIME 34.4 (A) 07/06/2015     Outpatient Medications Prior to Visit  Medication Sig Dispense Refill  . acetaminophen (TYLENOL) 325 MG tablet Take 650 mg by mouth every 4 (four) hours as needed for fever. Maximum dose for 24 hours is 3000 mg from all sources of Acetaminophen/tylenol    . Alpha-Lipoic Acid 50 MG CAPS Take 1 capsule by mouth daily.    Marland Kitchen amLODipine (NORVASC) 5 MG tablet TAKE 1 TABLET(5 MG) BY MOUTH DAILY 30 tablet 5  . Ascorbic Acid (VITAMIN C) 100 MG tablet Take 100 mg by mouth daily.    . brimonidine (ALPHAGAN) 0.15 % ophthalmic solution Place 1 drop into both eyes 2 (two) times daily.     . Calcium-Magnesium-Vitamin D 400-166.7-133.3 MG-MG-UNIT TABS Take 1 tablet by mouth 2 (two) times daily.    Marland Kitchen co-enzyme Q-10 30 MG capsule Take 1 capsule (30 mg total) by mouth daily. 90 capsule 3  . escitalopram (LEXAPRO) 10 MG tablet Take 1 tablet by mouth daily.  3  . ferrous sulfate 324 (65 FE) MG TBEC Take 1  tablet by mouth every other day.    . fluocinonide cream (LIDEX) 9.74 % Apply 1 application topically 2 (two) times daily.  0  . furosemide (LASIX) 20 MG tablet Take 1 tablet (20 mg total) by mouth daily. 30 tablet 2  . latanoprost (XALATAN) 0.005 % ophthalmic solution Place 1 drop into both eyes at bedtime.     . Lidocaine (ASPERCREME LIDOCAINE) 4 % PTCH Apply 1 patch topically daily. Apply to base of back/upper right buttock for sciatic pain. Remove patch after 12 hours.    . meclizine (ANTIVERT) 25 MG tablet Take 25 mg by mouth 3 (three) times daily as needed for dizziness.    . Melatonin 5 MG TABS Take 1 tablet by mouth at bedtime.     . metoprolol tartrate (LOPRESSOR) 25 MG tablet TAKE 1 TABLET(25 MG) BY MOUTH TWICE DAILY 60 tablet 5  . omeprazole (PRILOSEC) 40 MG capsule TAKE 1 CAPSULE(40 MG) BY MOUTH DAILY 90 capsule 0  . polyethylene glycol (MIRALAX / GLYCOLAX) packet Take 17 g by mouth daily. Mix one tablespoon with 8oz of your favorite juice or water every day until you are having soft formed stools. Then start taking once daily if you didn't have  a stool the day before. 30 each 0  . Red Yeast Rice 600 MG CAPS Take 1 capsule by mouth daily.    Marland Kitchen RUTIN PO Take 500 mg by mouth daily.    . tamsulosin (FLOMAX) 0.4 MG CAPS capsule TAKE 1 CAPSULE(0.4 MG) BY MOUTH DAILY 90 capsule 0  . traMADol (ULTRAM) 50 MG tablet TK 1 T PO  BID PRN  0  . Turmeric 500 MG CAPS Take 1 capsule by mouth daily.    Marland Kitchen warfarin (COUMADIN) 3 MG tablet TAKE 1 TABLET(3 MG) BY MOUTH DAILY FOR 1 DOSE 30 tablet 2  . losartan (COZAAR) 100 MG tablet TAKE 1 TABLET BY MOUTH EVERY DAY 90 tablet 0  . losartan (COZAAR) 50 MG tablet Take 50 mg by mouth daily.     No facility-administered medications prior to visit.     Review of Systems;  Patient denies headache, fevers, malaise, unintentional weight loss, skin rash, eye pain, sinus congestion and sinus pain, sore throat, dysphagia,  hemoptysis , cough, dyspnea, wheezing,  chest pain, palpitations, orthopnea, edema, abdominal pain, nausea, melena, diarrhea, constipation, flank pain, dysuria, hematuria, urinary  Frequency, nocturia, numbness, tingling, seizures,  Focal weakness, Loss of consciousness,  Tremor, insomnia, depression, anxiety, and suicidal ideation.      Objective:  BP 134/62 (BP Location: Left Arm, Patient Position: Sitting, Cuff Size: Normal)   Pulse 81   Temp (!) 97.5 F (36.4 C) (Oral)   Resp 16   Ht 5\' 7"  (1.702 m)   Wt 139 lb 12.8 oz (63.4 kg)   SpO2 95%   BMI 21.90 kg/m   BP Readings from Last 3 Encounters:  03/06/18 134/62  02/21/18 138/74  12/11/17 (!) 148/74    Wt Readings from Last 3 Encounters:  03/06/18 139 lb 12.8 oz (63.4 kg)  02/21/18 140 lb 12.8 oz (63.9 kg)  12/11/17 139 lb 6.4 oz (63.2 kg)    General appearance: alert, cooperative and appears stated age Ears: normal TM's and external ear canals both ears Throat: lips, mucosa, and tongue normal; teeth and gums normal Neck: no adenopathy, no carotid bruit, supple, symmetrical, trachea midline and thyroid not enlarged, symmetric, no tenderness/mass/nodules Back: symmetric, no curvature. ROM normal. No CVA tenderness. Lungs: clear to auscultation bilaterally Heart: regular rate and rhythm, S1, S2 normal, no murmur, click, rub or gallop Abdomen: soft, non-tender; bowel sounds normal; no masses,  no organomegaly Pulses: 2+ and symmetric Skin: Skin color, texture, turgor normal. No rashes or lesions Lymph nodes: Cervical, supraclavicular, and axillary nodes normal.  No results found for: HGBA1C  Lab Results  Component Value Date   CREATININE 0.93 03/06/2018   CREATININE 0.85 02/21/2018   CREATININE 0.87 11/22/2017    Lab Results  Component Value Date   WBC 5.0 03/06/2018   HGB 11.5 (L) 03/06/2018   HCT 33.2 (L) 03/06/2018   PLT 209.0 03/06/2018   GLUCOSE 102 (H) 03/06/2018   CHOL 187 03/06/2018   TRIG 81.0 03/06/2018   HDL 66.40 03/06/2018   LDLDIRECT  105.9 03/13/2012   LDLCALC 104 (H) 03/06/2018   ALT 22 03/06/2018   AST 27 03/06/2018   NA 132 (L) 03/06/2018   K 4.1 03/06/2018   CL 97 03/06/2018   CREATININE 0.93 03/06/2018   BUN 20 03/06/2018   CO2 29 03/06/2018   TSH 3.14 03/06/2018   PSA 0.87 02/21/2018   INR 3.2 (A) 02/06/2018    Ct Abdomen Pelvis W Contrast  Addendum Date: 11/22/2017   ADDENDUM REPORT:  11/22/2017 12:59 ADDENDUM: The original report was by Dr. Van Clines. The following addendum is by Dr. Van Clines: I received a call from Dr. Quentin Cornwall an we discussed the status of the appendix. The appendix does not appear inflamed on images 28 through 32 of series 5. The appendix does extend near the inguinal ring, but without an overt Amyand hernia. Electronically Signed   By: Van Clines M.D.   On: 11/22/2017 12:59   Result Date: 11/22/2017 CLINICAL DATA:  Abdominal pain in the right lower quadrant. EXAM: CT ABDOMEN AND PELVIS WITH CONTRAST TECHNIQUE: Multidetector CT imaging of the abdomen and pelvis was performed using the standard protocol following bolus administration of intravenous contrast. CONTRAST:  15mL ISOVUE-300 IOPAMIDOL (ISOVUE-300) INJECTION 61% COMPARISON:  06/12/2017 FINDINGS: Lower chest: Large hiatal hernia involving much of the stomach with organoaxial rotation. Cardiomegaly with pacer leads in place. Oval-shaped homogeneously dense 2.5 by 1.1 cm structure along the neck of the hiatal hernia on image 7/2 could be a venous varix or lymph node. There is passive atelectasis in the right lower lobe related to the hiatal hernia. A small part of the transverse colon extends towards the hiatal hernia to the level of the hiatus. Hepatobiliary: Unremarkable Pancreas: Unremarkable Spleen: Unremarkable Adrenals/Urinary Tract: Adrenal glands normal. Right mid kidney cyst measures up to 7.8 by 6.8 cm on image 22/2 and appears simple. Other renal cysts are present along with small bilateral hypodense renal  lesions which are technically too small to characterize but statistically likely to be cysts. Moderately distended/ high-volume urinary bladder. No renal calculi identified. Stomach/Bowel: As noted above there is a large hiatal hernia and part of the transverse colon extends towards the hiatal hernia with a beak like configuration. Prominent stool throughout the colon favors constipation. Sigmoid colon diverticulosis, no active diverticulitis is identified. Vascular/Lymphatic: Aortoiliac atherosclerotic vascular disease. No pathologic adenopathy is identified. Reproductive: Thick calcifications in the central prostate gland. Other: No supplemental non-categorized findings. Musculoskeletal: Pelvic ventral hernia mesh. Small right indirect inguinal hernia contains adipose tissue and fluid. I do not see definite herniated bowel although there is small bowel near the hernia neck and sometimes herniated bowel can be difficult to distinguish if it is collapsed. Levoconvex thoracolumbar scoliosis with significant rotary component. Grade 1 degenerative anterolisthesis at all levels between L2 and L5. 50% right eccentric compression fracture at the L1 level, similar to that shown on 10/30/2017, without further progressive collapse. Lumbar spondylosis and degenerative disc disease likely causing impingement at L1-2, L2-3, L3-4, and L4-5. Moderate degenerative chondral thinning in both hips. IMPRESSION: 1. Indirect right inguinal hernia contains adipose tissue and a small amount of fluid, and appears to likely extend around the margin of the anterior abdominal wall hernia mesh. No definite herniated bowel in the right groin. 2. Large hiatal hernia containing much of the stomach with some organo-axial rotation but no volvulus. There is an adjacent 2.5 by 1.1 cm homogeneously dense structure along the neck of the hiatal hernia which could be an adjacent lymph node or a venous varix. It part of the transverse colon extends  towards the hiatal hernia to the level of the hiatus, but not up into the chest. 3. Thoracolumbar scoliosis with significant rotary component and also spondylosis causing multilevel lumbar impingement. 4. Stable appearance of 50% right eccentric compression fracture of the L1 vertebral body. 5. Other imaging findings of potential clinical significance: Aortic Atherosclerosis (ICD10-I70.0). Grade 1 degenerative anterolisthesis at all levels between L2 and L5. Moderate degenerative chondral thinning in  the hips. Right mid kidney 7.8 cm simple appearing cyst. Prominent stool throughout the colon favors constipation. Sigmoid colon diverticulosis. Electronically Signed: By: Van Clines M.D. On: 11/22/2017 12:44    Assessment & Plan:   Problem List Items Addressed This Visit    Hypertension   Relevant Medications   losartan (COZAAR) 100 MG tablet   Hyperlipidemia   Relevant Medications   losartan (COZAAR) 100 MG tablet   Vitamin D deficiency   Hyponatremia    Mild,  unchanged by repeat labs.SPEP is pending, and urine IFE  Is normal. No further workup given age.   Lab Results  Component Value Date   NA 132 (L) 03/06/2018   K 4.1 03/06/2018   CL 97 03/06/2018   CO2 29 03/06/2018         Relevant Orders   Protein Electrophoresis, (serum)   Benign essential HTN    Well controlled on current regimen. Renal function stable, no changes today.  Lab Results  Component Value Date   CREATININE 0.93 03/06/2018   Lab Results  Component Value Date   NA 132 (L) 03/06/2018   K 4.1 03/06/2018   CL 97 03/06/2018   CO2 29 03/06/2018         Relevant Medications   losartan (COZAAR) 100 MG tablet   Anemia, iron deficiency    He had a drop in hgb during recent hospitalization for dehdyration and INR of 8.  Repeat hgb has improved, and iron stores are normal.   Lab Results  Component Value Date   WBC 5.0 03/06/2018   HGB 11.5 (L) 03/06/2018   HCT 33.2 (L) 03/06/2018   MCV 99.2  03/06/2018   PLT 209.0 03/06/2018   Lab Results  Component Value Date   IRON 106 10/11/2017   TIBC 360 10/11/2017   FERRITIN 36 10/11/2017   Lab Results  Component Value Date   POEUMPNT61 443 10/11/2017         Acute renal failure (ARF) ([place])    Resolved by repeat testing   Lab Results  Component Value Date   CREATININE 0.93 03/06/2018          A total of 25 minutes of face to face time was spent with patient more than half of which was spent in counselling about the above mentioned conditions  and coordination of care   I am having Orland Mustard start on docusate sodium. I am also having him maintain his brimonidine, latanoprost, vitamin C, ferrous sulfate, co-enzyme Q-10, fluocinonide cream, meclizine, furosemide, Alpha-Lipoic Acid, Calcium-Magnesium-Vitamin D, Melatonin, Red Yeast Rice, RUTIN PO, Turmeric, Lidocaine, acetaminophen, escitalopram, polyethylene glycol, traMADol, amLODipine, warfarin, omeprazole, metoprolol tartrate, tamsulosin, and losartan.  Meds ordered this encounter  Medications  . DISCONTD: losartan (COZAAR) 100 MG tablet    Sig: Take 1 tablet (100 mg total) by mouth daily.    Dispense:  90 tablet    Refill:  0    PATIENT STATES THAT HE HAS NOT HEARD FROM YOU ON REFILL SENT IN ON Monday  . losartan (COZAAR) 100 MG tablet    Sig: Take 1 tablet (100 mg total) by mouth daily.    Dispense:  90 tablet    Refill:  0    PATIENT STATES THAT HE HAS NOT HEARD FROM YOU ON REFILL SENT IN ON Monday  . docusate sodium (COLACE) 100 MG capsule    Sig: Take 2 capsules (200 mg total) by mouth at bedtime.    Dispense:  60 capsule  Refill:  5    Medications Discontinued During This Encounter  Medication Reason  . losartan (COZAAR) 50 MG tablet   . losartan (COZAAR) 100 MG tablet Reorder  . losartan (COZAAR) 100 MG tablet Reorder    Follow-up: No follow-ups on file.   Crecencio Mc, MD

## 2018-03-06 NOTE — Patient Instructions (Addendum)
You should have your coumadin  rechecked next Tuesday  April 2   yOU CAN ADD cOLACE UP TO 200 MG AT BEDTIE TO SOFTEN STOOLS AND EASE YOUR CONSTIPATOIN  YOU CAN TAKE  THI S  MEDICATION ALONG WITH THE MIRALAX

## 2018-03-07 LAB — IFE AND PE, RANDOM URINE
% BETA, Urine: 0 %
ALBUMIN, U: 0 %
ALPHA 1 URINE: 0 %
ALPHA-2-GLOBULIN, U: 0 %
GAMMA GLOBULIN URINE: 0 %

## 2018-03-08 DIAGNOSIS — E871 Hypo-osmolality and hyponatremia: Secondary | ICD-10-CM | POA: Insufficient documentation

## 2018-03-08 NOTE — Assessment & Plan Note (Signed)
Resolved by repeat testing   Lab Results  Component Value Date   CREATININE 0.93 03/06/2018

## 2018-03-08 NOTE — Assessment & Plan Note (Signed)
He had a drop in hgb during recent hospitalization for dehdyration and INR of 8.  Repeat hgb has improved, and iron stores are normal.   Lab Results  Component Value Date   WBC 5.0 03/06/2018   HGB 11.5 (L) 03/06/2018   HCT 33.2 (L) 03/06/2018   MCV 99.2 03/06/2018   PLT 209.0 03/06/2018   Lab Results  Component Value Date   IRON 106 10/11/2017   TIBC 360 10/11/2017   FERRITIN 36 10/11/2017   Lab Results  Component Value Date   VITAMINB12 749 10/11/2017

## 2018-03-08 NOTE — Assessment & Plan Note (Addendum)
Mild,  unchanged by repeat labs.SPEP is pending, and urine IFE  Is normal. No further workup given age.   Lab Results  Component Value Date   NA 132 (L) 03/06/2018   K 4.1 03/06/2018   CL 97 03/06/2018   CO2 29 03/06/2018

## 2018-03-08 NOTE — Assessment & Plan Note (Signed)
Well controlled on current regimen. Renal function stable, no changes today.  Lab Results  Component Value Date   CREATININE 0.93 03/06/2018   Lab Results  Component Value Date   NA 132 (L) 03/06/2018   K 4.1 03/06/2018   CL 97 03/06/2018   CO2 29 03/06/2018

## 2018-03-15 IMAGING — CT CT ABD-PELV W/O CM
2 of 4 series · 15 of 46 positions shown, 17 images · non-contrast
Comparison: Chest CT 06/04/2012

CLINICAL DATA: 3-4 days with nausea and vomiting. 2-3 episodes of
vomiting each day.

EXAM:
CT ABDOMEN AND PELVIS WITHOUT CONTRAST
TECHNIQUE: Multidetector CT imaging of the abdomen and pelvis was performed
following the standard protocol without IV contrast.

[Series 2: routine abd/pel wo · axial · 0.72mm/px · z∈[-625,-230]mm · 12 of 87 slices shown, 14 images]
[im 4/87  soft-tissue]
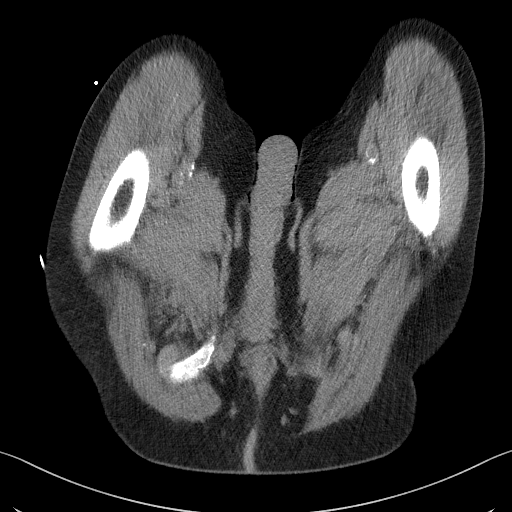
[im 4/87  bone]
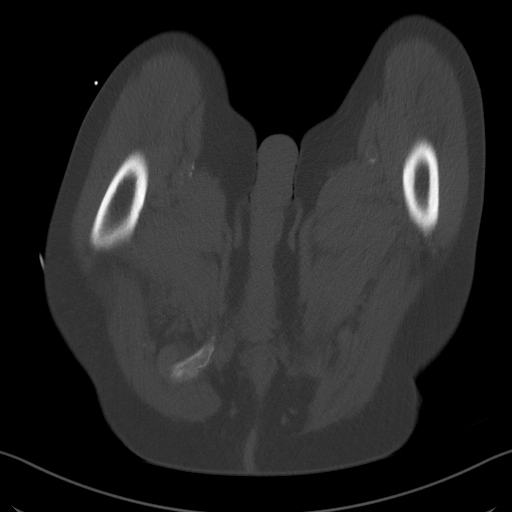
[im 11/87  soft-tissue]
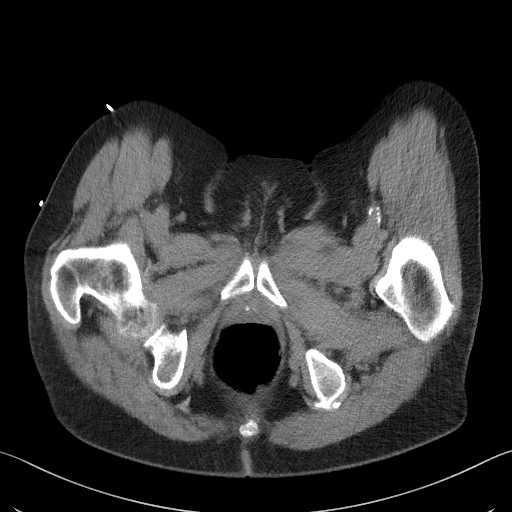
[im 18/87  soft-tissue]
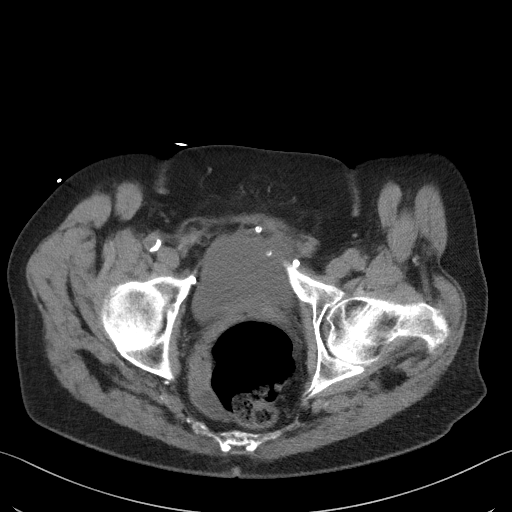
[im 26/87  soft-tissue]
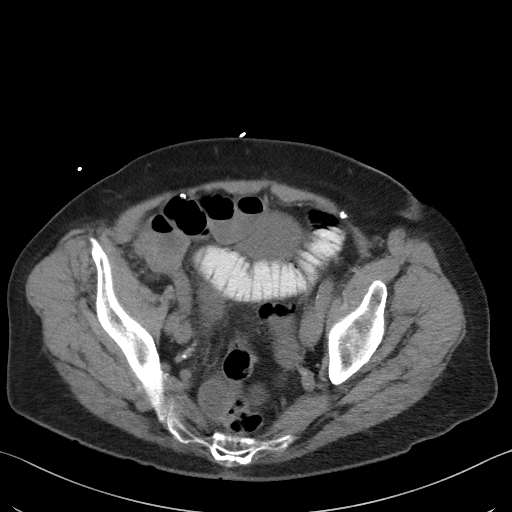
[im 33/87  soft-tissue]
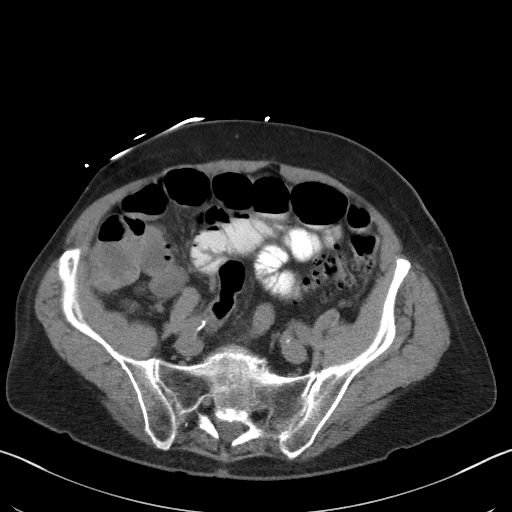
[im 40/87  soft-tissue]
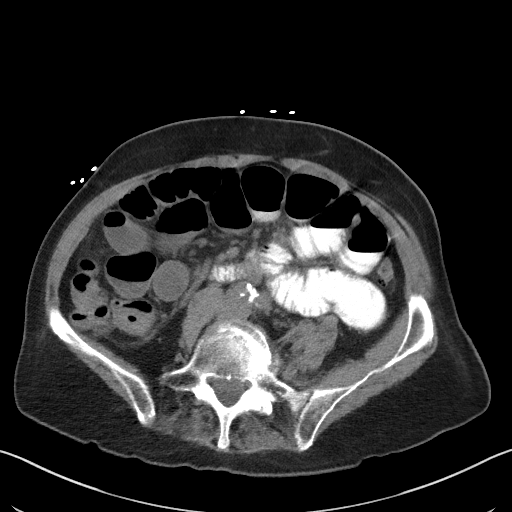
[im 47/87  soft-tissue]
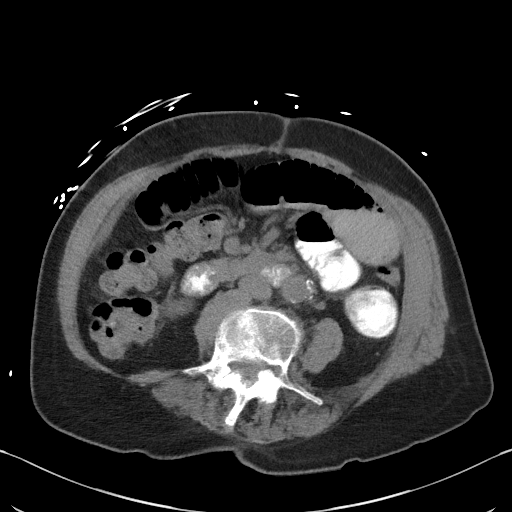
[im 54/87  soft-tissue]
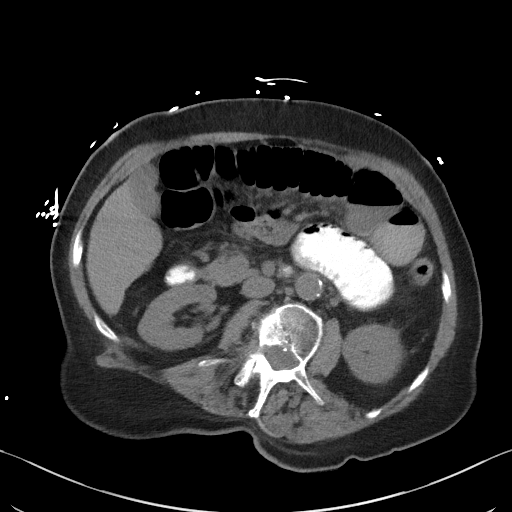
[im 61/87  soft-tissue]
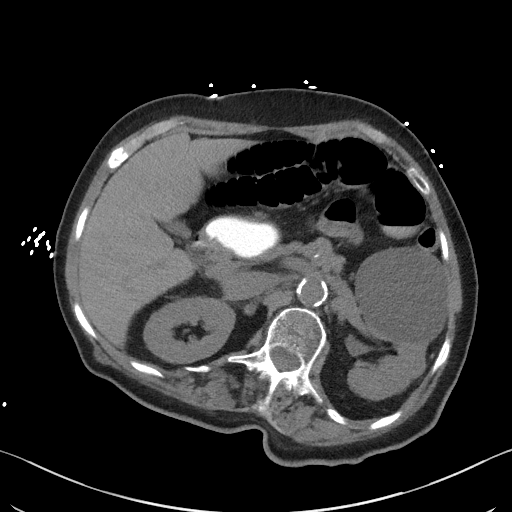
[im 61/87  bone]
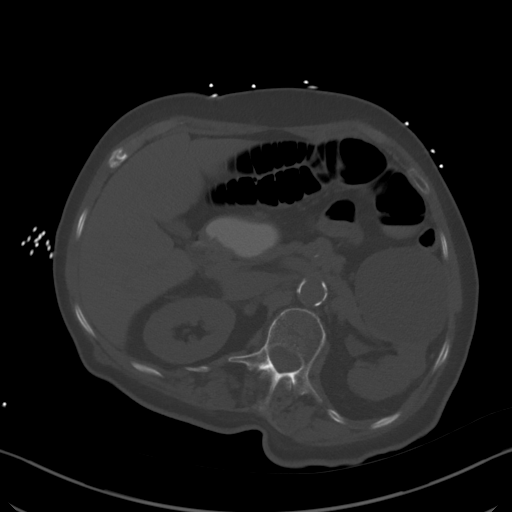
[im 69/87  soft-tissue]
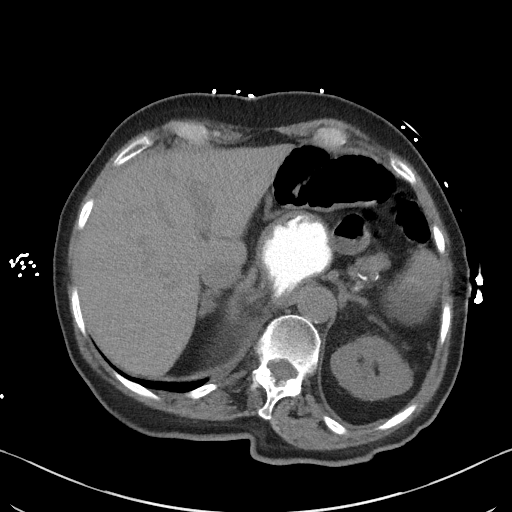
[im 76/87  soft-tissue]
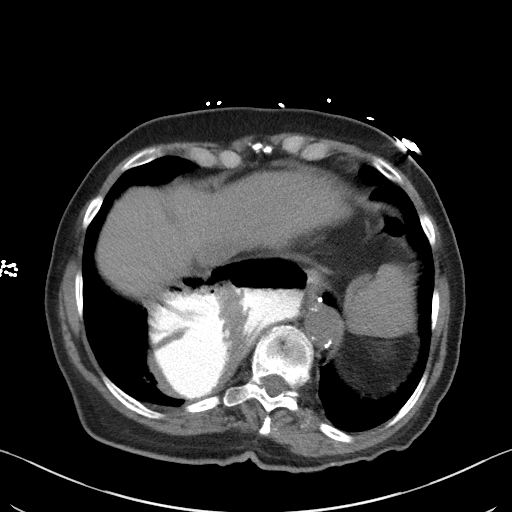
[im 83/87  soft-tissue]
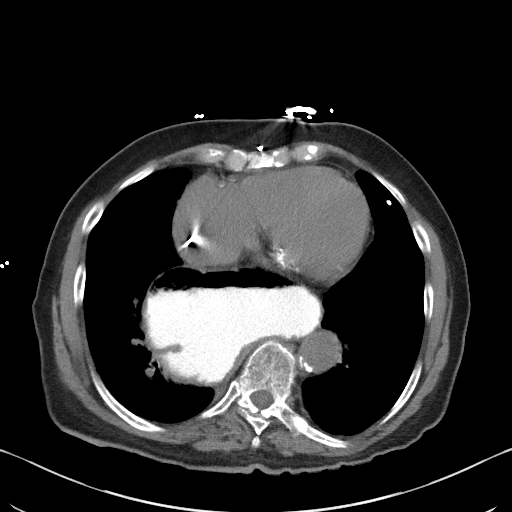

[Series 5: coronal st · coronal · 0.70mm/px · 3 of 99 slices shown]
[im 33/99  soft-tissue]
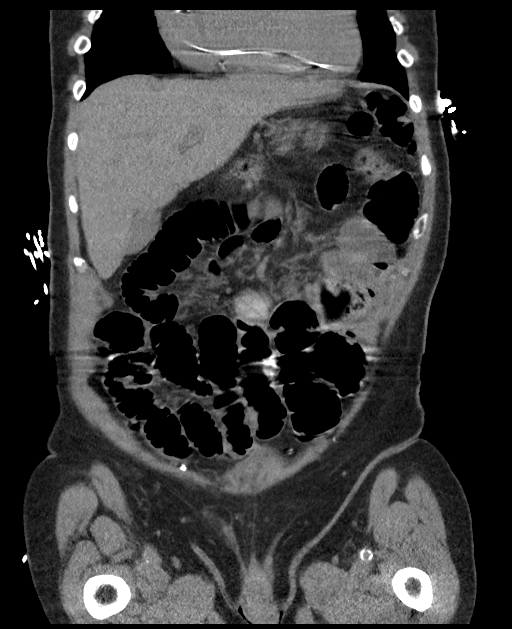
[im 44/99  soft-tissue]
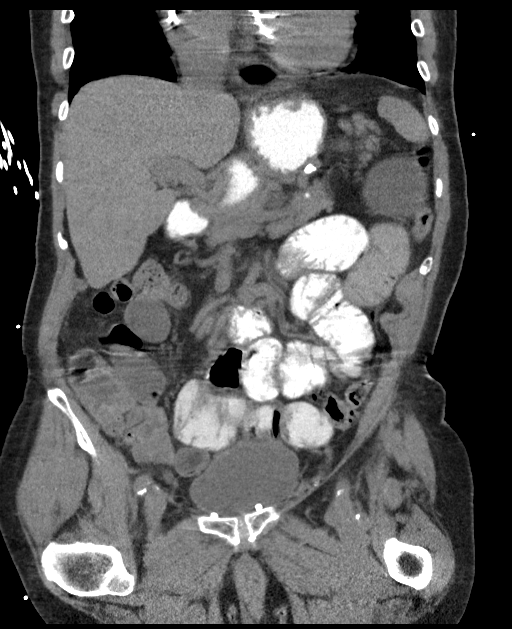
[im 55/99  soft-tissue]
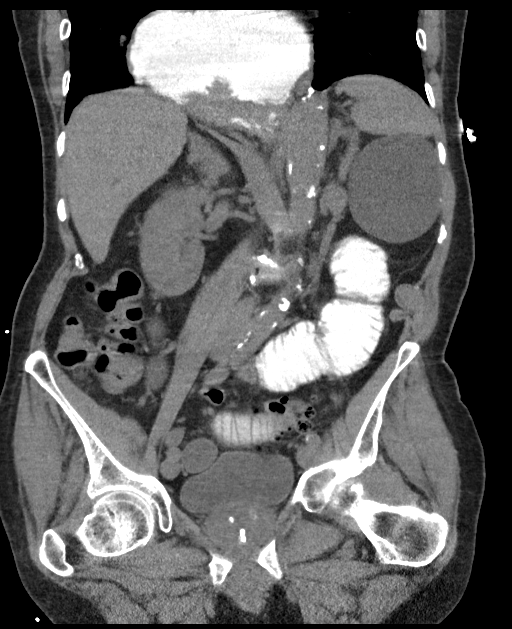

[15 of 46 positions shown; findings below may reference images not displayed]

FINDINGS: Lower chest: Again noted is a calcified granuloma in the lingula.
Stable small pleural-based nodular density in the right middle lobe
on sequence 3, image 6. No large pleural effusions. There is a
prosthetic mitral valve and cardiac lead extending into the right
ventricle. Patient now has a massive hiatal hernia which has
markedly progressed since 5599.

Hepatobiliary: No acute abnormality of the liver or gallbladder on
this noncontrast examination.

Pancreas: Normal appearance of the pancreas without inflammation or
duct dilatation.

Spleen: Normal appearance of spleen without enlargement.

Adrenals/Urinary Tract: There are at least 3 exophytic cysts
involving the left kidney. Largest cyst is in left interpolar region
measuring up to 7.5 cm. Fluid in the urinary bladder. Negative for
hydronephrosis. Probable cyst in the right kidney interpolar region
which is poorly characterized on this noncontrast examination.

Stomach/Bowel: Majority of the stomach is now herniating into the
chest. There is oral contrast within the stomach and proximal small
bowel. Dilated loops of small bowel in the abdomen measuring up to
4.2 cm. Rectum is distended with gas. Extensive diverticulosis in
the sigmoid colon. Distal small bowel is decompressed compared to
the dilated loops of small bowel. There is not an obvious
transitional point between dilated and nondilated small bowel.

Vascular/Lymphatic: Abdominal aorta is heavily calcified without
aneurysm. No significant lymph node enlargement in the abdomen or
pelvis.

Reproductive: Calcifications in the prostate gland.

Other: Surgical mesh material in the anterior lower pelvis and lower
abdomen. No evidence for free fluid or free air.

Musculoskeletal: Levoscoliosis of thoracolumbar spine. Multilevel
degenerative disease in lumbar spine.
IMPRESSION: Large hiatal hernia with dilated loops of small bowel. Findings are
suggestive for at least a partial small bowel obstruction. The bowel
gas pattern is well demonstrated on the topogram images and could be
followed with abdominal radiographs.

Renal cysts.

Scoliosis with multilevel degenerative bone disease.

Postsurgical changes in the chest.

## 2018-03-19 ENCOUNTER — Telehealth: Payer: Self-pay | Admitting: Family

## 2018-03-19 NOTE — Telephone Encounter (Signed)
Tried calling patient number busy.  

## 2018-03-19 NOTE — Telephone Encounter (Signed)
Call pt  I had been waiting on a serum test ( protein electrophoresis) due to his low sodium and didn't see it was collected Would he kindly make a fasting lab appt?  Note: sodium 132 from 131 on 03/06/18

## 2018-03-20 NOTE — Telephone Encounter (Signed)
Left voice mail to call back ok for [facility] to speak to patient 

## 2018-03-20 NOTE — Telephone Encounter (Signed)
Patient states that Kevin [name] did his lab work yesterday. Please advise

## 2018-03-20 NOTE — Telephone Encounter (Signed)
Please advise. Should I send this to [facility] or can you help

## 2018-03-21 NOTE — Telephone Encounter (Addendum)
Left message for Cecille Rubin at the [facility] at [place] requesting labs.    Requested call back and notified it was faxed .

## 2018-03-24 NOTE — Telephone Encounter (Signed)
Did we get the labs?   

## 2018-03-25 ENCOUNTER — Telehealth: Payer: Self-pay

## 2018-03-25 NOTE — Telephone Encounter (Signed)
Pt's INR is 2.3. Pt is alternating 3mg  and 4mg  coumadin.

## 2018-03-25 NOTE — Telephone Encounter (Signed)
INR is low (goal is 2.5 to 3.5)  Ask him to increase dose to 4 mg daily going forward  And repeat INR in 2 weeks

## 2018-03-25 NOTE — Telephone Encounter (Signed)
Pt notified & will have it recheck in 2 weeks at [facility]

## 2018-03-26 NOTE — Telephone Encounter (Signed)
Left message to return call for Kevin [name] at [facility] at [place] [phone] to see if they can fasting lab work see message below from [place] regarding labs

## 2018-04-01 NOTE — Telephone Encounter (Signed)
Left voicemail Cecille Rubin at [place] at [place] requesting labs.  Called patient he states had labwork done last week there .    He will have Cecille Rubin at [place] call me.

## 2018-04-02 ENCOUNTER — Other Ambulatory Visit: Payer: Self-pay | Admitting: Family

## 2018-04-02 DIAGNOSIS — N399 Disorder of urinary system, unspecified: Secondary | ICD-10-CM

## 2018-04-02 NOTE — Telephone Encounter (Signed)
Left message for Cecille Rubin at [facility].

## 2018-04-04 ENCOUNTER — Other Ambulatory Visit: Payer: Self-pay | Admitting: Family

## 2018-04-04 ENCOUNTER — Telehealth: Payer: Self-pay

## 2018-04-04 DIAGNOSIS — E871 Hypo-osmolality and hyponatremia: Secondary | ICD-10-CM

## 2018-04-04 LAB — PROTIME-INR

## 2018-04-04 NOTE — Progress Notes (Signed)
Referral sent 

## 2018-04-04 NOTE — Telephone Encounter (Signed)
Confirm taking 4mg  daily. (per note, was increased to this two weeks ago).   If so, then increase to 5mg  alternating with 4mg  qod.  Recheck pt/inr in 10 days.  Please FYI Dr Derrel Nip so she is aware.

## 2018-04-04 NOTE — Telephone Encounter (Signed)
Received fax from [facility] stating pt's INR is 1.7. Pt is currently taking 4mg  daily. INR goal is 2.5 to 3.5.

## 2018-04-04 NOTE — Telephone Encounter (Signed)
FYI

## 2018-04-04 NOTE — Telephone Encounter (Signed)
Spoke with pt and he stated that he is taking the 4mg  daily. Pt was advised to increase to 5mg  alternating with 4mg  every other day. The pt repeated all directions back with understanding.

## 2018-04-04 NOTE — Telephone Encounter (Signed)
See result note and referral to endocrine

## 2018-04-10 ENCOUNTER — Encounter: Payer: Self-pay | Admitting: Internal Medicine

## 2018-04-10 ENCOUNTER — Telehealth: Payer: Self-pay | Admitting: Internal Medicine

## 2018-04-10 LAB — PROTIME-INR

## 2018-04-10 NOTE — Telephone Encounter (Signed)
Are you still wanting the pt to have the follow up chest xray?

## 2018-04-10 NOTE — Telephone Encounter (Signed)
I'm going through the scheduled orders and pt has an order to have a chest xray pt is coming in on 05/06 can please see if pt wants to still have chest xray. I tried to call pt but he could not hear me. Thank you!

## 2018-04-10 NOTE — Telephone Encounter (Signed)
Dr. Derrel Nip would like for the pt to have this done is he aware of his appt on 04/14/2018?

## 2018-04-10 NOTE — Telephone Encounter (Signed)
I did not order it ,  Joycelyn Schmid did,  So yes

## 2018-04-14 ENCOUNTER — Encounter: Payer: Self-pay | Admitting: Internal Medicine

## 2018-04-14 ENCOUNTER — Ambulatory Visit: Payer: Medicare Other | Admitting: Internal Medicine

## 2018-04-14 VITALS — BP 134/58 | HR 78 | Temp 97.7°F | Resp 15 | Ht 67.0 in | Wt 138.0 lb

## 2018-04-14 DIAGNOSIS — E871 Hypo-osmolality and hyponatremia: Secondary | ICD-10-CM

## 2018-04-14 DIAGNOSIS — B353 Tinea pedis: Secondary | ICD-10-CM

## 2018-04-14 DIAGNOSIS — M5431 Sciatica, right side: Secondary | ICD-10-CM | POA: Diagnosis not present

## 2018-04-14 MED ORDER — TERBINAFINE HCL 1 % EX CREA: 1.0000 "application " | TOPICAL_CREAM | Freq: Two times a day (BID) | CUTANEOUS | 0 refills | Status: DC

## 2018-04-14 NOTE — Progress Notes (Signed)
Subjective:  Patient ID: Kevin [name], male    DOB: Jan 06, 1925  Age: 84 y.o. MRN: [id number]  CC: The primary encounter diagnosis was Hyponatremia. Diagnoses of Tinea pedis of right foot and Sciatica of right side were also pertinent to this visit.  HPI Kevin [name] presents for follow up on several issues:  1) mild chronic stable hyponatremia. Workup started by margaret.  He is not taking lexapro . He has been Taking furosemide daily  For compensated cardiomyopathy with EF 35 to 40% by most recent ECHO.  No history of cirrhosis.   2) His back pain has not improved despite 2 ESI.  He has been released  He is walking less due to pain and fatigue . Dd not toeralte tramadol  "it made me ill''  .  He is using  Using up to  3 tylenol daily   With  Good  results   3) New issue  Erythematous annular papular rash on right foot over 3rd and fourth toe on dorsum of foot.  Starting to itch, since last Thursday   Outpatient Medications Prior to Visit  Medication Sig Dispense Refill  . acetaminophen (TYLENOL) 325 MG tablet Take 650 mg by mouth every 4 (four) hours as needed for fever. Maximum dose for 24 hours is 3000 mg from all sources of Acetaminophen/tylenol    . Alpha-Lipoic Acid 50 MG CAPS Take 1 capsule by mouth daily.    Marland Kitchen amLODipine (NORVASC) 5 MG tablet TAKE 1 TABLET(5 MG) BY MOUTH DAILY 30 tablet 5  . Ascorbic Acid (VITAMIN C) 100 MG tablet Take 100 mg by mouth daily.    . brimonidine (ALPHAGAN) 0.15 % ophthalmic solution Place 1 drop into both eyes 2 (two) times daily.     . Calcium-Magnesium-Vitamin D 400-166.7-133.3 MG-MG-UNIT TABS Take 1 tablet by mouth 2 (two) times daily.    Marland Kitchen co-enzyme Q-10 30 MG capsule Take 1 capsule (30 mg total) by mouth daily. 90 capsule 3  . docusate sodium (COLACE) 100 MG capsule Take 2 capsules (200 mg total) by mouth at bedtime. 60 capsule 5  . ferrous sulfate 324 (65 FE) MG TBEC Take 1 tablet by mouth every other day.    . fluocinonide cream (LIDEX) 4.26  % Apply 1 application topically 2 (two) times daily.  0  . fluoruracil (CARAC) 0.5 % cream Apply topically daily.    . furosemide (LASIX) 20 MG tablet Take 1 tablet (20 mg total) by mouth daily. 30 tablet 2  . latanoprost (XALATAN) 0.005 % ophthalmic solution Place 1 drop into both eyes at bedtime.     . Lidocaine (ASPERCREME LIDOCAINE) 4 % PTCH Apply 1 patch topically daily. Apply to base of back/upper right buttock for sciatic pain. Remove patch after 12 hours.    Marland Kitchen losartan (COZAAR) 100 MG tablet Take 1 tablet (100 mg total) by mouth daily. 90 tablet 0  . meclizine (ANTIVERT) 25 MG tablet Take 25 mg by mouth 3 (three) times daily as needed for dizziness.    . Melatonin 5 MG TABS Take 1 tablet by mouth at bedtime.     . metoprolol tartrate (LOPRESSOR) 25 MG tablet TAKE 1 TABLET(25 MG) BY MOUTH TWICE DAILY 60 tablet 5  . omeprazole (PRILOSEC) 40 MG capsule TAKE 1 CAPSULE(40 MG) BY MOUTH DAILY 90 capsule 0  . polyethylene glycol (MIRALAX / GLYCOLAX) packet Take 17 g by mouth daily. Mix one tablespoon with 8oz of your favorite juice or water every day until you  are having soft formed stools. Then start taking once daily if you didn't have a stool the day before. 30 each 0  . Red Yeast Rice 600 MG CAPS Take 1 capsule by mouth daily.    Marland Kitchen RUTIN PO Take 500 mg by mouth daily.    . tamsulosin (FLOMAX) 0.4 MG CAPS capsule TAKE 1 CAPSULE(0.4 MG) BY MOUTH DAILY 90 capsule 0  . traMADol (ULTRAM) 50 MG tablet TK 1 T PO  BID PRN  0  . Turmeric 500 MG CAPS Take 1 capsule by mouth daily.    Marland Kitchen warfarin (COUMADIN) 3 MG tablet TAKE 1 TABLET(3 MG) BY MOUTH DAILY FOR 1 DOSE 30 tablet 2  . escitalopram (LEXAPRO) 10 MG tablet Take 1 tablet by mouth daily.  3   No facility-administered medications prior to visit.     Review of Systems;  Patient denies headache, fevers, malaise, unintentional weight loss, skin rash, eye pain, sinus congestion and sinus pain, sore throat, dysphagia,  hemoptysis , cough, dyspnea,  wheezing, chest pain, palpitations, orthopnea, edema, abdominal pain, nausea, melena, diarrhea, constipation, flank pain, dysuria, hematuria, urinary  Frequency, nocturia, numbness, tingling, seizures,  Focal weakness, Loss of consciousness,  Tremor, insomnia, depression, anxiety, and suicidal ideation.      Objective:  BP (!) 134/58 (BP Location: Left Arm, Patient Position: Sitting, Cuff Size: Normal)   Pulse 78   Temp 97.7 F (36.5 C) (Oral)   Resp 15   Ht 5\' 7"  (1.702 m)   Wt 138 lb (62.6 kg)   SpO2 95%   BMI 21.61 kg/m   BP Readings from Last 3 Encounters:  04/14/18 (!) 134/58  03/06/18 134/62  02/21/18 138/74    Wt Readings from Last 3 Encounters:  04/14/18 138 lb (62.6 kg)  03/06/18 139 lb 12.8 oz (63.4 kg)  02/21/18 140 lb 12.8 oz (63.9 kg)    General appearance: alert, cooperative and appears stated age Ears: normal TM's and external ear canals both ears Throat: lips, mucosa, and tongue normal; teeth and gums normal Neck: no adenopathy, no carotid bruit, supple, symmetrical, trachea midline and thyroid not enlarged, symmetric, no tenderness/mass/nodules Back: symmetric, no curvature. ROM normal. No CVA tenderness. Lungs: clear to auscultation bilaterally Heart: regular rate and rhythm, S1, S2 normal, no murmur, click, rub or gallop Abdomen: soft, non-tender; bowel sounds normal; no masses,  no organomegaly Pulses: 2+ and symmetric Skin: annular papular erythematous rash on dorsum of foot Skin color, texture, turgor normal. No rashes or lesions Lymph nodes: Cervical, supraclavicular, and axillary nodes normal.  No results found for: HGBA1C  Lab Results  Component Value Date   CREATININE 0.93 03/06/2018   CREATININE 0.85 02/21/2018   CREATININE 0.87 11/22/2017    Lab Results  Component Value Date   WBC 5.0 03/06/2018   HGB 11.5 (L) 03/06/2018   HCT 33.2 (L) 03/06/2018   PLT 209.0 03/06/2018   GLUCOSE 102 (H) 03/06/2018   CHOL 187 03/06/2018   TRIG 81.0  03/06/2018   HDL 66.40 03/06/2018   LDLDIRECT 105.9 03/13/2012   LDLCALC 104 (H) 03/06/2018   ALT 22 03/06/2018   AST 27 03/06/2018   NA 132 (L) 03/06/2018   K 4.1 03/06/2018   CL 97 03/06/2018   CREATININE 0.93 03/06/2018   BUN 20 03/06/2018   CO2 29 03/06/2018   TSH 3.14 03/06/2018   PSA 0.87 02/21/2018   INR 3.2 (A) 02/06/2018    Assessment & Plan:   Problem List Items Addressed This Visit  Sciatica of right side    ESI x 2 failed to relieve his pain .  Continue tylenol tid,  Will add hydrocodone if pain progresses      Ringworm of foot   Relevant Medications   terbinafine (LAMISIL) 1 % cream   Hyponatremia - Primary    Mild,  unchanged by repeat labs.SPEP  and urine IFE  Is normal.He has been advised to reduce furosemide use to prn instead of daily  Endocrinology referral in progress  Lab Results  Component Value Date   NA 132 (L) 03/06/2018   K 4.1 03/06/2018   CL 97 03/06/2018   CO2 29 03/06/2018         Relevant Orders   Basic metabolic panel    A total of 40 minutes of face to face time was spent with patient more than half of which was spent in counselling and coordination of care  I have discontinued [place] escitalopram. I am also having him start on terbinafine. Additionally, I am having him maintain his brimonidine, latanoprost, vitamin C, ferrous sulfate, co-enzyme Q-10, fluocinonide cream, meclizine, furosemide, Alpha-Lipoic Acid, Calcium-Magnesium-Vitamin D, Melatonin, Red Yeast Rice, RUTIN PO, Turmeric, Lidocaine, acetaminophen, polyethylene glycol, traMADol, amLODipine, warfarin, omeprazole, metoprolol tartrate, tamsulosin, losartan, docusate sodium, and fluoruracil.  Meds ordered this encounter  Medications  . terbinafine (LAMISIL) 1 % cream    Sig: Apply 1 application topically 2 (two) times daily. To affected area on foot    Dispense:  14 g    Refill:  0    Medications Discontinued During This Encounter  Medication Reason  .  escitalopram (LEXAPRO) 10 MG tablet     Follow-up: No follow-ups on file.   Crecencio Mc, MD

## 2018-04-14 NOTE — Patient Instructions (Addendum)
For your back pain:  I want you to take up to 1000 mg tylenol two times  Daily  (with your other medications)   for your back pain and headaches.  Do not wait until you are in pain.   2000 mg daily is the maximum amount that is perfectly safe to take  Daily for pain control.   For your low sodium:  You can use  A little table salt with your meals to improve your sodium level  Stop using the furosemide on a daily basis  Return in one week for a sodium level   If it is still low,  I recommend seeing Dr Lavone Orn to check you out to see if there is another cause for the low sodium   I am prescribing an ointment to use for the ringwor on your foot: terbinafine,  Use it twice daily .  If it does not resolve in 2-3 weeks  ,  See Dr Evorn Gong

## 2018-04-15 DIAGNOSIS — B353 Tinea pedis: Secondary | ICD-10-CM | POA: Insufficient documentation

## 2018-04-15 NOTE — Assessment & Plan Note (Signed)
Mild,  unchanged by repeat labs.SPEP  and urine IFE  Is normal.He has been advised to reduce furosemide use to prn instead of daily  Endocrinology referral in progress  Lab Results  Component Value Date   NA 132 (L) 03/06/2018   K 4.1 03/06/2018   CL 97 03/06/2018   CO2 29 03/06/2018

## 2018-04-15 NOTE — Assessment & Plan Note (Signed)
ESI x 2 failed to relieve his pain .  Continue tylenol tid,  Will add hydrocodone if pain progresses

## 2018-05-09 ENCOUNTER — Telehealth: Payer: Self-pay

## 2018-05-09 NOTE — Telephone Encounter (Signed)
Copied from [place] [id number]. Topic: General - Other >> May 09, 2018  8:04 AM Yvette Rack wrote: Reason for CRM: [facility] assistant at Dr Annamaria Helling office [place] [phone] calling stating that the Dr  wanted the pt to stop taking Warfin for 3 days due to having tooth extract on Thursday please give them a call at [phone] if any questions

## 2018-05-09 NOTE — Telephone Encounter (Signed)
Message received .  Ok

## 2018-05-09 NOTE — Telephone Encounter (Signed)
Dr. Allen Kell office called to let us know that the pt will be stopping his coumadin for 3 days due to having a tooth extracted on Tuesday.

## 2018-05-11 ENCOUNTER — Other Ambulatory Visit: Payer: Self-pay | Admitting: Internal Medicine

## 2018-05-13 ENCOUNTER — Telehealth: Payer: Self-pay | Admitting: Internal Medicine

## 2018-05-13 LAB — PROTIME-INR

## 2018-05-13 NOTE — Telephone Encounter (Signed)
Spoke with pt and informed him of his lab results. Pt stated that he has been off of his coumadin for three days due to having a tooth extracted today. Spoke with Dr. Derrel Nip verbally and she stated that the pt should not start back on his coumadin until Friday and then repeat his INR again in one week from Friday. Pt repeated everything back with understanding.

## 2018-05-13 NOTE — Telephone Encounter (Signed)
INR is slightly high. (goal is 2. 5 to 3.5 ;  His is 3.6 today) continue current regimen and recheck in one week

## 2018-05-14 ENCOUNTER — Other Ambulatory Visit: Payer: Self-pay

## 2018-05-14 ENCOUNTER — Inpatient Hospital Stay
Admission: EM | Admit: 2018-05-14 | Discharge: 2018-05-16 | DRG: 920 | Disposition: A | Discharge status: Skilled Nursing Facility | Payer: Medicare Other | Source: Skilled Nursing Facility | Attending: Internal Medicine | Admitting: Internal Medicine

## 2018-05-14 ENCOUNTER — Encounter: Payer: Self-pay | Admitting: Emergency Medicine

## 2018-05-14 DIAGNOSIS — R58 Hemorrhage, not elsewhere classified: Secondary | ICD-10-CM

## 2018-05-14 DIAGNOSIS — H9190 Unspecified hearing loss, unspecified ear: Secondary | ICD-10-CM | POA: Diagnosis present

## 2018-05-14 DIAGNOSIS — Z85820 Personal history of malignant melanoma of skin: Secondary | ICD-10-CM

## 2018-05-14 DIAGNOSIS — Z66 Do not resuscitate: Secondary | ICD-10-CM | POA: Diagnosis present

## 2018-05-14 DIAGNOSIS — Z951 Presence of aortocoronary bypass graft: Secondary | ICD-10-CM

## 2018-05-14 DIAGNOSIS — I2510 Atherosclerotic heart disease of native coronary artery without angina pectoris: Secondary | ICD-10-CM | POA: Diagnosis present

## 2018-05-14 DIAGNOSIS — I110 Hypertensive heart disease with heart failure: Secondary | ICD-10-CM | POA: Diagnosis present

## 2018-05-14 DIAGNOSIS — Y836 Removal of other organ (partial) (total) as the cause of abnormal reaction of the patient, or of later complication, without mention of misadventure at the time of the procedure: Secondary | ICD-10-CM | POA: Diagnosis present

## 2018-05-14 DIAGNOSIS — D62 Acute posthemorrhagic anemia: Secondary | ICD-10-CM | POA: Diagnosis present

## 2018-05-14 DIAGNOSIS — E785 Hyperlipidemia, unspecified: Secondary | ICD-10-CM | POA: Diagnosis present

## 2018-05-14 DIAGNOSIS — K91841 Postprocedural hemorrhage and hematoma of a digestive system organ or structure following other procedure: Principal | ICD-10-CM | POA: Diagnosis present

## 2018-05-14 DIAGNOSIS — R791 Abnormal coagulation profile: Secondary | ICD-10-CM | POA: Diagnosis present

## 2018-05-14 DIAGNOSIS — H409 Unspecified glaucoma: Secondary | ICD-10-CM | POA: Diagnosis present

## 2018-05-14 DIAGNOSIS — S0083XA Contusion of other part of head, initial encounter: Secondary | ICD-10-CM | POA: Diagnosis present

## 2018-05-14 DIAGNOSIS — K219 Gastro-esophageal reflux disease without esophagitis: Secondary | ICD-10-CM | POA: Diagnosis present

## 2018-05-14 DIAGNOSIS — I509 Heart failure, unspecified: Secondary | ICD-10-CM | POA: Diagnosis present

## 2018-05-14 DIAGNOSIS — Z952 Presence of prosthetic heart valve: Secondary | ICD-10-CM

## 2018-05-14 DIAGNOSIS — Y828 Other medical devices associated with adverse incidents: Secondary | ICD-10-CM | POA: Diagnosis present

## 2018-05-14 DIAGNOSIS — I4891 Unspecified atrial fibrillation: Secondary | ICD-10-CM | POA: Diagnosis present

## 2018-05-14 DIAGNOSIS — T45515A Adverse effect of anticoagulants, initial encounter: Secondary | ICD-10-CM

## 2018-05-14 DIAGNOSIS — Z7901 Long term (current) use of anticoagulants: Secondary | ICD-10-CM

## 2018-05-14 DIAGNOSIS — M810 Age-related osteoporosis without current pathological fracture: Secondary | ICD-10-CM | POA: Diagnosis present

## 2018-05-14 DIAGNOSIS — D649 Anemia, unspecified: Secondary | ICD-10-CM

## 2018-05-14 DIAGNOSIS — Z950 Presence of cardiac pacemaker: Secondary | ICD-10-CM

## 2018-05-14 DIAGNOSIS — I428 Other cardiomyopathies: Secondary | ICD-10-CM | POA: Diagnosis present

## 2018-05-14 LAB — CBC WITH DIFFERENTIAL/PLATELET
Basophils Absolute: 0 K/uL (ref 0–0.1)
Basophils Relative: 0 %
Eosinophils Absolute: 0 K/uL (ref 0–0.7)
Eosinophils Relative: 0 %
HCT: 26.2 % — ABNORMAL LOW (ref 40.0–52.0)
Hemoglobin: 8.9 g/dL — ABNORMAL LOW (ref 13.0–18.0)
Lymphocytes Relative: 8 %
Lymphs Abs: 0.5 K/uL — ABNORMAL LOW (ref 1.0–3.6)
MCH: 34.8 pg — ABNORMAL HIGH (ref 26.0–34.0)
MCHC: 33.9 g/dL (ref 32.0–36.0)
MCV: 102.7 fL — ABNORMAL HIGH (ref 80.0–100.0)
Monocytes Absolute: 0.3 K/uL (ref 0.2–1.0)
Monocytes Relative: 5 %
Neutro Abs: 5.5 K/uL (ref 1.4–6.5)
Neutrophils Relative %: 87 %
Platelets: 190 K/uL (ref 150–440)
RBC: 2.56 MIL/uL — ABNORMAL LOW (ref 4.40–5.90)
RDW: 13.5 % (ref 11.5–14.5)
WBC: 6.3 K/uL (ref 3.8–10.6)

## 2018-05-14 LAB — COMPREHENSIVE METABOLIC PANEL WITH GFR
ALT: 20 U/L (ref 17–63)
AST: 32 U/L (ref 15–41)
Albumin: 3.7 g/dL (ref 3.5–5.0)
Alkaline Phosphatase: 41 U/L (ref 38–126)
Anion gap: 9 (ref 5–15)
BUN: 65 mg/dL — ABNORMAL HIGH (ref 6–20)
CO2: 20 mmol/L — ABNORMAL LOW (ref 22–32)
Calcium: 9 mg/dL (ref 8.9–10.3)
Chloride: 108 mmol/L (ref 101–111)
Creatinine, Ser: 0.72 mg/dL (ref 0.61–1.24)
GFR calc Af Amer: >60 mL/min (ref >60)
GFR calc non Af Amer: >60 mL/min (ref >60)
Glucose, Bld: 182 mg/dL — ABNORMAL HIGH (ref 65–99)
Potassium: 4.1 mmol/L (ref 3.5–5.1)
Sodium: 137 mmol/L (ref 135–145)
Total Bilirubin: 1.1 mg/dL (ref 0.3–1.2)
Total Protein: 6.2 g/dL — ABNORMAL LOW (ref 6.5–8.1)

## 2018-05-14 LAB — HEMOGLOBIN AND HEMATOCRIT, BLOOD
HCT: 24.8 % — ABNORMAL LOW (ref 40.0–52.0)
Hemoglobin: 8.5 g/dL — ABNORMAL LOW (ref 13.0–18.0)

## 2018-05-14 LAB — ABO/RH: ABO/RH(D): O POS

## 2018-05-14 LAB — PROTIME-INR
INR: 2.21
PROTHROMBIN TIME: 24.3 s — AB (ref 11.4–15.2)

## 2018-05-14 MED ORDER — FLUOROURACIL 0.5 % EX CREA: 1.0000 "application " | TOPICAL_CREAM | Freq: Every day | CUTANEOUS | Status: DC

## 2018-05-14 MED ORDER — SODIUM CHLORIDE 0.9 % IV SOLN
INTRAVENOUS | Status: DC
  Administered 2018-05-14: 19:00:00 via INTRAVENOUS

## 2018-05-14 MED ORDER — RED YEAST RICE 600 MG PO CAPS: 1.0000 | ORAL_CAPSULE | Freq: Every day | ORAL | Status: DC

## 2018-05-14 MED ORDER — VITAMIN C 500 MG PO TABS
250.0000 mg | ORAL_TABLET | Freq: Every day | ORAL | Status: DC
  Administered 2018-05-14 – 2018-05-16 (×3): 250 mg via ORAL
  Filled 2018-05-14 (×3): qty 0.5

## 2018-05-14 MED ORDER — SODIUM CHLORIDE 0.9 % IV SOLN
10.0000 mL/h | Freq: Once | INTRAVENOUS | Status: AC
  Administered 2018-05-14: 10 mL/h via INTRAVENOUS

## 2018-05-14 MED ORDER — POLYETHYLENE GLYCOL 3350 17 G PO PACK
17.0000 g | PACK | Freq: Every day | ORAL | Status: DC
Start: 2018-05-14 — End: 2018-05-16
  Administered 2018-05-14 – 2018-05-16 (×2): 17 g via ORAL
  Filled 2018-05-14 (×2): qty 1

## 2018-05-14 MED ORDER — MECLIZINE HCL 25 MG PO TABS
25.0000 mg | ORAL_TABLET | Freq: Three times a day (TID) | ORAL | Status: DC | PRN
  Filled 2018-05-14: qty 1

## 2018-05-14 MED ORDER — MELATONIN 5 MG PO TABS
1.0000 | ORAL_TABLET | Freq: Every day | ORAL | Status: DC
Start: 2018-05-14 — End: 2018-05-16
  Administered 2018-05-14 – 2018-05-15 (×2): 5 mg via ORAL
  Filled 2018-05-14 (×3): qty 1

## 2018-05-14 MED ORDER — FERROUS SULFATE 325 (65 FE) MG PO TABS
325.0000 mg | ORAL_TABLET | ORAL | Status: DC
  Administered 2018-05-14 – 2018-05-16 (×2): 325 mg via ORAL
  Filled 2018-05-14 (×2): qty 1

## 2018-05-14 MED ORDER — LOSARTAN POTASSIUM 50 MG PO TABS
100.0000 mg | ORAL_TABLET | Freq: Every day | ORAL | Status: DC
  Administered 2018-05-16: 100 mg via ORAL
  Filled 2018-05-14 (×2): qty 2

## 2018-05-14 MED ORDER — SENNOSIDES-DOCUSATE SODIUM 8.6-50 MG PO TABS: 1.0000 | ORAL_TABLET | Freq: Every evening | ORAL | Status: DC | PRN

## 2018-05-14 MED ORDER — ACETAMINOPHEN 650 MG RE SUPP: 650.0000 mg | Freq: Four times a day (QID) | RECTAL | Status: DC | PRN

## 2018-05-14 MED ORDER — TAMSULOSIN HCL 0.4 MG PO CAPS
0.4000 mg | ORAL_CAPSULE | Freq: Every day | ORAL | Status: DC
  Administered 2018-05-14 – 2018-05-15 (×2): 0.4 mg via ORAL
  Filled 2018-05-14 (×2): qty 1

## 2018-05-14 MED ORDER — CALCIUM CARBONATE-VITAMIN D 500-200 MG-UNIT PO TABS
1.0000 | ORAL_TABLET | Freq: Two times a day (BID) | ORAL | Status: DC
  Administered 2018-05-14 – 2018-05-16 (×5): 1 via ORAL
  Filled 2018-05-14 (×5): qty 1

## 2018-05-14 MED ORDER — LIDOCAINE-EPINEPHRINE (PF) 1 %-1:200000 IJ SOLN
30.0000 mL | Freq: Once | INTRAMUSCULAR | Status: AC
  Administered 2018-05-14: 30 mL
  Filled 2018-05-14 (×2): qty 30

## 2018-05-14 MED ORDER — MAGNESIUM OXIDE 400 (241.3 MG) MG PO TABS
200.0000 mg | ORAL_TABLET | Freq: Two times a day (BID) | ORAL | Status: DC
  Administered 2018-05-14 – 2018-05-16 (×5): 200 mg via ORAL
  Filled 2018-05-14 (×5): qty 1

## 2018-05-14 MED ORDER — ONDANSETRON HCL 4 MG PO TABS: 4.0000 mg | ORAL_TABLET | Freq: Four times a day (QID) | ORAL | Status: DC | PRN

## 2018-05-14 MED ORDER — DOCUSATE SODIUM 100 MG PO CAPS
200.0000 mg | ORAL_CAPSULE | Freq: Every day | ORAL | Status: DC
  Administered 2018-05-14 – 2018-05-15 (×2): 200 mg via ORAL
  Filled 2018-05-14 (×2): qty 2

## 2018-05-14 MED ORDER — ONDANSETRON HCL 4 MG/2ML IJ SOLN: 4.0000 mg | Freq: Four times a day (QID) | INTRAMUSCULAR | Status: DC | PRN

## 2018-05-14 MED ORDER — AMLODIPINE BESYLATE 5 MG PO TABS
5.0000 mg | ORAL_TABLET | Freq: Every day | ORAL | Status: DC
  Administered 2018-05-16: 5 mg via ORAL
  Filled 2018-05-14 (×2): qty 1

## 2018-05-14 MED ORDER — CALCIUM-MAGNESIUM-VITAMIN D 400-166.7-133.3 MG-MG-UNIT PO TABS: 1.0000 | ORAL_TABLET | Freq: Two times a day (BID) | ORAL | Status: DC

## 2018-05-14 MED ORDER — LATANOPROST 0.005 % OP SOLN
1.0000 [drp] | Freq: Every day | OPHTHALMIC | Status: DC
  Administered 2018-05-14 – 2018-05-15 (×2): 1 [drp] via OPHTHALMIC
  Filled 2018-05-14: qty 2.5

## 2018-05-14 MED ORDER — ESCITALOPRAM OXALATE 10 MG PO TABS
10.0000 mg | ORAL_TABLET | Freq: Every day | ORAL | Status: DC
  Administered 2018-05-14 – 2018-05-16 (×3): 10 mg via ORAL
  Filled 2018-05-14 (×3): qty 1

## 2018-05-14 MED ORDER — BRIMONIDINE TARTRATE 0.15 % OP SOLN
1.0000 [drp] | Freq: Two times a day (BID) | OPHTHALMIC | Status: DC
  Administered 2018-05-14 – 2018-05-16 (×5): 1 [drp] via OPHTHALMIC
  Filled 2018-05-14: qty 5

## 2018-05-14 MED ORDER — VITAMIN K1 10 MG/ML IJ SOLN
5.0000 mg | Freq: Once | INTRAMUSCULAR | Status: AC
  Administered 2018-05-14: 5 mg via INTRAVENOUS
  Filled 2018-05-14: qty 0.5

## 2018-05-14 MED ORDER — TRANEXAMIC ACID 1000 MG/10ML IV SOLN
500.0000 mg | Freq: Once | INTRAVENOUS | Status: AC
  Administered 2018-05-14: 500 mg via TOPICAL
  Filled 2018-05-14 (×2): qty 10

## 2018-05-14 MED ORDER — SODIUM CHLORIDE 0.9% FLUSH
3.0000 mL | Freq: Two times a day (BID) | INTRAVENOUS | Status: DC
  Administered 2018-05-14 – 2018-05-15 (×3): 3 mL via INTRAVENOUS

## 2018-05-14 MED ORDER — SODIUM CHLORIDE 0.9 % IV SOLN: 250.0000 mL | INTRAVENOUS | Status: DC | PRN

## 2018-05-14 MED ORDER — COENZYME Q10 30 MG PO CAPS: 30.0000 mg | ORAL_CAPSULE | Freq: Every day | ORAL | Status: DC

## 2018-05-14 MED ORDER — LIDOCAINE 5 % EX PTCH
1.0000 | MEDICATED_PATCH | Freq: Every day | CUTANEOUS | Status: DC
  Administered 2018-05-15 – 2018-05-16 (×2): 1 via TRANSDERMAL
  Filled 2018-05-14 (×2): qty 1

## 2018-05-14 MED ORDER — PANTOPRAZOLE SODIUM 40 MG PO TBEC
40.0000 mg | DELAYED_RELEASE_TABLET | Freq: Every day | ORAL | Status: DC
  Administered 2018-05-14 – 2018-05-16 (×3): 40 mg via ORAL
  Filled 2018-05-14 (×3): qty 1

## 2018-05-14 MED ORDER — ACETAMINOPHEN 325 MG PO TABS
650.0000 mg | ORAL_TABLET | Freq: Four times a day (QID) | ORAL | Status: DC | PRN
  Administered 2018-05-15 (×2): 650 mg via ORAL
  Filled 2018-05-14 (×2): qty 2

## 2018-05-14 MED ORDER — METOPROLOL TARTRATE 25 MG PO TABS
25.0000 mg | ORAL_TABLET | Freq: Two times a day (BID) | ORAL | Status: DC
  Administered 2018-05-14 – 2018-05-16 (×3): 25 mg via ORAL
  Filled 2018-05-14 (×4): qty 1

## 2018-05-14 MED ORDER — SODIUM CHLORIDE 0.9% FLUSH: 3.0000 mL | INTRAVENOUS | Status: DC | PRN

## 2018-05-14 MED ORDER — HYDROCODONE-ACETAMINOPHEN 5-325 MG PO TABS
1.0000 | ORAL_TABLET | ORAL | Status: DC | PRN
  Administered 2018-05-14 – 2018-05-16 (×2): 1 via ORAL
  Filled 2018-05-14 (×2): qty 1

## 2018-05-14 NOTE — Progress Notes (Signed)
Per Dr. Estanislado Pandy transfusion not needed as of yet Hgb 8.5 continue to monitor patient for oral bleeding and labs. Oral care performed and bleeding not active in oral cavity as of now, will continue to monitor. Patient wears hearing aids, but they were taken home with his niece so that they would not get lost.

## 2018-05-14 NOTE — Care Management Note (Signed)
Case Management Note  Patient Details  Name: Kevin [name] MRN: [id number] Date of Birth: January 20, 1925  Subjective/Objective:     Patient admitted to [facility] under observation status for a jaw hematoma. Patient lives at [facility] independent living. Patient uses a walker in the home but no other DME. Patient does report increasing weakness and difficulty completing activities of daily living. His 82 year old brother Nayson Traweek [phone] helps with his activities of daily living. Patient family able to provide transportation. PCP is Dr Derrel Nip. Utilizes [facility] on [address] and has no issues obtaining medications.              Action/Plan:  Family has concerns over increasing weakness and is requesting further services. Will consult with primary RN to place PT orders. RNCM will continue to follow for anticipated discharge needs. Expected Discharge Date:                  Expected Discharge Plan:     In-House Referral:     Discharge planning Services     Post Acute Care Choice:    Choice offered to:     DME Arranged:    DME Agency:     HH Arranged:    HH Agency:     Status of Service:     If discussed at [employer], dates discussed:    Additional Comments:  Latanya Maudlin, RN 05/14/2018, 3:13 PM

## 2018-05-14 NOTE — Progress Notes (Signed)
PHARMACIST - PHYSICIAN ORDER COMMUNICATION  CONCERNING: P&T Medication Policy on Herbal Medications  DESCRIPTION:  This patient's order for:  CoQ 10 and Red Yeast Rice  has been noted.  This product(s) is classified as an "herbal" or natural product. Due to a lack of definitive safety studies or FDA approval, nonstandard manufacturing practices, plus the potential risk of unknown drug-drug interactions while on inpatient medications, the Pharmacy and Therapeutics Committee does not permit the use of "herbal" or natural products of this type within [facility].   ACTION TAKEN: The pharmacy department is unable to verify this order. Please reevaluate patient's clinical condition at discharge and address if the herbal or natural product(s) should be resumed at that time.

## 2018-05-14 NOTE — ED Triage Notes (Signed)
Pt presents to ED via [place] from [facility] at [place] c/o bleeding from mouth. Pt reports tooth extraction done yesterday. Pt brushed teeth this morning and called EMS after copious bleeding from site. EMS report multiple blood clots found on scene in sink and on bedsheets. Pt denies pain at this time. VS WNL. Bruising and swelling noted to L lower jaw, blood clots present in mouth. Pt on warfarin.

## 2018-05-14 NOTE — ED Notes (Signed)
Blood consent signed and in chart

## 2018-05-14 NOTE — H&P (Addendum)
[facility] NAME: Kevin [name]    MR#:  [id number]  DATE OF BIRTH:  Aug 26, 1925  DATE OF ADMISSION:  05/14/2018  PRIMARY CARE PHYSICIAN: Crecencio Mc, MD   REQUESTING/REFERRING PHYSICIAN:   CHIEF COMPLAINT:   Chief Complaint  Patient presents with  . Post-op Problem    HISTORY OF PRESENT ILLNESS: Kevin [name]  is a 84 y.o. male with a known history of atrial fibrillation on Coumadin for anticoagulation as outpatient, cardiomyopathy, congestive heart failure, degenerative joint disease, hypertension, GERD, glaucoma, mitral valve replacement with mechanical valve, hypertension, hyperlipidemia, glaucoma had a tooth extraction yesterday patient is a resident of [facility].  Today morning dentist office called to check on him.  Patient had a lot of bleeding from the jaw from where his tooth was extracted since last night.  Family brought him to the emergency room and clot was extracted by suction from the jaw and packing was done orally to control the bleeding.  Patient received IV vitamin K to reverse Coumadin coagulopathy and also tranexamic acid in the emergency room.  Hospitalist service was consulted for further care.  PAST MEDICAL HISTORY:   Past Medical History:  Diagnosis Date  . Atrial fibrillation ([place])   . Cancer ([place])    melanoma- right ear  . Cardiomyopathy, secondary ([place])   . Chronic headaches started age 62  . Congestive heart failure (CHF) ([place])   . Coronary atherosclerosis of native coronary artery   . DJD (degenerative joint disease)   . Essential hypertension, benign   . GERD (gastroesophageal reflux disease)   . Glaucoma   . History of mitral valve replacement with mechanical valve   . Hyperlipidemia, unspecified   . Hypertension   . Lumbago   . Osteoporosis    secondary to low testoerone  . S/P MVR (mitral valve replacement)   . Sciatica   . Scoliosis     PAST SURGICAL  HISTORY:  Past Surgical History:  Procedure Laterality Date  . CARDIOVERSION  2013  . CATARACT EXTRACTION, BILATERAL     x 2  . CORONARY ARTERY BYPASS GRAFT  12/2014   coronary artery bypass w/arterial grafts   . HERNIA REPAIR     x 2  . MELANOMA EXCISION     right ear  . MITRAL VALVE REPLACEMENT  1994   [facility]  . NASAL SEPTUM SURGERY  1963  . PERMANENT PACEMAKER INSERTION Left 12/22/2014    SOCIAL HISTORY:  Social History   Tobacco Use  . Smoking status: Never Smoker  . Smokeless tobacco: Never Used  Substance Use Topics  . Alcohol use: No    FAMILY HISTORY:  Family History  Problem Relation Age of Onset  . Cancer Father        unknown  . Alzheimer's disease Brother   . Arrhythmia Brother   . Mental illness Mother     DRUG ALLERGIES:  Allergies  Allergen Reactions  . Cymbalta [Duloxetine Hcl] Other (See Comments)    Urinary retention, constipation and insomnia  . Aspirin     Heart operation was told to never take Aspirin    REVIEW OF SYSTEMS:   CONSTITUTIONAL: No fever, fatigue or weakness.  EYES: No blurred or double vision.  EARS, NOSE, AND THROAT: No tinnitus or ear pain.  Bleeding from jaw RESPIRATORY: No cough, shortness of breath, wheezing or hemoptysis.  CARDIOVASCULAR: No chest pain, orthopnea, edema.  GASTROINTESTINAL: No nausea, vomiting,  diarrhea or abdominal pain.  GENITOURINARY: No dysuria, hematuria.  ENDOCRINE: No polyuria, nocturia,  HEMATOLOGY: No anemia, easy bruising or bleeding SKIN: No rash or lesion. MUSCULOSKELETAL: No joint pain or arthritis.   NEUROLOGIC: No tingling, numbness, weakness.  PSYCHIATRY: No anxiety or depression.   MEDICATIONS AT HOME:  Prior to Admission medications   Medication Sig Start Date End Date Taking? Authorizing Provider  acetaminophen (TYLENOL) 325 MG tablet Take 650 mg by mouth every 4 (four) hours as needed for fever. Maximum dose for 24 hours is 3000 mg from all sources of Acetaminophen/tylenol    Yes [provider]  Alpha-Lipoic Acid 50 MG CAPS Take 1 capsule by mouth daily.   Yes [provider]  amLODipine (NORVASC) 5 MG tablet TAKE 1 TABLET(5 MG) BY MOUTH DAILY 12/20/17  Yes Crecencio Mc, MD  Ascorbic Acid (VITAMIN C) 100 MG tablet Take 100 mg by mouth daily.   Yes [provider]  brimonidine (ALPHAGAN) 0.15 % ophthalmic solution Place 1 drop into both eyes 2 (two) times daily.  08/21/11  Yes [provider]  Calcium-Magnesium-Vitamin D 400-166.7-133.3 MG-MG-UNIT TABS Take 1 tablet by mouth 2 (two) times daily.   Yes [provider]  co-enzyme Q-10 30 MG capsule Take 1 capsule (30 mg total) by mouth daily. 05/26/15  Yes Crecencio Mc, MD  docusate sodium (COLACE) 100 MG capsule Take 2 capsules (200 mg total) by mouth at bedtime. 03/06/18  Yes Crecencio Mc, MD  escitalopram (LEXAPRO) 10 MG tablet Take 10 mg by mouth daily.   Yes [provider]  ferrous sulfate 324 (65 FE) MG TBEC Take 1 tablet by mouth every other day. 06/13/12  Yes Crecencio Mc, MD  fluoruracil [facility]) 0.5 % cream Apply 1 application topically daily.    Yes [provider]  furosemide (LASIX) 20 MG tablet Take 1 tablet (20 mg total) by mouth daily. 06/18/17  Yes Fritzi Mandes, MD  latanoprost (XALATAN) 0.005 % ophthalmic solution Place 1 drop into both eyes at bedtime.  08/13/11  Yes [provider]  Lidocaine (ASPERCREME LIDOCAINE) 4 % PTCH Apply 1 patch topically daily. Apply to base of back/upper right buttock for sciatic pain. Remove patch after 12 hours.   Yes [provider]  losartan (COZAAR) 100 MG tablet Take 1 tablet (100 mg total) by mouth daily. 03/06/18  Yes Crecencio Mc, MD  meclizine (ANTIVERT) 25 MG tablet Take 25 mg by mouth 3 (three) times daily as needed for dizziness.   Yes [provider]  Melatonin 5 MG TABS Take 1 tablet by mouth at bedtime.    Yes [provider]  metoprolol tartrate (LOPRESSOR)  25 MG tablet TAKE 1 TABLET(25 MG) BY MOUTH TWICE DAILY 02/11/18  Yes Crecencio Mc, MD  omeprazole (PRILOSEC) 40 MG capsule TAKE 1 CAPSULE(40 MG) BY MOUTH DAILY 05/12/18  Yes Crecencio Mc, MD  polyethylene glycol (MIRALAX / GLYCOLAX) packet Take 17 g by mouth daily. Mix one tablespoon with 8oz of your favorite juice or water every day until you are having soft formed stools. Then start taking once daily if you didn't have a stool the day before. 11/22/17  Yes Merlyn Lot, MD  Red Yeast Rice 600 MG CAPS Take 1 capsule by mouth daily.   Yes [provider]  RUTIN PO Take 500 mg by mouth daily.   Yes [provider]  tamsulosin (FLOMAX) 0.4 MG CAPS capsule TAKE 1 CAPSULE(0.4 MG) BY MOUTH DAILY  02/24/18  Yes Burnard Hawthorne, FNP  Turmeric 500 MG CAPS Take 1 capsule by mouth daily.   Yes [provider]  warfarin (COUMADIN) 3 MG tablet TAKE 1 TABLET(3 MG) BY MOUTH DAILY FOR 1 DOSE Patient taking differently: Take 1 tablet (3MG ) by mouth daily 05/12/18  Yes Crecencio Mc, MD  terbinafine (LAMISIL) 1 % cream Apply 1 application topically 2 (two) times daily. To affected area on foot Patient not taking: Reported on 05/14/2018 04/14/18   Crecencio Mc, MD  amitriptyline (ELAVIL) 25 MG tablet Take 1 tablet (25 mg total) by mouth at bedtime. 09/21/11 03/04/12  Crecencio Mc, MD      PHYSICAL EXAMINATION:   VITAL SIGNS: Blood pressure (!) 117/53, pulse 64, temperature (!) 97.3 F (36.3 C), temperature source Oral, resp. rate (!) 25, height 5\' 7"  (1.702 m), weight 63.5 kg (140 lb), SpO2 100 %.  GENERAL:  84 y.o.-year-old patient lying in the bed with no acute distress.  EYES: Pupils equal, round, reactive to light and accommodation. No scleral icterus. Extraocular muscles intact.  HEENT: Head atraumatic, normocephalic. Oropharynx : Packing noted NECK:  Supple, no jugular venous distention. No thyroid enlargement, no tenderness.  LUNGS: Normal breath sounds bilaterally,  no wheezing, rales,rhonchi or crepitation. No use of accessory muscles of respiration.  CARDIOVASCULAR: S1, S2 normal. No murmurs, rubs, or gallops.  ABDOMEN: Soft, nontender, nondistended. Bowel sounds present. No organomegaly or mass.  EXTREMITIES: No pedal edema, cyanosis, or clubbing.  NEUROLOGIC: Cranial nerves II through XII are intact. Muscle strength 5/5 in all extremities. Sensation intact. Gait not checked.  PSYCHIATRIC: The patient is alert and oriented x 3.  SKIN: No obvious rash, lesion, or ulcer.   LABORATORY PANEL:   CBC Recent Labs  Lab 05/14/18 0952  WBC 6.3  HGB 8.9*  HCT 26.2*  PLT 190  MCV 102.7*  MCH 34.8*  MCHC 33.9  RDW 13.5  LYMPHSABS 0.5*  MONOABS 0.3  EOSABS 0.0  BASOSABS 0.0   ------------------------------------------------------------------------------------------------------------------  Chemistries  Recent Labs  Lab 05/14/18 0952  NA 137  K 4.1  CL 108  CO2 20*  GLUCOSE 182*  BUN 65*  CREATININE 0.72  CALCIUM 9.0  AST 32  ALT 20  ALKPHOS 41  BILITOT 1.1   ------------------------------------------------------------------------------------------------------------------ estimated creatinine clearance is 55.1 mL/min (by C-G formula based on SCr of 0.72 mg/dL). ------------------------------------------------------------------------------------------------------------------ No results for input(s): TSH, T4TOTAL, T3FREE, THYROIDAB in the last 72 hours.  Invalid input(s): FREET3   Coagulation profile Recent Labs  Lab 05/14/18 0952  INR 2.21   ------------------------------------------------------------------------------------------------------------------- No results for input(s): DDIMER in the last 72 hours. -------------------------------------------------------------------------------------------------------------------  Cardiac Enzymes No results for input(s): CKMB, TROPONINI, MYOGLOBIN in the last 168 hours.  Invalid  input(s): CK ------------------------------------------------------------------------------------------------------------------ Invalid input(s): POCBNP  ---------------------------------------------------------------------------------------------------------------  Urinalysis    Component Value Date/Time   COLORURINE YELLOW 02/21/2018 1210   APPEARANCEUR CLEAR 02/21/2018 1210   LABSPEC 1.010 02/21/2018 1210   PHURINE 6.5 02/21/2018 1210   GLUCOSEU NEGATIVE 02/21/2018 1210   HGBUR NEGATIVE 02/21/2018 1210   BILIRUBINUR NEGATIVE 02/21/2018 1210   KETONESUR NEGATIVE 02/21/2018 1210   PROTEINUR NEGATIVE 06/12/2017 1848   UROBILINOGEN 0.2 02/21/2018 1210   NITRITE NEGATIVE 02/21/2018 1210   LEUKOCYTESUR TRACE (A) 02/21/2018 1210     RADIOLOGY: No results found.  EKG: Orders placed or performed during the hospital encounter of 11/22/17  . ED EKG  . ED EKG  . EKG 12-Lead  . EKG 12-Lead  . EKG 12-Lead  .  EKG 12-Lead    IMPRESSION AND PLAN: 84 year old elderly male patient with history of atrial fibrillation, mitral valve replacement with mechanical valve, hypertension, congestive heart failure, cardiomyopathy status post tooth extraction yesterday with bleeding from the extraction site.  -Jaw hematoma Clot evacuated in the emergency room Oral packing done Patient already received IV vitamin K and tranexamic acid Monitor for any new bleed 1 unit PRBC transfusion if Hemoglobin drops below 7 Monitor hemoglobin hematocrit  -History of mitral valve replacement with mechanical valve Hold Coumadin for tonight in view of GI bleed Follow-up INR  -Hypertension continue oral amlodipine and Lopressor  -DVT prophylaxis sequential compression device to lower extremities  -Congestive heart failure stable  All the records are reviewed and case discussed with ED provider. Management plans discussed with the patient, family and they are in agreement.  CODE STATUS:DNR Code Status  History    Date Active Date Inactive Code Status Order ID Comments User Context   06/12/2017 1531 06/14/2017 1651 DNR [id number]  Fritzi Mandes, MD Inpatient    Questions for Most Recent Historical Code Status (Order [id number])    Question Answer Comment   In the event of cardiac or respiratory ARREST Do not call a "code blue"    In the event of cardiac or respiratory ARREST Do not perform Intubation, CPR, defibrillation or ACLS    In the event of cardiac or respiratory ARREST Use medication by any route, position, wound care, and other measures to relive pain and suffering. May use oxygen, suction and manual treatment of airway obstruction as needed for comfort.        TOTAL TIME TAKING CARE OF THIS PATIENT: 51 minutes.    Saundra Shelling M.D on 05/14/2018 at 12:07 PM  Between 7am to 6pm - Pager - [phone]  After 6pm go to [email or web address] - password [email or web address] [facility]  Office  [phone]  CC: Primary care physician; Crecencio Mc, MD

## 2018-05-14 NOTE — ED Notes (Signed)
Family states pt was to stop taking coumadin x3 prior to extraction

## 2018-05-14 NOTE — Progress Notes (Signed)
Advanced care plan. Purpose of the Encounter: CODE STATUS Parties in Attendance: Patient and family Patient's Decision Capacity: Good Subjective/Patient's story: Presented to the emergency room after bleeding secondary to tooth extraction Objective/Medical story Patient was on Coumadin and tooth was extracted Had jaw hematoma and bleeding  Goals of care determination:  Advance care directives and goals of care discussed with patient and family They do not want any cardiac resuscitation, intubation and ventilator if the need arises Goals met CODE STATUS: DNR Time spent discussing advanced care planning: 16 minutes

## 2018-05-14 NOTE — ED Provider Notes (Signed)
[facility] Emergency Department Provider Note       Time seen: ----------------------------------------- 9:52 AM on 05/14/2018 -----------------------------------------   I have reviewed the triage vital signs and the nursing notes.  HISTORY   Chief Complaint Post-op Problem    HPI Kevin [name] is a 84 y.o. male with a history of atrial fibrillation, cardiomyopathy, congestive heart failure, hypertension, hyperlipidemia and hypertension who presents to the ED for bleeding from his mouth.  Patient reports tooth extraction was done yesterday.  He currently takes Coumadin for atrial fibrillation and due to his pacemaker.  He brushes teeth this morning and called EMS for copious bleeding from the site.  Multiple blood clots and blood was found in the sink and on the bed sheets.  He denies any pain at this time, bruising is noted to his left lower jaw.  Past Medical History:  Diagnosis Date  . Atrial fibrillation ([place])   . Cancer ([place])    melanoma- right ear  . Cardiomyopathy, secondary ([place])   . Chronic headaches started age 6  . Congestive heart failure (CHF) ([place])   . Coronary atherosclerosis of native coronary artery   . DJD (degenerative joint disease)   . Essential hypertension, benign   . GERD (gastroesophageal reflux disease)   . Glaucoma   . History of mitral valve replacement with mechanical valve   . Hyperlipidemia, unspecified   . Hypertension   . Lumbago   . Osteoporosis    secondary to low testoerone  . S/P MVR (mitral valve replacement)   . Sciatica   . Scoliosis     Patient Active Problem List   Diagnosis Date Noted  . Ringworm of foot 04/15/2018  . Hyponatremia 03/08/2018  . Benign prostatic hyperplasia with urinary hesitancy 02/21/2018  . Constipation 12/12/2017  . Large hiatal hernia 12/12/2017  . Encounter for preventive health examination 03/30/2017  . Generalized muscle weakness 07/24/2016  . Major depressive disorder,  recurrent episode ([place]) 01/24/2016  . Vitamin D deficiency 01/24/2016  . Fatigue 01/23/2016  . Benign essential HTN 03/04/2015  . H/O prosthetic heart valve 03/04/2015  . Symptomatic bradycardia 02/21/2015  . Vertigo 02/21/2015  . Exertional dyspnea 06/25/2014  . Cardiomyopathy ([place]) 03/09/2014  . Second degree AV block, Mobitz type I 11/20/2013  . Secondary cardiomyopathy ([place]) 11/18/2013  . Hyperlipidemia 04/27/2013  . Sciatica of right side 03/16/2013  . Other and unspecified hyperlipidemia 03/16/2013  . Anemia, iron deficiency 09/15/2012  . Osteoporosis, senile 09/15/2012  . Atrial flutter ([place]) 06/26/2012  . Depression 12/24/2011  . Hypertension 08/25/2011  . Neuropathy of lower extremity 08/25/2011  . Bradycardia 08/25/2011  . Atrial fibrillation ([place])   . History of mitral valve replacement with mechanical valve   . Long term current use of anticoagulant therapy 08/22/2011    Past Surgical History:  Procedure Laterality Date  . CARDIOVERSION  2013  . CATARACT EXTRACTION, BILATERAL     x 2  . CORONARY ARTERY BYPASS GRAFT  12/2014   coronary artery bypass w/arterial grafts   . HERNIA REPAIR     x 2  . MELANOMA EXCISION     right ear  . MITRAL VALVE REPLACEMENT  1994   [facility]  . NASAL SEPTUM SURGERY  1963  . PERMANENT PACEMAKER INSERTION Left 12/22/2014    Allergies Cymbalta [duloxetine hcl] and Aspirin  Social History Social History   Tobacco Use  . Smoking status: Never Smoker  . Smokeless tobacco: Never Used  Substance Use Topics  .  Alcohol use: No  . Drug use: No   Review of Systems Constitutional: Negative for fever. ENT: Positive for bleeding from his mouth Cardiovascular: Negative for chest pain. Respiratory: Negative for shortness of breath. Gastrointestinal: Negative for abdominal pain, vomiting and diarrhea. Musculoskeletal: Negative for back pain. Skin: Negative for rash. Neurological: Negative for headaches, focal weakness or  numbness.  All systems negative/normal/unremarkable except as stated in the HPI  ____________________________________________   PHYSICAL EXAM:  VITAL SIGNS: ED Triage Vitals  Enc Vitals Group     BP 05/14/18 0949 122/63     Pulse Rate 05/14/18 0949 75     Resp 05/14/18 0949 18     Temp 05/14/18 0949 (!) 97.3 F (36.3 C)     Temp Source 05/14/18 0949 Oral     SpO2 05/14/18 0949 99 %     Weight 05/14/18 0951 140 lb (63.5 kg)     Height 05/14/18 0951 5\' 7"  (1.702 m)     Head Circumference --      Peak Flow --      Pain Score 05/14/18 0950 0     Pain Loc --      Pain Edu? --      Excl. in [place]? --    Constitutional: Alert and oriented. Well appearing and in no distress. Eyes: Conjunctivae are normal. Normal extraocular movements. ENT   Head: Normocephalic and atraumatic.   Nose: No congestion/rhinnorhea.   Mouth/Throat: Active brisk bleeding from a tooth extraction at tooth number 20 site   Neck: No stridor. Cardiovascular: Normal rate, regular rhythm. No murmurs, rubs, or gallops. Respiratory: Normal respiratory effort without tachypnea nor retractions. Breath sounds are clear and equal bilaterally. No wheezes/rales/rhonchi. Gastrointestinal: Soft and nontender. Normal bowel sounds Musculoskeletal: Nontender with normal range of motion in extremities. No lower extremity tenderness nor edema. Neurologic:  Normal speech and language. No gross focal neurologic deficits are appreciated.  Skin:  Skin is warm, dry and intact.  Pallor is noted Psychiatric: Mood and affect are normal. Speech and behavior are normal.  ____________________________________________  ED COURSE:  As part of my medical decision making, I reviewed the following data within the [place] History obtained from family if available, nursing notes, old chart and ekg, as well as notes from prior ED visits. Patient presented for intraoral bleeding, we will assess with labs as  indicated at this time.   Procedures ____________________________________________   LABS (pertinent positives/negatives)  Labs Reviewed  CBC WITH DIFFERENTIAL/PLATELET - Abnormal; Notable for the following components:      Result Value   RBC 2.56 (*)    Hemoglobin 8.9 (*)    HCT 26.2 (*)    MCV 102.7 (*)    MCH 34.8 (*)    Lymphs Abs 0.5 (*)    All other components within normal limits  COMPREHENSIVE METABOLIC PANEL - Abnormal; Notable for the following components:   CO2 20 (*)    Glucose, Bld 182 (*)    BUN 65 (*)    Total Protein 6.2 (*)    All other components within normal limits  PROTIME-INR - Abnormal; Notable for the following components:   Prothrombin Time 24.3 (*)    All other components within normal limits  TYPE AND SCREEN   CRITICAL CARE Performed by: Laurence Aly   Total critical care time: 30 minutes  Critical care time was exclusive of separately billable procedures and treating other patients.  Critical care was necessary to treat or prevent imminent  or life-threatening deterioration.  Critical care was time spent personally by me on the following activities: development of treatment plan with patient and/or surrogate as well as nursing, discussions with consultants, evaluation of patient's response to treatment, examination of patient, obtaining history from patient or surrogate, ordering and performing treatments and interventions, ordering and review of laboratory studies, ordering and review of radiographic studies, pulse oximetry and re-evaluation of patient's condition.  Procedure note: Using suction I was able to visualize the bleeding post extraction site, approximately tooth #20.  We were able to remove all the clot from the gums.  I attempted to pack with and without tranexamic acid.  I injected lidocaine with epinephrine down into the socket and then repacked with gauze soaked with tranexamic acid.  Finally had bleeding controlled.  We did  have to reverse his Coumadin to get the bleeding to stop.  He was given IV vitamin K. ____________________________________________  DIFFERENTIAL DIAGNOSIS   Medication side effect, procedural complication, anemia  FINAL ASSESSMENT AND PLAN  Post tooth extraction bleeding, anemia, Coumadin reversal   Plan: The patient had presented for bleeding after tooth extraction. Patient's labs did reveal a significant drop in his hemoglobin to 8.9.  He is symptomatic from this decrease.  I will order 1 unit of crossmatched specific blood.  Patient will need hospital observation if not 3-day admission.  I will discuss with the hospitalist for admission.  Laurence Aly, MD   Note: This note was generated in part or whole with voice recognition software. Voice recognition is usually quite accurate but there are transcription errors that can and very often do occur. I apologize for any typographical errors that were not detected and corrected.     Earleen Newport, MD 05/14/18 [phone]

## 2018-05-14 NOTE — Care Management Obs Status (Signed)
[facility] NOTIFICATION   Patient Details  Name: Kevin [name] MRN: [id number] Date of Birth: [birth year]   Medicare Observation Status Notification Given:  Yes    Curtina Grills A Markus Casten, RN 05/14/2018, 2:59 PM

## 2018-05-15 LAB — PREPARE RBC (CROSSMATCH)

## 2018-05-15 LAB — BASIC METABOLIC PANEL
Anion gap: 5 (ref 5–15)
BUN: 50 mg/dL — ABNORMAL HIGH (ref 6–20)
CALCIUM: 9.1 mg/dL (ref 8.9–10.3)
CO2: 24 mmol/L (ref 22–32)
Chloride: 111 mmol/L (ref 101–111)
Creatinine, Ser: 0.78 mg/dL (ref 0.61–1.24)
GLUCOSE: 156 mg/dL — AB (ref 65–99)
POTASSIUM: 3.9 mmol/L (ref 3.5–5.1)
Sodium: 140 mmol/L (ref 135–145)

## 2018-05-15 LAB — HEMOGLOBIN AND HEMATOCRIT, BLOOD
HCT: 20 % — ABNORMAL LOW (ref 40.0–52.0)
HCT: 21.9 % — ABNORMAL LOW (ref 40.0–52.0)
HEMATOCRIT: 19.5 % — AB (ref 40.0–52.0)
HEMOGLOBIN: 6.9 g/dL — AB (ref 13.0–18.0)
Hemoglobin: 6.8 g/dL — ABNORMAL LOW (ref 13.0–18.0)
Hemoglobin: 7.6 g/dL — ABNORMAL LOW (ref 13.0–18.0)

## 2018-05-15 LAB — PROTIME-INR
INR: 1.28
Prothrombin Time: 15.9 seconds — ABNORMAL HIGH (ref 11.4–15.2)

## 2018-05-15 LAB — MRSA PCR SCREENING: MRSA BY PCR: NEGATIVE

## 2018-05-15 NOTE — Evaluation (Signed)
Physical Therapy Evaluation Patient Details Name: Kevin [name] MRN: [id number] DOB: 04-Sep-1925 Today's Date: 05/15/2018   History of Present Illness  Pt is a 84 y.o. male presenting to hospital 05/14/18 with bleeding from mouth (s/p tooth extraction 05/13/18).  Pt admitted to hospital with jaw hematoma and s/p 1 unit PRBC's transfusion.  PMH includes a-fib, CHF, htn, scoliosis, sciatic, s/p MVR, CABG, lumbago, CA, cardiomyopathy, hiatal hernia, pacemaker.  Clinical Impression  Prior to hospital admission, pt was modified independent ambulating with 4ww within home and used [facility] in community.  Pt lives at [facility] facility.  Currently pt is CGA with supine to sit, transfers, and ambulating 30 feet with RW.  Limited distance ambulating d/t pt fatigue and generalized weakness.  Pt would benefit from skilled PT to address noted impairments and functional limitations (see below for any additional details).  Upon hospital discharge, currently recommend pt discharge to STR d/t impaired activity tolerance but will monitor pt's status and update discharge recommendations as appropriate.    Follow Up Recommendations SNF    Equipment Recommendations  Rolling walker with 5" wheels    Recommendations for Other Services       Precautions / Restrictions Precautions Precautions: Fall Restrictions Weight Bearing Restrictions: No      Mobility  Bed Mobility Overal bed mobility: Needs Assistance Bed Mobility: Supine to Sit     Supine to sit: Min guard;HOB elevated     General bed mobility comments: increased effort and time for pt to perform; CGA for safety  Transfers Overall transfer level: Needs assistance Equipment used: Rolling walker (2 wheeled) Transfers: Sit to/from Stand Sit to Stand: Min guard         General transfer comment: increased effort and time to stand from bed with RW; increased effort to stand from recliner with RW  Ambulation/Gait Ambulation/Gait  assistance: Min guard Ambulation Distance (Feet): 30 Feet Assistive device: Rolling walker (2 wheeled)   Gait velocity: decreased   General Gait Details: decreased B step length/foot clearance/heelstrike; limited distance d/t fatigue  Stairs            Wheelchair Mobility    Modified Rankin (Stroke Patients Only)       Balance Overall balance assessment: Needs assistance Sitting-balance support: No upper extremity supported;Feet supported Sitting balance-Leahy Scale: Good Sitting balance - Comments: steady sitting reaching within BOS   Standing balance support: Bilateral upper extremity supported Standing balance-Leahy Scale: Poor Standing balance comment: requires B UE support for static standing balance                             Pertinent Vitals/Pain Pain Assessment: 0-10 Pain Score: 5  Pain Location: h/o R thigh pain Pain Descriptors / Indicators: Sore Pain Intervention(s): Limited activity within patient's tolerance;Monitored during session;Repositioned;Other (comment)(pt declined pain medication at this time)  Vitals (HR and O2 on room air) stable and WFL throughout treatment session.    Home Living Family/patient expects to be discharged to:: Other (Comment)                 Additional Comments: [facility] (1st floor level entry apt)    Prior Function Level of Independence: Needs assistance   Gait / Transfers Assistance Needed: Ambulates with 4ww in home and SPC in community.  ADL's / Homemaking Assistance Needed: Has aide 1 day per week for 1 hour to clean home.  Comments: Pt reports 1 fall in past 6  months (getting out of car going up incline and fell).     Hand Dominance        Extremity/Trunk Assessment   Upper Extremity Assessment Upper Extremity Assessment: Generalized weakness    Lower Extremity Assessment Lower Extremity Assessment: Generalized weakness    Cervical / Trunk Assessment Cervical /  Trunk Assessment: Other exceptions Cervical / Trunk Exceptions: H/o scoliosis  Communication   Communication: HOH  Cognition Arousal/Alertness: Awake/alert Behavior During Therapy: WFL for tasks assessed/performed Overall Cognitive Status: Within Functional Limits for tasks assessed                                 General Comments: Increased time to respond at times (complicated d/t HOH)      General Comments General comments (skin integrity, edema, etc.): Pt resting in bed upon PT arrival.  Pt's brother present throughout PT session.    Exercises     Assessment/Plan    PT Assessment Patient needs continued PT services  PT Problem List Decreased strength;Decreased activity tolerance;Decreased balance;Decreased mobility;Pain       PT Treatment Interventions DME instruction;Gait training;Therapeutic activities;Therapeutic exercise;Balance training;Functional mobility training;Patient/family education    PT Goals (Current goals can be found in the Care Plan section)  Acute Rehab PT Goals Patient Stated Goal: to improve strength and walking PT Goal Formulation: With patient Time For Goal Achievement: 05/29/18 Potential to Achieve Goals: Good    Frequency Min 2X/week   Barriers to discharge Decreased caregiver support      Co-evaluation               AM-PAC PT "6 Clicks" Daily Activity  Outcome Measure Difficulty turning over in bed (including adjusting bedclothes, sheets and blankets)?: A Little Difficulty moving from lying on back to sitting on the side of the bed? : A Lot Difficulty sitting down on and standing up from a chair with arms (e.g., wheelchair, bedside commode, etc,.)?: A Lot Help needed moving to and from a bed to chair (including a wheelchair)?: A Little Help needed walking in hospital room?: A Little Help needed climbing 3-5 steps with a railing? : A Lot 6 Click Score: 15    End of Session Equipment Utilized During Treatment: Gait  belt Activity Tolerance: Patient limited by fatigue Patient left: in chair;with call bell/phone within reach;with chair alarm set;with family/visitor present Nurse Communication: Mobility status;Precautions;Other (comment)(Pt's pain status) PT Visit Diagnosis: Other abnormalities of gait and mobility (R26.89);Muscle weakness (generalized) (M62.81);History of falling (Z91.81);Difficulty in walking, not elsewhere classified (R26.2)    Time: 6578-4696 PT Time Calculation (min) (ACUTE ONLY): 32 min   Charges:   PT Evaluation $PT Eval Low Complexity: 1 Low PT Treatments $Therapeutic Activity: 8-22 mins   PT G CodesLeitha Bleak, PT 05/15/18, 5:20 PM [phone]

## 2018-05-15 NOTE — Progress Notes (Signed)
PT Cancellation Note  Patient Details Name: Kevin [name] MRN: [id number] DOB: May 29, 1925   Cancelled Treatment:    Reason Eval/Treat Not Completed: Patient not medically ready.  PT consult received.  Chart reviewed.  Pt's Hgb noted to be decreased to 6.8 this morning.  Discussed this with pt's nurse who reports plan for blood transfusion today.  Per PT guidelines for Hgb <7.1, PT contraindicated.  Will hold PT at this time and re-attempt PT evaluation at a later date/time as medically appropriate (discussed with pt's nurse).  Leitha Bleak, PT 05/15/18, 8:28 AM [phone]

## 2018-05-15 NOTE — Progress Notes (Addendum)
[facility] [place] NAME: Kevin [name]    MR#:  [id number]  DATE OF BIRTH:  [birth year]  SUBJECTIVE:  CHIEF COMPLAINT: Patient is very hard of hearing.  Resting comfortably.  Brother at bedside, no other episodes of bleeding  REVIEW OF SYSTEMS:  CONSTITUTIONAL: No fever, fatigue or weakness.  EYES: No blurred or double vision.  EARS, NOSE, AND THROAT: No tinnitus or ear pain.  RESPIRATORY: No cough, shortness of breath, wheezing or hemoptysis.  CARDIOVASCULAR: No chest pain, orthopnea, edema.  GASTROINTESTINAL: No nausea, vomiting, diarrhea or abdominal pain.  GENITOURINARY: No dysuria, hematuria.  ENDOCRINE: No polyuria, nocturia,  HEMATOLOGY: No anemia, easy bruising or bleeding SKIN: No rash or lesion. MUSCULOSKELETAL: No joint pain or arthritis.   NEUROLOGIC: No tingling, numbness, weakness.  PSYCHIATRY: No anxiety or depression.   DRUG ALLERGIES:   Allergies  Allergen Reactions  . Cymbalta [Duloxetine Hcl] Other (See Comments)    Urinary retention, constipation and insomnia  . Aspirin     Heart operation was told to never take Aspirin    VITALS:  Blood pressure (!) 103/40, pulse 62, temperature 97.8 F (36.6 C), temperature source Oral, resp. rate 12, height 5\' 7"  (1.702 m), weight 63.5 kg (140 lb), SpO2 100 %.  PHYSICAL EXAMINATION:  GENERAL:  84 y.o.-year-old patient lying in the bed with no acute distress.  EYES: Pupils equal, round, reactive to light and accommodation. No scleral icterus. Extraocular muscles intact.  HEENT: Head atraumatic, normocephalic. Oropharynx and nasopharynx clear.  NECK:  Supple, no jugular venous distention. No thyroid enlargement, no tenderness.  LUNGS: Normal breath sounds bilaterally, no wheezing, rales,rhonchi or crepitation. No use of accessory muscles of respiration.  CARDIOVASCULAR: S1, S2 normal. No murmurs, rubs, or gallops.  ABDOMEN: Soft, nontender, nondistended. Bowel sounds  present. No organomegaly or mass.  EXTREMITIES: No pedal edema, cyanosis, or clubbing.  NEUROLOGIC: Cranial nerves II through XII are intact. Muscle strength 5/5 in all extremities. Sensation intact. Gait not checked.  PSYCHIATRIC: The patient is alert and oriented x 3.  SKIN: No obvious rash, lesion, or ulcer.    LABORATORY PANEL:   CBC Recent Labs  Lab 05/14/18 0952  05/15/18 1450  WBC 6.3  --   --   HGB 8.9*   < > 7.6*  HCT 26.2*   < > 21.9*  PLT 190  --   --    < > = values in this interval not displayed.   ------------------------------------------------------------------------------------------------------------------  Chemistries  Recent Labs  Lab 05/14/18 0952 05/15/18 0433  NA 137 140  K 4.1 3.9  CL 108 111  CO2 20* 24  GLUCOSE 182* 156*  BUN 65* 50*  CREATININE 0.72 0.78  CALCIUM 9.0 9.1  AST 32  --   ALT 20  --   ALKPHOS 41  --   BILITOT 1.1  --    ------------------------------------------------------------------------------------------------------------------  Cardiac Enzymes No results for input(s): TROPONINI in the last 168 hours. ------------------------------------------------------------------------------------------------------------------  RADIOLOGY:  No results found.  EKG:   Orders placed or performed during the hospital encounter of 11/22/17  . ED EKG  . ED EKG  . EKG 12-Lead  . EKG 12-Lead  . EKG 12-Lead  . EKG 12-Lead    ASSESSMENT AND PLAN:   84 year old elderly male patient with history of atrial fibrillation, mitral valve replacement with mechanical valve, hypertension, congestive heart failure, cardiomyopathy status post tooth extraction yesterday with bleeding from the extraction site.  -Jaw hematoma Clot  evacuated in the emergency room, hematoma significantly improved Oral packing done, clinically improving Patient already received IV vitamin K and tranexamic acid 1 unit PRBC transfusion if Hemoglobin drops below  7 Monitor hemoglobin hematocrit-hemoglobin 7.6 today after blood transfusion  -History of mitral valve replacement with mechanical valve/A. fib Hold Coumadin for tonight in view of GI bleed Follow-up INR Consult cardiology regarding further Coumadin recommendations  -Hypertension continue oral amlodipine and Lopressor  -DVT prophylaxis sequential compression device to lower extremities  -Congestive heart failure stable       All the records are reviewed and case discussed with Care Management/Social Workerr. Management plans discussed with the patient, family and they are in agreement.  CODE STATUS: dnr   TOTAL TIME TAKING CARE OF THIS PATIENT: 35 minutes.   POSSIBLE D/C IN 1-2 DAYS, DEPENDING ON CLINICAL CONDITION.  Note: This dictation was prepared with Dragon dictation along with smaller phrase technology. Any transcriptional errors that result from this process are unintentional.   Nicholes Mango M.D on 05/15/2018 at 5:13 PM  Between 7am to 6pm - Pager - [phone] After 6pm go to [email or web address] - password [email or web address] [facility]  Office  [phone]  CC: Primary care physician; Crecencio Mc, MD

## 2018-05-15 NOTE — Progress Notes (Signed)
Per MD, start blood transfusion this A.M.

## 2018-05-16 ENCOUNTER — Other Ambulatory Visit: Payer: Self-pay

## 2018-05-16 ENCOUNTER — Encounter
Admission: RE | Admit: 2018-05-16 | Discharge: 2018-05-16 | Disposition: A | Discharge status: Home / Self Care | Payer: No Typology Code available for payment source | Source: Ambulatory Visit | Attending: Internal Medicine | Admitting: Internal Medicine

## 2018-05-16 DIAGNOSIS — I110 Hypertensive heart disease with heart failure: Secondary | ICD-10-CM | POA: Diagnosis present

## 2018-05-16 DIAGNOSIS — K219 Gastro-esophageal reflux disease without esophagitis: Secondary | ICD-10-CM | POA: Diagnosis present

## 2018-05-16 DIAGNOSIS — Z951 Presence of aortocoronary bypass graft: Secondary | ICD-10-CM | POA: Diagnosis not present

## 2018-05-16 DIAGNOSIS — E785 Hyperlipidemia, unspecified: Secondary | ICD-10-CM | POA: Diagnosis present

## 2018-05-16 DIAGNOSIS — Z950 Presence of cardiac pacemaker: Secondary | ICD-10-CM | POA: Diagnosis not present

## 2018-05-16 DIAGNOSIS — I4891 Unspecified atrial fibrillation: Secondary | ICD-10-CM | POA: Diagnosis present

## 2018-05-16 DIAGNOSIS — Y828 Other medical devices associated with adverse incidents: Secondary | ICD-10-CM | POA: Diagnosis present

## 2018-05-16 DIAGNOSIS — I2510 Atherosclerotic heart disease of native coronary artery without angina pectoris: Secondary | ICD-10-CM | POA: Diagnosis present

## 2018-05-16 DIAGNOSIS — S0083XA Contusion of other part of head, initial encounter: Secondary | ICD-10-CM | POA: Diagnosis present

## 2018-05-16 DIAGNOSIS — I480 Paroxysmal atrial fibrillation: Secondary | ICD-10-CM | POA: Insufficient documentation

## 2018-05-16 DIAGNOSIS — Z85820 Personal history of malignant melanoma of skin: Secondary | ICD-10-CM | POA: Diagnosis not present

## 2018-05-16 DIAGNOSIS — M810 Age-related osteoporosis without current pathological fracture: Secondary | ICD-10-CM | POA: Diagnosis present

## 2018-05-16 DIAGNOSIS — K91841 Postprocedural hemorrhage and hematoma of a digestive system organ or structure following other procedure: Secondary | ICD-10-CM | POA: Diagnosis present

## 2018-05-16 DIAGNOSIS — I428 Other cardiomyopathies: Secondary | ICD-10-CM | POA: Diagnosis present

## 2018-05-16 DIAGNOSIS — Y836 Removal of other organ (partial) (total) as the cause of abnormal reaction of the patient, or of later complication, without mention of misadventure at the time of the procedure: Secondary | ICD-10-CM | POA: Diagnosis present

## 2018-05-16 DIAGNOSIS — R791 Abnormal coagulation profile: Secondary | ICD-10-CM | POA: Diagnosis present

## 2018-05-16 DIAGNOSIS — H9190 Unspecified hearing loss, unspecified ear: Secondary | ICD-10-CM | POA: Diagnosis present

## 2018-05-16 DIAGNOSIS — Z952 Presence of prosthetic heart valve: Secondary | ICD-10-CM | POA: Diagnosis not present

## 2018-05-16 DIAGNOSIS — D649 Anemia, unspecified: Secondary | ICD-10-CM | POA: Diagnosis present

## 2018-05-16 DIAGNOSIS — H409 Unspecified glaucoma: Secondary | ICD-10-CM | POA: Diagnosis present

## 2018-05-16 DIAGNOSIS — Z7901 Long term (current) use of anticoagulants: Secondary | ICD-10-CM | POA: Diagnosis not present

## 2018-05-16 DIAGNOSIS — I509 Heart failure, unspecified: Secondary | ICD-10-CM | POA: Diagnosis present

## 2018-05-16 DIAGNOSIS — Z66 Do not resuscitate: Secondary | ICD-10-CM | POA: Diagnosis present

## 2018-05-16 DIAGNOSIS — R58 Hemorrhage, not elsewhere classified: Secondary | ICD-10-CM | POA: Diagnosis present

## 2018-05-16 DIAGNOSIS — D62 Acute posthemorrhagic anemia: Secondary | ICD-10-CM | POA: Diagnosis present

## 2018-05-16 LAB — BASIC METABOLIC PANEL
ANION GAP: 4 — AB (ref 5–15)
BUN: 34 mg/dL — ABNORMAL HIGH (ref 6–20)
CHLORIDE: 107 mmol/L (ref 101–111)
CO2: 26 mmol/L (ref 22–32)
Calcium: 8.7 mg/dL — ABNORMAL LOW (ref 8.9–10.3)
Creatinine, Ser: 0.81 mg/dL (ref 0.61–1.24)
GFR calc non Af Amer: >60 mL/min (ref >60)
Glucose, Bld: 141 mg/dL — ABNORMAL HIGH (ref 65–99)
POTASSIUM: 3.9 mmol/L (ref 3.5–5.1)
Sodium: 137 mmol/L (ref 135–145)

## 2018-05-16 LAB — CBC
HEMATOCRIT: 20.7 % — AB (ref 40.0–52.0)
HEMOGLOBIN: 7.3 g/dL — AB (ref 13.0–18.0)
MCH: 34.7 pg — ABNORMAL HIGH (ref 26.0–34.0)
MCHC: 35.2 g/dL (ref 32.0–36.0)
MCV: 98.6 fL (ref 80.0–100.0)
Platelets: 149 10*3/uL — ABNORMAL LOW (ref 150–440)
RBC: 2.1 MIL/uL — ABNORMAL LOW (ref 4.40–5.90)
RDW: 15 % — ABNORMAL HIGH (ref 11.5–14.5)
WBC: 5.7 10*3/uL (ref 3.8–10.6)

## 2018-05-16 LAB — TYPE AND SCREEN
ABO/RH(D): O POS
Antibody Screen: NEGATIVE
UNIT DIVISION: 0

## 2018-05-16 LAB — BPAM RBC
Blood Product Expiration Date: 201906092359
ISSUE DATE / TIME: 201906060932
Unit Type and Rh: 9500

## 2018-05-16 LAB — PROTIME-INR
INR: 1.05
Prothrombin Time: 13.6 seconds (ref 11.4–15.2)

## 2018-05-16 MED ORDER — SENNOSIDES-DOCUSATE SODIUM 8.6-50 MG PO TABS: 1.0000 | ORAL_TABLET | Freq: Every evening | ORAL | Status: DC | PRN

## 2018-05-16 MED ORDER — ENOXAPARIN SODIUM 80 MG/0.8ML ~~LOC~~ SOLN: 1.0000 mg/kg | Freq: Two times a day (BID) | SUBCUTANEOUS | 0 refills | Status: DC

## 2018-05-16 MED ORDER — WARFARIN - PHYSICIAN DOSING INPATIENT
Freq: Every day | Status: DC
Start: 2018-05-16 — End: 2018-05-16

## 2018-05-16 MED ORDER — WARFARIN SODIUM 3 MG PO TABS
3.0000 mg | ORAL_TABLET | Freq: Every day | ORAL | Status: DC
  Filled 2018-05-16: qty 1

## 2018-05-16 MED ORDER — ENOXAPARIN SODIUM 80 MG/0.8ML ~~LOC~~ SOLN
1.0000 mg/kg | Freq: Two times a day (BID) | SUBCUTANEOUS | Status: DC
  Filled 2018-05-16 (×2): qty 0.8

## 2018-05-16 NOTE — Clinical Social Work Placement (Signed)
   CLINICAL SOCIAL WORK PLACEMENT  NOTE  Date:  05/16/2018  Patient Details  Name: Kevin [name] MRN: [id number] Date of Birth: [birth year]  Clinical Social Work is seeking post-discharge placement for this patient at the [place] level of care (*CSW will initial, date and re-position this form in  chart as items are completed):  Yes   Patient/family provided with [facility] Work Department's list of facilities offering this level of care within the geographic area requested by the patient (or if unable, by the patient's family).  Yes   Patient/family informed of their freedom to choose among providers that offer the needed level of care, that participate in Medicare, Medicaid or managed care program needed by the patient, have an available bed and are willing to accept the patient.  Yes   Patient/family informed of [facility]'s ownership interest in [facility] and [facility], as well as of the fact that they are under no obligation to receive care at these facilities.  PASRR submitted to EDS on       PASRR number received on       Existing PASRR number confirmed on 05/16/18     FL2 transmitted to all facilities in geographic area requested by pt/family on 05/16/18     FL2 transmitted to all facilities within larger geographic area on       Patient informed that his/her managed care company has contracts with or will negotiate with certain facilities, including the following:        Yes   Patient/family informed of bed offers received.  Patient chooses bed at [facility])     Physician recommends and patient chooses bed at [facility])    Patient to be transferred to [facility]) on 05/16/18.  Patient to be transferred to facility by (EMS)     Patient family notified on 05/16/18 of transfer.  Name of family member notified:  (brother: wilard)     PHYSICIAN       Additional Comment:     _______________________________________________ Shela Leff, LCSW 05/16/2018, 11:40 AM

## 2018-05-16 NOTE — NC FL2 (Signed)
[place] LEVEL OF CARE SCREENING TOOL     IDENTIFICATION  Patient Name: Kevin [name] Birthdate: 03/05/1925 Sex: male Admission Date (Current Location): 05/14/2018  [facility] and [place] Number:  Engineering geologist and Address:  [facility], [address], [place], [place] [address]      Provider Number: [id number]  Attending Physician Name and Address:  Nicholes Mango, MD  Relative Name and Phone Number:       Current Level of Care: Hospital Recommended Level of Care: [place] Prior Approval Number:    Date Approved/Denied:   PASRR Number:    Discharge Plan: SNF    Current Diagnoses: Patient Active Problem List   Diagnosis Date Noted  . Anemia 05/16/2018  . Jaw hematoma 05/14/2018  . Ringworm of foot 04/15/2018  . Hyponatremia 03/08/2018  . Benign prostatic hyperplasia with urinary hesitancy 02/21/2018  . Constipation 12/12/2017  . Large hiatal hernia 12/12/2017  . Encounter for preventive health examination 03/30/2017  . Generalized muscle weakness 07/24/2016  . Major depressive disorder, recurrent episode ([place]) 01/24/2016  . Vitamin D deficiency 01/24/2016  . Fatigue 01/23/2016  . Benign essential HTN 03/04/2015  . H/O prosthetic heart valve 03/04/2015  . Symptomatic bradycardia 02/21/2015  . Vertigo 02/21/2015  . Exertional dyspnea 06/25/2014  . Cardiomyopathy ([place]) 03/09/2014  . Second degree AV block, Mobitz type I 11/20/2013  . Secondary cardiomyopathy ([place]) 11/18/2013  . Hyperlipidemia 04/27/2013  . Sciatica of right side 03/16/2013  . Other and unspecified hyperlipidemia 03/16/2013  . Anemia, iron deficiency 09/15/2012  . Osteoporosis, senile 09/15/2012  . Atrial flutter ([place]) 06/26/2012  . Depression 12/24/2011  . Hypertension 08/25/2011  . Neuropathy of lower extremity 08/25/2011  . Bradycardia 08/25/2011  . Atrial fibrillation ([place])   . History of mitral valve replacement with mechanical  valve   . Long term current use of anticoagulant therapy 08/22/2011    Orientation RESPIRATION BLADDER Height & Weight     Self, Place, Situation  Normal Continent Weight: 140 lb (63.5 kg) Height:  5\' 7"  (170.2 cm)  BEHAVIORAL SYMPTOMS/MOOD NEUROLOGICAL BOWEL NUTRITION STATUS  (none) (none) Continent Diet(soft)  AMBULATORY STATUS COMMUNICATION OF NEEDS Skin   Limited Assist Verbally Normal                       Personal Care Assistance Level of Assistance  Bathing, Feeding, Dressing Bathing Assistance: Limited assistance Feeding assistance: Limited assistance Dressing Assistance: Limited assistance     Functional Limitations Info  Sight, Hearing Sight Info: Impaired Hearing Info: Impaired      SPECIAL CARE FACTORS FREQUENCY  PT (By licensed PT)                    Contractures Contractures Info: Not present    Additional Factors Info  Code Status Code Status Info: dnr             Current Medications (05/16/2018):  This is the current hospital active medication list Current Facility-Administered Medications  Medication Dose Route Frequency Provider Last Rate Last Dose  . 0.9 %  sodium chloride infusion  250 mL Intravenous PRN Pyreddy, Reatha Harps, MD      . acetaminophen (TYLENOL) tablet 650 mg  650 mg Oral Q6H PRN Saundra Shelling, MD   650 mg at 05/15/18 1638   Or  . acetaminophen (TYLENOL) suppository 650 mg  650 mg Rectal Q6H PRN Saundra Shelling, MD      . amLODipine (  NORVASC) tablet 5 mg  5 mg Oral Daily Pyreddy, Pavan, MD   5 mg at 05/16/18 1052  . brimonidine (ALPHAGAN) 0.15 % ophthalmic solution 1 drop  1 drop Both Eyes BID Saundra Shelling, MD   1 drop at 05/16/18 1051  . calcium-vitamin D (OSCAL WITH D) 500-200 MG-UNIT per tablet 1 tablet  1 tablet Oral BID Saundra Shelling, MD   1 tablet at 05/16/18 1052   And  . magnesium oxide (MAG-OX) tablet 200 mg  200 mg Oral BID Saundra Shelling, MD   200 mg at 05/16/18 1051  . docusate sodium (COLACE) capsule 200 mg   200 mg Oral QHS Pyreddy, Reatha Harps, MD   200 mg at 05/15/18 2053  . escitalopram (LEXAPRO) tablet 10 mg  10 mg Oral Daily Pyreddy, Pavan, MD   10 mg at 05/16/18 1051  . ferrous sulfate tablet 325 mg  325 mg Oral Wylie Hail, MD   325 mg at 05/16/18 1052  . HYDROcodone-acetaminophen (NORCO/VICODIN) 5-325 MG per tablet 1-2 tablet  1-2 tablet Oral Q4H PRN Saundra Shelling, MD   1 tablet at 05/14/18 2145  . latanoprost (XALATAN) 0.005 % ophthalmic solution 1 drop  1 drop Both Eyes QHS Pyreddy, Reatha Harps, MD   1 drop at 05/15/18 2052  . lidocaine (LIDODERM) 5 % 1 patch  1 patch Transdermal Daily Pyreddy, Reatha Harps, MD   1 patch at 05/16/18 1053  . losartan (COZAAR) tablet 100 mg  100 mg Oral Daily Pyreddy, Pavan, MD   100 mg at 05/16/18 1052  . meclizine (ANTIVERT) tablet 25 mg  25 mg Oral TID PRN Saundra Shelling, MD      . Melatonin TABS 5 mg  1 tablet Oral QHS Saundra Shelling, MD   5 mg at 05/15/18 2054  . metoprolol tartrate (LOPRESSOR) tablet 25 mg  25 mg Oral BID Saundra Shelling, MD   25 mg at 05/16/18 1052  . ondansetron (ZOFRAN) tablet 4 mg  4 mg Oral Q6H PRN Pyreddy, Reatha Harps, MD       Or  . ondansetron (ZOFRAN) injection 4 mg  4 mg Intravenous Q6H PRN Pyreddy, Pavan, MD      . pantoprazole (PROTONIX) EC tablet 40 mg  40 mg Oral Daily Pyreddy, Pavan, MD   40 mg at 05/16/18 1052  . polyethylene glycol (MIRALAX / GLYCOLAX) packet 17 g  17 g Oral Daily Pyreddy, Pavan, MD   17 g at 05/16/18 1053  . senna-docusate (Senokot-S) tablet 1 tablet  1 tablet Oral QHS PRN Pyreddy, Reatha Harps, MD      . sodium chloride flush (NS) 0.9 % injection 3 mL  3 mL Intravenous Q12H Pyreddy, Reatha Harps, MD   3 mL at 05/15/18 2053  . sodium chloride flush (NS) 0.9 % injection 3 mL  3 mL Intravenous PRN Pyreddy, Reatha Harps, MD      . tamsulosin (FLOMAX) capsule 0.4 mg  0.4 mg Oral QPC supper Saundra Shelling, MD   0.4 mg at 05/15/18 1734  . vitamin C (ASCORBIC ACID) tablet 250 mg  250 mg Oral Daily Pyreddy, Pavan, MD   250 mg at 05/16/18 1052  .  warfarin (COUMADIN) tablet 3 mg  3 mg Oral q1800 Teodoro Spray, MD      . Warfarin - Physician Dosing Inpatient   Does not apply [id number] Nicholes Mango, MD         Discharge Medications: Please see discharge summary for a list of discharge medications.  Relevant Imaging Results:  Relevant Lab Results:  Additional Information ss: [id number]  Shela Leff, LCSW

## 2018-05-16 NOTE — Progress Notes (Signed)
Had BM yesterday,does not need enema.

## 2018-05-16 NOTE — Discharge Instructions (Signed)
Follow-up with primary care physician at the facility in 3 days Follow-up with [facility]  cardiology in a week Lovenox bridging until INR is therapeutic-goal INR 2.5-3.5 PT/INR on 05/18/2018

## 2018-05-16 NOTE — Progress Notes (Signed)
Discharge order received. Patient is alert and oriented. Vital signs stable . No signs of acute distress. Discharge instructions given. Patient verbalized understanding. No other issues noted at this time. Report given to receiving RN at [facility].

## 2018-05-16 NOTE — Clinical Social Work Note (Signed)
Clinical Social Work Assessment  Patient Details  Name: Kevin [name] MRN: [id number] Date of Birth: Oct 24, 1925  Date of referral:                  Reason for consult:                   Permission sought to share information with:    Permission granted to share information::     Name::        Agency::     Relationship::     Contact Information:     Housing/Transportation Living arrangements for the past 2 months:    Source of Information:    Patient Interpreter Needed:    Criminal Activity/Legal Involvement Pertinent to Current Situation/Hospitalization:    Significant Relationships:    Lives with:    Do you feel safe going back to the place where you live?    Need for family participation in patient care:     Care giving concerns:  Patient resides at [facility] [place].    Social Worker assessment / plan:  CSW spoke with patient and his brother this morning and they are in agreement with PT recommendations for short term rehab. Patient's was able to verbalize understanding that EMS will mostly likely not be covered but they wish for EMS regardless. Sharyn Lull at [place] is obtaining auth from insurance.  Employment status:    Insurance information:    PT Recommendations:    Information / Referral to community resources:     Patient/Family's Response to care:  Patient in agreement with rehab and is very pleasant and looking forward to going.  Patient/Family's Understanding of and Emotional Response to Diagnosis, Current Treatment, and Prognosis:   Patient is ready to begin rehab so he can return to his independent living situation.  Emotional Assessment Appearance:    Attitude/Demeanor/Rapport:    Affect (typically observed):    Orientation:    Alcohol / Substance use:    Psych involvement (Current and /or in the community):     Discharge Needs  Concerns to be addressed:    Readmission within the last 30 days:    Current discharge risk:     Barriers to Discharge:      Shela Leff, LCSW 05/16/2018, 11:28 AM

## 2018-05-16 NOTE — Consult Note (Signed)
Cardiology Consultation Note    Patient ID: Kevin [name], MRN: [id number], DOB/AGE: Oct 21, 1925 84 y.o. Admit date: 05/14/2018   Date of Consult: 05/16/2018 Primary Physician: Crecencio Mc, MD Primary Cardiologist: Dr. Clayborn Bigness  Chief Complaint: oral bleeding after tooth extraction Reason for Consultation: warfarin dosing  Requesting MD: Dr. Margaretmary Eddy  HPI: Kevin [name] is a 84 y.o. male with history of atrial fibrillation, mitral valve disease s/p [employer] mechanical replacement, history of chf admitted after developing bleeding in his mouth and a hematoma in his jaw after a tooth extraction. He currenly does not appear to be actively bleeding. No chest pain.   Past Medical History:  Diagnosis Date  . Atrial fibrillation ([place])   . Cancer ([place])    melanoma- right ear  . Cardiomyopathy, secondary ([place])   . Chronic headaches started age 62  . Congestive heart failure (CHF) ([place])   . Coronary atherosclerosis of native coronary artery   . DJD (degenerative joint disease)   . Essential hypertension, benign   . GERD (gastroesophageal reflux disease)   . Glaucoma   . History of mitral valve replacement with mechanical valve   . Hyperlipidemia, unspecified   . Hypertension   . Lumbago   . Osteoporosis    secondary to low testoerone  . S/P MVR (mitral valve replacement)   . Sciatica   . Scoliosis       Surgical History:  Past Surgical History:  Procedure Laterality Date  . CARDIOVERSION  2013  . CATARACT EXTRACTION, BILATERAL     x 2  . CORONARY ARTERY BYPASS GRAFT  12/2014   coronary artery bypass w/arterial grafts   . HERNIA REPAIR     x 2  . MELANOMA EXCISION     right ear  . MITRAL VALVE REPLACEMENT  1994   [facility]  . NASAL SEPTUM SURGERY  1963  . PERMANENT PACEMAKER INSERTION Left 12/22/2014     Home Meds: Prior to Admission medications   Medication Sig Start Date End Date Taking? Authorizing Provider  acetaminophen (TYLENOL) 325 MG tablet Take 650 mg by mouth  every 4 (four) hours as needed for fever. Maximum dose for 24 hours is 3000 mg from all sources of Acetaminophen/tylenol   Yes [provider]  Alpha-Lipoic Acid 50 MG CAPS Take 1 capsule by mouth daily.   Yes [provider]  amLODipine (NORVASC) 5 MG tablet TAKE 1 TABLET(5 MG) BY MOUTH DAILY 12/20/17  Yes Crecencio Mc, MD  Ascorbic Acid (VITAMIN C) 100 MG tablet Take 100 mg by mouth daily.   Yes [provider]  brimonidine (ALPHAGAN) 0.15 % ophthalmic solution Place 1 drop into both eyes 2 (two) times daily.  08/21/11  Yes [provider]  Calcium-Magnesium-Vitamin D 400-166.7-133.3 MG-MG-UNIT TABS Take 1 tablet by mouth 2 (two) times daily.   Yes [provider]  co-enzyme Q-10 30 MG capsule Take 1 capsule (30 mg total) by mouth daily. 05/26/15  Yes Crecencio Mc, MD  docusate sodium (COLACE) 100 MG capsule Take 2 capsules (200 mg total) by mouth at bedtime. 03/06/18  Yes Crecencio Mc, MD  escitalopram (LEXAPRO) 10 MG tablet Take 10 mg by mouth daily.   Yes [provider]  ferrous sulfate 324 (65 FE) MG TBEC Take 1 tablet by mouth every other day. 06/13/12  Yes Crecencio Mc, MD  fluoruracil [facility]) 0.5 % cream Apply 1 application topically daily.    Yes [provider]  furosemide (LASIX) 20 MG tablet Take 1 tablet (20 mg total) by mouth daily. 06/18/17  Yes Fritzi Mandes, MD  latanoprost (XALATAN) 0.005 % ophthalmic solution Place 1 drop into both eyes at bedtime.  08/13/11  Yes [provider]  Lidocaine (ASPERCREME LIDOCAINE) 4 % PTCH Apply 1 patch topically daily. Apply to base of back/upper right buttock for sciatic pain. Remove patch after 12 hours.   Yes [provider]  losartan (COZAAR) 100 MG tablet Take 1 tablet (100 mg total) by mouth daily. 03/06/18  Yes Crecencio Mc, MD  meclizine (ANTIVERT) 25 MG tablet Take 25 mg by mouth 3 (three) times daily as needed for dizziness.   Yes [provider]   Melatonin 5 MG TABS Take 1 tablet by mouth at bedtime.    Yes [provider]  metoprolol tartrate (LOPRESSOR) 25 MG tablet TAKE 1 TABLET(25 MG) BY MOUTH TWICE DAILY 02/11/18  Yes Crecencio Mc, MD  omeprazole (PRILOSEC) 40 MG capsule TAKE 1 CAPSULE(40 MG) BY MOUTH DAILY 05/12/18  Yes Crecencio Mc, MD  polyethylene glycol (MIRALAX / GLYCOLAX) packet Take 17 g by mouth daily. Mix one tablespoon with 8oz of your favorite juice or water every day until you are having soft formed stools. Then start taking once daily if you didn't have a stool the day before. 11/22/17  Yes Merlyn Lot, MD  Red Yeast Rice 600 MG CAPS Take 1 capsule by mouth daily.   Yes [provider]  RUTIN PO Take 500 mg by mouth daily.   Yes [provider]  tamsulosin (FLOMAX) 0.4 MG CAPS capsule TAKE 1 CAPSULE(0.4 MG) BY MOUTH DAILY 02/24/18  Yes Arnett, Yvetta Coder, FNP  Turmeric 500 MG CAPS Take 1 capsule by mouth daily.   Yes [provider]  warfarin (COUMADIN) 3 MG tablet TAKE 1 TABLET(3 MG) BY MOUTH DAILY FOR 1 DOSE Patient taking differently: Take 1 tablet (3MG ) by mouth daily 05/12/18  Yes Crecencio Mc, MD  terbinafine (LAMISIL) 1 % cream Apply 1 application topically 2 (two) times daily. To affected area on foot Patient not taking: Reported on 05/14/2018 04/14/18   Crecencio Mc, MD  amitriptyline (ELAVIL) 25 MG tablet Take 1 tablet (25 mg total) by mouth at bedtime. 09/21/11 03/04/12  Crecencio Mc, MD    Inpatient Medications:  . amLODipine  5 mg Oral Daily  . brimonidine  1 drop Both Eyes BID  . calcium-vitamin D  1 tablet Oral BID   And  . magnesium oxide  200 mg Oral BID  . docusate sodium  200 mg Oral QHS  . escitalopram  10 mg Oral Daily  . ferrous sulfate  325 mg Oral QODAY  . latanoprost  1 drop Both Eyes QHS  . lidocaine  1 patch Transdermal Daily  . losartan  100 mg Oral Daily  . Melatonin  1 tablet Oral QHS  . metoprolol tartrate  25 mg Oral BID  .  pantoprazole  40 mg Oral Daily  . polyethylene glycol  17 g Oral Daily  . sodium chloride flush  3 mL Intravenous Q12H  . tamsulosin  0.4 mg Oral QPC supper  . vitamin C  250 mg Oral Daily   . sodium chloride      Allergies:  Allergies  Allergen Reactions  . Cymbalta [Duloxetine Hcl] Other (See Comments)    Urinary retention, constipation and insomnia  . Aspirin     Heart operation was told to never take  Aspirin    Social History   Socioeconomic History  . Marital status: Widowed    Spouse name: Not on file  . Number of children: 0  . Years of education: college degree  . Highest education level: Not on file  Occupational History  . Not on file  Social Needs  . Financial resource strain: Not on file  . Food insecurity:    Worry: Not on file    Inability: Not on file  . Transportation needs:    Medical: Not on file    Non-medical: Not on file  Tobacco Use  . Smoking status: Never Smoker  . Smokeless tobacco: Never Used  Substance and Sexual Activity  . Alcohol use: No  . Drug use: No  . Sexual activity: Not on file  Lifestyle  . Physical activity:    Days per week: Not on file    Minutes per session: Not on file  . Stress: Not on file  Relationships  . Social connections:    Talks on phone: Not on file    Gets together: Not on file    Attends religious service: Not on file    Active member of club or organization: Not on file    Attends meetings of clubs or organizations: Not on file    Relationship status: Not on file  . Intimate partner violence:    Fear of current or ex partner: Not on file    Emotionally abused: Not on file    Physically abused: Not on file    Forced sexual activity: Not on file  Other Topics Concern  . Not on file  Social History Narrative   Exercise; light 3 x week   Lives alone, widowed at [place] History  Problem Relation Age of Onset  . Cancer Father        unknown  . Alzheimer's disease Brother   .  Arrhythmia Brother   . Mental illness Mother      Review of Systems: A 12-system review of systems was performed and is negative except as noted in the HPI.  Labs: No results for input(s): CKTOTAL, CKMB, TROPONINI in the last 72 hours. Lab Results  Component Value Date   WBC 5.7 05/16/2018   HGB 7.3 (L) 05/16/2018   HCT 20.7 (L) 05/16/2018   MCV 98.6 05/16/2018   PLT 149 (L) 05/16/2018    Recent Labs  Lab 05/14/18 0952  05/16/18 0448  NA 137   < > 137  K 4.1   < > 3.9  CL 108   < > 107  CO2 20*   < > 26  BUN 65*   < > 34*  CREATININE 0.72   < > 0.81  CALCIUM 9.0   < > 8.7*  PROT 6.2*  --   --   BILITOT 1.1  --   --   ALKPHOS 41  --   --   ALT 20  --   --   AST 32  --   --   GLUCOSE 182*   < > 141*   < > = values in this interval not displayed.   Lab Results  Component Value Date   CHOL 187 03/06/2018   HDL 66.40 03/06/2018   LDLCALC 104 (H) 03/06/2018   TRIG 81.0 03/06/2018   No results found for: DDIMER  Radiology/Studies:  No results found.  Wt Readings from Last 3 Encounters:  05/14/18 63.5 kg (140 lb)  04/14/18 62.6 kg (138 lb)  03/06/18 63.4 kg (139 lb 12.8 oz)    EKG: nsr with intermitant v pacing.   Physical Exam:  Blood pressure (!) 120/54, pulse 64, temperature (!) 97.5 F (36.4 C), temperature source Oral, resp. rate 16, height 5\' 7"  (1.702 m), weight 63.5 kg (140 lb), SpO2 100 %. Body mass index is 21.93 kg/m. General: Well developed, well nourished, in no acute distress.Ecchymosis over left lower face Head: Normocephalic, atraumatic, sclera non-icteric, no xanthomas, nares are without discharge.  Neck: Negative for carotid bruits. JVD not elevated. Lungs: Clear bilaterally to auscultation without wheezes, rales, or rhonchi. Breathing is unlabored. Heart: RRR with S1 S2. 9-6/2 diastolic murmur Abdomen: Soft, non-tender, non-distended with normoactive bowel sounds. No hepatomegaly. No rebound/guarding. No obvious abdominal masses. Msk:   Strength and tone appear normal for age. Extremities: No clubbing or cyanosis. No edema.  Distal pedal pulses are 2+ and equal bilaterally. Neuro: Alert and oriented X 3. No facial asymmetry. No focal deficit. Moves all extremities spontaneously. Psych:  Responds to questions appropriately with a normal affect.     Assessment and Plan  Pt with history of afib, [employer] prosthetic mitral valve who was adsmitted after developing a hematoma in his left jaw after tooth extraction. Was given vit K in the er and taken off of his warfarin. INR was low therapeutic at time of presentation. Was given transfusion. No further obvious bleeding. Will need resumption of his warfarin at previous dose with inr goal of 2.5 to 3.5 . Will check inr today. May need transient lovenox bridging inf inr remains low due to vitamin k.   Pacer is working normally.   Signed, Teodoro Spray MD 05/16/2018, 9:30 AM Pager: [phone]

## 2018-05-16 NOTE — Discharge Summary (Signed)
[facility] [place] NAME: Kevin [name]    MR#:  [id number]  DATE OF BIRTH:  1925/09/14  DATE OF ADMISSION:  05/14/2018 ADMITTING PHYSICIAN: Saundra Shelling, MD  DATE OF DISCHARGE:  05/16/18   PRIMARY CARE PHYSICIAN: Crecencio Mc, MD    ADMISSION DIAGNOSIS:  Hemorrhage [R58] Bleeding on Coumadin [R58, T45.515A] Symptomatic anemia [D64.9]  DISCHARGE DIAGNOSIS:  Active Problems:   Jaw hematoma   Anemia  History of mitral valve replacement with mechanical heart valve-continue Coumadin and goal INR 2.5-3.5, needs Lovenox bridging in the interim until INR is therapeutic SECONDARY DIAGNOSIS:   Past Medical History:  Diagnosis Date  . Atrial fibrillation ([place])   . Cancer ([place])    melanoma- right ear  . Cardiomyopathy, secondary ([place])   . Chronic headaches started age 22  . Congestive heart failure (CHF) ([place])   . Coronary atherosclerosis of native coronary artery   . DJD (degenerative joint disease)   . Essential hypertension, benign   . GERD (gastroesophageal reflux disease)   . Glaucoma   . History of mitral valve replacement with mechanical valve   . Hyperlipidemia, unspecified   . Hypertension   . Lumbago   . Osteoporosis    secondary to low testoerone  . S/P MVR (mitral valve replacement)   . Sciatica   . Scoliosis     HOSPITAL COURSE:   HISTORY OF PRESENT ILLNESS: Kevin [name]  is a 84 y.o. male with a known history of atrial fibrillation on Coumadin for anticoagulation as outpatient, cardiomyopathy, congestive heart failure, degenerative joint disease, hypertension, GERD, glaucoma, mitral valve replacement with mechanical valve, hypertension, hyperlipidemia, glaucoma had a tooth extraction yesterday patient is a resident of [facility] facility.  Today morning dentist office called to check on him.  Patient had a lot of bleeding from the jaw from where his tooth was extracted since last night.   Family brought him to the emergency room and clot was extracted by suction from the jaw and packing was done orally to control the bleeding.  Patient received IV vitamin K to reverse Coumadin coagulopathy and also tranexamic acid in the emergency room.  Hospitalist service was consulted for further care.    -Jaw hematoma Clot evacuated in the emergency room, hematoma significantly improved Oral packing done, clinically improving Patient already received IV vitamin K and tranexamic acid 1 unit PRBC transfusionif Hemoglobin drops below 7 prn Monitor hemoglobin hematocrit-hemoglobin 7.6 --7.3  today  Patient received 1 unit blood transfusion  -History of mitral valve replacement with mechanical valve/A. Fib Resume Coumadin  tonight  Follow-up INR-goal INR 2.5-3.5  seen by cardiology Dr. Ubaldo Glassing recommending bridging with Lovenox bridging until INR is therapeutic given the mechanical heart valve Follow-up with [facility] cardiology in a week   -Hypertension continue oral amlodipine and Lopressor Cozaar on hold as blood pressure is softer  -DVT prophylaxis sequential compression device to lower extremities  -Congestive heart failure stable, continue Lasix   PT is recommending skilled nursing facility-patient is agreeable Discharged to [place]:   fair  CONSULTS OBTAINED:  Treatment Team:  Teodoro Spray, MD   PROCEDURES evacuation of the jaw hematoma  DRUG ALLERGIES:   Allergies  Allergen Reactions  . Cymbalta [Duloxetine Hcl] Other (See Comments)    Urinary retention, constipation and insomnia  . Aspirin     Heart operation was told to never take Aspirin    DISCHARGE MEDICATIONS:   Allergies as  of 05/16/2018      Reactions   Cymbalta [duloxetine Hcl] Other (See Comments)   Urinary retention, constipation and insomnia   Aspirin    Heart operation was told to never take Aspirin      Medication List    STOP taking these medications    terbinafine 1 % cream Commonly known as:  LAMISIL     TAKE these medications   acetaminophen 325 MG tablet Commonly known as:  TYLENOL Take 650 mg by mouth every 4 (four) hours as needed for fever. Maximum dose for 24 hours is 3000 mg from all sources of Acetaminophen/tylenol   Alpha-Lipoic Acid 50 MG Caps Take 1 capsule by mouth daily.   amLODipine 5 MG tablet Commonly known as:  NORVASC TAKE 1 TABLET(5 MG) BY MOUTH DAILY   ASPERCREME LIDOCAINE 4 % Ptch Generic drug:  Lidocaine Apply 1 patch topically daily. Apply to base of back/upper right buttock for sciatic pain. Remove patch after 12 hours.   brimonidine 0.15 % ophthalmic solution Commonly known as:  ALPHAGAN Place 1 drop into both eyes 2 (two) times daily.   Calcium-Magnesium-Vitamin D 400-166.7-133.3 MG-MG-UNIT Tabs Take 1 tablet by mouth 2 (two) times daily.   co-enzyme Q-10 30 MG capsule Take 1 capsule (30 mg total) by mouth daily.   docusate sodium 100 MG capsule Commonly known as:  COLACE Take 2 capsules (200 mg total) by mouth at bedtime.   enoxaparin 80 MG/0.8ML injection Commonly known as:  LOVENOX Inject 0.65 mLs (65 mg total) into the skin every 12 (twelve) hours. Continue Lovenox 1 mg/kg subcu every 12 hours until INR is therapeutic with Coumadin, goal INR 2.5-3.5   escitalopram 10 MG tablet Commonly known as:  LEXAPRO Take 10 mg by mouth daily.   ferrous sulfate 324 (65 Fe) MG Tbec Take 1 tablet by mouth every other day.   fluoruracil 0.5 % cream Commonly known as:  CARAC Apply 1 application topically daily.   furosemide 20 MG tablet Commonly known as:  LASIX Take 1 tablet (20 mg total) by mouth daily.   latanoprost 0.005 % ophthalmic solution Commonly known as:  XALATAN Place 1 drop into both eyes at bedtime.   losartan 100 MG tablet Commonly known as:  COZAAR Take 1 tablet (100 mg total) by mouth daily.   meclizine 25 MG tablet Commonly known as:  ANTIVERT Take 25 mg by mouth 3  (three) times daily as needed for dizziness.   Melatonin 5 MG Tabs Take 1 tablet by mouth at bedtime.   metoprolol tartrate 25 MG tablet Commonly known as:  LOPRESSOR TAKE 1 TABLET(25 MG) BY MOUTH TWICE DAILY   omeprazole 40 MG capsule Commonly known as:  PRILOSEC TAKE 1 CAPSULE(40 MG) BY MOUTH DAILY   polyethylene glycol packet Commonly known as:  MIRALAX / GLYCOLAX Take 17 g by mouth daily. Mix one tablespoon with 8oz of your favorite juice or water every day until you are having soft formed stools. Then start taking once daily if you didn't have a stool the day before.   Red Yeast Rice 600 MG Caps Take 1 capsule by mouth daily.   RUTIN PO Take 500 mg by mouth daily.   senna-docusate 8.6-50 MG tablet Commonly known as:  Senokot-S Take 1 tablet by mouth at bedtime as needed for mild constipation.   tamsulosin 0.4 MG Caps capsule Commonly known as:  FLOMAX TAKE 1 CAPSULE(0.4 MG) BY MOUTH DAILY   Turmeric 500 MG Caps Take 1 capsule by  mouth daily.   vitamin C 100 MG tablet Take 100 mg by mouth daily.   warfarin 3 MG tablet Commonly known as:  COUMADIN TAKE 1 TABLET(3 MG) BY MOUTH DAILY FOR 1 DOSE What changed:  See the new instructions.        DISCHARGE INSTRUCTIONS:  Follow-up with primary care physician at the facility in 3 days Follow-up with [facility]  cardiology in a week Lovenox bridging until INR is therapeutic-goal INR 2.5-3.5 PT/INR on 05/18/2018   DIET:  Cardiac diet  DISCHARGE CONDITION:  Fair  ACTIVITY:  Activity as tolerated  OXYGEN:  Home Oxygen: No.   Oxygen Delivery: room air  DISCHARGE LOCATION:  home   If you experience worsening of your admission symptoms, develop shortness of breath, life threatening emergency, suicidal or homicidal thoughts you must seek medical attention immediately by calling 911 or calling your MD immediately  if symptoms less severe.  You Must read complete instructions/literature along with all the possible  adverse reactions/side effects for all the Medicines you take and that have been prescribed to you. Take any new Medicines after you have completely understood and accpet all the possible adverse reactions/side effects.   Please note  You were cared for by a hospitalist during your hospital stay. If you have any questions about your discharge medications or the care you received while you were in the hospital after you are discharged, you can call the unit and asked to speak with the hospitalist on call if the hospitalist that took care of you is not available. Once you are discharged, your primary care physician will handle any further medical issues. Please note that NO REFILLS for any discharge medications will be authorized once you are discharged, as it is imperative that you return to your primary care physician (or establish a relationship with a primary care physician if you do not have one) for your aftercare needs so that they can reassess your need for medications and monitor your lab values.     Today  Chief Complaint  Patient presents with  . Post-op Problem   Patient's jaw swelling improved, hematoma is stable Cardiology is recommending to resume Coumadin  ROS:  CONSTITUTIONAL: Denies fevers, chills. Denies any fatigue, weakness.  EYES: Denies blurry vision, double vision, eye pain. EARS, NOSE, THROAT: Denies tinnitus, ear pain, hearing loss. RESPIRATORY: Denies cough, wheeze, shortness of breath.  CARDIOVASCULAR: Denies chest pain, palpitations, edema.  GASTROINTESTINAL: Denies nausea, vomiting, diarrhea, abdominal pain. Denies bright red blood per rectum. GENITOURINARY: Denies dysuria, hematuria. ENDOCRINE: Denies nocturia or thyroid problems. HEMATOLOGIC AND LYMPHATIC: Denies easy bruising or bleeding. SKIN: Denies rash or lesion. MUSCULOSKELETAL: Denies pain in neck, back, shoulder, knees, hips or arthritic symptoms.  NEUROLOGIC: Denies paralysis, paresthesias.   PSYCHIATRIC: Denies anxiety or depressive symptoms.   VITAL SIGNS:  Blood pressure (!) 120/54, pulse 64, temperature (!) 97.5 F (36.4 C), temperature source Oral, resp. rate 16, height 5\' 7"  (1.702 m), weight 63.5 kg (140 lb), SpO2 100 %.  I/O:    Intake/Output Summary (Last 24 hours) at 05/16/2018 1227 Last data filed at 05/16/2018 0455 Gross per 24 hour  Intake 830 ml  Output -  Net 830 ml    PHYSICAL EXAMINATION:  GENERAL:  84 y.o.-year-old patient lying in the bed with no acute distress.  EYES: Pupils equal, round, reactive to light and accommodation. No scleral icterus. Extraocular muscles intact.  HEENT: Head atraumatic, normocephalic. Oropharynx and nasopharynx clear.  NECK:  Supple, no jugular venous distention.  No thyroid enlargement, no tenderness.  LUNGS: Normal breath sounds bilaterally, no wheezing, rales,rhonchi or crepitation. No use of accessory muscles of respiration.  CARDIOVASCULAR: S1, S2 normal. No murmurs, rubs, or gallops.  ABDOMEN: Soft, non-tender, non-distended. Bowel sounds present. No organomegaly or mass.  EXTREMITIES: No pedal edema, cyanosis, or clubbing.  NEUROLOGIC: Cranial nerves II through XII are intact. Muscle strength 5/5 in all extremities. Sensation intact. Gait not checked.  PSYCHIATRIC: The patient is alert and oriented x 3.  SKIN: No obvious rash, lesion, or ulcer.   DATA REVIEW:   CBC Recent Labs  Lab 05/16/18 0448  WBC 5.7  HGB 7.3*  HCT 20.7*  PLT 149*    Chemistries  Recent Labs  Lab 05/14/18 0952  05/16/18 0448  NA 137   < > 137  K 4.1   < > 3.9  CL 108   < > 107  CO2 20*   < > 26  GLUCOSE 182*   < > 141*  BUN 65*   < > 34*  CREATININE 0.72   < > 0.81  CALCIUM 9.0   < > 8.7*  AST 32  --   --   ALT 20  --   --   ALKPHOS 41  --   --   BILITOT 1.1  --   --    < > = values in this interval not displayed.    Cardiac Enzymes No results for input(s): TROPONINI in the last 168 hours.  Microbiology Results   Results for orders placed or performed during the hospital encounter of 05/14/18  MRSA PCR Screening     Status: None   Collection Time: 05/14/18 12:51 PM  Result Value Ref Range Status   MRSA by PCR NEGATIVE NEGATIVE Final    Comment:        The GeneXpert MRSA Assay (FDA approved for NASAL specimens only), is one component of a comprehensive MRSA colonization surveillance program. It is not intended to diagnose MRSA infection nor to guide or monitor treatment for MRSA infections. Performed at [facility], [address] [place], [place] [address]     RADIOLOGY:  No results found.  EKG:   Orders placed or performed during the hospital encounter of 11/22/17  . ED EKG  . ED EKG  . EKG 12-Lead  . EKG 12-Lead  . EKG 12-Lead  . EKG 12-Lead      Management plans discussed with the patient, family and they are in agreement.  CODE STATUS:     Code Status Orders  (From admission, onward)        Start     Ordered   05/14/18 1238  Do not attempt resuscitation (DNR)  Continuous    Question Answer Comment  In the event of cardiac or respiratory ARREST Do not call a "code blue"   In the event of cardiac or respiratory ARREST Do not perform Intubation, CPR, defibrillation or ACLS   In the event of cardiac or respiratory ARREST Use medication by any route, position, wound care, and other measures to relive pain and suffering. May use oxygen, suction and manual treatment of airway obstruction as needed for comfort.      05/14/18 1237    Code Status History    Date Active Date Inactive Code Status Order ID Comments User Context   06/12/2017 1531 06/14/2017 1651 DNR [id number]  Fritzi Mandes, MD Inpatient    Advance Directive Documentation     Most Recent Value  Type of Advance Directive  Living will  Pre-existing out of facility DNR order (yellow form or pink MOST form)  -  "MOST" Form in Place?  -      TOTAL TIME TAKING CARE OF THIS PATIENT: 43  minutes.    Note: This dictation was prepared with Dragon dictation along with smaller phrase technology. Any transcriptional errors that result from this process are unintentional.   @[facility] @  on 05/16/2018 at 12:27 PM  Between 7am to 6pm - Pager - [phone]  After 6pm go to [email or web address] - password [email or web address]  Office  [phone]  CC: Primary care physician; Crecencio Mc, MD

## 2018-05-16 NOTE — Progress Notes (Signed)
Notified of consult. Patient sees Dr. Clayborn Bigness. Advised floor to contact [facility] Cardiology.

## 2018-05-19 ENCOUNTER — Other Ambulatory Visit
Admission: RE | Admit: 2018-05-19 | Discharge: 2018-05-19 | Disposition: A | Discharge status: Home / Self Care | Payer: No Typology Code available for payment source | Source: Ambulatory Visit | Attending: Gerontology | Admitting: Gerontology

## 2018-05-19 DIAGNOSIS — I482 Chronic atrial fibrillation: Secondary | ICD-10-CM | POA: Insufficient documentation

## 2018-05-19 LAB — COMPREHENSIVE METABOLIC PANEL
ALK PHOS: 51 U/L (ref 38–126)
ALT: 29 U/L (ref 17–63)
AST: 36 U/L (ref 15–41)
Albumin: 3.6 g/dL (ref 3.5–5.0)
Anion gap: 7 (ref 5–15)
BILIRUBIN TOTAL: 0.6 mg/dL (ref 0.3–1.2)
BUN: 25 mg/dL — AB (ref 6–20)
CALCIUM: 8.5 mg/dL — AB (ref 8.9–10.3)
CO2: 24 mmol/L (ref 22–32)
Chloride: 103 mmol/L (ref 101–111)
Creatinine, Ser: 0.89 mg/dL (ref 0.61–1.24)
GFR calc non Af Amer: >60 mL/min (ref >60)
Glucose, Bld: 157 mg/dL — ABNORMAL HIGH (ref 65–99)
Potassium: 3.9 mmol/L (ref 3.5–5.1)
Sodium: 134 mmol/L — ABNORMAL LOW (ref 135–145)
TOTAL PROTEIN: 5.8 g/dL — AB (ref 6.5–8.1)

## 2018-05-19 LAB — CBC WITH DIFFERENTIAL/PLATELET
BASOS ABS: 0 10*3/uL (ref 0–0.1)
Basophils Relative: 0 %
Eosinophils Absolute: 0.2 10*3/uL (ref 0–0.7)
Eosinophils Relative: 5 %
HEMATOCRIT: 22.7 % — AB (ref 40.0–52.0)
Hemoglobin: 7.6 g/dL — ABNORMAL LOW (ref 13.0–18.0)
LYMPHS ABS: 0.7 10*3/uL — AB (ref 1.0–3.6)
LYMPHS PCT: 14 %
MCH: 35 pg — AB (ref 26.0–34.0)
MCHC: 33.4 g/dL (ref 32.0–36.0)
MCV: 104.7 fL — AB (ref 80.0–100.0)
Monocytes Absolute: 0.4 10*3/uL (ref 0.2–1.0)
Monocytes Relative: 10 %
NEUTROS ABS: 3.2 10*3/uL (ref 1.4–6.5)
Neutrophils Relative %: 71 %
PLATELETS: 187 10*3/uL (ref 150–440)
RBC: 2.17 MIL/uL — AB (ref 4.40–5.90)
RDW: 15.4 % — ABNORMAL HIGH (ref 11.5–14.5)
WBC: 4.6 10*3/uL (ref 3.8–10.6)

## 2018-05-19 LAB — PROTIME-INR
INR: 1.3
Prothrombin Time: 16.1 seconds — ABNORMAL HIGH (ref 11.4–15.2)

## 2018-05-20 ENCOUNTER — Other Ambulatory Visit
Admission: RE | Admit: 2018-05-20 | Discharge: 2018-05-20 | Disposition: A | Discharge status: Home / Self Care | Payer: No Typology Code available for payment source | Source: Other Acute Inpatient Hospital | Attending: Internal Medicine | Admitting: Internal Medicine

## 2018-05-20 DIAGNOSIS — I4891 Unspecified atrial fibrillation: Secondary | ICD-10-CM | POA: Insufficient documentation

## 2018-05-20 LAB — PROTIME-INR
INR: 1.46
Prothrombin Time: 17.6 seconds — ABNORMAL HIGH (ref 11.4–15.2)

## 2018-05-21 ENCOUNTER — Other Ambulatory Visit
Admission: RE | Admit: 2018-05-21 | Discharge: 2018-05-21 | Disposition: A | Discharge status: Home / Self Care | Payer: No Typology Code available for payment source | Source: Ambulatory Visit | Attending: Internal Medicine | Admitting: Internal Medicine

## 2018-05-21 ENCOUNTER — Telehealth: Payer: Self-pay | Admitting: Internal Medicine

## 2018-05-21 DIAGNOSIS — I4891 Unspecified atrial fibrillation: Secondary | ICD-10-CM | POA: Insufficient documentation

## 2018-05-21 LAB — PROTIME-INR
INR: 1.42
Prothrombin Time: 17.2 seconds — ABNORMAL HIGH (ref 11.4–15.2)

## 2018-05-23 ENCOUNTER — Other Ambulatory Visit
Admission: RE | Admit: 2018-05-23 | Discharge: 2018-05-23 | Disposition: A | Discharge status: Home / Self Care | Payer: No Typology Code available for payment source | Source: Skilled Nursing Facility | Attending: Internal Medicine | Admitting: Internal Medicine

## 2018-05-23 DIAGNOSIS — I4891 Unspecified atrial fibrillation: Secondary | ICD-10-CM | POA: Insufficient documentation

## 2018-05-23 LAB — CBC WITH DIFFERENTIAL/PLATELET
Basophils Absolute: 0 10*3/uL (ref 0–0.1)
Basophils Relative: 1 %
EOS ABS: 0.3 10*3/uL (ref 0–0.7)
EOS PCT: 7 %
HCT: 20.1 % — ABNORMAL LOW (ref 40.0–52.0)
Hemoglobin: 6.8 g/dL — ABNORMAL LOW (ref 13.0–18.0)
LYMPHS ABS: 0.8 10*3/uL — AB (ref 1.0–3.6)
Lymphocytes Relative: 20 %
MCH: 34.3 pg — AB (ref 26.0–34.0)
MCHC: 33.8 g/dL (ref 32.0–36.0)
MCV: 101.6 fL — ABNORMAL HIGH (ref 80.0–100.0)
MONO ABS: 0.5 10*3/uL (ref 0.2–1.0)
Monocytes Relative: 14 %
Neutro Abs: 2.2 10*3/uL (ref 1.4–6.5)
Neutrophils Relative %: 58 %
PLATELETS: 209 10*3/uL (ref 150–440)
RBC: 1.98 MIL/uL — ABNORMAL LOW (ref 4.40–5.90)
RDW: 14.6 % — AB (ref 11.5–14.5)
WBC: 3.8 10*3/uL (ref 3.8–10.6)

## 2018-05-23 LAB — COMPREHENSIVE METABOLIC PANEL
ALBUMIN: 3.6 g/dL (ref 3.5–5.0)
ALK PHOS: 51 U/L (ref 38–126)
ALT: 56 U/L (ref 17–63)
ANION GAP: 7 (ref 5–15)
AST: 51 U/L — ABNORMAL HIGH (ref 15–41)
BILIRUBIN TOTAL: 0.5 mg/dL (ref 0.3–1.2)
BUN: 15 mg/dL (ref 6–20)
CALCIUM: 8.5 mg/dL — AB (ref 8.9–10.3)
CO2: 26 mmol/L (ref 22–32)
Chloride: 102 mmol/L (ref 101–111)
Creatinine, Ser: 0.82 mg/dL (ref 0.61–1.24)
GLUCOSE: 140 mg/dL — AB (ref 65–99)
Potassium: 4 mmol/L (ref 3.5–5.1)
Sodium: 135 mmol/L (ref 135–145)
TOTAL PROTEIN: 5.9 g/dL — AB (ref 6.5–8.1)

## 2018-05-23 LAB — PROTIME-INR
INR: 1.81
PROTHROMBIN TIME: 20.8 s — AB (ref 11.4–15.2)

## 2018-05-24 ENCOUNTER — Other Ambulatory Visit
Admission: RE | Admit: 2018-05-24 | Discharge: 2018-05-24 | Disposition: A | Discharge status: Home / Self Care | Payer: No Typology Code available for payment source | Source: Ambulatory Visit | Attending: Internal Medicine | Admitting: Internal Medicine

## 2018-05-24 DIAGNOSIS — I4891 Unspecified atrial fibrillation: Secondary | ICD-10-CM | POA: Insufficient documentation

## 2018-05-24 LAB — CBC WITH DIFFERENTIAL/PLATELET
Basophils Absolute: 0 10*3/uL (ref 0–0.1)
Basophils Relative: 1 %
Eosinophils Absolute: 0.3 10*3/uL (ref 0–0.7)
Eosinophils Relative: 7 %
HCT: 23 % — ABNORMAL LOW (ref 40.0–52.0)
HEMOGLOBIN: 7.7 g/dL — AB (ref 13.0–18.0)
LYMPHS ABS: 0.9 10*3/uL — AB (ref 1.0–3.6)
LYMPHS PCT: 23 %
MCH: 34.3 pg — AB (ref 26.0–34.0)
MCHC: 33.6 g/dL (ref 32.0–36.0)
MCV: 102.2 fL — AB (ref 80.0–100.0)
Monocytes Absolute: 0.5 10*3/uL (ref 0.2–1.0)
Monocytes Relative: 14 %
NEUTROS PCT: 55 %
Neutro Abs: 2.2 10*3/uL (ref 1.4–6.5)
Platelets: 226 10*3/uL (ref 150–440)
RBC: 2.25 MIL/uL — AB (ref 4.40–5.90)
RDW: 14.5 % (ref 11.5–14.5)
WBC: 3.9 10*3/uL (ref 3.8–10.6)

## 2018-05-26 ENCOUNTER — Ambulatory Visit: Payer: Medicare Other | Admitting: Internal Medicine

## 2018-05-26 ENCOUNTER — Encounter: Payer: Self-pay | Admitting: Internal Medicine

## 2018-05-26 ENCOUNTER — Other Ambulatory Visit
Admission: RE | Admit: 2018-05-26 | Discharge: 2018-05-26 | Disposition: A | Discharge status: Home / Self Care | Payer: Medicare Other | Source: Ambulatory Visit | Attending: Gerontology | Admitting: Gerontology

## 2018-05-26 VITALS — BP 114/58 | HR 80 | Temp 98.2°F | Resp 15 | Ht 67.0 in | Wt 139.1 lb

## 2018-05-26 DIAGNOSIS — D500 Iron deficiency anemia secondary to blood loss (chronic): Secondary | ICD-10-CM

## 2018-05-26 DIAGNOSIS — D649 Anemia, unspecified: Secondary | ICD-10-CM | POA: Insufficient documentation

## 2018-05-26 DIAGNOSIS — D62 Acute posthemorrhagic anemia: Secondary | ICD-10-CM

## 2018-05-26 DIAGNOSIS — C44309 Unspecified malignant neoplasm of skin of other parts of face: Secondary | ICD-10-CM | POA: Diagnosis not present

## 2018-05-26 DIAGNOSIS — I4891 Unspecified atrial fibrillation: Secondary | ICD-10-CM | POA: Insufficient documentation

## 2018-05-26 LAB — CBC WITH DIFFERENTIAL/PLATELET
BASOS ABS: 0 10*3/uL (ref 0–0.1)
BASOS PCT: 1 %
EOS ABS: 0.3 10*3/uL (ref 0–0.7)
EOS PCT: 6 %
HCT: 21.2 % — ABNORMAL LOW (ref 40.0–52.0)
Hemoglobin: 7.1 g/dL — ABNORMAL LOW (ref 13.0–18.0)
LYMPHS PCT: 19 %
Lymphs Abs: 0.8 10*3/uL — ABNORMAL LOW (ref 1.0–3.6)
MCH: 34.1 pg — ABNORMAL HIGH (ref 26.0–34.0)
MCHC: 33.5 g/dL (ref 32.0–36.0)
MCV: 101.9 fL — AB (ref 80.0–100.0)
MONO ABS: 0.5 10*3/uL (ref 0.2–1.0)
Monocytes Relative: 12 %
Neutro Abs: 2.7 10*3/uL (ref 1.4–6.5)
Neutrophils Relative %: 62 %
Platelets: 222 10*3/uL (ref 150–440)
RBC: 2.08 MIL/uL — AB (ref 4.40–5.90)
RDW: 14.9 % — AB (ref 11.5–14.5)
WBC: 4.3 10*3/uL (ref 3.8–10.6)

## 2018-05-26 LAB — IRON AND TIBC
Iron: 61 ug/dL (ref 45–182)
SATURATION RATIOS: 20 % (ref 17.9–39.5)
TIBC: 304 ug/dL (ref 250–450)
UIBC: 243 ug/dL

## 2018-05-26 LAB — PROTIME-INR
INR: 2.24
PROTHROMBIN TIME: 24.6 s — AB (ref 11.4–15.2)

## 2018-05-26 LAB — COMPREHENSIVE METABOLIC PANEL
ALK PHOS: 47 U/L (ref 38–126)
ALT: 53 U/L (ref 17–63)
AST: 45 U/L — ABNORMAL HIGH (ref 15–41)
Albumin: 3.3 g/dL — ABNORMAL LOW (ref 3.5–5.0)
Anion gap: 8 (ref 5–15)
BUN: 13 mg/dL (ref 6–20)
CALCIUM: 8.5 mg/dL — AB (ref 8.9–10.3)
CO2: 25 mmol/L (ref 22–32)
Chloride: 105 mmol/L (ref 101–111)
Creatinine, Ser: 0.87 mg/dL (ref 0.61–1.24)
GFR calc non Af Amer: >60 mL/min (ref >60)
Glucose, Bld: 101 mg/dL — ABNORMAL HIGH (ref 65–99)
POTASSIUM: 3.7 mmol/L (ref 3.5–5.1)
SODIUM: 138 mmol/L (ref 135–145)
TOTAL PROTEIN: 5.4 g/dL — AB (ref 6.5–8.1)
Total Bilirubin: 0.6 mg/dL (ref 0.3–1.2)

## 2018-05-26 LAB — FERRITIN: Ferritin: 53 ng/mL (ref 24–336)

## 2018-05-26 NOTE — Progress Notes (Signed)
Subjective:  Patient ID: Kevin [name], male    DOB: [birth year]  Age: 84 y.o. MRN: [id number]  CC: The primary encounter diagnosis was Acute blood loss as cause of postoperative anemia. Diagnoses of Iron deficiency anemia due to chronic blood loss, Skin cancer of forehead, and Primary cancer of skin of forehead were also pertinent to this visit.  HPI Kevin [name] presents for hospital follow up.  Patient was admitted to [facility] on June 5 with hemorrhage secondary to oral extraction of tooth from left mandible.  His blood loss was significant, requiring clot extractions,  Packing,   and transfusion.  Patient's warfarin was suspended 3 days prior to his procedure but his INR was not checked  /resulted prior to procedure . His INR was eventually resulted at 3.2 and he reported excessive bleeding to his oral surgeon, so he was taken to ER  On June 5. .  A clot was extracted from the jaw and the site was packed. he was  given IV Vitamin K along with tranexamic acid and  was admitted to [facility] for transfusion when his hemoglobin was noted to drop from  Preadmission level of 11.5 to 6.8.   he was apparently  transfused  1 unit pRBC's athough DC summary does not clearly state this. .  Because of his mechanical mitral valve,  He was seen by cardiology during admission and recommendations to bridge with Lovenox until his INR was > 2.5 for 24 hours.  He states that he is is still receiving Lovenox injections at [facility].   He was discharged  on June 7 to [place].  Dr Frazier Richards has been in charge of his care until discharge from SNF  Chart reviewed,  There is one note from June 10.  There is a daily hgb and I can see that his Hgb was  7.3 at discharge from [facility] on June 7 , dropped to 6.8 on Jun 14.  Patient thinks he may have been transfused once discharge but he is very confused about the events that have transpired since his surgery and there is no record of ER visit for transfusion.  Yesterday's hemoglobin  dropped from 7.7 on Jun 15 to 7.1  And his  INR was 2.24. H denies mouth pain and has not noticed any bleeding .   He is still receiving Lovenox injections and his coumadin has been resumed .   He feels generally weak since discharge . Appetite poor.  Per staff at [facility] he was able to walk to dining hall and back today using a walker .  No paperwork accompanied him today .  A call to [name] was made and I spoke with a nursing assistant because the nurse was not available.  She states that blood transfusions do not occur at [facility]  .   .   Patient is more concerned about ongoing and new skin issues. Still c/o ringworm infection on right distal foot.  Also has had progression of skin cancer  on left forehead  And because  he missed his appt with dermatologist Dasher last week,  He can't get in to see him until September.  Using fluorouracil  Cream once daily .        Outpatient Medications Prior to Visit  Medication Sig Dispense Refill  . acetaminophen (TYLENOL) 325 MG tablet Take 650 mg by mouth every 4 (four) hours as needed for fever. Maximum dose for 24 hours is 3000 mg from all sources of Acetaminophen/tylenol    .  Alpha-Lipoic Acid 50 MG CAPS Take 1 capsule by mouth daily.    Marland Kitchen amLODipine (NORVASC) 5 MG tablet TAKE 1 TABLET(5 MG) BY MOUTH DAILY (Patient taking differently: TAKE 1 TABLET(5 MG) BY MOUTH DAILY  pt states he is only take a 1/2 tablet daily.) 30 tablet 5  . Ascorbic Acid (VITAMIN C) 100 MG tablet Take 100 mg by mouth daily.    . brimonidine (ALPHAGAN) 0.15 % ophthalmic solution Place 1 drop into both eyes 2 (two) times daily.     . Calcium-Magnesium-Vitamin D 400-166.7-133.3 MG-MG-UNIT TABS Take 1 tablet by mouth 2 (two) times daily.    Marland Kitchen co-enzyme Q-10 30 MG capsule Take 1 capsule (30 mg total) by mouth daily. 90 capsule 3  . docusate sodium (COLACE) 100 MG capsule Take 2 capsules (200 mg total) by mouth at bedtime. 60 capsule 5  . enoxaparin (LOVENOX) 80 MG/0.8ML  injection Inject 0.65 mLs (65 mg total) into the skin every 12 (twelve) hours. Continue Lovenox 1 mg/kg subcu every 12 hours until INR is therapeutic with Coumadin, goal INR 2.5-3.5 6 Syringe 0  . ferrous sulfate 324 (65 FE) MG TBEC Take 1 tablet by mouth every other day.    . fluoruracil (CARAC) 0.5 % cream Apply 1 application topically daily.     . furosemide (LASIX) 20 MG tablet Take 1 tablet (20 mg total) by mouth daily. 30 tablet 2  . latanoprost (XALATAN) 0.005 % ophthalmic solution Place 1 drop into both eyes at bedtime.     . Lidocaine (ASPERCREME LIDOCAINE) 4 % PTCH Apply 1 patch topically daily. Apply to base of back/upper right buttock for sciatic pain. Remove patch after 12 hours.    Marland Kitchen losartan (COZAAR) 100 MG tablet Take 1 tablet (100 mg total) by mouth daily. 90 tablet 0  . meclizine (ANTIVERT) 25 MG tablet Take 25 mg by mouth 3 (three) times daily as needed for dizziness.    . Melatonin 5 MG TABS Take 1 tablet by mouth at bedtime.     . metoprolol tartrate (LOPRESSOR) 25 MG tablet TAKE 1 TABLET(25 MG) BY MOUTH TWICE DAILY 60 tablet 5  . omeprazole (PRILOSEC) 40 MG capsule TAKE 1 CAPSULE(40 MG) BY MOUTH DAILY (Patient taking differently: TAKE 1 CAPSULE(40 MG) BY MOUTH DAILY pt states he is only taking 1/2 tablet daily) 90 capsule 1  . polyethylene glycol (MIRALAX / GLYCOLAX) packet Take 17 g by mouth daily. Mix one tablespoon with 8oz of your favorite juice or water every day until you are having soft formed stools. Then start taking once daily if you didn't have a stool the day before. 30 each 0  . Red Yeast Rice 600 MG CAPS Take 1 capsule by mouth daily.    Marland Kitchen RUTIN PO Take 500 mg by mouth daily.    Marland Kitchen senna-docusate (SENOKOT-S) 8.6-50 MG tablet Take 1 tablet by mouth at bedtime as needed for mild constipation.    . tamsulosin (FLOMAX) 0.4 MG CAPS capsule TAKE 1 CAPSULE(0.4 MG) BY MOUTH DAILY 90 capsule 0  . Turmeric 500 MG CAPS Take 1 capsule by mouth daily.    Marland Kitchen warfarin (COUMADIN) 3  MG tablet TAKE 1 TABLET(3 MG) BY MOUTH DAILY FOR 1 DOSE (Patient taking differently: Take 1 tablet (3MG ) by mouth daily) 30 tablet 0  . escitalopram (LEXAPRO) 10 MG tablet Take 10 mg by mouth daily.     No facility-administered medications prior to visit.     Review of Systems;  Patient denies headache, fevers,  unintentional weight loss, skin rash, eye pain, sinus congestion and sinus pain, sore throat, dysphagia,  hemoptysis , cough, dyspnea, wheezing, chest pain, palpitations, orthopnea, edema, abdominal pain, nausea, melena, diarrhea, constipation, flank pain, dysuria, hematuria, urinary  Frequency, nocturia, numbness, tingling, seizures,  Focal weakness, Loss of consciousness,  Tremor, insomnia, depression, anxiety, and suicidal ideation.      Objective:  BP (!) 114/58 (BP Location: Left Arm, Patient Position: Sitting, Cuff Size: Normal)   Pulse 80   Temp 98.2 F (36.8 C) (Oral)   Resp 15   Ht 5\' 7"  (1.702 m)   Wt 139 lb 1.9 oz (63.1 kg)   SpO2 94%   BMI 21.79 kg/m   BP Readings from Last 3 Encounters:  05/26/18 (!) 114/58  05/16/18 (!) 100/44  04/14/18 (!) 134/58    Wt Readings from Last 3 Encounters:  05/26/18 139 lb 1.9 oz (63.1 kg)  05/14/18 140 lb (63.5 kg)  04/14/18 138 lb (62.6 kg)    General appearance: alert, cooperative and appears stated age. Seated comfortably in wheelchair  Ears: normal TM's and external ear canals both ears Oral:  Left mandible without stigmata of recent bleeding.   Recent molar extraction siet clean,  No packing,  No drainage  Neck: no adenopathy, no carotid bruit, supple, symmetrical, trachea midline and thyroid not enlarged, symmetric, no tenderness/mass/nodules Back: symmetric, no curvature. ROM normal. No CVA tenderness. Lungs: clear to auscultation bilaterally Heart: regular rate and rhythm, S1, S2 normal, no murmur, click, rub or gallop Abdomen: soft, non-tender; bowel sounds normal; no masses,  no organomegaly Pulses: 2+ and  symmetric Skin: Left forehead covered with hyperpigmented macular lesion which is new per patient.  Right foot with persistent annualr pink lesion at base of toes 3 4 and 5  Lymph nodes: Cervical, supraclavicular, and axillary nodes normal.  No results found for: HGBA1C  Lab Results  Component Value Date   CREATININE 0.87 05/26/2018   CREATININE 0.82 05/23/2018   CREATININE 0.89 05/19/2018    Lab Results  Component Value Date   WBC 4.3 05/26/2018   HGB 7.1 (L) 05/26/2018   HCT 21.2 (L) 05/26/2018   PLT 222 05/26/2018   GLUCOSE 101 (H) 05/26/2018   CHOL 187 03/06/2018   TRIG 81.0 03/06/2018   HDL 66.40 03/06/2018   LDLDIRECT 105.9 03/13/2012   LDLCALC 104 (H) 03/06/2018   ALT 53 05/26/2018   AST 45 (H) 05/26/2018   NA 138 05/26/2018   K 3.7 05/26/2018   CL 105 05/26/2018   CREATININE 0.87 05/26/2018   BUN 13 05/26/2018   CO2 25 05/26/2018   TSH 3.14 03/06/2018   PSA 0.87 02/21/2018   INR 2.24 05/26/2018    No results found.  Assessment & Plan:   Problem List Items Addressed This Visit    Primary cancer of skin of forehead    He had to miss his appointment last week due to hospitalization. His skin cancer on his forehead has progressed rapidly. Continue use of fluorouracil cream.  Urgent referral to Dr Evorn Gong made.       Anemia, iron deficiency    Chronic,  Aggravated by recent blood loss. Not sure why iron studies were done in light of recent transfusion . Studies are normal.  Needs EPO, retic count,  And repeat hgb   Lab Results  Component Value Date   IRON 61 05/26/2018   TIBC 304 05/26/2018   FERRITIN 53 05/26/2018        Acute blood loss  as cause of postoperative anemia - Primary    Secondary to tooth extraction in the setting of supratherapeutic INR.  He continues to have significant symptomatic anemia despite transfusion of one unit during hospitalization.  The source of the ongoing blood loss is unclear as his oral exam shows no recent bleeding.   He  will need transfusion if tomorrow's hgb is < 7.0 , and further workup for source of ongoing loss will need to be considered given his advanced age. FOBT was not done since it will undoubtedly still be positive from the oral hemorrhage.   Lab Results  Component Value Date   WBC 4.3 05/26/2018   HGB 7.1 (L) 05/26/2018   HCT 21.2 (L) 05/26/2018   MCV 101.9 (H) 05/26/2018   PLT 222 05/26/2018        Relevant Orders   Transfuse RBC    Other Visit Diagnoses    Skin cancer of forehead       Relevant Orders   Ambulatory referral to Dermatology    A total of 40 minutes was spent with patient more than half of which was spent in counseling patient on the above mentioned issues , reviewing and explaining recent labs and imaging studies done, and coordination of care.   I am having Kevin [name] maintain his brimonidine, latanoprost, vitamin C, ferrous sulfate, co-enzyme Q-10, meclizine, furosemide, Alpha-Lipoic Acid, Calcium-Magnesium-Vitamin D, Melatonin, Red Yeast Rice, RUTIN PO, Turmeric, Lidocaine, acetaminophen, polyethylene glycol, amLODipine, metoprolol tartrate, tamsulosin, losartan, docusate sodium, fluoruracil, warfarin, omeprazole, escitalopram, senna-docusate, and enoxaparin.  No orders of the defined types were placed in this encounter.   There are no discontinued medications.  Follow-up: Return in about 1 month (around 06/23/2018) for follow up on anemia .   Crecencio Mc, MD

## 2018-05-26 NOTE — Patient Instructions (Addendum)
Your hemoglobin has dropped again,  To 7.1 .   Since you are very weak,  I would like these labs to be rechecked tomorrow and if your hgb is < 7.0 they will send you to the ER for a  a transfusion of Red blood cells to improve your hemoglobin   We will try to get Dr Roosvelt Harps office to schedule you sooner than September for the growing skin  Cancer on your forehead

## 2018-05-27 ENCOUNTER — Other Ambulatory Visit
Admission: RE | Admit: 2018-05-27 | Discharge: 2018-05-27 | Disposition: A | Discharge status: Home / Self Care | Payer: No Typology Code available for payment source | Source: Ambulatory Visit | Attending: Gerontology | Admitting: Gerontology

## 2018-05-27 DIAGNOSIS — E871 Hypo-osmolality and hyponatremia: Secondary | ICD-10-CM | POA: Insufficient documentation

## 2018-05-27 DIAGNOSIS — C44309 Unspecified malignant neoplasm of skin of other parts of face: Secondary | ICD-10-CM | POA: Insufficient documentation

## 2018-05-27 LAB — CBC WITH DIFFERENTIAL/PLATELET
BASOS PCT: 1 %
Basophils Absolute: 0 10*3/uL (ref 0–0.1)
Eosinophils Absolute: 0.1 10*3/uL (ref 0–0.7)
Eosinophils Relative: 2 %
HEMATOCRIT: 23.8 % — AB (ref 40.0–52.0)
Hemoglobin: 7.9 g/dL — ABNORMAL LOW (ref 13.0–18.0)
LYMPHS PCT: 12 %
Lymphs Abs: 0.7 10*3/uL — ABNORMAL LOW (ref 1.0–3.6)
MCH: 34.5 pg — ABNORMAL HIGH (ref 26.0–34.0)
MCHC: 33.2 g/dL (ref 32.0–36.0)
MCV: 103.9 fL — AB (ref 80.0–100.0)
Monocytes Absolute: 0.5 10*3/uL (ref 0.2–1.0)
Monocytes Relative: 8 %
NEUTROS ABS: 4.5 10*3/uL (ref 1.4–6.5)
NEUTROS PCT: 77 %
Platelets: 257 10*3/uL (ref 150–440)
RBC: 2.29 MIL/uL — AB (ref 4.40–5.90)
RDW: 15.3 % — AB (ref 11.5–14.5)
WBC: 5.9 10*3/uL (ref 3.8–10.6)

## 2018-05-27 LAB — PROTIME-INR
INR: 2
Prothrombin Time: 22.5 s — ABNORMAL HIGH (ref 11.4–15.2)

## 2018-05-27 NOTE — Assessment & Plan Note (Signed)
He had to miss his appointment last week due to hospitalization. His skin cancer on his forehead has progressed rapidly. Continue use of fluorouracil cream.  Urgent referral to Dr Evorn Gong made.

## 2018-05-27 NOTE — Assessment & Plan Note (Signed)
Chronic,  Aggravated by recent blood loss. Not sure why iron studies were done in light of recent transfusion . Studies are normal.  Needs EPO, retic count,  And repeat hgb   Lab Results  Component Value Date   IRON 61 05/26/2018   TIBC 304 05/26/2018   FERRITIN 53 05/26/2018

## 2018-05-27 NOTE — Assessment & Plan Note (Signed)
Secondary to tooth extraction in the setting of supratherapeutic INR.  He continues to have significant symptomatic anemia despite transfusion of one unit during hospitalization.  The source of the ongoing blood loss is unclear as his oral exam shows no recent bleeding.   He will need transfusion if tomorrow's hgb is < 7.0 , and further workup for source of ongoing loss will need to be considered given his advanced age. FOBT was not done since it will undoubtedly still be positive from the oral hemorrhage.   Lab Results  Component Value Date   WBC 4.3 05/26/2018   HGB 7.1 (L) 05/26/2018   HCT 21.2 (L) 05/26/2018   MCV 101.9 (H) 05/26/2018   PLT 222 05/26/2018

## 2018-05-29 ENCOUNTER — Telehealth: Payer: Self-pay

## 2018-05-29 ENCOUNTER — Other Ambulatory Visit
Admission: RE | Admit: 2018-05-29 | Discharge: 2018-05-29 | Disposition: A | Discharge status: Home / Self Care | Payer: Medicare Other | Source: Other Acute Inpatient Hospital | Attending: Gerontology | Admitting: Gerontology

## 2018-05-29 DIAGNOSIS — I4891 Unspecified atrial fibrillation: Secondary | ICD-10-CM | POA: Insufficient documentation

## 2018-05-29 LAB — PROTIME-INR
INR: 2.77
Prothrombin Time: 29 seconds — ABNORMAL HIGH (ref 11.4–15.2)

## 2018-05-29 LAB — CBC WITH DIFFERENTIAL/PLATELET
BASOS PCT: 1 %
Basophils Absolute: 0 10*3/uL (ref 0–0.1)
EOS ABS: 0.3 10*3/uL (ref 0–0.7)
EOS PCT: 5 %
HCT: 23 % — ABNORMAL LOW (ref 40.0–52.0)
Hemoglobin: 7.7 g/dL — ABNORMAL LOW (ref 13.0–18.0)
Lymphocytes Relative: 16 %
Lymphs Abs: 0.9 10*3/uL — ABNORMAL LOW (ref 1.0–3.6)
MCH: 35 pg — ABNORMAL HIGH (ref 26.0–34.0)
MCHC: 33.7 g/dL (ref 32.0–36.0)
MCV: 103.8 fL — ABNORMAL HIGH (ref 80.0–100.0)
MONO ABS: 0.6 10*3/uL (ref 0.2–1.0)
Monocytes Relative: 11 %
Neutro Abs: 3.8 10*3/uL (ref 1.4–6.5)
Neutrophils Relative %: 67 %
PLATELETS: 222 10*3/uL (ref 150–440)
RBC: 2.22 MIL/uL — ABNORMAL LOW (ref 4.40–5.90)
RDW: 16.3 % — AB (ref 11.5–14.5)
WBC: 5.7 10*3/uL (ref 3.8–10.6)

## 2018-05-29 NOTE — Telephone Encounter (Signed)
Not sure why pt was referred to endocrinology.

## 2018-05-29 NOTE — Telephone Encounter (Signed)
No longer needed . It was for persistent low sodium level,  Which has resolved.

## 2018-05-29 NOTE — Telephone Encounter (Signed)
Copied from [place] [id number]. Topic: Inquiry >> May 28, 2018 11:34 AM Oliver Pila B wrote: Reason for CRM: Contact pt's Brother (on [place]); they are wanting to know the purpose of the referral for the Endocrine and if the pt is still needing that future appt

## 2018-05-30 ENCOUNTER — Other Ambulatory Visit
Admission: RE | Admit: 2018-05-30 | Discharge: 2018-05-30 | Disposition: A | Discharge status: Home / Self Care | Payer: No Typology Code available for payment source | Source: Skilled Nursing Facility | Attending: Internal Medicine | Admitting: Internal Medicine

## 2018-05-30 NOTE — Telephone Encounter (Signed)
LMTCB with pt's brother(on [place]). PEC may speak with brother.

## 2018-05-30 NOTE — Telephone Encounter (Signed)
Patient's brother Clay Solum returned the call, I advised of the note from Dr. Derrel Nip on 05/29/18, he verbalized understanding.

## 2018-05-31 ENCOUNTER — Other Ambulatory Visit
Admission: RE | Admit: 2018-05-31 | Discharge: 2018-05-31 | Disposition: A | Discharge status: Home / Self Care | Payer: Medicare Other | Source: Ambulatory Visit | Attending: Internal Medicine | Admitting: Internal Medicine

## 2018-05-31 DIAGNOSIS — I4891 Unspecified atrial fibrillation: Secondary | ICD-10-CM | POA: Insufficient documentation

## 2018-05-31 LAB — PROTIME-INR
INR: 2.67
Prothrombin Time: 28.2 seconds — ABNORMAL HIGH (ref 11.4–15.2)

## 2018-05-31 LAB — CBC WITH DIFFERENTIAL/PLATELET
BASOS PCT: 1 %
Basophils Absolute: 0 10*3/uL (ref 0–0.1)
EOS ABS: 0.3 10*3/uL (ref 0–0.7)
EOS PCT: 5 %
HEMATOCRIT: 22.8 % — AB (ref 40.0–52.0)
Hemoglobin: 7.9 g/dL — ABNORMAL LOW (ref 13.0–18.0)
Lymphocytes Relative: 11 %
Lymphs Abs: 0.6 10*3/uL — ABNORMAL LOW (ref 1.0–3.6)
MCH: 36 pg — ABNORMAL HIGH (ref 26.0–34.0)
MCHC: 34.5 g/dL (ref 32.0–36.0)
MCV: 104.2 fL — ABNORMAL HIGH (ref 80.0–100.0)
MONO ABS: 0.6 10*3/uL (ref 0.2–1.0)
MONOS PCT: 12 %
NEUTROS ABS: 3.7 10*3/uL (ref 1.4–6.5)
Neutrophils Relative %: 71 %
PLATELETS: 206 10*3/uL (ref 150–440)
RBC: 2.18 MIL/uL — ABNORMAL LOW (ref 4.40–5.90)
RDW: 17.3 % — AB (ref 11.5–14.5)
WBC: 5.3 10*3/uL (ref 3.8–10.6)

## 2018-06-01 ENCOUNTER — Other Ambulatory Visit
Admission: RE | Admit: 2018-06-01 | Discharge: 2018-06-01 | Disposition: A | Discharge status: Home / Self Care | Payer: No Typology Code available for payment source | Source: Ambulatory Visit | Attending: Internal Medicine | Admitting: Internal Medicine

## 2018-06-01 LAB — PROTIME-INR
INR: 2.37
PROTHROMBIN TIME: 25.7 s — AB (ref 11.4–15.2)

## 2018-06-02 ENCOUNTER — Encounter: Payer: Self-pay | Admitting: Gerontology

## 2018-06-02 ENCOUNTER — Non-Acute Institutional Stay (SKILLED_NURSING_FACILITY): Payer: Medicare Other | Admitting: Gerontology

## 2018-06-02 DIAGNOSIS — D62 Acute posthemorrhagic anemia: Secondary | ICD-10-CM

## 2018-06-02 DIAGNOSIS — M6281 Muscle weakness (generalized): Secondary | ICD-10-CM

## 2018-06-02 NOTE — Progress Notes (Signed)
Location:   [facility] Room Number: [phone] Place of Service:  [phone]) Provider:  Toni Arthurs, NP-C  Crecencio Mc, MD  Patient Care Team: Crecencio Mc, MD as PCP - General (Internal Medicine)  Extended Emergency Contact Information Primary Emergency Contact: Gauna,Willard G Address: [address]          [place], [place] [address] [place] Phone: [phone] Relation: Brother  Code Status:  DNR Goals of care: Advanced Directive information Advanced Directives 06/02/2018  Does Patient Have a Medical Advance Directive? Yes  Type of Advance Directive Out of facility DNR (pink MOST or yellow form)  Does patient want to make changes to medical advance directive? No - Patient declined  Copy of [place] in Chart? -  Would patient like information on creating a medical advance directive? -  Pre-existing out of facility DNR order (yellow form or pink MOST form) Yellow form placed in chart (order not valid for inpatient use)     Chief Complaint  Patient presents with  . Medical Management of Chronic Issues    Routine Visit    HPI:  Pt is a 84 y.o. male seen today for medical management of chronic diseases. Pt was admitted to the facility for rehab for generalized weakness after admission to [facility] with acute blood loss anemia. Pt had significant blood loss after a tooth extraction and received blood transfusions. Pt reports he is feeling better, but still weak and tired feeling. Pt has been participating therapy. Denies pain. Pt has also been having frequent INRs. Pt has not had any abnormal bleeding/ blood loss. Pt reports his appetite is good, voiding well and having regular BMs. VSS. No other complaints.       Past Medical History:  Diagnosis Date  . Atrial fibrillation ([place])   . Cancer ([place])    melanoma- right ear  . Cardiomyopathy, secondary ([place])   . Chronic headaches started age 43  . Congestive heart  failure (CHF) ([place])   . Coronary atherosclerosis of native coronary artery   . DJD (degenerative joint disease)   . Essential hypertension, benign   . GERD (gastroesophageal reflux disease)   . Glaucoma   . History of mitral valve replacement with mechanical valve   . Hyperlipidemia, unspecified   . Hypertension   . Lumbago   . Osteoporosis    secondary to low testoerone  . S/P MVR (mitral valve replacement)   . Sciatica   . Scoliosis    Past Surgical History:  Procedure Laterality Date  . CARDIOVERSION  2013  . CATARACT EXTRACTION, BILATERAL     x 2  . CORONARY ARTERY BYPASS GRAFT  12/2014   coronary artery bypass w/arterial grafts   . HERNIA REPAIR     x 2  . MELANOMA EXCISION     right ear  . MITRAL VALVE REPLACEMENT  1994   [facility]  . NASAL SEPTUM SURGERY  1963  . PERMANENT PACEMAKER INSERTION Left 12/22/2014    Allergies  Allergen Reactions  . Cymbalta [Duloxetine Hcl] Other (See Comments)    Urinary retention, constipation and insomnia  . Aspirin     Heart operation was told to never take Aspirin    Allergies as of 06/02/2018      Reactions   Cymbalta [duloxetine Hcl] Other (See Comments)   Urinary retention, constipation and insomnia   Aspirin    Heart operation was told to never take Aspirin  Medication List        Accurate as of 06/02/18  2:30 PM. Always use your most recent med list.          acetaminophen 325 MG tablet Commonly known as:  TYLENOL Take 650 mg by mouth every 4 (four) hours as needed for fever. Maximum dose for 24 hours is 3000 mg from all sources of Acetaminophen/tylenol   Alpha-Lipoic Acid 50 MG Caps Take 1 capsule by mouth daily.   amLODipine 5 MG tablet Commonly known as:  NORVASC Take 5 mg by mouth daily.   ASPERCREME LIDOCAINE 4 % Ptch Generic drug:  Lidocaine Apply 1 patch topically daily. Apply to base of back/upper right buttock for sciatic pain. Remove patch after 12 hours.   brimonidine 0.15 % ophthalmic  solution Commonly known as:  ALPHAGAN Place 1 drop into both eyes 2 (two) times daily.   Calcium-Magnesium-Vitamin D 600-40-500 MG-MG-UNIT Tb24 Take 1 tablet by mouth 2 (two) times daily.   co-enzyme Q-10 30 MG capsule Take 1 capsule (30 mg total) by mouth daily.   docusate sodium 100 MG capsule Commonly known as:  COLACE Take 2 capsules (200 mg total) by mouth at bedtime.   ENSURE ENLIVE PO Take 1 Bottle by mouth 2 (two) times daily.   escitalopram 10 MG tablet Commonly known as:  LEXAPRO Take 10 mg by mouth daily.   feeding supplement (PRO-STAT SUGAR FREE 64) Liqd Take 30 mLs by mouth 2 (two) times daily.   ferrous sulfate 324 (65 Fe) MG Tbec Take 1 tablet by mouth every other day.   furosemide 20 MG tablet Commonly known as:  LASIX Take 1 tablet (20 mg total) by mouth daily.   latanoprost 0.005 % ophthalmic solution Commonly known as:  XALATAN Place 1 drop into both eyes at bedtime.   losartan 100 MG tablet Commonly known as:  COZAAR Take 1 tablet (100 mg total) by mouth daily.   meclizine 25 MG tablet Commonly known as:  ANTIVERT Take 25 mg by mouth 3 (three) times daily as needed for dizziness.   Melatonin 5 MG Tabs Take 1 tablet by mouth at bedtime.   metoprolol tartrate 25 MG tablet Commonly known as:  LOPRESSOR TAKE 1 TABLET(25 MG) BY MOUTH TWICE DAILY   omeprazole 40 MG capsule Commonly known as:  PRILOSEC Take 40 mg by mouth daily.   polyethylene glycol packet Commonly known as:  MIRALAX / GLYCOLAX Take 17 g by mouth daily. Mix one tablespoon with 8oz of your favorite juice or water every day until you are having soft formed stools. Then start taking once daily if you didn't have a stool the day before.   Red Yeast Rice 600 MG Caps Take 1 capsule by mouth daily.   RUTIN PO Take 500 mg by mouth daily.   senna-docusate 8.6-50 MG tablet Commonly known as:  Senokot-S Take 1 tablet by mouth at bedtime as needed for mild constipation.     tamsulosin 0.4 MG Caps capsule Commonly known as:  FLOMAX TAKE 1 CAPSULE(0.4 MG) BY MOUTH DAILY   Turmeric 500 MG Caps Take 1 capsule by mouth daily.   vitamin C 500 MG tablet Commonly known as:  ASCORBIC ACID Take 500 mg by mouth 3 (three) times daily.   warfarin 4 MG tablet Commonly known as:  COUMADIN Take 4 mg by mouth daily.   warfarin 1 MG tablet Commonly known as:  COUMADIN Take 0.5 mg by mouth daily. Give 1/2 tablet (0.5 mg) in addition  to 4 mg tablet for a total of 4.5 mg       Review of Systems  Constitutional: Positive for fatigue. Negative for activity change, appetite change, chills, diaphoresis and fever.  HENT: Negative for congestion, mouth sores, nosebleeds, postnasal drip, sneezing, sore throat, trouble swallowing and voice change.   Respiratory: Negative for apnea, cough, choking, chest tightness, shortness of breath and wheezing.   Cardiovascular: Negative for chest pain, palpitations and leg swelling.  Gastrointestinal: Negative for abdominal distention, abdominal pain, blood in stool, constipation, diarrhea and nausea.  Genitourinary: Negative for difficulty urinating, dysuria, frequency and urgency.  Musculoskeletal: Negative for back pain, gait problem and myalgias. Arthralgias: typical arthritis.  Skin: Positive for pallor. Negative for color change, rash and wound.  Neurological: Positive for weakness. Negative for dizziness, tremors, syncope, speech difficulty, numbness and headaches.  Psychiatric/Behavioral: Negative for agitation and behavioral problems.  All other systems reviewed and are negative.   Immunization History  Administered Date(s) Administered  . Influenza Split 09/17/2013, 09/23/2014  . Influenza, High Dose Seasonal PF 09/03/2017  . Influenza-Unspecified 09/09/2016, 08/28/2017  . Pneumococcal Polysaccharide-23 06/25/2004  . Tdap 10/14/2014  . Zoster 06/26/2011   Pertinent  Health Maintenance Due  Topic Date Due  . PNA vac Low  Risk Adult (2 of 2 - PCV13) 06/25/2005  . INFLUENZA VACCINE  07/10/2018   Fall Risk  01/21/2017 01/22/2015 02/15/2014  Falls in the past year? No No No   Functional Status Survey:    Vitals:   06/02/18 1358  BP: 135/68  Pulse: 77  Resp: 20  Temp: 97.6 F (36.4 C)  TempSrc: Oral  SpO2: 92%  Weight: 140 lb (63.5 kg)  Height: '5\' 7"'$  (1.702 m)   Body mass index is 21.93 kg/m. Physical Exam  Constitutional: He is oriented to person, place, and time. Vital signs are normal. He appears well-developed and well-nourished. He is active and cooperative. He does not appear ill. No distress.  HENT:  Head: Normocephalic and atraumatic.  Mouth/Throat: Uvula is midline, oropharynx is clear and moist and mucous membranes are normal. Mucous membranes are not pale, not dry and not cyanotic.  Eyes: Pupils are equal, round, and reactive to light. Conjunctivae, EOM and lids are normal.  Neck: Trachea normal, normal range of motion and full passive range of motion without pain. Neck supple. No JVD present. No tracheal deviation, no edema and no erythema present. No thyromegaly present.  Cardiovascular: Normal rate, normal heart sounds, intact distal pulses and normal pulses. An irregular rhythm present. Exam reveals no gallop, no distant heart sounds and no friction rub.  No murmur heard. Pulses:      Dorsalis pedis pulses are 2+ on the right side, and 2+ on the left side.  No edema  Pulmonary/Chest: Effort normal and breath sounds normal. No accessory muscle usage. No respiratory distress. He has no decreased breath sounds. He has no wheezes. He has no rhonchi. He has no rales. He exhibits no tenderness.  Abdominal: Soft. Normal appearance and bowel sounds are normal. He exhibits no distension and no ascites. There is no tenderness.  Musculoskeletal: Normal range of motion. He exhibits no edema or tenderness.  Expected osteoarthritis, stiffness; Bilateral Calves soft, supple. Negative Homan's Sign. B-  pedal pulses equal; generalized weakness  Neurological: He is alert and oriented to person, place, and time. He has normal strength.  Skin: Skin is warm, dry and intact. He is not diaphoretic. No cyanosis. There is pallor. Nails show no clubbing.  Psychiatric: He  has a normal mood and affect. His speech is normal and behavior is normal. Judgment and thought content normal. Cognition and memory are normal.  Nursing note and vitals reviewed.   Labs reviewed: Recent Labs    05/19/18 1322 05/23/18 1313 05/26/18 0600  NA 134* 135 138  K 3.9 4.0 3.7  CL 103 102 105  CO2 _0 GLUCOSE 157* 140* 101*  BUN 25* 15 13  CREATININE 0.89 0.82 0.87  CALCIUM 8.5* 8.5* 8.5*   Recent Labs    05/19/18 1322 05/23/18 1313 05/26/18 0600  AST 36 51* 45*  ALT 29 56 53  ALKPHOS 51 51 47  BILITOT 0.6 0.5 0.6  PROT 5.8* 5.9* 5.4*  ALBUMIN 3.6 3.6 3.3*   Recent Labs    05/27/18 1255 05/29/18 0615 05/31/18 0600  WBC 5.9 5.7 5.3  NEUTROABS 4.5 3.8 3.7  HGB 7.9* 7.7* 7.9*  HCT 23.8* 23.0* 22.8*  MCV 103.9* 103.8* 104.2*  PLT 257 222 206   Lab Results  Component Value Date   TSH 3.14 03/06/2018   No results found for: HGBA1C Lab Results  Component Value Date   CHOL 187 03/06/2018   HDL 66.40 03/06/2018   LDLCALC 104 (H) 03/06/2018   LDLDIRECT 105.9 03/13/2012   TRIG 81.0 03/06/2018   CHOLHDL 3 03/06/2018    Significant Diagnostic Results in last 30 days:  No results found.  Assessment/Plan  Acute blood loss as cause of postoperative anemia  Generalized muscle weakness   Continue PT/OT  Continue exercises as taught by PT/OT  Assist with ADLs as appropriate  Monitor for blood loss  Monitor Vitals for hypotension, etc  Safety precautions  Fall precautions  Family/ staff Communication:   Total Time:  Documentation:  Face to Face:  Family/Phone:   Labs/tests ordered:  Cbc, Met B, INR  Medication list reviewed and assessed for continued appropriateness.  Monthly medication orders reviewed and signed.  Vikki Ports, NP-C Geriatrics [facility] [phone], [place] [address] Cell Phone (Mon-Fri 8am-5pm):  [phone] On Call:  [phone] & follow prompts after 5pm & weekends Office Phone:  [phone] Office Fax:  [phone]

## 2018-06-05 ENCOUNTER — Non-Acute Institutional Stay (SKILLED_NURSING_FACILITY): Payer: Medicare Other | Admitting: Gerontology

## 2018-06-05 ENCOUNTER — Other Ambulatory Visit
Admission: RE | Admit: 2018-06-05 | Discharge: 2018-06-05 | Disposition: A | Discharge status: Home / Self Care | Payer: No Typology Code available for payment source | Source: Ambulatory Visit | Attending: Family Medicine | Admitting: Family Medicine

## 2018-06-05 ENCOUNTER — Encounter: Payer: Self-pay | Admitting: Gerontology

## 2018-06-05 DIAGNOSIS — M6281 Muscle weakness (generalized): Secondary | ICD-10-CM

## 2018-06-05 DIAGNOSIS — I480 Paroxysmal atrial fibrillation: Secondary | ICD-10-CM | POA: Insufficient documentation

## 2018-06-05 DIAGNOSIS — E46 Unspecified protein-calorie malnutrition: Secondary | ICD-10-CM

## 2018-06-05 DIAGNOSIS — D62 Acute posthemorrhagic anemia: Secondary | ICD-10-CM | POA: Diagnosis not present

## 2018-06-05 LAB — BASIC METABOLIC PANEL
Anion gap: 8 (ref 5–15)
BUN: 25 mg/dL — AB (ref 8–23)
CALCIUM: 9 mg/dL (ref 8.9–10.3)
CO2: 25 mmol/L (ref 22–32)
CREATININE: 0.77 mg/dL (ref 0.61–1.24)
Chloride: 102 mmol/L (ref 98–111)
GFR calc Af Amer: >60 mL/min (ref >60)
GFR calc non Af Amer: >60 mL/min (ref >60)
Glucose, Bld: 103 mg/dL — ABNORMAL HIGH (ref 70–99)
Potassium: 4.3 mmol/L (ref 3.5–5.1)
SODIUM: 135 mmol/L (ref 135–145)

## 2018-06-05 LAB — CBC WITH DIFFERENTIAL/PLATELET
BASOS ABS: 0 10*3/uL (ref 0–0.1)
Basophils Relative: 1 %
EOS PCT: 5 %
Eosinophils Absolute: 0.2 10*3/uL (ref 0–0.7)
HCT: 27.7 % — ABNORMAL LOW (ref 40.0–52.0)
HEMOGLOBIN: 9.3 g/dL — AB (ref 13.0–18.0)
LYMPHS PCT: 13 %
Lymphs Abs: 0.6 10*3/uL — ABNORMAL LOW (ref 1.0–3.6)
MCH: 35.9 pg — ABNORMAL HIGH (ref 26.0–34.0)
MCHC: 33.7 g/dL (ref 32.0–36.0)
MCV: 106.5 fL — AB (ref 80.0–100.0)
Monocytes Absolute: 0.6 10*3/uL (ref 0.2–1.0)
Monocytes Relative: 14 %
Neutro Abs: 3 10*3/uL (ref 1.4–6.5)
Neutrophils Relative %: 67 %
PLATELETS: 214 10*3/uL (ref 150–440)
RBC: 2.6 MIL/uL — AB (ref 4.40–5.90)
RDW: 18.2 % — ABNORMAL HIGH (ref 11.5–14.5)
WBC: 4.5 10*3/uL (ref 3.8–10.6)

## 2018-06-05 LAB — PROTIME-INR
INR: 2.48
Prothrombin Time: 26.6 seconds — ABNORMAL HIGH (ref 11.4–15.2)

## 2018-06-05 NOTE — Progress Notes (Signed)
Location:   [facility] Room Number: [phone] Place of Service:  SNF [phone])  Provider: Toni Arthurs, NP-C  PCP: Crecencio Mc, MD Patient Care Team: Crecencio Mc, MD as PCP - General (Internal Medicine)  Extended Emergency Contact Information Primary Emergency Contact: Gershman,Willard G Address: [address]          [place], [place] [address] [place] Phone: [phone] Relation: Brother  Code Status: DNR Goals of care:  Advanced Directive information Advanced Directives 06/05/2018  Does Patient Have a Medical Advance Directive? Yes  Type of Advance Directive Out of facility DNR (pink MOST or yellow form)  Does patient want to make changes to medical advance directive? No - Patient declined  Copy of [place] in Chart? -  Would patient like information on creating a medical advance directive? -  Pre-existing out of facility DNR order (yellow form or pink MOST form) Yellow form placed in chart (order not valid for inpatient use)     Allergies  Allergen Reactions  . Cymbalta [Duloxetine Hcl] Other (See Comments)    Urinary retention, constipation and insomnia  . Aspirin     Heart operation was told to never take Aspirin    Chief Complaint  Patient presents with  . Discharge Note    Discharged from SNF    HPI:  84 y.o. male seen today for discharge evaluation. Pt was admitted to the facility for rehab following hospitalization at [facility] for acute blood loss anemia after tooth extraction leading to generalized weakness. Pt has been participating in PT/OT. Pt denies pain. He reports he continues to be weak and tired, easily fatigued. Educated pt on need to allow for increased rest breaks, more naps if needed, etc. Pt verbalized understanding. INRs have been within therapeutic range. Pt reports he is feeling somewhat better and is ready for discharge home. VSS. No other complaints.      Past Medical History:    Diagnosis Date  . Atrial fibrillation ([place])   . Cancer ([place])    melanoma- right ear  . Cardiomyopathy, secondary ([place])   . Chronic headaches started age 55  . Congestive heart failure (CHF) ([place])   . Coronary atherosclerosis of native coronary artery   . DJD (degenerative joint disease)   . Essential hypertension, benign   . GERD (gastroesophageal reflux disease)   . Glaucoma   . History of mitral valve replacement with mechanical valve   . Hyperlipidemia, unspecified   . Hypertension   . Lumbago   . Osteoporosis    secondary to low testoerone  . S/P MVR (mitral valve replacement)   . Sciatica   . Scoliosis     Past Surgical History:  Procedure Laterality Date  . CARDIOVERSION  2013  . CATARACT EXTRACTION, BILATERAL     x 2  . CORONARY ARTERY BYPASS GRAFT  12/2014   coronary artery bypass w/arterial grafts   . HERNIA REPAIR     x 2  . MELANOMA EXCISION     right ear  . MITRAL VALVE REPLACEMENT  1994   [facility]  . NASAL SEPTUM SURGERY  1963  . PERMANENT PACEMAKER INSERTION Left 12/22/2014      reports that he has never smoked. He has never used smokeless tobacco. He reports that he does not drink alcohol or use drugs. Social History   Socioeconomic History  . Marital status: Widowed    Spouse name: Not on file  .  Number of children: 0  . Years of education: college degree  . Highest education level: Not on file  Occupational History  . Not on file  Social Needs  . Financial resource strain: Not on file  . Food insecurity:    Worry: Not on file    Inability: Not on file  . Transportation needs:    Medical: Not on file    Non-medical: Not on file  Tobacco Use  . Smoking status: Never Smoker  . Smokeless tobacco: Never Used  Substance and Sexual Activity  . Alcohol use: No  . Drug use: No  . Sexual activity: Not on file  Lifestyle  . Physical activity:    Days per week: Not on file    Minutes per session: Not on file  . Stress: Not on file   Relationships  . Social connections:    Talks on phone: Not on file    Gets together: Not on file    Attends religious service: Not on file    Active member of club or organization: Not on file    Attends meetings of clubs or organizations: Not on file    Relationship status: Not on file  . Intimate partner violence:    Fear of current or ex partner: Not on file    Emotionally abused: Not on file    Physically abused: Not on file    Forced sexual activity: Not on file  Other Topics Concern  . Not on file  Social History Narrative   Exercise; light 3 x week   Lives alone, widowed at [place]:    Allergies  Allergen Reactions  . Cymbalta [Duloxetine Hcl] Other (See Comments)    Urinary retention, constipation and insomnia  . Aspirin     Heart operation was told to never take Aspirin    Pertinent  Health Maintenance Due  Topic Date Due  . PNA vac Low Risk Adult (2 of 2 - PCV13) 06/25/2005  . INFLUENZA VACCINE  07/10/2018    Medications: Allergies as of 06/05/2018      Reactions   Cymbalta [duloxetine Hcl] Other (See Comments)   Urinary retention, constipation and insomnia   Aspirin    Heart operation was told to never take Aspirin      Medication List        Accurate as of 06/05/18  1:53 PM. Always use your most recent med list.          acetaminophen 325 MG tablet Commonly known as:  TYLENOL Take 650 mg by mouth every 4 (four) hours as needed for fever. Maximum dose for 24 hours is 3000 mg from all sources of Acetaminophen/tylenol   Alpha-Lipoic Acid 50 MG Caps Take 1 capsule by mouth daily.   amLODipine 5 MG tablet Commonly known as:  NORVASC Take 5 mg by mouth daily.   ASPERCREME LIDOCAINE 4 % Ptch Generic drug:  Lidocaine Apply 1 patch topically daily. Apply to base of back/upper right buttock for sciatic pain. Remove patch after 12 hours.   brimonidine 0.15 % ophthalmic solution Commonly known as:   ALPHAGAN Place 1 drop into both eyes 2 (two) times daily.   Calcium-Magnesium-Vitamin D 600-40-500 MG-MG-UNIT Tb24 Take 1 tablet by mouth 2 (two) times daily.   co-enzyme Q-10 30 MG capsule Take 1 capsule (30 mg total) by mouth daily.   docusate sodium 100 MG capsule Commonly known as:  COLACE Take 2  capsules (200 mg total) by mouth at bedtime.   ENSURE ENLIVE PO Take 1 Bottle by mouth 2 (two) times daily.   escitalopram 10 MG tablet Commonly known as:  LEXAPRO Take 10 mg by mouth daily.   feeding supplement (PRO-STAT SUGAR FREE 64) Liqd Take 30 mLs by mouth 2 (two) times daily.   ferrous sulfate 324 (65 Fe) MG Tbec Take 1 tablet by mouth every other day.   furosemide 20 MG tablet Commonly known as:  LASIX Take 1 tablet (20 mg total) by mouth daily.   latanoprost 0.005 % ophthalmic solution Commonly known as:  XALATAN Place 1 drop into both eyes at bedtime.   losartan 100 MG tablet Commonly known as:  COZAAR Take 1 tablet (100 mg total) by mouth daily.   meclizine 25 MG tablet Commonly known as:  ANTIVERT Take 25 mg by mouth 3 (three) times daily as needed for dizziness.   Melatonin 5 MG Tabs Take 1 tablet by mouth at bedtime.   metoprolol tartrate 25 MG tablet Commonly known as:  LOPRESSOR TAKE 1 TABLET(25 MG) BY MOUTH TWICE DAILY   omeprazole 40 MG capsule Commonly known as:  PRILOSEC Take 40 mg by mouth daily.   polyethylene glycol packet Commonly known as:  MIRALAX / GLYCOLAX Take 17 g by mouth daily. Mix one tablespoon with 8oz of your favorite juice or water every day until you are having soft formed stools. Then start taking once daily if you didn't have a stool the day before.   Red Yeast Rice 600 MG Caps Take 1 capsule by mouth daily.   RUTIN PO Take 500 mg by mouth daily.   senna-docusate 8.6-50 MG tablet Commonly known as:  Senokot-S Take 1 tablet by mouth at bedtime as needed for mild constipation.   tamsulosin 0.4 MG Caps  capsule Commonly known as:  FLOMAX TAKE 1 CAPSULE(0.4 MG) BY MOUTH DAILY   Turmeric 500 MG Caps Take 1 capsule by mouth daily.   vitamin C 500 MG tablet Commonly known as:  ASCORBIC ACID Take 500 mg by mouth 3 (three) times daily.   warfarin 4 MG tablet Commonly known as:  COUMADIN Take 4 mg by mouth daily.   warfarin 1 MG tablet Commonly known as:  COUMADIN Take 0.5 mg by mouth daily. Give 1/2 tablet (0.5 mg) in addition to 4 mg tablet for a total of 4.5 mg       Review of Systems  Constitutional: Positive for fatigue. Negative for activity change, appetite change, chills, diaphoresis and fever.  HENT: Negative for congestion, mouth sores, nosebleeds, postnasal drip, sneezing, sore throat, trouble swallowing and voice change.   Respiratory: Negative for apnea, cough, choking, chest tightness, shortness of breath and wheezing.   Cardiovascular: Negative for chest pain, palpitations and leg swelling.  Gastrointestinal: Negative for abdominal distention, abdominal pain, constipation, diarrhea and nausea.  Genitourinary: Negative for difficulty urinating, dysuria, frequency and urgency.  Musculoskeletal: Positive for gait problem. Negative for back pain and myalgias. Arthralgias: typical arthritis.  Skin: Positive for pallor. Negative for color change, rash and wound.  Neurological: Positive for weakness. Negative for dizziness, tremors, syncope, speech difficulty, numbness and headaches.  Psychiatric/Behavioral: Negative for agitation and behavioral problems.  All other systems reviewed and are negative.   Vitals:   06/05/18 1341  BP: (!) 149/73  Pulse: 64  Resp: 18  Temp: (!) 97.3 F (36.3 C)  TempSrc: Oral  SpO2: 95%  Weight: 142 lb 8 oz (64.6 kg)  Height:  5\' 7"  (1.702 m)   Body mass index is 22.32 kg/m. Physical Exam  Constitutional: He is oriented to person, place, and time. Vital signs are normal. He appears well-developed and well-nourished. He is active and  cooperative. He does not appear ill. No distress.  HENT:  Head: Normocephalic and atraumatic.  Mouth/Throat: Uvula is midline, oropharynx is clear and moist and mucous membranes are normal. Mucous membranes are not pale, not dry and not cyanotic.  Eyes: Pupils are equal, round, and reactive to light. Conjunctivae, EOM and lids are normal.  Neck: Trachea normal, normal range of motion and full passive range of motion without pain. Neck supple. No JVD present. No tracheal deviation, no edema and no erythema present. No thyromegaly present.  Cardiovascular: Normal rate, normal heart sounds, intact distal pulses and normal pulses. An irregular rhythm present. Exam reveals no gallop, no distant heart sounds and no friction rub.  No murmur heard. Pulses:      Dorsalis pedis pulses are 2+ on the right side, and 2+ on the left side.  No edema  Pulmonary/Chest: Effort normal and breath sounds normal. No accessory muscle usage. No respiratory distress. He has no decreased breath sounds. He has no wheezes. He has no rhonchi. He has no rales. He exhibits no tenderness.  Abdominal: Soft. Normal appearance and bowel sounds are normal. He exhibits no distension and no ascites. There is no tenderness.  Musculoskeletal: Normal range of motion. He exhibits no edema or tenderness.  Expected osteoarthritis, stiffness; Bilateral Calves soft, supple. Negative Homan's Sign. B- pedal pulses equal; generalized weakness  Neurological: He is alert and oriented to person, place, and time. He has normal strength.  Skin: Skin is warm, dry and intact. He is not diaphoretic. No cyanosis. There is pallor. Nails show no clubbing.  Psychiatric: He has a normal mood and affect. His speech is normal and behavior is normal. Judgment and thought content normal. Cognition and memory are normal.  Nursing note and vitals reviewed.   Labs reviewed: Basic Metabolic Panel: Recent Labs    05/19/18 1322 05/23/18 1313 05/26/18 0600  NA  134* 135 138  K 3.9 4.0 3.7  CL 103 102 105  CO2 24 26 25   GLUCOSE 157* 140* 101*  BUN 25* 15 13  CREATININE 0.89 0.82 0.87  CALCIUM 8.5* 8.5* 8.5*   Liver Function Tests: Recent Labs    05/19/18 1322 05/23/18 1313 05/26/18 0600  AST 36 51* 45*  ALT 29 56 53  ALKPHOS 51 51 47  BILITOT 0.6 0.5 0.6  PROT 5.8* 5.9* 5.4*  ALBUMIN 3.6 3.6 3.3*   Recent Labs    06/12/17 1203 11/22/17 1048  LIPASE 29 43   No results for input(s): AMMONIA in the last 8760 hours. CBC: Recent Labs    05/27/18 1255 05/29/18 0615 05/31/18 0600  WBC 5.9 5.7 5.3  NEUTROABS 4.5 3.8 3.7  HGB 7.9* 7.7* 7.9*  HCT 23.8* 23.0* 22.8*  MCV 103.9* 103.8* 104.2*  PLT 257 222 206   Cardiac Enzymes: Recent Labs    06/12/17 1203  TROPONINI 0.05*   BNP: Invalid input(s): POCBNP CBG: No results for input(s): GLUCAP in the last 8760 hours.  Procedures and Imaging Studies During Stay: No results found.  Assessment/Plan:    Acute blood loss as cause of postoperative anemia  Generalized muscle weakness  Protein-calorie malnutrition, unspecified severity (HCC)   Continue PT/OT  Continue exercises as taught by PT/OT  Continue Warfarin 4.5 mg po Q Day  Have  INR checked in clinic within a few days, forward results to PCP  Monitor for abnormal bleeding  Continue Ferrous Sulfate 325 mg 1 tablet po TID  Continue Vitamin C 500 mg po TID  Continue ProStat 30 mL PO BID for nutritional supplementation  Continue Ensure Enlive 1 bottle po BID for nutritional supplementation  Follow up with PCP asap after discharge for recheck, medication management  Patient is being discharged with the following home health services: HHPT/OT through [facility]   Patient is being discharged with the following durable medical equipment: none  Patient has been advised to f/u with their PCP in 1-2 weeks to bring them up to date on their rehab stay.  Social services at facility was responsible for  arranging this appointment.  Pt was provided with a 30 day supply of prescriptions for medications and refills must be obtained from their PCP.  For controlled substances, a more limited supply may be provided adequate until PCP appointment only.  Future labs/tests needed:  INR, CBC  Family/ staff Communication:   Total Time:  Documentation:  Face to Face:  Family/Phone:  Vikki Ports, NP-C [facility] [address], [place] [address] Cell Phone (Mon-Fri 8am-5pm):  [phone] On Call:  [phone] & follow prompts after 5pm & weekends Office Phone:  [phone] Office Fax:  [phone]

## 2018-06-07 ENCOUNTER — Other Ambulatory Visit
Admission: RE | Admit: 2018-06-07 | Discharge: 2018-06-07 | Disposition: A | Discharge status: Home / Self Care | Payer: No Typology Code available for payment source | Source: Ambulatory Visit | Attending: Gerontology | Admitting: Gerontology

## 2018-06-07 DIAGNOSIS — I480 Paroxysmal atrial fibrillation: Secondary | ICD-10-CM | POA: Insufficient documentation

## 2018-06-07 LAB — PROTIME-INR
INR: 3.7
Prothrombin Time: 36.4 seconds — ABNORMAL HIGH (ref 11.4–15.2)

## 2018-06-09 ENCOUNTER — Encounter: Payer: Self-pay | Admitting: Internal Medicine

## 2018-06-09 DIAGNOSIS — E46 Unspecified protein-calorie malnutrition: Secondary | ICD-10-CM | POA: Insufficient documentation

## 2018-06-09 LAB — PROTIME-INR: INR: 1.6 — AB (ref 0.9–1.1)

## 2018-06-10 ENCOUNTER — Telehealth: Payer: Self-pay

## 2018-06-10 NOTE — Telephone Encounter (Signed)
INR result 1.6. Result has been placed in yellow result folder.

## 2018-06-10 NOTE — Telephone Encounter (Signed)
Ow much has he been taking and when did he start back?

## 2018-06-11 NOTE — Telephone Encounter (Signed)
Patient taking 4 mg alternates 3 mg the next day, patient started the coumadin back on 05/19/18 per patient.

## 2018-06-11 NOTE — Telephone Encounter (Signed)
Increase dose to 4 mg daily and repeat INR in one week

## 2018-06-11 NOTE — Telephone Encounter (Signed)
Spoke with pt and informed him of his coumadin dose change and that he needs to check it again in one week. Pt gave a verbal understanding.

## 2018-06-11 NOTE — Telephone Encounter (Signed)
Attempted to call pt. Number is busy.  

## 2018-06-17 ENCOUNTER — Encounter
Admission: RE | Admit: 2018-06-17 | Discharge: 2018-06-17 | Disposition: A | Discharge status: Home / Self Care | Payer: No Typology Code available for payment source | Source: Ambulatory Visit | Attending: Internal Medicine | Admitting: Internal Medicine

## 2018-06-17 DIAGNOSIS — I480 Paroxysmal atrial fibrillation: Secondary | ICD-10-CM | POA: Insufficient documentation

## 2018-06-18 ENCOUNTER — Telehealth: Payer: Self-pay

## 2018-06-18 NOTE — Telephone Encounter (Signed)
I do not have an opening tomorrow but I agree that he needs to be seen  Can Almyra Free see him?

## 2018-06-18 NOTE — Telephone Encounter (Signed)
Copied from [place] [id number]. Topic: Inquiry >> Jun 18, 2018  8:13 AM Conception Chancy, NT wrote: Reason for CRM: patient brother Percell Miller is calling and states the nurse at the assisted living facility he is at was trying to reach the office multiple times yesterday in regards to the patient being more confused yesterday. They would like for Dr. Derrel Nip to work him in and see him as soon as possible. Mr. Gee would like a call back.

## 2018-06-18 NOTE — Telephone Encounter (Signed)
LMTCB with pt's brother, Lesli Albee. Please transfer to our office.

## 2018-06-18 NOTE — Telephone Encounter (Signed)
Can we work pt in tomorrow? According to pt's brother [name] has tried to contact us several times in regards to trying to get the pt worked in for increased confusion. This is the first message received about this.

## 2018-06-19 ENCOUNTER — Encounter: Payer: Self-pay | Admitting: Internal Medicine

## 2018-06-19 ENCOUNTER — Ambulatory Visit: Payer: Medicare Other | Admitting: Internal Medicine

## 2018-06-19 VITALS — BP 128/66 | HR 65 | Resp 15 | Ht 67.0 in | Wt 129.4 lb

## 2018-06-19 DIAGNOSIS — D500 Iron deficiency anemia secondary to blood loss (chronic): Secondary | ICD-10-CM

## 2018-06-19 DIAGNOSIS — R410 Disorientation, unspecified: Secondary | ICD-10-CM

## 2018-06-19 DIAGNOSIS — Z7901 Long term (current) use of anticoagulants: Secondary | ICD-10-CM

## 2018-06-19 DIAGNOSIS — R4189 Other symptoms and signs involving cognitive functions and awareness: Secondary | ICD-10-CM | POA: Diagnosis not present

## 2018-06-19 DIAGNOSIS — D62 Acute posthemorrhagic anemia: Secondary | ICD-10-CM | POA: Diagnosis not present

## 2018-06-19 LAB — CBC WITH DIFFERENTIAL/PLATELET
BASOS PCT: 0.9 % (ref 0.0–3.0)
Basophils Absolute: 0 10*3/uL (ref 0.0–0.1)
EOS PCT: 4.8 % (ref 0.0–5.0)
Eosinophils Absolute: 0.2 10*3/uL (ref 0.0–0.7)
HCT: 32 % — ABNORMAL LOW (ref 39.0–52.0)
HEMOGLOBIN: 10.9 g/dL — AB (ref 13.0–17.0)
LYMPHS PCT: 17.2 % (ref 12.0–46.0)
Lymphs Abs: 0.7 10*3/uL (ref 0.7–4.0)
MCHC: 34.2 g/dL (ref 30.0–36.0)
MCV: 103 fl — AB (ref 78.0–100.0)
MONOS PCT: 15 % — AB (ref 3.0–12.0)
Monocytes Absolute: 0.6 10*3/uL (ref 0.1–1.0)
NEUTROS ABS: 2.7 10*3/uL (ref 1.4–7.7)
Neutrophils Relative %: 62.1 % (ref 43.0–77.0)
Platelets: 205 10*3/uL (ref 150.0–400.0)
RBC: 3.1 Mil/uL — ABNORMAL LOW (ref 4.22–5.81)
RDW: 15 % (ref 11.5–15.5)
WBC: 4.3 10*3/uL (ref 4.0–10.5)

## 2018-06-19 LAB — POCT URINALYSIS DIPSTICK
BILIRUBIN UA: NEGATIVE
Glucose, UA: NEGATIVE
KETONES UA: NEGATIVE
Leukocytes, UA: NEGATIVE
NITRITE UA: NEGATIVE
PH UA: 6 (ref 5.0–8.0)
PROTEIN UA: NEGATIVE
RBC UA: NEGATIVE
Spec Grav, UA: 1.02 (ref 1.010–1.025)
UROBILINOGEN UA: 0.2 U/dL

## 2018-06-19 LAB — PROTIME-INR
INR: 2.7 ratio — ABNORMAL HIGH (ref 0.8–1.0)
Prothrombin Time: 31.1 s — ABNORMAL HIGH (ref 9.6–13.1)

## 2018-06-19 LAB — URINALYSIS, MICROSCOPIC ONLY: RBC / HPF: NONE SEEN (ref 0–?)

## 2018-06-19 NOTE — Progress Notes (Signed)
Subjective:  Patient ID: Kevin [name], male    DOB: 03-07-1925  Age: 84 y.o. MRN: [id number]  CC: The primary encounter diagnosis was Confusion. Diagnoses of Long term current use of anticoagulant therapy, Iron deficiency anemia due to chronic blood loss, Cognitive decline, Acute blood loss as cause of postoperative anemia, and Disorientation were also pertinent to this visit.  HPI Kevin [name] presents for evaluation of mental status changes.  His family requested evaluation beause they felt he was having increased word finding problems, causing him to have trouble finishing his sentences on Tuesday. He was also noticed to be walking around  holding a bottle of Vicodin , but he was not sure if he had taken any.  He has not been prescribed vicodin recently. Upon review of the [place] controlled substance database.  Last rx was tramadol in February by Pain management NP  Mee Hives   His mental status has improved in the 48 hours since his family called.  He is awake and alert today and accompanied by his brother  Percell Miller and his niece Milana Kidney.  Both live in [place] .   Outpatient Medications Prior to Visit  Medication Sig Dispense Refill  . acetaminophen (TYLENOL) 325 MG tablet Take 650 mg by mouth every 4 (four) hours as needed for fever. Maximum dose for 24 hours is 3000 mg from all sources of Acetaminophen/tylenol    . Alpha-Lipoic Acid 50 MG CAPS Take 1 capsule by mouth daily.    Marland Kitchen amLODipine (NORVASC) 5 MG tablet Take 5 mg by mouth daily.     . brimonidine (ALPHAGAN) 0.15 % ophthalmic solution Place 1 drop into both eyes 2 (two) times daily.     Marland Kitchen co-enzyme Q-10 30 MG capsule Take 1 capsule (30 mg total) by mouth daily. 90 capsule 3  . docusate sodium (COLACE) 100 MG capsule Take 2 capsules (200 mg total) by mouth at bedtime. 60 capsule 5  . ferrous sulfate 324 (65 FE) MG TBEC Take 1 tablet by mouth every other day.     . furosemide (LASIX) 20 MG tablet Take 1 tablet (20 mg total) by mouth  daily. 30 tablet 2  . latanoprost (XALATAN) 0.005 % ophthalmic solution Place 1 drop into both eyes at bedtime.     Marland Kitchen losartan (COZAAR) 100 MG tablet Take 1 tablet (100 mg total) by mouth daily. 90 tablet 0  . meclizine (ANTIVERT) 25 MG tablet Take 25 mg by mouth 3 (three) times daily as needed for dizziness.    . Melatonin 5 MG TABS Take 1 tablet by mouth at bedtime.     . metoprolol tartrate (LOPRESSOR) 25 MG tablet TAKE 1 TABLET(25 MG) BY MOUTH TWICE DAILY (Patient taking differently: TAKE 1 TABLET(25 MG) BY MOUTH TWICE DAILY. Pt states he is taking 1/2 tablet) 60 tablet 5  . Nutritional Supplements (ENSURE ENLIVE PO) Take 1 Bottle by mouth 2 (two) times daily.    Marland Kitchen omeprazole (PRILOSEC) 40 MG capsule Take 40 mg by mouth daily.    . polyethylene glycol (MIRALAX / GLYCOLAX) packet Take 17 g by mouth daily. Mix one tablespoon with 8oz of your favorite juice or water every day until you are having soft formed stools. Then start taking once daily if you didn't have a stool the day before. 30 each 0  . Red Yeast Rice 600 MG CAPS Take 1 capsule by mouth daily.    Marland Kitchen RUTIN PO Take 500 mg by mouth daily.    . Turmeric  500 MG CAPS Take 1 capsule by mouth daily.    Marland Kitchen warfarin (COUMADIN) 4 MG tablet Take 4 mg by mouth daily.    . Amino Acids-Protein Hydrolys (FEEDING SUPPLEMENT, PRO-STAT SUGAR FREE 64,) LIQD Take 30 mLs by mouth 2 (two) times daily.    Marland Kitchen escitalopram (LEXAPRO) 10 MG tablet Take 10 mg by mouth daily.    . tamsulosin (FLOMAX) 0.4 MG CAPS capsule TAKE 1 CAPSULE(0.4 MG) BY MOUTH DAILY (Patient not taking: Reported on 06/19/2018) 90 capsule 0  . vitamin C (ASCORBIC ACID) 500 MG tablet Take 500 mg by mouth 3 (three) times daily.    Marland Kitchen warfarin (COUMADIN) 1 MG tablet Take 0.5 mg by mouth daily. Give 1/2 tablet (0.5 mg) in addition to 4 mg tablet for a total of 4.5 mg    . Calcium-Magnesium-Vitamin D 600-40-500 MG-MG-UNIT TB24 Take 1 tablet by mouth 2 (two) times daily.    . Lidocaine (ASPERCREME  LIDOCAINE) 4 % PTCH Apply 1 patch topically daily. Apply to base of back/upper right buttock for sciatic pain. Remove patch after 12 hours.    . senna-docusate (SENOKOT-S) 8.6-50 MG tablet Take 1 tablet by mouth at bedtime as needed for mild constipation. (Patient not taking: Reported on 06/19/2018)     No facility-administered medications prior to visit.     Review of Systems;  Patient denies headache, fevers, malaise, unintentional weight loss, skin rash, eye pain, sinus congestion and sinus pain, sore throat, dysphagia,  hemoptysis , cough, dyspnea, wheezing, chest pain, palpitations, orthopnea, edema, abdominal pain, nausea, melena, diarrhea, constipation, flank pain, dysuria, hematuria, urinary  Frequency, nocturia, numbness, tingling, seizures,  Focal weakness, Loss of consciousness,  Tremor, insomnia, depression, anxiety, and suicidal ideation.      Objective:  BP 128/66 (BP Location: Left Arm, Patient Position: Sitting, Cuff Size: Normal)   Pulse 65   Resp 15   Ht 5\' 7"  (1.702 m)   Wt 129 lb 6.4 oz (58.7 kg)   SpO2 97%   BMI 20.27 kg/m   BP Readings from Last 3 Encounters:  06/19/18 128/66  06/05/18 (!) 149/73  06/02/18 135/68    Wt Readings from Last 3 Encounters:  06/19/18 129 lb 6.4 oz (58.7 kg)  06/05/18 142 lb 8 oz (64.6 kg)  06/02/18 140 lb (63.5 kg)    General appearance: alert, cooperative and appears stated age Ears: normal TM's and external ear canals both ears Throat: lips, mucosa, and tongue normal; teeth and gums normal Neck: no adenopathy, no carotid bruit, supple, symmetrical, trachea midline and thyroid not enlarged, symmetric, no tenderness/mass/nodules Back: symmetric, no curvature. ROM normal. No CVA tenderness. Lungs: clear to auscultation bilaterally Heart: regular rate and rhythm, S1, S2 normal, no murmur, click, rub or gallop Abdomen: soft, non-tender; bowel sounds normal; no masses,  no organomegaly Pulses: 2+ and symmetric Skin: Skin color,  texture, turgor normal. No rashes or lesions Lymph nodes: Cervical, supraclavicular, and axillary nodes normal. Neuro:  awake and interactive with normal mood and affect. Higher cortical functions are normal. Speech is clear without word-finding difficulty or dysarthria. Extraocular movements are intact. Visual fields of both eyes are grossly intact. Sensation to light touch is grossly intact bilaterally of upper and lower extremities. Motor examination shows 4+/5 symmetric hand grip and upper extremity and 5/5 lower extremity strength. There is no pronation or drift. Gait is non-ataxic   No results found for: HGBA1C  Lab Results  Component Value Date   CREATININE 0.77 06/05/2018   CREATININE 0.87 05/26/2018  CREATININE 0.82 05/23/2018    Lab Results  Component Value Date   WBC 4.3 06/19/2018   HGB 10.9 (L) 06/19/2018   HCT 32.0 (L) 06/19/2018   PLT 205.0 06/19/2018   GLUCOSE 103 (H) 06/05/2018   CHOL 187 03/06/2018   TRIG 81.0 03/06/2018   HDL 66.40 03/06/2018   LDLDIRECT 105.9 03/13/2012   LDLCALC 104 (H) 03/06/2018   ALT 53 05/26/2018   AST 45 (H) 05/26/2018   NA 135 06/05/2018   K 4.3 06/05/2018   CL 102 06/05/2018   CREATININE 0.77 06/05/2018   BUN 25 (H) 06/05/2018   CO2 25 06/05/2018   TSH 3.14 03/06/2018   PSA 0.87 02/21/2018   INR 2.7 (H) 06/19/2018    No results found.  Assessment & Plan:   Problem List Items Addressed This Visit    Acute blood loss as cause of postoperative anemia    Improving since discharge .  Taking iron every other day  Lab Results  Component Value Date   WBC 4.3 06/19/2018   HGB 10.9 (L) 06/19/2018   HCT 32.0 (L) 06/19/2018   MCV 103.0 (H) 06/19/2018   PLT 205.0 06/19/2018   Lab Results  Component Value Date   IRON 61 05/26/2018   TIBC 304 05/26/2018   FERRITIN 53 05/26/2018         Altered mental status    His acute  Change has resolved and h is oriented  3 and articulate.  Ruling out UTI.  He may have been  experiencing side effects from taking an old   Narcotic given his family's report.  Recommended that , in or to remain independent ,  We have Home health agency set up a weekly pill box for medication. administration       Anemia, iron deficiency   Relevant Orders   CBC with Differential/Platelet (Completed)   Cognitive decline    Recommending [place] agency to set up weekly pill box  To minimize reisk of medication overdose /underdose       Relevant Orders   Ambulatory referral to [place] term current use of anticoagulant therapy   Relevant Orders   Protime-INR (Completed)    Other Visit Diagnoses    Confusion    -  Primary   Relevant Orders   POCT Urinalysis Dipstick (Completed)   Urine Culture (Completed)   Urine Microscopic Only (Completed)   Ambulatory referral to [facility]      I have discontinued [place] Lidocaine, senna-docusate, and Calcium-Magnesium-Vitamin D. I am also having him maintain his brimonidine, latanoprost, ferrous sulfate, co-enzyme Q-10, meclizine, furosemide, Alpha-Lipoic Acid, Melatonin, Red Yeast Rice, RUTIN PO, Turmeric, acetaminophen, polyethylene glycol, metoprolol tartrate, tamsulosin, losartan, docusate sodium, escitalopram, amLODipine, warfarin, warfarin, Nutritional Supplements (ENSURE ENLIVE PO), omeprazole, feeding supplement (PRO-STAT SUGAR FREE 64), and vitamin C.  No orders of the defined types were placed in this encounter.   Medications Discontinued During This Encounter  Medication Reason  . Calcium-Magnesium-Vitamin D 161-09-604 MG-MG-UNIT TB24 Patient has not taken in last 30 days  . Lidocaine (ASPERCREME LIDOCAINE) 4 % PTCH Patient has not taken in last 30 days  . senna-docusate (SENOKOT-S) 8.6-50 MG tablet Patient has not taken in last 30 days    Follow-up: No follow-ups on file.   Crecencio Mc, MD

## 2018-06-19 NOTE — Telephone Encounter (Signed)
Spoke with pt's niece to see if they could get the pt in today at 12pm per verbal from Dr. Derrel Nip. Niece stated they would have pt here at 34.

## 2018-06-19 NOTE — Patient Instructions (Signed)
I have made a referral for Home Health to come out and set up a weekly pill box to help make your medications less confusing    I recommend keeping Ensure and ice cream stocked for thos days you don't feel like eating dinner

## 2018-06-20 ENCOUNTER — Other Ambulatory Visit: Payer: Self-pay | Admitting: Internal Medicine

## 2018-06-20 LAB — URINE CULTURE
MICRO NUMBER: 90822643
SPECIMEN QUALITY:: ADEQUATE

## 2018-06-21 DIAGNOSIS — R4182 Altered mental status, unspecified: Secondary | ICD-10-CM | POA: Insufficient documentation

## 2018-06-21 NOTE — Assessment & Plan Note (Signed)
Improving since discharge .  Taking iron every other day  Lab Results  Component Value Date   WBC 4.3 06/19/2018   HGB 10.9 (L) 06/19/2018   HCT 32.0 (L) 06/19/2018   MCV 103.0 (H) 06/19/2018   PLT 205.0 06/19/2018   Lab Results  Component Value Date   IRON 61 05/26/2018   TIBC 304 05/26/2018   FERRITIN 53 05/26/2018

## 2018-06-21 NOTE — Assessment & Plan Note (Addendum)
Coumadin level is therapeutic,  Continue current regimen of 4 mg x 6,  3 mg x 1 ,  and repeat PT/INR in one week  Lab Results  Component Value Date   INR 2.7 (H) 06/19/2018   INR 3.70 06/07/2018   INR 2.48 06/05/2018   PROTIME 31.2 (A) 04/17/2016   PROTIME 33.7 (A) 08/03/2015   PROTIME 34.4 (A) 07/06/2015

## 2018-06-21 NOTE — Assessment & Plan Note (Signed)
Recommending [facility] agency to set up weekly pill box  To minimize reisk of medication overdose Kevin [name]

## 2018-06-21 NOTE — Assessment & Plan Note (Signed)
His acute  Change has resolved and h is oriented  3 and articulate.  Ruling out UTI.  He may have been experiencing side effects from taking an old   Narcotic given his family's report.  Recommended that , in or to remain independent ,  We have Home health agency set up a weekly pill box for medication. administration

## 2018-06-24 ENCOUNTER — Telehealth: Payer: Self-pay

## 2018-06-24 NOTE — Telephone Encounter (Signed)
Copied from [place] [id number]. Topic: Quick Communication - See Telephone Encounter >> Jun 24, 2018  9:51 AM Antonieta Iba C wrote: CRM for notification. See Telephone encounter for: 06/24/18.  [place] - [phone] w/ [facility]  She called in to be advised. They received pt's referral for home health. While she was looking into things she noticed that pt is already active with [facility]. She would like to confirm before they go forward with seeing pt.

## 2018-06-24 NOTE — Telephone Encounter (Signed)
LMTCB with pt's niece, Laurel(on DPR). Please transfer pt to our office.

## 2018-06-24 NOTE — Telephone Encounter (Signed)
Left message for niece letting her know that we needed to speak with her regarding home health. Please transfer to our office.

## 2018-06-25 NOTE — Telephone Encounter (Signed)
Spoke with [facility] to let them know that according to the pt's niece he does not have active care with [facility] and that they would like to go ahead and get set up with [facility]. Gave them niece's name and phone number so they could get this set up for pt.

## 2018-06-25 NOTE — Telephone Encounter (Signed)
Spoke with niece and she stated that [facility] has not been coming out and working with the pt so she would like to go forward with [facility]. Niece also stated that she would like to be called in regards to getting this set up for the pt.

## 2018-06-27 ENCOUNTER — Telehealth: Payer: Self-pay | Admitting: Internal Medicine

## 2018-06-27 NOTE — Telephone Encounter (Signed)
Verbals given to [place]. Also advised him to check patients INR on Monday 7/22 when he goes out to see patient. Confirmed that current coumadin dose is 4 mg on ALL DAYS except Wednesday. 3 mg on Wednesday.

## 2018-06-27 NOTE — Telephone Encounter (Signed)
Copied from [place] [id number]. Topic: Quick Communication - See Telephone Encounter >> Jun 27, 2018 11:19 AM Ahmed Prima L wrote: CRM for notification. See Telephone encounter for: 06/27/18.  Merry Proud, RN with [facility] would like verbals to see the patient for skilled nursing once a week for medication mgt. Also, does he need to check his PTINR next week?  Call back @ [phone]

## 2018-07-01 ENCOUNTER — Telehealth: Payer: Self-pay | Admitting: *Deleted

## 2018-07-01 NOTE — Telephone Encounter (Signed)
Spoke with Merry Proud, RN at [facility] and informed him that Dr. Derrel Nip would like for him to suspend his coumadin dose for today and then resume 3mg  on Wednesday and 4mg  all other days. Then to repeat his INR in one week. Merry Proud gave a verbal understanding and stated that he would inform pt's niece who has been helping the pt with his medications.

## 2018-07-01 NOTE — Telephone Encounter (Signed)
Merry Proud, RN from [facility] reporting PT/INR results:  PT 46.1  &   INR  3.8  States pt was to take Coumadin 4 mg QD EXCEPT for Wednesday when he was to take 3 mg. RN reports pt took 4 mg on Wednesday as well.    Please advise: [phone] Merry Proud at [place])  Also note:  Merry Proud RN stated pt was instructed to take 3.5 mg Coumadin on Wednesdays, per med profile, order is for 3mg . NT called back to clarify, waiting for return call.   Attempted call to [facility].

## 2018-07-01 NOTE — Telephone Encounter (Signed)
INR is slightly high.  Suspend coumadin for one day (Tuesday).  Resume 3 mg on Wednesday and 4 mg all other days.  Repeat INR in one week

## 2018-07-01 NOTE — Telephone Encounter (Signed)
FYI

## 2018-07-01 NOTE — Telephone Encounter (Signed)
Merry Proud, RN with [facility] stated that pt's INR is 3.8. He stated that pt was to take 4mg  every day but Wednesday and he was to take 3mg  on Wednesday. However Merry Proud stated that the pt took 4mg  on Wednesday as well.

## 2018-07-08 ENCOUNTER — Telehealth: Payer: Self-pay

## 2018-07-08 NOTE — Telephone Encounter (Signed)
fyi

## 2018-07-08 NOTE — Telephone Encounter (Signed)
Agree with assessment.  I will sign FL-2

## 2018-07-08 NOTE — Telephone Encounter (Signed)
Spoke with Merry Proud, RN and informed him that Dr. Derrel Nip would like for the pt to continue taking the current regimen of coumadin and repeat his INR in one week. Merry Proud gave a verbal understanding and stated that he would let the pt's niece know as well. The nurse and the niece talked today and Merry Proud stated that he thinks the pt is now in need of a higher level of care, they discussed reaching out to [facility]. Merry Proud stated that the pt is forgetting to take his meds more often and he did not take any of his meds on Wednesday morning or night. The daughter stated to the RN that she would go over there every evening until they can figure something out with the living situation and make sure he is taking his evening blood pressure and coumadin medications.

## 2018-07-08 NOTE — Telephone Encounter (Signed)
Have him continue current regimen and repeat an INR in one week.

## 2018-07-08 NOTE — Telephone Encounter (Signed)
Merry Proud, RN, with [facility], reports PT 17.7 , INR 1.5 . Reports pt. Held one dose and inadvertently missed Wednesday's 3 mg dose of Coumadin. Reports niece will work on him not missing doses. Contact number for Merry Proud - [phone].

## 2018-07-09 ENCOUNTER — Encounter: Payer: Self-pay | Admitting: Internal Medicine

## 2018-07-09 ENCOUNTER — Ambulatory Visit: Payer: Medicare Other | Admitting: Internal Medicine

## 2018-07-09 VITALS — BP 100/48 | HR 61 | Temp 98.0°F | Resp 15 | Ht 67.0 in | Wt 121.4 lb

## 2018-07-09 DIAGNOSIS — K5904 Chronic idiopathic constipation: Secondary | ICD-10-CM

## 2018-07-09 DIAGNOSIS — R42 Dizziness and giddiness: Secondary | ICD-10-CM

## 2018-07-09 DIAGNOSIS — R1032 Left lower quadrant pain: Secondary | ICD-10-CM

## 2018-07-09 DIAGNOSIS — R4189 Other symptoms and signs involving cognitive functions and awareness: Secondary | ICD-10-CM | POA: Diagnosis not present

## 2018-07-09 MED ORDER — POLYETHYLENE GLYCOL 3350 17 G PO PACK: 17.0000 g | PACK | Freq: Every day | ORAL | 0 refills | Status: DC

## 2018-07-09 NOTE — Patient Instructions (Addendum)
Stop the amlodipine completely;  Your blood pressure is too low   Have Merry Proud check  BP  next week   If your bowels do not start moving daily once you stop the amlodipine,    we will add [place]   Goal is 120/70 or higher;    not below    We will order an abdominal ultrasound  At [facility]  to confirm an inguinal hernia on the  Left side   Please consider moving to Assisted Living.  I think it is time,  Because you are having more trouble with your memory and you are still losing weight.

## 2018-07-09 NOTE — Progress Notes (Signed)
Subjective:  Patient ID: Kevin [name], male    DOB: March 10, 1925  Age: 84 y.o. MRN: [id number]  CC: The primary encounter diagnosis was Inguinal pain, left. Diagnoses of Cognitive decline, Dizziness, and Chronic idiopathic constipation were also pertinent to this visit.  HPI LADAMIEN RAMMEL presents for multiple complaints including increased confusion resulting in medication non adherencenoted by the home health RN, Merry Proud from [facility].  His niece has been checking on him daily to make sure he is takign his medications at night,  But he has already has an elevated INR .   Left sided groin pain.  Patient reports that he has had occasional pain on the right side in the past.  His medical chart noted 2 prior hernia operations,  But neither he nor his brother can remember if this is correct and there is no detail In chart   Dizziness.  Persistent,  Without vertigo.  Metoprolol and amlodipine  doses were reduced by 50% at last cardiology visit in June, due to bradycardia and hypotension   Discussed getting ultrasound next Friday August 9 10:00     Outpatient Medications Prior to Visit  Medication Sig Dispense Refill  . acetaminophen (TYLENOL) 325 MG tablet Take 650 mg by mouth every 4 (four) hours as needed for fever. Maximum dose for 24 hours is 3000 mg from all sources of Acetaminophen/tylenol    . brimonidine (ALPHAGAN) 0.15 % ophthalmic solution Place 1 drop into both eyes 2 (two) times daily.     Marland Kitchen docusate sodium (COLACE) 100 MG capsule Take 2 capsules (200 mg total) by mouth at bedtime. 60 capsule 5  . ferrous sulfate 324 (65 FE) MG TBEC Take 1 tablet by mouth every other day.     . furosemide (LASIX) 20 MG tablet Take 1 tablet (20 mg total) by mouth daily. 30 tablet 2  . latanoprost (XALATAN) 0.005 % ophthalmic solution Place 1 drop into both eyes at bedtime.     Marland Kitchen losartan (COZAAR) 100 MG tablet Take 1 tablet (100 mg total) by mouth daily. 90 tablet 0  . meclizine (ANTIVERT) 25 MG tablet  Take 25 mg by mouth 3 (three) times daily as needed for dizziness.    . Melatonin 5 MG TABS Take 1 tablet by mouth at bedtime.     . metoprolol tartrate (LOPRESSOR) 25 MG tablet TAKE 1 TABLET(25 MG) BY MOUTH TWICE DAILY (Patient taking differently: TAKE 1 TABLET(25 MG) BY MOUTH TWICE DAILY. Pt states he is taking 1/2 tablet) 60 tablet 5  . omeprazole (PRILOSEC) 40 MG capsule Take 40 mg by mouth daily.    . tamsulosin (FLOMAX) 0.4 MG CAPS capsule TAKE 1 CAPSULE(0.4 MG) BY MOUTH DAILY 90 capsule 0  . warfarin (COUMADIN) 1 MG tablet Take 3 mg by mouth daily. Takes 3mg  on Wednesday only    . warfarin (COUMADIN) 2 MG tablet TAKE 2 TABLETS BY MOUTH ON ALL DAYS, BUT TAKE 1 AND 1/2 TABLETS ON WEDNESDAY 180 tablet 1  . warfarin (COUMADIN) 4 MG tablet Take 4 mg by mouth daily.     Marland Kitchen amLODipine (NORVASC) 5 MG tablet Take 5 mg by mouth daily.     . polyethylene glycol (MIRALAX / GLYCOLAX) packet Take 17 g by mouth daily. Mix one tablespoon with 8oz of your favorite juice or water every day until you are having soft formed stools. Then start taking once daily if you didn't have a stool the day before. 30 each 0  . Nutritional Supplements (ENSURE  ENLIVE PO) Take 1 Bottle by mouth 2 (two) times daily.    . Alpha-Lipoic Acid 50 MG CAPS Take 1 capsule by mouth daily.    . Amino Acids-Protein Hydrolys (FEEDING SUPPLEMENT, PRO-STAT SUGAR FREE 64,) LIQD Take 30 mLs by mouth 2 (two) times daily.    Marland Kitchen co-enzyme Q-10 30 MG capsule Take 1 capsule (30 mg total) by mouth daily. (Patient not taking: Reported on 07/09/2018) 90 capsule 3  . escitalopram (LEXAPRO) 10 MG tablet Take 10 mg by mouth daily.    . Red Yeast Rice 600 MG CAPS Take 1 capsule by mouth daily.    Marland Kitchen RUTIN PO Take 500 mg by mouth daily.    . Turmeric 500 MG CAPS Take 1 capsule by mouth daily.    . vitamin C (ASCORBIC ACID) 500 MG tablet Take 500 mg by mouth 3 (three) times daily.     No facility-administered medications prior to visit.     Review of  Systems;  Patient denies headache, fevers, malaise, unintentional weight loss, skin rash, eye pain, sinus congestion and sinus pain, sore throat, dysphagia,  hemoptysis , cough, dyspnea, wheezing, chest pain, palpitations, orthopnea, edema, abdominal pain, nausea, melena, diarrhea, constipation, flank pain, dysuria, hematuria, urinary  Frequency, nocturia, numbness, tingling, seizures,  Focal weakness, Loss of consciousness,  Tremor, insomnia, depression, anxiety, and suicidal ideation.      Objective:  BP (!) 100/48 (BP Location: Left Arm, Patient Position: Sitting, Cuff Size: Normal)   Pulse 61   Temp 98 F (36.7 C) (Oral)   Resp 15   Ht 5\' 7"  (1.702 m)   Wt 121 lb 6.4 oz (55.1 kg)   SpO2 96%   BMI 19.01 kg/m   BP Readings from Last 3 Encounters:  07/09/18 (!) 100/48  06/19/18 128/66  06/05/18 (!) 149/73    Wt Readings from Last 3 Encounters:  07/09/18 121 lb 6.4 oz (55.1 kg)  06/19/18 129 lb 6.4 oz (58.7 kg)  06/05/18 142 lb 8 oz (64.6 kg)    General appearance: alert, cooperative and appears stated age Ears: normal TM's and external ear canals both ears Throat: lips, mucosa, and tongue normal; teeth and gums normal Neck: no adenopathy, no carotid bruit, supple, symmetrical, trachea midline and thyroid not enlarged, symmetric, no tenderness/mass/nodules Back: symmetric, no curvature. ROM normal. No CVA tenderness. Lungs: clear to auscultation bilaterally Heart: regular rate and rhythm, S1, S2 normal, no murmur, click, rub or gallop Abdomen: soft, non-tender; bowel sounds normal; no masses,  no organomegaly Pulses: 2+ and symmetric Skin: Skin color, texture, turgor normal. No rashes or lesions Lymph nodes: Cervical, supraclavicular, and axillary nodes normal.  No results found for: HGBA1C  Lab Results  Component Value Date   CREATININE 0.77 06/05/2018   CREATININE 0.87 05/26/2018   CREATININE 0.82 05/23/2018    Lab Results  Component Value Date   WBC 4.3  06/19/2018   HGB 10.9 (L) 06/19/2018   HCT 32.0 (L) 06/19/2018   PLT 205.0 06/19/2018   GLUCOSE 103 (H) 06/05/2018   CHOL 187 03/06/2018   TRIG 81.0 03/06/2018   HDL 66.40 03/06/2018   LDLDIRECT 105.9 03/13/2012   LDLCALC 104 (H) 03/06/2018   ALT 53 05/26/2018   AST 45 (H) 05/26/2018   NA 135 06/05/2018   K 4.3 06/05/2018   CL 102 06/05/2018   CREATININE 0.77 06/05/2018   BUN 25 (H) 06/05/2018   CO2 25 06/05/2018   TSH 3.14 03/06/2018   PSA 0.87 02/21/2018   INR  2.7 (H) 06/19/2018    No results found.  Assessment & Plan:   Problem List Items Addressed This Visit    Dizziness    Attributed to hypotension  Reducing amlodipine dose to 2.5 mg daily       Constipation    Despite daily use of stool softener and BFL.  will reassess once the amlodipine has been stopped       Cognitive decline    Recommending Transition to assisted living to manage medication and meals.  He has lost 15 lbs since June        Other Visit Diagnoses    Inguinal pain, left    -  Primary   Relevant Orders   US Pelvis Complete     A total of 25 minutes of face to face time was spent with patient more than half of which was spent in counselling about the above mentioned conditions  and coordination of care   I have discontinued Hulbert T. Boerner's co-enzyme Q-10, Alpha-Lipoic Acid, Red Yeast Rice, RUTIN PO, Turmeric, escitalopram, feeding supplement (PRO-STAT SUGAR FREE 64), and vitamin C. I have also changed his polyethylene glycol. Additionally, I am having him maintain his brimonidine, latanoprost, ferrous sulfate, meclizine, furosemide, Melatonin, acetaminophen, metoprolol tartrate, tamsulosin, losartan, docusate sodium, warfarin, warfarin, Nutritional Supplements (ENSURE ENLIVE PO), omeprazole, and warfarin.  Meds ordered this encounter  Medications  . polyethylene glycol (MIRALAX / GLYCOLAX) packet    Sig: Take 17 g by mouth daily.    Dispense:  30 each    Refill:  0    Medications  Discontinued During This Encounter  Medication Reason  . Alpha-Lipoic Acid 50 MG CAPS Patient has not taken in last 30 days  . Amino Acids-Protein Hydrolys (FEEDING SUPPLEMENT, PRO-STAT SUGAR FREE 64,) LIQD Patient has not taken in last 30 days  . co-enzyme Q-10 30 MG capsule Completed Course  . escitalopram (LEXAPRO) 10 MG tablet Patient has not taken in last 30 days  . Red Yeast Rice 600 MG CAPS Patient has not taken in last 30 days  . RUTIN PO Patient has not taken in last 30 days  . Turmeric 500 MG CAPS Patient has not taken in last 30 days  . vitamin C (ASCORBIC ACID) 500 MG tablet Patient has not taken in last 30 days  . polyethylene glycol (MIRALAX / GLYCOLAX) packet     Follow-up: No follow-ups on file.   Crecencio Mc, MD

## 2018-07-10 ENCOUNTER — Other Ambulatory Visit: Payer: Self-pay | Admitting: Internal Medicine

## 2018-07-10 NOTE — Telephone Encounter (Signed)
Looks like medication was discontinued on 06/02/2018 due to a dose change by Dr. Clayborn Bigness. At pt's last office visit it was communicated by pt and pt's niece that the pt is now taking 2.5mg  daily. Is it okay to send rx in for 2.5mg  or does it still need to be sent in as the 5mg ?

## 2018-07-11 ENCOUNTER — Other Ambulatory Visit: Payer: Self-pay | Admitting: Internal Medicine

## 2018-07-11 ENCOUNTER — Telehealth: Payer: Self-pay | Admitting: Internal Medicine

## 2018-07-11 NOTE — Assessment & Plan Note (Signed)
Despite daily use of stool softener and BFL.  will reassess once the amlodipine has been stopped

## 2018-07-11 NOTE — Assessment & Plan Note (Signed)
Attributed to hypotension  Reducing amlodipine dose to 2.5 mg daily

## 2018-07-11 NOTE — Telephone Encounter (Signed)
Called pharmacy and discontinued the amlodipine prescription that was sent in yesterday and family is aware that medication has been discontinued.

## 2018-07-11 NOTE — Telephone Encounter (Signed)
correction  The amlodipine was  stopped at his visit 2 days ago dur to hypotension dizziness and constipation. .  Please correct information .   (SEE AVs)  Please correct info with pharmacy and family

## 2018-07-11 NOTE — Assessment & Plan Note (Signed)
Recommending Transition to assisted living to manage medication and meals.  He has lost 15 lbs since June

## 2018-07-15 ENCOUNTER — Telehealth: Payer: Self-pay | Admitting: Internal Medicine

## 2018-07-15 NOTE — Telephone Encounter (Signed)
LMTCB with Merry Proud, RN at [place] at [facility]. PEC may speak with Merry Proud, RN.

## 2018-07-15 NOTE — Telephone Encounter (Signed)
TOO HIGH.    REDUCE DOSE TO 3 MG ON MON WED AND Friday.  CONTINUE 4 MG ALL OTHE R  DAYS.  REPEAT INR IN 2 WEEKS

## 2018-07-15 NOTE — Telephone Encounter (Signed)
PT/INR results

## 2018-07-15 NOTE — Telephone Encounter (Signed)
Merry Proud: calling with lab report for medication adjustment if needed: PT: 46.1 INR: 3.8  Contact: [phone]

## 2018-07-15 NOTE — Telephone Encounter (Signed)
Pt's INR is 3.8. Pt is taking 3mg  on Wednesday and 4mg  all other days. Merry Proud stated that the pt did not miss any doses this week.

## 2018-07-16 NOTE — Telephone Encounter (Signed)
Spoke with Merry Proud yesterday and informed him of the coumadin dose change and that that the INR needed to be rechecked in 2 weeks. Merry Proud, RN gave a verbal understanding.

## 2018-07-18 ENCOUNTER — Ambulatory Visit
Admission: RE | Admit: 2018-07-18 | Discharge: 2018-07-18 | Disposition: A | Discharge status: Home / Self Care | Payer: Medicare Other | Source: Ambulatory Visit | Attending: Internal Medicine | Admitting: Internal Medicine

## 2018-07-18 DIAGNOSIS — R1032 Left lower quadrant pain: Secondary | ICD-10-CM | POA: Insufficient documentation

## 2018-07-29 ENCOUNTER — Other Ambulatory Visit: Payer: Self-pay | Admitting: Internal Medicine

## 2018-07-29 DIAGNOSIS — N399 Disorder of urinary system, unspecified: Secondary | ICD-10-CM

## 2018-08-08 ENCOUNTER — Other Ambulatory Visit: Payer: Self-pay | Admitting: Internal Medicine

## 2018-08-08 LAB — PROTIME-INR: INR: 2.3 — AB (ref 0.9–1.1)

## 2018-08-08 LAB — POCT INR: INR: 2.3 — AB (ref <=1.1)

## 2018-08-12 ENCOUNTER — Telehealth: Payer: Self-pay

## 2018-08-12 NOTE — Telephone Encounter (Signed)
Spoke with pt's niece and she stated that the pt is currently taking 3mg  Monday, Wednesday and Friday and then 4mg  all the other days.   Niece also wanted to let you know that pt has now decided and is in agreement with you about moving to [place]. She stated that they have reached out to [facility] and he is planning to move around the first week of October.

## 2018-08-12 NOTE — Telephone Encounter (Signed)
INR result is 2.3. According to last message from 07/15/2018, pt is currently taking 3mg  on Wednesday and Friday and taking 4mg  all other days. Attempted to call pt's niece to clarify that this is what the pt is currently taking but did not get an answer, left message to return call.

## 2018-08-12 NOTE — Telephone Encounter (Signed)
Spoke with pt's niece and advised her that Dr. Derrel Nip would like for the pt to continue taking his current coumadin dose on the regimen and check INR again in one month. Niece gave a verbal understanding.

## 2018-08-12 NOTE — Telephone Encounter (Signed)
Coumadin level is therapeutic,  Desired range now 2 to 3,  Continue current regimen and repeat PT/INR in one month

## 2018-08-14 ENCOUNTER — Other Ambulatory Visit: Payer: Self-pay | Admitting: Internal Medicine

## 2018-08-15 ENCOUNTER — Other Ambulatory Visit: Payer: Self-pay | Admitting: Internal Medicine

## 2018-08-19 ENCOUNTER — Telehealth: Payer: Self-pay | Admitting: Internal Medicine

## 2018-08-19 NOTE — Telephone Encounter (Signed)
Fl2 for assisted living request

## 2018-08-19 NOTE — Telephone Encounter (Signed)
Copied from [place] [phone] [phone]. Topic: Quick Communication - See Telephone Encounter >> Aug 19, 2018  3:18 PM Percell Belt A wrote: CRM for notification. See Telephone encounter for: 08/19/18.  Chrissie Noa called in and stated that pt is ready to move into assisted living and needs a FL2 form sent to [facility] , she stated she did not have a fax number.  She felt like it should be on his file. She would like to know once this has been faxed   Call back number [phone]

## 2018-08-25 NOTE — Telephone Encounter (Signed)
Patient's niece, Kevin [name] wanted to check the status of the FL2 being sent to [facility]. She wants to move in by the first week in October, no later than that. She said they can not start working on his room until they get the form.

## 2018-08-25 NOTE — Telephone Encounter (Signed)
completed and returned to you I n red folder

## 2018-08-25 NOTE — Telephone Encounter (Signed)
Waiting to be signed.

## 2018-08-25 NOTE — Telephone Encounter (Signed)
Placed in red folder  

## 2018-08-26 ENCOUNTER — Other Ambulatory Visit: Payer: Self-pay | Admitting: Internal Medicine

## 2018-08-26 DIAGNOSIS — N399 Disorder of urinary system, unspecified: Secondary | ICD-10-CM

## 2018-08-26 NOTE — Telephone Encounter (Signed)
Bradley,Laurel (EC) [phone]    Please notify Berline Chough when/if the [facility] has been sent to  [facility] so they are able to begin preparations for pt to move.

## 2018-08-26 NOTE — Telephone Encounter (Signed)
FL2  

## 2018-08-26 NOTE — Telephone Encounter (Signed)
LMTCB for Mrs Kevin [name] to give [place] a call back. Please transfer her to our office.

## 2018-08-26 NOTE — Telephone Encounter (Signed)
Spoke with Kevin [name] to let her know that the [facility] form has been completed. She stated that she would come by today and pick it up. Form has been placed up front.

## 2018-08-28 NOTE — Telephone Encounter (Signed)
Last OV 07/09/2018   Last refilled 07/30/2018 disp 30 with no refills   Sent to PCP for approval

## 2018-09-08 ENCOUNTER — Telehealth: Payer: Self-pay

## 2018-09-08 NOTE — Telephone Encounter (Signed)
Increase dose to 4 mg on Mondays , continue 3 mg on wed and Fridays

## 2018-09-08 NOTE — Telephone Encounter (Signed)
INR result is 2.4. Pt is taking 3mg  Monday, Wednesday, and Friday and 4mg  all other days.

## 2018-09-09 NOTE — Telephone Encounter (Signed)
Spoke with pt's niece to let her know that per Dr. Lupita Dawn verbal pt should have his INR rechecked in 2 weeks since his regimen was changed. Niece gave a verbal understanding.

## 2018-09-09 NOTE — Telephone Encounter (Signed)
Spoke with pt's niece(on DPR) and informed her of the pt's INR result and medication change. Pt's niece gave a verbal understanding. When should pt have INR result rechecked?

## 2018-09-09 NOTE — Telephone Encounter (Signed)
I believe he is on a weekly INR  Check.  Weekly would be my preference if he has the fingerstick ,  If it has to be by venous stick,  Then  Monthly

## 2018-09-15 ENCOUNTER — Ambulatory Visit: Payer: Medicare Other | Admitting: Internal Medicine

## 2018-09-15 ENCOUNTER — Encounter: Payer: Self-pay | Admitting: Internal Medicine

## 2018-09-15 VITALS — BP 122/56 | HR 65 | Temp 98.3°F | Ht 67.0 in | Wt 136.2 lb

## 2018-09-15 DIAGNOSIS — M79604 Pain in right leg: Secondary | ICD-10-CM

## 2018-09-15 NOTE — Progress Notes (Signed)
Subjective:    Patient ID: Kevin [name], male    DOB: 11/16/1925, 84 y.o.   MRN: [id number]  HPI Here due to pain in right leg Here with brother and niece  Having some pain in right leg and hip---he thinks it may be sciatica Something chronic but particularly painful today Took a hot shower and then hot pain on thigh--and it is much better Some pain into the groin as well  No leg sweling No redness ---but some tenderness No recent injury--but is moving to assisted living at [facility]---doing some packing  Some left side back pain This is not new or different No medications for this other than acetaminophen  Current Outpatient Medications on File Prior to Visit  Medication Sig Dispense Refill  . acetaminophen (TYLENOL) 325 MG tablet Take 650 mg by mouth every 4 (four) hours as needed for fever. Maximum dose for 24 hours is 3000 mg from all sources of Acetaminophen/tylenol    . brimonidine (ALPHAGAN) 0.15 % ophthalmic solution Place 1 drop into both eyes 2 (two) times daily.     Marland Kitchen docusate sodium (COLACE) 100 MG capsule Take 2 capsules (200 mg total) by mouth at bedtime. 60 capsule 5  . ferrous sulfate 324 (65 FE) MG TBEC Take 1 tablet by mouth every other day.     . furosemide (LASIX) 20 MG tablet Take 1 tablet (20 mg total) by mouth daily. 30 tablet 2  . latanoprost (XALATAN) 0.005 % ophthalmic solution Place 1 drop into both eyes at bedtime.     Marland Kitchen losartan (COZAAR) 100 MG tablet Take 1 tablet (100 mg total) by mouth daily. 90 tablet 0  . meclizine (ANTIVERT) 25 MG tablet Take 25 mg by mouth 3 (three) times daily as needed for dizziness.    . Melatonin 5 MG TABS Take 1 tablet by mouth at bedtime.     . metoprolol tartrate (LOPRESSOR) 25 MG tablet TAKE 1 TABLET(25 MG) BY MOUTH TWICE DAILY (Patient taking differently: 12.5 mg. ) 60 tablet 0  . Nutritional Supplements (ENSURE ENLIVE PO) Take 1 Bottle by mouth 2 (two) times daily.    Marland Kitchen omeprazole (PRILOSEC) 40 MG capsule Take 40 mg  by mouth daily.    . polyethylene glycol (MIRALAX / GLYCOLAX) packet Take 17 g by mouth daily. 30 each 0  . tamsulosin (FLOMAX) 0.4 MG CAPS capsule TAKE 1 CAPSULE BY MOUTH EVERY DAY 30 capsule 11  . warfarin (COUMADIN) 3 MG tablet TAKE 1 TABLET BY MOUTH 3 TIMES A WEEK ON MONDAY, WEDNESDAY, AND FRIDAY. (Patient taking differently: Taking 1 tablet by mouth on Wednesday and Friday) 90 tablet 1  . warfarin (COUMADIN) 4 MG tablet Taking 1 tablet by mouth on every day except on Wednesday and Friday    . [DISCONTINUED] amitriptyline (ELAVIL) 25 MG tablet Take 1 tablet (25 mg total) by mouth at bedtime. 30 tablet 2   No current facility-administered medications on file prior to visit.     Allergies  Allergen Reactions  . Cymbalta [Duloxetine Hcl] Other (See Comments)    Urinary retention, constipation and insomnia  . Aspirin     Heart operation was told to never take Aspirin    Past Medical History:  Diagnosis Date  . Atrial fibrillation ([place])   . Cancer ([place])    melanoma- right ear  . Cardiomyopathy, secondary ([place])   . Chronic headaches started age 16  . Congestive heart failure (CHF) ([place])   . Coronary atherosclerosis of native coronary artery   .  DJD (degenerative joint disease)   . Essential hypertension, benign   . GERD (gastroesophageal reflux disease)   . Glaucoma   . History of mitral valve replacement with mechanical valve   . Hyperlipidemia, unspecified   . Hypertension   . Lumbago   . Osteoporosis    secondary to low testoerone  . S/P MVR (mitral valve replacement)   . Sciatica   . Scoliosis     Past Surgical History:  Procedure Laterality Date  . CARDIOVERSION  2013  . CATARACT EXTRACTION, BILATERAL     x 2  . CORONARY ARTERY BYPASS GRAFT  12/2014   coronary artery bypass w/arterial grafts   . HERNIA REPAIR     x 2  . MELANOMA EXCISION     right ear  . MITRAL VALVE REPLACEMENT  1994   [facility]  . NASAL SEPTUM SURGERY  1963  . PERMANENT PACEMAKER INSERTION  Left 12/22/2014    Family History  Problem Relation Age of Onset  . Cancer Father        unknown  . Alzheimer's disease Brother   . Arrhythmia Brother   . Mental illness Mother     Social History   Socioeconomic History  . Marital status: Widowed    Spouse name: Not on file  . Number of children: 0  . Years of education: college degree  . Highest education level: Not on file  Occupational History  . Not on file  Social Needs  . Financial resource strain: Not on file  . Food insecurity:    Worry: Not on file    Inability: Not on file  . Transportation needs:    Medical: Not on file    Non-medical: Not on file  Tobacco Use  . Smoking status: Never Smoker  . Smokeless tobacco: Never Used  Substance and Sexual Activity  . Alcohol use: No  . Drug use: No  . Sexual activity: Not on file  Lifestyle  . Physical activity:    Days per week: Not on file    Minutes per session: Not on file  . Stress: Not on file  Relationships  . Social connections:    Talks on phone: Not on file    Gets together: Not on file    Attends religious service: Not on file    Active member of club or organization: Not on file    Attends meetings of clubs or organizations: Not on file    Relationship status: Not on file  . Intimate partner violence:    Fear of current or ex partner: Not on file    Emotionally abused: Not on file    Physically abused: Not on file    Forced sexual activity: Not on file  Other Topics Concern  . Not on file  Social History Narrative   Exercise; light 3 x week   Lives alone, widowed at [place] No fever Stable issues passing urine No loss of bowel or bladder control Some chronic constipation--but 2 loose stools today    Objective:   Physical Exam  Constitutional: No distress.  Musculoskeletal:  No spine tenderness Fairly severe scoliosis convex to left Left hip lower ROM of hips is fairly normal Some thigh pain with hip  abduction bilaterally (but not clearly positive SLR)  Neurological:  No focal weakness in legs Normal tone           Assessment & Plan:

## 2018-09-15 NOTE — Assessment & Plan Note (Signed)
Seems to be from his back Has been packing boxes--with the box on the floor Probably had strain Heat has helped Discussed more back friendly positioning and getting some help with the move

## 2018-09-22 ENCOUNTER — Telehealth: Payer: Self-pay | Admitting: *Deleted

## 2018-09-22 NOTE — Telephone Encounter (Signed)
Copied from [place] [phone]. Topic: General - Other >> Sep 22, 2018 10:29 AM Yvette Rack wrote: Reason for CRM: Pt niece Chrissie Noa states pt is not going to make the deadline so another FL 2 form would need to be completed by Dr Derrel Nip. Laurel asked that Janett Billow gives her a call once the form is completed and she will stop by to pick it up. Cb# [phone] Ok to leave vm

## 2018-09-23 ENCOUNTER — Other Ambulatory Visit: Payer: Self-pay | Admitting: Internal Medicine

## 2018-09-23 NOTE — Telephone Encounter (Signed)
Copied from [place] [phone]. Topic: General - Other >> Sep 23, 2018 10:19 AM Carolyn Stare wrote:  Chrissie Noa his niece call to say she need to have the FL2 from back to her before end of business day Friday 10/1/819   [phone]

## 2018-09-26 ENCOUNTER — Encounter
Admission: RE | Admit: 2018-09-26 | Discharge: 2018-09-26 | Disposition: A | Discharge status: Home / Self Care | Payer: No Typology Code available for payment source | Source: Ambulatory Visit | Attending: Internal Medicine | Admitting: Internal Medicine

## 2018-09-26 NOTE — Telephone Encounter (Signed)
Returned to you in red folder

## 2018-09-26 NOTE — Telephone Encounter (Signed)
This has been placed in red folder. Pt's niece called and stated that she will need this by the end of today.

## 2018-09-26 NOTE — Telephone Encounter (Signed)
Spoke with niece and let her know that the form has been completed. Niece stated that she is coming to get it this afternoon. Form has been placed up front for pick up.

## 2018-09-27 ENCOUNTER — Other Ambulatory Visit: Payer: Self-pay | Admitting: Internal Medicine

## 2018-10-06 ENCOUNTER — Telehealth: Payer: Self-pay

## 2018-10-06 NOTE — Telephone Encounter (Signed)
Copied from [place] [id number]. Topic: General - Inquiry >> Oct 06, 2018  8:53 AM Judyann Munson wrote: Reason for [id number] M. Leory Plowman is calling to advise the patient is now in Assisted Living a [facility]. She is giving  a direct contact for the patient care giver: Eustace Gagan: [phone]

## 2018-10-07 ENCOUNTER — Other Ambulatory Visit
Admission: RE | Admit: 2018-10-07 | Discharge: 2018-10-07 | Disposition: A | Discharge status: Home / Self Care | Payer: Medicare Other | Source: Ambulatory Visit | Attending: Internal Medicine | Admitting: Internal Medicine

## 2018-10-07 DIAGNOSIS — I4891 Unspecified atrial fibrillation: Secondary | ICD-10-CM | POA: Diagnosis present

## 2018-10-07 LAB — PROTIME-INR
INR: 1.84
PROTHROMBIN TIME: 21 s — AB (ref 11.4–15.2)

## 2018-10-07 NOTE — Telephone Encounter (Signed)
noted 

## 2018-10-07 NOTE — Telephone Encounter (Signed)
Please update the chart with the contact information

## 2018-10-10 ENCOUNTER — Encounter
Admission: RE | Admit: 2018-10-10 | Discharge: 2018-10-10 | Disposition: A | Discharge status: Home / Self Care | Payer: No Typology Code available for payment source | Source: Ambulatory Visit | Attending: Internal Medicine | Admitting: Internal Medicine

## 2018-10-14 ENCOUNTER — Encounter: Payer: Self-pay | Admitting: Adult Health

## 2018-10-14 ENCOUNTER — Non-Acute Institutional Stay (SKILLED_NURSING_FACILITY): Payer: Medicare Other | Admitting: Adult Health

## 2018-10-14 ENCOUNTER — Other Ambulatory Visit: Payer: Self-pay | Admitting: Adult Health

## 2018-10-14 DIAGNOSIS — I4821 Permanent atrial fibrillation: Secondary | ICD-10-CM | POA: Diagnosis not present

## 2018-10-14 DIAGNOSIS — I25719 Atherosclerosis of autologous vein coronary artery bypass graft(s) with unspecified angina pectoris: Secondary | ICD-10-CM | POA: Diagnosis not present

## 2018-10-14 DIAGNOSIS — D500 Iron deficiency anemia secondary to blood loss (chronic): Secondary | ICD-10-CM

## 2018-10-14 DIAGNOSIS — R3911 Hesitancy of micturition: Secondary | ICD-10-CM

## 2018-10-14 DIAGNOSIS — N401 Enlarged prostate with lower urinary tract symptoms: Secondary | ICD-10-CM

## 2018-10-14 DIAGNOSIS — H402230 Chronic angle-closure glaucoma, bilateral, stage unspecified: Secondary | ICD-10-CM

## 2018-10-14 DIAGNOSIS — I110 Hypertensive heart disease with heart failure: Secondary | ICD-10-CM | POA: Diagnosis not present

## 2018-10-14 DIAGNOSIS — K5904 Chronic idiopathic constipation: Secondary | ICD-10-CM

## 2018-10-14 DIAGNOSIS — K219 Gastro-esophageal reflux disease without esophagitis: Secondary | ICD-10-CM

## 2018-10-14 DIAGNOSIS — F330 Major depressive disorder, recurrent, mild: Secondary | ICD-10-CM

## 2018-10-14 DIAGNOSIS — I5022 Chronic systolic (congestive) heart failure: Secondary | ICD-10-CM | POA: Diagnosis not present

## 2018-10-14 NOTE — Progress Notes (Signed)
[facility]   [facility] Room Number: [place] of Service:  SNF (31)   CODE STATUS: DNR  Allergies  Allergen Reactions  . Cymbalta [Duloxetine Hcl] Other (See Comments)    Urinary retention, constipation and insomnia  . Aspirin     Heart operation was told to never take Aspirin    Chief Complaint  Patient presents with  . Acute Visit    Transfer in    HPI:  He is being transferred into skilled nursing for long term placement. He denies any uncontrolled pain; no burning in feet or legs. He denies any changes in appetite; is sleeping well at night. He denies any swelling in his feet. He tells me that this move will require him to make adjustments in his life   Past Medical History:  Diagnosis Date  . Atrial fibrillation ([place])   . Cancer ([place])    melanoma- right ear  . Cardiomyopathy, secondary ([place])   . Chronic headaches started age 40  . Congestive heart failure (CHF) ([place])   . Coronary atherosclerosis of native coronary artery   . DJD (degenerative joint disease)   . Essential hypertension, benign   . GERD (gastroesophageal reflux disease)   . Glaucoma   . History of mitral valve replacement with mechanical valve   . Hyperlipidemia, unspecified   . Hypertension   . Lumbago   . Osteoporosis    secondary to low testoerone  . S/P MVR (mitral valve replacement)   . Sciatica   . Scoliosis     Past Surgical History:  Procedure Laterality Date  . CARDIOVERSION  2013  . CATARACT EXTRACTION, BILATERAL     x 2  . CORONARY ARTERY BYPASS GRAFT  12/2014   coronary artery bypass w/arterial grafts   . HERNIA REPAIR     x 2  . MELANOMA EXCISION     right ear  . MITRAL VALVE REPLACEMENT  1994   [facility]  . NASAL SEPTUM SURGERY  1963  . PERMANENT PACEMAKER INSERTION Left 12/22/2014    Social History   Socioeconomic History  . Marital status: Widowed    Spouse name: Not on file  . Number of children: 0  . Years of education: college  degree  . Highest education level: Not on file  Occupational History  . Not on file  Social Needs  . Financial resource strain: Not on file  . Food insecurity:    Worry: Not on file    Inability: Not on file  . Transportation needs:    Medical: Not on file    Non-medical: Not on file  Tobacco Use  . Smoking status: Never Smoker  . Smokeless tobacco: Never Used  Substance and Sexual Activity  . Alcohol use: No  . Drug use: No  . Sexual activity: Not on file  Lifestyle  . Physical activity:    Days per week: Not on file    Minutes per session: Not on file  . Stress: Not on file  Relationships  . Social connections:    Talks on phone: Not on file    Gets together: Not on file    Attends religious service: Not on file    Active member of club or organization: Not on file    Attends meetings of clubs or organizations: Not on file    Relationship status: Not on file  . Intimate partner violence:    Fear of current or ex partner: Not on file  Emotionally abused: Not on file    Physically abused: Not on file    Forced sexual activity: Not on file  Other Topics Concern  . Not on file  Social History Narrative   Exercise; light 3 x week   Lives alone, widowed at [place] History  Problem Relation Age of Onset  . Cancer Father        unknown  . Alzheimer's disease Brother   . Arrhythmia Brother   . Mental illness Mother       VITAL SIGNS BP (!) 124/58   Pulse 60   Resp 14   Ht 5\' 7"  (1.702 m)   Wt 132 lb 6.4 oz (60.1 kg)   BMI 20.74 kg/m   Outpatient Encounter Medications as of 10/14/2018  Medication Sig  . acetaminophen (TYLENOL) 325 MG tablet Take 650 mg by mouth every 4 (four) hours as needed for fever. Maximum dose for 24 hours is 3000 mg from all sources of Acetaminophen/tylenol  . brimonidine (ALPHAGAN) 0.15 % ophthalmic solution Place 1 drop into both eyes 2 (two) times daily.   Marland Kitchen docusate sodium (COLACE) 100 MG capsule Take 2 capsules  (200 mg total) by mouth at bedtime.  . ferrous sulfate 324 (65 FE) MG TBEC Take 1 tablet by mouth every other day.   . furosemide (LASIX) 20 MG tablet Take 1 tablet (20 mg total) by mouth daily.  Marland Kitchen latanoprost (XALATAN) 0.005 % ophthalmic solution Place 1 drop into both eyes at bedtime.   Marland Kitchen losartan (COZAAR) 100 MG tablet TAKE 1 TABLET BY MOUTH DAILY  . meclizine (ANTIVERT) 25 MG tablet Take 25 mg by mouth 3 (three) times daily as needed for dizziness.  . Melatonin 3 MG TABS Take 1 tablet by mouth at bedtime.   . metoprolol tartrate (LOPRESSOR) 25 MG tablet Take 25 mg by mouth 2 (two) times daily. Hold for BP less than 100/'s or pulse less than 60  . NON FORMULARY Diet Type: regular  . Nutritional Supplements (ENSURE ENLIVE PO) Take 1 Bottle by mouth 2 (two) times daily.  Marland Kitchen omeprazole (PRILOSEC) 40 MG capsule Take 40 mg by mouth daily.  . ondansetron (ZOFRAN-ODT) 4 MG disintegrating tablet Take 4 mg by mouth every 4 (four) hours as needed for nausea or vomiting.  . polyethylene glycol (MIRALAX / GLYCOLAX) packet Take 17 g by mouth daily.  . tamsulosin (FLOMAX) 0.4 MG CAPS capsule TAKE 1 CAPSULE BY MOUTH EVERY DAY  . warfarin (COUMADIN) 3 MG tablet Give 1 tablet by mouth every Wednesday and Friday  . warfarin (COUMADIN) 4 MG tablet Give 1 tablet by mouth daily on Monday, Tuesday, Thursday and Saturday   No facility-administered encounter medications on file as of 10/14/2018.      SIGNIFICANT DIAGNOSTIC EXAMS   LABS REVIEWED:   06-19-18: wbc 4.3; hgb 10.9; hct 32.0; mcv 103.0; plt 205   Review of Systems  Constitutional: Negative for malaise/fatigue.  Respiratory: Negative for cough and shortness of breath.   Cardiovascular: Negative for chest pain, palpitations and leg swelling.  Gastrointestinal: Negative for abdominal pain, constipation and heartburn.  Musculoskeletal: Negative for back pain, joint pain and myalgias.  Skin: Negative.   Neurological: Negative for dizziness.    Psychiatric/Behavioral: The patient is not nervous/anxious.     Physical Exam  Constitutional: He is oriented to person, place, and time. He appears well-developed and well-nourished. No distress.  Thin   Neck: No thyromegaly present.  Cardiovascular:  Normal rate, regular rhythm and intact distal pulses.  Murmur heard. Pacemaker Status post mitral valve replacement  2/6 S3 click   Pulmonary/Chest: Effort normal and breath sounds normal. No respiratory distress.  Abdominal: Soft. Bowel sounds are normal. He exhibits no distension. There is no tenderness.  Musculoskeletal: He exhibits no edema.  Is able to move all extremities scoliosis   Lymphadenopathy:    He has no cervical adenopathy.  Neurological: He is alert and oriented to person, place, and time.  Skin: Skin is warm and dry. He is not diaphoretic.  Psychiatric: He has a normal mood and affect.      ASSESSMENT/ PLAN:  TODAY:   1. Permanent atrial fibrillation status post pacemaker and mitral valve replacement: heart rate is stable: will continue lopressor 25 mg twice daily for rate control is on long term coumadin therapy  2. Chronic systolic heart failure: EF 35-40% (04-30-13): is stable will continue lasix 20 mg daily   3. Hypertensive heart disease with chronic systolic heart failure: is stable b/p 124/58: will continue cozaar 100 mg daily lopressor 25 mg twice daily   4. Coronary artery disease involving autologous vein coronary bypass graft with angina pectoris: is stable no complaints of chest pain; is status post cabg and mitral valve replacement   5. GERD without esophagitis: is stable will continue prilosec 40 mg daily  6. Chronic idiopathic constipation: is stable will continue colace 200 mg nightly and miralax daily   7. Benign prostatic hyperplasia with urinary hesitancy: is stable will continue flomax 0.4 mg daily  8. Iron deficiency anemia due to chronic blood loss: stable hgb 10.3: will continue iron  every other day   9. Chronic angle-closure glaucoma, bilateral: is stable will continue alphagan both eyes twice daily and xalatan both eyes nightly   10. Major depressive disorder, recurrent episode, mild: is stable is currently not on medications; will continue melatonin 3 mg nightly   Will check cbc; cmp INR in the AM     MD is aware of resident's narcotic use and is in agreement with current plan of care. We will attempt to wean resident as apropriate   Ok Edwards NP [facility]  Contact [phone] Monday through Friday 8am- 5pm  After hours call [phone]

## 2018-10-17 ENCOUNTER — Other Ambulatory Visit
Admission: RE | Admit: 2018-10-17 | Discharge: 2018-10-17 | Disposition: A | Discharge status: Home / Self Care | Payer: Medicare Other | Source: Ambulatory Visit | Attending: Adult Health | Admitting: Adult Health

## 2018-10-17 DIAGNOSIS — I4891 Unspecified atrial fibrillation: Secondary | ICD-10-CM | POA: Diagnosis present

## 2018-10-17 LAB — CBC WITH DIFFERENTIAL/PLATELET
Abs Immature Granulocytes: 0.02 10*3/uL (ref 0.00–0.07)
Basophils Absolute: 0 10*3/uL (ref 0.0–0.1)
Basophils Relative: 1 %
EOS ABS: 0.3 10*3/uL (ref 0.0–0.5)
EOS PCT: 5 %
HCT: 31.3 % — ABNORMAL LOW (ref 39.0–52.0)
Hemoglobin: 10.4 g/dL — ABNORMAL LOW (ref 13.0–17.0)
IMMATURE GRANULOCYTES: 0 %
Lymphocytes Relative: 14 %
Lymphs Abs: 0.7 10*3/uL (ref 0.7–4.0)
MCH: 34.3 pg — AB (ref 26.0–34.0)
MCHC: 33.2 g/dL (ref 30.0–36.0)
MCV: 103.3 fL — AB (ref 80.0–100.0)
MONO ABS: 0.7 10*3/uL (ref 0.1–1.0)
MONOS PCT: 13 %
NEUTROS PCT: 67 %
Neutro Abs: 3.5 10*3/uL (ref 1.7–7.7)
Platelets: 196 10*3/uL (ref 150–400)
RBC: 3.03 MIL/uL — ABNORMAL LOW (ref 4.22–5.81)
RDW: 14.9 % (ref 11.5–15.5)
WBC: 5.2 10*3/uL (ref 4.0–10.5)
nRBC: 0 % (ref 0.0–0.2)

## 2018-10-17 LAB — COMPREHENSIVE METABOLIC PANEL
ALT: 28 U/L (ref 0–44)
AST: 37 U/L (ref 15–41)
Albumin: 4 g/dL (ref 3.5–5.0)
Alkaline Phosphatase: 52 U/L (ref 38–126)
Anion gap: 6 (ref 5–15)
BILIRUBIN TOTAL: 0.9 mg/dL (ref 0.3–1.2)
BUN: 19 mg/dL (ref 8–23)
CHLORIDE: 99 mmol/L (ref 98–111)
CO2: 27 mmol/L (ref 22–32)
Calcium: 9.1 mg/dL (ref 8.9–10.3)
Creatinine, Ser: 0.94 mg/dL (ref 0.61–1.24)
Glucose, Bld: 126 mg/dL — ABNORMAL HIGH (ref 70–99)
POTASSIUM: 4 mmol/L (ref 3.5–5.1)
Sodium: 132 mmol/L — ABNORMAL LOW (ref 135–145)
TOTAL PROTEIN: 6.2 g/dL — AB (ref 6.5–8.1)

## 2018-10-17 LAB — PROTIME-INR
INR: 1.46
PROTHROMBIN TIME: 17.6 s — AB (ref 11.4–15.2)

## 2018-10-18 ENCOUNTER — Encounter: Payer: Self-pay | Admitting: Adult Health

## 2018-10-18 DIAGNOSIS — I5022 Chronic systolic (congestive) heart failure: Secondary | ICD-10-CM | POA: Insufficient documentation

## 2018-10-18 DIAGNOSIS — I5020 Unspecified systolic (congestive) heart failure: Secondary | ICD-10-CM

## 2018-10-18 DIAGNOSIS — H402230 Chronic angle-closure glaucoma, bilateral, stage unspecified: Secondary | ICD-10-CM | POA: Insufficient documentation

## 2018-10-18 DIAGNOSIS — I110 Hypertensive heart disease with heart failure: Secondary | ICD-10-CM | POA: Insufficient documentation

## 2018-10-18 DIAGNOSIS — I25719 Atherosclerosis of autologous vein coronary artery bypass graft(s) with unspecified angina pectoris: Secondary | ICD-10-CM

## 2018-10-18 DIAGNOSIS — K5904 Chronic idiopathic constipation: Secondary | ICD-10-CM | POA: Insufficient documentation

## 2018-10-18 HISTORY — DX: Atherosclerosis of autologous vein coronary artery bypass graft(s) with unspecified angina pectoris: I25.719

## 2018-10-23 ENCOUNTER — Non-Acute Institutional Stay: Payer: Medicare Other | Admitting: Adult Health

## 2018-10-23 ENCOUNTER — Other Ambulatory Visit
Admission: RE | Admit: 2018-10-23 | Discharge: 2018-10-23 | Disposition: A | Discharge status: Home / Self Care | Payer: Medicare Other | Source: Skilled Nursing Facility | Attending: Adult Health | Admitting: Adult Health

## 2018-10-23 ENCOUNTER — Encounter: Payer: Self-pay | Admitting: Adult Health

## 2018-10-23 DIAGNOSIS — I110 Hypertensive heart disease with heart failure: Secondary | ICD-10-CM | POA: Diagnosis not present

## 2018-10-23 DIAGNOSIS — I4821 Permanent atrial fibrillation: Secondary | ICD-10-CM | POA: Diagnosis not present

## 2018-10-23 DIAGNOSIS — Z7901 Long term (current) use of anticoagulants: Secondary | ICD-10-CM | POA: Insufficient documentation

## 2018-10-23 DIAGNOSIS — I5022 Chronic systolic (congestive) heart failure: Secondary | ICD-10-CM | POA: Diagnosis not present

## 2018-10-23 LAB — PROTIME-INR
INR: 1.39
PROTHROMBIN TIME: 16.9 s — AB (ref 11.4–15.2)

## 2018-10-23 NOTE — Progress Notes (Signed)
[facility]:   [facility] Room Number: [place] of Service:  SNF (31)   CODE STATUS: DNR  Allergies  Allergen Reactions  . Cymbalta [Duloxetine Hcl] Other (See Comments)    Urinary retention, constipation and insomnia  . Aspirin     Heart operation was told to never take Aspirin    Chief Complaint  Patient presents with  . Medical Management of Chronic Issues    Hypertensive heart disease with chronic systolic congestive heart failure; permanent atrial fibrillation; chronic systolic heart failure; long term current use of anticoagulants with INR goal of 2.5-3.5   Weekly for the first 30 days post admission.     HPI:  He is a 84 year old long term resident of assisted living being seen for the management of his chronic illnesses: hypertensive heart disease; afib; systolic heart failure and anticoagulant use. He denies any uncontrolled pain. He denies any blood in stool and no blood in urine. There are no changes in appetite; no depressive thoughts.   Past Medical History:  Diagnosis Date  . Atrial fibrillation ([place])   . Cancer ([place])    melanoma- right ear  . Cardiomyopathy, secondary ([place])   . Chronic headaches started age 7  . Congestive heart failure (CHF) ([place])   . Coronary artery disease involving autologous vein coronary bypass graft with angina pectoris ([place]) 10/18/2018  . Coronary atherosclerosis of native coronary artery   . DJD (degenerative joint disease)   . Essential hypertension, benign   . GERD (gastroesophageal reflux disease)   . Glaucoma   . History of mitral valve replacement with mechanical valve   . Hyperlipidemia, unspecified   . Hypertension   . Lumbago   . Osteoporosis    secondary to low testoerone  . S/P MVR (mitral valve replacement)   . Sciatica   . Scoliosis     Past Surgical History:  Procedure Laterality Date  . CARDIOVERSION  2013  . CATARACT EXTRACTION, BILATERAL     x 2  . CORONARY ARTERY BYPASS GRAFT   12/2014   coronary artery bypass w/arterial grafts   . HERNIA REPAIR     x 2  . MELANOMA EXCISION     right ear  . MITRAL VALVE REPLACEMENT  1994   [facility]  . NASAL SEPTUM SURGERY  1963  . PERMANENT PACEMAKER INSERTION Left 12/22/2014    Social History   Socioeconomic History  . Marital status: Widowed    Spouse name: Not on file  . Number of children: 0  . Years of education: college degree  . Highest education level: Not on file  Occupational History  . Not on file  Social Needs  . Financial resource strain: Not on file  . Food insecurity:    Worry: Not on file    Inability: Not on file  . Transportation needs:    Medical: Not on file    Non-medical: Not on file  Tobacco Use  . Smoking status: Never Smoker  . Smokeless tobacco: Never Used  Substance and Sexual Activity  . Alcohol use: No  . Drug use: No  . Sexual activity: Not on file  Lifestyle  . Physical activity:    Days per week: Not on file    Minutes per session: Not on file  . Stress: Not on file  Relationships  . Social connections:    Talks on phone: Not on file    Gets together: Not on file  Attends religious service: Not on file    Active member of club or organization: Not on file    Attends meetings of clubs or organizations: Not on file    Relationship status: Not on file  . Intimate partner violence:    Fear of current or ex partner: Not on file    Emotionally abused: Not on file    Physically abused: Not on file    Forced sexual activity: Not on file  Other Topics Concern  . Not on file  Social History Narrative   Exercise; light 3 x week   Lives alone, widowed at [place] History  Problem Relation Age of Onset  . Cancer Father        unknown  . Alzheimer's disease Brother   . Arrhythmia Brother   . Mental illness Mother       VITAL SIGNS BP 136/70   Pulse 71   Ht 5\' 7"  (1.702 m)   Wt 132 lb 6.4 oz (60.1 kg)   BMI 20.74 kg/m   Outpatient Encounter  Medications as of 10/23/2018  Medication Sig  . acetaminophen (TYLENOL) 325 MG tablet Take 650 mg by mouth every 4 (four) hours as needed for fever. Maximum dose for 24 hours is 3000 mg from all sources of Acetaminophen/tylenol  . brimonidine (ALPHAGAN) 0.15 % ophthalmic solution Place 1 drop into both eyes 2 (two) times daily.   . Cholecalciferol (VITAMIN D) 50 MCG (2000 UT) tablet Take 2,000 Units by mouth daily.  . Coenzyme Q10 10 MG capsule Take 100 mg by mouth 4 (four) times daily.  Marland Kitchen docusate sodium (COLACE) 100 MG capsule Take 2 capsules (200 mg total) by mouth at bedtime.  . ferrous sulfate 324 (65 FE) MG TBEC Take 1 tablet by mouth every other day.   . furosemide (LASIX) 20 MG tablet Take 1 tablet (20 mg total) by mouth daily.  Marland Kitchen latanoprost (XALATAN) 0.005 % ophthalmic solution Place 1 drop into both eyes at bedtime.   Marland Kitchen losartan (COZAAR) 100 MG tablet TAKE 1 TABLET BY MOUTH DAILY  . meclizine (ANTIVERT) 25 MG tablet Take 25 mg by mouth 3 (three) times daily as needed for dizziness.  . Melatonin 3 MG TABS Take 1 tablet by mouth at bedtime.   . metoprolol tartrate (LOPRESSOR) 25 MG tablet Take 25 mg by mouth 2 (two) times daily. Hold for BP less than 100/'s or pulse less than 60  . niacinamide 500 MG tablet Take 500 mg by mouth daily.  . NON FORMULARY Diet Type: regular  . Nutritional Supplements (ENSURE ENLIVE PO) Take 1 Bottle by mouth 2 (two) times daily.  Marland Kitchen omeprazole (PRILOSEC) 40 MG capsule Take 40 mg by mouth 2 (two) times daily.   . ondansetron (ZOFRAN-ODT) 4 MG disintegrating tablet Take 4 mg by mouth every 4 (four) hours as needed for nausea or vomiting.  . polyethylene glycol (MIRALAX / GLYCOLAX) packet Take 17 g by mouth daily.  Marland Kitchen RUTIN PO Take 500 mg by mouth daily.  . tamsulosin (FLOMAX) 0.4 MG CAPS capsule TAKE 1 CAPSULE BY MOUTH EVERY DAY  . warfarin (COUMADIN) 3 MG tablet Give 1 tablet by mouth every Wednesday and Friday  . warfarin (COUMADIN) 4 MG tablet Give 1  tablet by mouth daily on Monday, Tuesday, Thursday and Saturday  . [DISCONTINUED] amitriptyline (ELAVIL) 25 MG tablet Take 1 tablet (25 mg total) by mouth at bedtime.   No facility-administered encounter medications  on file as of 10/23/2018.      SIGNIFICANT DIAGNOSTIC EXAMS  LABS REVIEWED: PREVIOUS   06-19-18: wbc 4.3; hgb 10.9; hct 32.0; mcv 103.0; plt 205   TODAY:  10-17-18: wbc 5.2; hgb 10.4; hct 31.3; mcv 103.3 plt 196; glucose 126; bun 19; creat 0.94; k+ 4.0; na++ 132; ca 9.1; liver normal albumin 4.0 INR 1.46 10-23-18 INR: 1.39    Review of Systems  Constitutional: Negative for malaise/fatigue.  Respiratory: Negative for cough and shortness of breath.   Cardiovascular: Negative for chest pain, palpitations and leg swelling.  Gastrointestinal: Negative for abdominal pain, constipation and heartburn.  Musculoskeletal: Negative for back pain, joint pain and myalgias.  Skin: Negative.   Neurological: Negative for dizziness.  Psychiatric/Behavioral: The patient is not nervous/anxious.     Physical Exam  Constitutional: He is oriented to person, place, and time. He appears well-developed and well-nourished. No distress.  Thin   Neck: No thyromegaly present.  Cardiovascular: Normal rate, regular rhythm and intact distal pulses.  Murmur heard. Pacemaker Status post mitral valve replacement  2/6 S3 click    Pulmonary/Chest: Effort normal and breath sounds normal. No respiratory distress.  Abdominal: Soft. Bowel sounds are normal. He exhibits no distension. There is no tenderness.  Musculoskeletal: He exhibits no edema.  Is able to move all extremities scoliosis    Lymphadenopathy:    He has no cervical adenopathy.  Neurological: He is alert and oriented to person, place, and time.  Skin: Skin is warm and dry. He is not diaphoretic.  Psychiatric: He has a normal mood and affect.     ASSESSMENT/ PLAN:  TODAY:   1. Permanent atrial fibrillation status post pacemaker  and mitral valve replacement: heart rate is stable: will continue lopressor 25 mg twice daily for rate control is on long term coumadin therapy   2. Long term current use of anticoagulants with INR goal of 2.-3.5: for INR 1.39 will begin coumadin 5 mg nightly and will check INR on 10-30-18.   3. Chronic systolic heart failure: EF 35-40% (04-30-13): is stable will continue lasix 20 mg daily   4. Hypertensive heart disease with chronic systolic heart failure: is stable b/p 136/70: will continue cozaar 100 mg daily lopressor 25 mg twice daily   PREVIOUS  5. Coronary artery disease involving autologous vein coronary bypass graft with angina pectoris: is stable no complaints of chest pain; is status post cabg and mitral valve replacement    6. GERD without esophagitis: is stable will continue prilosec 40 mg twice daily  7. Chronic idiopathic constipation: is stable will continue colace 200 mg nightly and miralax daily   8. Benign prostatic hyperplasia with urinary hesitancy: is stable will continue flomax 0.4 mg daily  9. Iron deficiency anemia due to chronic blood loss: stable hgb 10.4: will continue iron every other day   10. Chronic angle-closure glaucoma, bilateral: is stable will continue alphagan both eyes twice daily and xalatan both eyes nightly   11. Major depressive disorder, recurrent episode, mild: is stable is currently not on medications; will continue melatonin 3 mg nightly       MD is aware of resident's narcotic use and is in agreement with current plan of care. We will attempt to wean resident as apropriate   Ok Edwards NP [facility]  Contact [phone] Monday through Friday 8am- 5pm  After hours call [phone]

## 2018-10-28 ENCOUNTER — Encounter: Payer: Self-pay | Admitting: Adult Health

## 2018-10-28 ENCOUNTER — Non-Acute Institutional Stay: Payer: Medicare Other | Admitting: Adult Health

## 2018-10-28 DIAGNOSIS — D500 Iron deficiency anemia secondary to blood loss (chronic): Secondary | ICD-10-CM

## 2018-10-28 DIAGNOSIS — F330 Major depressive disorder, recurrent, mild: Secondary | ICD-10-CM

## 2018-10-28 DIAGNOSIS — R3911 Hesitancy of micturition: Secondary | ICD-10-CM

## 2018-10-28 DIAGNOSIS — N401 Enlarged prostate with lower urinary tract symptoms: Secondary | ICD-10-CM

## 2018-10-28 DIAGNOSIS — H402230 Chronic angle-closure glaucoma, bilateral, stage unspecified: Secondary | ICD-10-CM | POA: Diagnosis not present

## 2018-10-28 NOTE — Progress Notes (Signed)
[facility]:   [facility] Room Number: [place] of Service:  ALF (13)   CODE STATUS: DNR  Allergies  Allergen Reactions  . Cymbalta [Duloxetine Hcl] Other (See Comments)    Urinary retention, constipation and insomnia  . Aspirin     Heart operation was told to never take Aspirin    Chief Complaint  Patient presents with  . Medical Management of Chronic Issues    Benign prostatic hyperplasia with urinary hesitancy; iron deficiency anemia due to chronic blood loss; chronic angle-closure glaucoma of both eyes unspecified glaucoma stage; mild episode of recurrent major depressive disorder. Weekly follow up for the first days post admission.     HPI:  He is a 84 year old long term resident of assisted living being seen for the management of his chronic illnesses: bph; anemia; glaucoma; depression. He denies any uncontrolled pain; no changes in appetite; no anxiety or depressive thoughts.   Past Medical History:  Diagnosis Date  . Atrial fibrillation ([place])   . Cancer ([place])    melanoma- right ear  . Cardiomyopathy, secondary ([place])   . Chronic headaches started age 44  . Congestive heart failure (CHF) ([place])   . Coronary artery disease involving autologous vein coronary bypass graft with angina pectoris ([place]) 10/18/2018  . Coronary atherosclerosis of native coronary artery   . DJD (degenerative joint disease)   . Essential hypertension, benign   . GERD (gastroesophageal reflux disease)   . Glaucoma   . History of mitral valve replacement with mechanical valve   . Hyperlipidemia, unspecified   . Hypertension   . Lumbago   . Osteoporosis    secondary to low testoerone  . S/P MVR (mitral valve replacement)   . Sciatica   . Scoliosis     Past Surgical History:  Procedure Laterality Date  . CARDIOVERSION  2013  . CATARACT EXTRACTION, BILATERAL     x 2  . CORONARY ARTERY BYPASS GRAFT  12/2014   coronary artery bypass w/arterial grafts   . HERNIA  REPAIR     x 2  . MELANOMA EXCISION     right ear  . MITRAL VALVE REPLACEMENT  1994   [facility]  . NASAL SEPTUM SURGERY  1963  . PERMANENT PACEMAKER INSERTION Left 12/22/2014    Social History   Socioeconomic History  . Marital status: Widowed    Spouse name: Not on file  . Number of children: 0  . Years of education: college degree  . Highest education level: Not on file  Occupational History  . Not on file  Social Needs  . Financial resource strain: Not on file  . Food insecurity:    Worry: Not on file    Inability: Not on file  . Transportation needs:    Medical: Not on file    Non-medical: Not on file  Tobacco Use  . Smoking status: Never Smoker  . Smokeless tobacco: Never Used  Substance and Sexual Activity  . Alcohol use: No  . Drug use: No  . Sexual activity: Not on file  Lifestyle  . Physical activity:    Days per week: Not on file    Minutes per session: Not on file  . Stress: Not on file  Relationships  . Social connections:    Talks on phone: Not on file    Gets together: Not on file    Attends religious service: Not on file    Active member of club or  organization: Not on file    Attends meetings of clubs or organizations: Not on file    Relationship status: Not on file  . Intimate partner violence:    Fear of current or ex partner: Not on file    Emotionally abused: Not on file    Physically abused: Not on file    Forced sexual activity: Not on file  Other Topics Concern  . Not on file  Social History Narrative   Exercise; light 3 x week   Lives alone, widowed at [place] History  Problem Relation Age of Onset  . Cancer Father        unknown  . Alzheimer's disease Brother   . Arrhythmia Brother   . Mental illness Mother       VITAL SIGNS BP (!) 138/52   Pulse (!) 56   Ht 5\' 7"  (1.702 m)   Wt 132 lb 6.4 oz (60.1 kg)   BMI 20.74 kg/m   Outpatient Encounter Medications as of 10/28/2018  Medication Sig  .  acetaminophen (TYLENOL) 325 MG tablet Take 650 mg by mouth every 4 (four) hours as needed for fever. Maximum dose for 24 hours is 3000 mg from all sources of Acetaminophen/tylenol  . brimonidine (ALPHAGAN) 0.15 % ophthalmic solution Place 1 drop into both eyes 2 (two) times daily.   . Cholecalciferol (VITAMIN D) 50 MCG (2000 UT) tablet Take 2,000 Units by mouth daily.  . Coenzyme Q10 10 MG capsule Take 100 mg by mouth 4 (four) times daily.  Marland Kitchen docusate sodium (COLACE) 100 MG capsule Take 2 capsules (200 mg total) by mouth at bedtime.  . ferrous sulfate 324 (65 FE) MG TBEC Take 1 tablet by mouth every other day.   . furosemide (LASIX) 20 MG tablet Take 1 tablet (20 mg total) by mouth daily.  Marland Kitchen latanoprost (XALATAN) 0.005 % ophthalmic solution Place 1 drop into both eyes at bedtime.   Marland Kitchen losartan (COZAAR) 100 MG tablet TAKE 1 TABLET BY MOUTH DAILY  . meclizine (ANTIVERT) 25 MG tablet Take 25 mg by mouth 3 (three) times daily as needed for dizziness.  . Melatonin 3 MG TABS Take 1 tablet by mouth at bedtime.   . metoprolol tartrate (LOPRESSOR) 25 MG tablet Take 25 mg by mouth 2 (two) times daily. Hold for BP less than 100/'s or pulse less than 60  . niacinamide 500 MG tablet Take 500 mg by mouth daily.  . NON FORMULARY Diet Type: regular  . Nutritional Supplements (ENSURE ENLIVE PO) Take 1 Bottle by mouth 2 (two) times daily.  Marland Kitchen omeprazole (PRILOSEC) 40 MG capsule Take 40 mg by mouth 2 (two) times daily.   . ondansetron (ZOFRAN-ODT) 4 MG disintegrating tablet Take 4 mg by mouth every 4 (four) hours as needed for nausea or vomiting.  . polyethylene glycol (MIRALAX / GLYCOLAX) packet Take 17 g by mouth daily.  Marland Kitchen RUTIN PO Take 500 mg by mouth daily.  . tamsulosin (FLOMAX) 0.4 MG CAPS capsule TAKE 1 CAPSULE BY MOUTH EVERY DAY  . warfarin (COUMADIN) 5 MG tablet 5 mg at bedtime.   . [DISCONTINUED] amitriptyline (ELAVIL) 25 MG tablet Take 1 tablet (25 mg total) by mouth at bedtime.  . [DISCONTINUED]  warfarin (COUMADIN) 3 MG tablet Give 1 tablet by mouth every Wednesday and Friday   No facility-administered encounter medications on file as of 10/28/2018.      SIGNIFICANT DIAGNOSTIC EXAMS  LABS REVIEWED: PREVIOUS  06-19-18: wbc 4.3; hgb 10.9; hct 32.0; mcv 103.0; plt 205  10-17-18: wbc 5.2; hgb 10.4; hct 31.3; mcv 103.3 plt 196; glucose 126; bun 19; creat 0.94; k+ 4.0; na++ 132; ca 9.1; liver normal albumin 4.0 INR 1.46 10-23-18 INR: 1.39   NO NEW LABS.    Review of Systems  Constitutional: Negative for malaise/fatigue.  Respiratory: Negative for cough and shortness of breath.   Cardiovascular: Negative for chest pain, palpitations and leg swelling.  Gastrointestinal: Negative for abdominal pain, constipation and heartburn.  Musculoskeletal: Negative for back pain, joint pain and myalgias.  Skin: Negative.   Neurological: Negative for dizziness.  Psychiatric/Behavioral: The patient is not nervous/anxious.     Physical Exam  Constitutional: He is oriented to person, place, and time. He appears well-developed and well-nourished. No distress.  Thin   Neck: No thyromegaly present.  Cardiovascular: Normal rate, regular rhythm and intact distal pulses.  Murmur heard. Pacemaker Status post mitral valve replacement  2/6 S3 click     Pulmonary/Chest: Effort normal and breath sounds normal. No respiratory distress.  Abdominal: Soft. Bowel sounds are normal. He exhibits no distension. There is no tenderness.  Musculoskeletal: He exhibits no edema.  Is able to move all extremities scoliosis     Lymphadenopathy:    He has no cervical adenopathy.  Neurological: He is alert and oriented to person, place, and time.  Skin: Skin is warm and dry. He is not diaphoretic.  Psychiatric: He has a normal mood and affect.    ASSESSMENT/ PLAN:  TODAY:   1. Benign prostatic hyperplasia with urinary hesitancy: is stable will continue flomax 0.4 mg daily  2. Iron deficiency anemia due to  chronic blood loss: stable hgb 10.4: will continue iron every other day   3. Chronic angle-closure glaucoma, bilateral: is stable will continue alphagan both eyes twice daily and xalatan both eyes nightly   4. Major depressive disorder, recurrent episode, mild: is stable is currently not on medications; will continue melatonin 3 mg nightly    PREVIOUS  5. Coronary artery disease involving autologous vein coronary bypass graft with angina pectoris: is stable no complaints of chest pain; is status post cabg and mitral valve replacement    6. GERD without esophagitis: is stable will continue prilosec 40 mg twice daily  7. Chronic idiopathic constipation: is stable will continue colace 200 mg nightly and miralax daily   8. Permanent atrial fibrillation status post pacemaker and mitral valve replacement: heart rate is stable: will continue lopressor 25 mg twice daily for rate control is on long term coumadin therapy   9. Long term current use of anticoagulants with INR goal of 2.-3.5: will continue coumadin 5 mg nightly and will check INR on 10-30-18.   10. Chronic systolic heart failure: EF 35-40% (04-30-13): is stable will continue lasix 20 mg daily   11. Hypertensive heart disease with chronic systolic heart failure: is stable b/p 138/52: will continue cozaar 100 mg daily lopressor 25 mg twice daily      MD is aware of resident's narcotic use and is in agreement with current plan of care. We will attempt to wean resident as apropriate   Ok Edwards NP [facility]  Contact [phone] Monday through Friday 8am- 5pm  After hours call [phone]

## 2018-10-30 ENCOUNTER — Other Ambulatory Visit
Admission: RE | Admit: 2018-10-30 | Discharge: 2018-10-30 | Disposition: A | Discharge status: Home / Self Care | Payer: No Typology Code available for payment source | Source: Ambulatory Visit | Attending: Internal Medicine | Admitting: Internal Medicine

## 2018-10-30 DIAGNOSIS — I480 Paroxysmal atrial fibrillation: Secondary | ICD-10-CM | POA: Insufficient documentation

## 2018-10-31 ENCOUNTER — Other Ambulatory Visit
Admission: RE | Admit: 2018-10-31 | Discharge: 2018-10-31 | Disposition: A | Discharge status: Home / Self Care | Payer: Medicare Other | Source: Ambulatory Visit | Attending: Adult Health | Admitting: Adult Health

## 2018-10-31 ENCOUNTER — Encounter: Payer: Self-pay | Admitting: Adult Health

## 2018-10-31 ENCOUNTER — Non-Acute Institutional Stay (SKILLED_NURSING_FACILITY): Payer: Medicare Other | Admitting: Adult Health

## 2018-10-31 DIAGNOSIS — Z952 Presence of prosthetic heart valve: Secondary | ICD-10-CM

## 2018-10-31 DIAGNOSIS — I480 Paroxysmal atrial fibrillation: Secondary | ICD-10-CM | POA: Diagnosis present

## 2018-10-31 DIAGNOSIS — Z7901 Long term (current) use of anticoagulants: Secondary | ICD-10-CM

## 2018-10-31 DIAGNOSIS — I4821 Permanent atrial fibrillation: Secondary | ICD-10-CM | POA: Diagnosis not present

## 2018-10-31 LAB — PROTIME-INR
INR: 3.22
PROTHROMBIN TIME: 32.4 s — AB (ref 11.4–15.2)

## 2018-10-31 NOTE — Progress Notes (Signed)
Location:   [facility] Room Number: [place] of Service:  ALF (13)   CODE STATUS: DNR  Allergies  Allergen Reactions  . Cymbalta [Duloxetine Hcl] Other (See Comments)    Urinary retention, constipation and insomnia  . Aspirin     Heart operation was told to never take Aspirin    Chief Complaint  Patient presents with  . Acute Visit    Lab follow up    HPI:  He is on long term coumadin therapy for atrial fibrillation, is status post mitral valve replacement. He denies any chest pain or palpitations. He denies any blood in stools. There are no reports of missed coumadin doses. His INR today is 3.22 the goal is 2.5-3.0  Past Medical History:  Diagnosis Date  . Atrial fibrillation ([place])   . Cancer ([place])    melanoma- right ear  . Cardiomyopathy, secondary ([place])   . Chronic headaches started age 12  . Congestive heart failure (CHF) ([place])   . Coronary artery disease involving autologous vein coronary bypass graft with angina pectoris ([place]) 10/18/2018  . Coronary atherosclerosis of native coronary artery   . DJD (degenerative joint disease)   . Essential hypertension, benign   . GERD (gastroesophageal reflux disease)   . Glaucoma   . History of mitral valve replacement with mechanical valve   . Hyperlipidemia, unspecified   . Hypertension   . Lumbago   . Osteoporosis    secondary to low testoerone  . S/P MVR (mitral valve replacement)   . Sciatica   . Scoliosis     Past Surgical History:  Procedure Laterality Date  . CARDIOVERSION  2013  . CATARACT EXTRACTION, BILATERAL     x 2  . CORONARY ARTERY BYPASS GRAFT  12/2014   coronary artery bypass w/arterial grafts   . HERNIA REPAIR     x 2  . MELANOMA EXCISION     right ear  . MITRAL VALVE REPLACEMENT  1994   [facility]  . NASAL SEPTUM SURGERY  1963  . PERMANENT PACEMAKER INSERTION Left 12/22/2014    Social History   Socioeconomic History  . Marital status: Widowed    Spouse  name: Not on file  . Number of children: 0  . Years of education: college degree  . Highest education level: Not on file  Occupational History  . Not on file  Social Needs  . Financial resource strain: Not on file  . Food insecurity:    Worry: Not on file    Inability: Not on file  . Transportation needs:    Medical: Not on file    Non-medical: Not on file  Tobacco Use  . Smoking status: Never Smoker  . Smokeless tobacco: Never Used  Substance and Sexual Activity  . Alcohol use: No  . Drug use: No  . Sexual activity: Not on file  Lifestyle  . Physical activity:    Days per week: Not on file    Minutes per session: Not on file  . Stress: Not on file  Relationships  . Social connections:    Talks on phone: Not on file    Gets together: Not on file    Attends religious service: Not on file    Active member of club or organization: Not on file    Attends meetings of clubs or organizations: Not on file    Relationship status: Not on file  . Intimate partner violence:    Fear of  current or ex partner: Not on file    Emotionally abused: Not on file    Physically abused: Not on file    Forced sexual activity: Not on file  Other Topics Concern  . Not on file  Social History Narrative   Exercise; light 3 x week   Lives alone, widowed at [place] History  Problem Relation Age of Onset  . Cancer Father        unknown  . Alzheimer's disease Brother   . Arrhythmia Brother   . Mental illness Mother       VITAL SIGNS BP (!) 170/74   Pulse 63   Ht 5\' 7"  (1.702 m)   Wt 132 lb 6.4 oz (60.1 kg)   BMI 20.74 kg/m   Outpatient Encounter Medications as of 10/31/2018  Medication Sig  . acetaminophen (TYLENOL) 325 MG tablet Take 650 mg by mouth every 4 (four) hours as needed for fever. Maximum dose for 24 hours is 3000 mg from all sources of Acetaminophen/tylenol  . brimonidine (ALPHAGAN) 0.15 % ophthalmic solution Place 1 drop into both eyes 2 (two) times  daily.   . Cholecalciferol (VITAMIN D) 50 MCG (2000 UT) tablet Take 2,000 Units by mouth daily.  . Coenzyme Q10 10 MG capsule Take 100 mg by mouth 4 (four) times daily.  Marland Kitchen docusate sodium (COLACE) 100 MG capsule Take 2 capsules (200 mg total) by mouth at bedtime.  . ferrous sulfate 324 (65 FE) MG TBEC Take 1 tablet by mouth every other day.   . furosemide (LASIX) 20 MG tablet Take 1 tablet (20 mg total) by mouth daily.  Marland Kitchen latanoprost (XALATAN) 0.005 % ophthalmic solution Place 1 drop into both eyes at bedtime.   Marland Kitchen losartan (COZAAR) 100 MG tablet TAKE 1 TABLET BY MOUTH DAILY  . meclizine (ANTIVERT) 25 MG tablet Take 25 mg by mouth 3 (three) times daily as needed for dizziness.  . Melatonin 3 MG TABS Take 1 tablet by mouth at bedtime.   . metoprolol tartrate (LOPRESSOR) 25 MG tablet Take 25 mg by mouth 2 (two) times daily. Hold for BP less than 100/'s or pulse less than 60  . niacinamide 500 MG tablet Take 500 mg by mouth daily.  . NON FORMULARY Diet Type: regular  . Nutritional Supplements (ENSURE ENLIVE PO) Take 1 Bottle by mouth 2 (two) times daily.  Marland Kitchen omeprazole (PRILOSEC) 40 MG capsule Take 40 mg by mouth 2 (two) times daily.   . ondansetron (ZOFRAN-ODT) 4 MG disintegrating tablet Take 4 mg by mouth every 4 (four) hours as needed for nausea or vomiting.  . polyethylene glycol (MIRALAX / GLYCOLAX) packet Take 17 g by mouth daily.  Marland Kitchen RUTIN PO Take 500 mg by mouth daily.  . tamsulosin (FLOMAX) 0.4 MG CAPS capsule TAKE 1 CAPSULE BY MOUTH EVERY DAY  . warfarin (COUMADIN) 5 MG tablet 5 mg at bedtime.   . [DISCONTINUED] amitriptyline (ELAVIL) 25 MG tablet Take 1 tablet (25 mg total) by mouth at bedtime.   No facility-administered encounter medications on file as of 10/31/2018.      SIGNIFICANT DIAGNOSTIC EXAMS   LABS REVIEWED: PREVIOUS   06-19-18: wbc 4.3; hgb 10.9; hct 32.0; mcv 103.0; plt 205  10-17-18: wbc 5.2; hgb 10.4; hct 31.3; mcv 103.3 plt 196; glucose 126; bun 19; creat 0.94; k+  4.0; na++ 132; ca 9.1; liver normal albumin 4.0 INR 1.46 10-23-18 INR: 1.39   TODAY:   10-31-18:  INR 3.22     Review of Systems  Constitutional: Negative for malaise/fatigue.  Respiratory: Negative for cough and shortness of breath.   Cardiovascular: Negative for chest pain, palpitations and leg swelling.  Gastrointestinal: Negative for abdominal pain, constipation and heartburn.  Musculoskeletal: Negative for back pain, joint pain and myalgias.  Skin: Negative.   Neurological: Negative for dizziness.  Psychiatric/Behavioral: The patient is not nervous/anxious.     Physical Exam  Constitutional: He is oriented to person, place, and time. He appears well-developed and well-nourished. No distress.  Thin   Neck: No thyromegaly present.  Cardiovascular: Normal rate, regular rhythm and intact distal pulses.  Murmur heard. Pacemaker Status post mitral valve replacement  2/6 S3 click   Pulmonary/Chest: Effort normal and breath sounds normal. No respiratory distress.  Abdominal: Soft. Bowel sounds are normal. He exhibits no distension. There is no tenderness.  Musculoskeletal: He exhibits no edema.  Is able to move all extremities scoliosis   Lymphadenopathy:    He has no cervical adenopathy.  Neurological: He is alert and oriented to person, place, and time.  Skin: Skin is warm and dry. He is not diaphoretic.  Psychiatric: He has a normal mood and affect.    ASSESSMENT/ PLAN:  TODAY:   1. Atrial fibrillation 2. History of mitral valve replacement with mechanical valve   Will hold coumadin today will then begin 4 mg daily and will check INR on 11-07-18.    MD is aware of resident's narcotic use and is in agreement with current plan of care. We will attempt to wean resident as apropriate   Ok Edwards NP [facility]  Contact [phone] Monday through Friday 8am- 5pm  After hours call [phone]

## 2018-11-04 ENCOUNTER — Non-Acute Institutional Stay: Payer: Medicare Other | Admitting: Adult Health

## 2018-11-04 ENCOUNTER — Encounter: Payer: Self-pay | Admitting: Adult Health

## 2018-11-04 DIAGNOSIS — K219 Gastro-esophageal reflux disease without esophagitis: Secondary | ICD-10-CM | POA: Diagnosis not present

## 2018-11-04 DIAGNOSIS — K5904 Chronic idiopathic constipation: Secondary | ICD-10-CM

## 2018-11-04 DIAGNOSIS — I4821 Permanent atrial fibrillation: Secondary | ICD-10-CM | POA: Diagnosis not present

## 2018-11-04 DIAGNOSIS — I25719 Atherosclerosis of autologous vein coronary artery bypass graft(s) with unspecified angina pectoris: Secondary | ICD-10-CM | POA: Diagnosis not present

## 2018-11-04 NOTE — Progress Notes (Signed)
[facility]   [facility] Room Number: [place] of Service:  ALF (13)   CODE STATUS:  DNR  Allergies  Allergen Reactions  . Cymbalta [Duloxetine Hcl] Other (See Comments)    Urinary retention, constipation and insomnia  . Aspirin     Heart operation was told to never take Aspirin    Chief Complaint  Patient presents with  . Medical Management of Chronic Issues    Permanent atrial fibrillation; coronary artery disease involving autologous vein coronary bypass graft with angina pectoris; chronic idiopathic constipation  GERD without esophagitis. Weekly follow up for the first 30 days post admission.     HPI:  He is a 84 year old long term resident of assisted living being seen for the management of his chronic illnesses: afib; cad; constipation; gerd. He denies any heart burn no constipation; no uncontrolled pain.   Past Medical History:  Diagnosis Date  . Atrial fibrillation ([place])   . Cancer ([place])    melanoma- right ear  . Cardiomyopathy, secondary ([place])   . Chronic headaches started age 69  . Congestive heart failure (CHF) ([place])   . Coronary artery disease involving autologous vein coronary bypass graft with angina pectoris ([place]) 10/18/2018  . Coronary atherosclerosis of native coronary artery   . DJD (degenerative joint disease)   . Essential hypertension, benign   . GERD (gastroesophageal reflux disease)   . Glaucoma   . History of mitral valve replacement with mechanical valve   . Hyperlipidemia, unspecified   . Hypertension   . Lumbago   . Osteoporosis    secondary to low testoerone  . S/P MVR (mitral valve replacement)   . Sciatica   . Scoliosis     Past Surgical History:  Procedure Laterality Date  . CARDIOVERSION  2013  . CATARACT EXTRACTION, BILATERAL     x 2  . CORONARY ARTERY BYPASS GRAFT  12/2014   coronary artery bypass w/arterial grafts   . HERNIA REPAIR     x 2  . MELANOMA EXCISION     right ear  . MITRAL VALVE  REPLACEMENT  1994   [facility]  . NASAL SEPTUM SURGERY  1963  . PERMANENT PACEMAKER INSERTION Left 12/22/2014    Social History   Socioeconomic History  . Marital status: Widowed    Spouse name: Not on file  . Number of children: 0  . Years of education: college degree  . Highest education level: Not on file  Occupational History  . Not on file  Social Needs  . Financial resource strain: Not on file  . Food insecurity:    Worry: Not on file    Inability: Not on file  . Transportation needs:    Medical: Not on file    Non-medical: Not on file  Tobacco Use  . Smoking status: Never Smoker  . Smokeless tobacco: Never Used  Substance and Sexual Activity  . Alcohol use: No  . Drug use: No  . Sexual activity: Not on file  Lifestyle  . Physical activity:    Days per week: Not on file    Minutes per session: Not on file  . Stress: Not on file  Relationships  . Social connections:    Talks on phone: Not on file    Gets together: Not on file    Attends religious service: Not on file    Active member of club or organization: Not on file    Attends meetings of  clubs or organizations: Not on file    Relationship status: Not on file  . Intimate partner violence:    Fear of current or ex partner: Not on file    Emotionally abused: Not on file    Physically abused: Not on file    Forced sexual activity: Not on file  Other Topics Concern  . Not on file  Social History Narrative   Exercise; light 3 x week   Lives alone, widowed at [place] History  Problem Relation Age of Onset  . Cancer Father        unknown  . Alzheimer's disease Brother   . Arrhythmia Brother   . Mental illness Mother       VITAL SIGNS BP (!) 158/55   Pulse 70   Ht 5\' 7"  (1.702 m)   Wt 132 lb 6.4 oz (60.1 kg)   BMI 20.74 kg/m   Outpatient Encounter Medications as of 11/04/2018  Medication Sig  . acetaminophen (TYLENOL) 325 MG tablet Take 650 mg by mouth every 4 (four) hours  as needed for fever. Maximum dose for 24 hours is 3000 mg from all sources of Acetaminophen/tylenol  . brimonidine (ALPHAGAN) 0.15 % ophthalmic solution Place 1 drop into both eyes 2 (two) times daily.   . Cholecalciferol (VITAMIN D) 50 MCG (2000 UT) tablet Take 2,000 Units by mouth daily.  . Coenzyme Q10 10 MG capsule Take 100 mg by mouth 4 (four) times daily.  Marland Kitchen docusate sodium (COLACE) 100 MG capsule Take 2 capsules (200 mg total) by mouth at bedtime.  . ferrous sulfate 324 (65 FE) MG TBEC Take 1 tablet by mouth every other day.   . furosemide (LASIX) 20 MG tablet Take 1 tablet (20 mg total) by mouth daily.  Marland Kitchen latanoprost (XALATAN) 0.005 % ophthalmic solution Place 1 drop into both eyes at bedtime.   Marland Kitchen losartan (COZAAR) 100 MG tablet TAKE 1 TABLET BY MOUTH DAILY  . meclizine (ANTIVERT) 25 MG tablet Take 25 mg by mouth 3 (three) times daily as needed for dizziness.  . Melatonin 3 MG TABS Take 1 tablet by mouth at bedtime.   . metoprolol tartrate (LOPRESSOR) 25 MG tablet Take 25 mg by mouth 2 (two) times daily. Hold for BP less than 100/'s or pulse less than 60  . niacinamide 500 MG tablet Take 500 mg by mouth daily.  . NON FORMULARY Diet Type: regular  . Nutritional Supplements (ENSURE ENLIVE PO) Take 1 Bottle by mouth 2 (two) times daily.  Marland Kitchen omeprazole (PRILOSEC) 40 MG capsule Take 40 mg by mouth 2 (two) times daily.   . ondansetron (ZOFRAN-ODT) 4 MG disintegrating tablet Take 4 mg by mouth every 4 (four) hours as needed for nausea or vomiting.  . polyethylene glycol (MIRALAX / GLYCOLAX) packet Take 17 g by mouth daily.  Marland Kitchen RUTIN PO Take 500 mg by mouth daily.  . tamsulosin (FLOMAX) 0.4 MG CAPS capsule TAKE 1 CAPSULE BY MOUTH EVERY DAY  . warfarin (COUMADIN) 4 MG tablet Take 4 mg by mouth at bedtime.   . [DISCONTINUED] amitriptyline (ELAVIL) 25 MG tablet Take 1 tablet (25 mg total) by mouth at bedtime.   No facility-administered encounter medications on file as of 11/04/2018.       SIGNIFICANT DIAGNOSTIC EXAMS   LABS REVIEWED: PREVIOUS   06-19-18: wbc 4.3; hgb 10.9; hct 32.0; mcv 103.0; plt 205  10-17-18: wbc 5.2; hgb 10.4; hct 31.3; mcv 103.3 plt 196;  glucose 126; bun 19; creat 0.94; k+ 4.0; na++ 132; ca 9.1; liver normal albumin 4.0 INR 1.46 10-23-18 INR: 1.39  10-31-18: INR 3.22    NO NEW LABS.   Review of Systems  Constitutional: Negative for malaise/fatigue.  Respiratory: Negative for cough and shortness of breath.   Cardiovascular: Negative for chest pain, palpitations and leg swelling.  Gastrointestinal: Negative for abdominal pain, constipation and heartburn.  Musculoskeletal: Negative for back pain, joint pain and myalgias.  Skin: Negative.   Neurological: Negative for dizziness.  Psychiatric/Behavioral: The patient is not nervous/anxious.      Physical Exam  Constitutional: He is oriented to person, place, and time. He appears well-developed and well-nourished. No distress.  Thin   Neck: No thyromegaly present.  Cardiovascular: Normal rate, regular rhythm and intact distal pulses.  Murmur heard. Pacemaker Status post mitral valve replacement  2/6 S3 click    Pulmonary/Chest: Effort normal and breath sounds normal. No respiratory distress.  Abdominal: Soft. Bowel sounds are normal. He exhibits no distension. There is no tenderness.  Musculoskeletal: He exhibits no edema.  Is able to move all extremities scoliosis  Lymphadenopathy:    He has no cervical adenopathy.  Neurological: He is alert and oriented to person, place, and time.  Skin: Skin is warm and dry. He is not diaphoretic.  Psychiatric: He has a normal mood and affect.     ASSESSMENT/ PLAN:   TODAY:   1. Coronary artery disease involving autologous vein coronary bypass graft with angina pectoris: is stable no complaints of chest pain; is status post cabg and mitral valve replacement    2. GERD without esophagitis: is stable will continue prilosec 40 mg twice  daily  3. Chronic idiopathic constipation: is stable will continue colace 200 mg nightly and miralax daily   4. Permanent atrial fibrillation status post pacemaker and mitral valve replacement: heart rate is stable: will continue lopressor 25 mg twice daily for rate control is on long term coumadin therapy    PREVIOUS  5. Long term current use of anticoagulants with INR goal of 25.-3.5: will continue coumadin 4 mg nightly and will check INR on 11-10-18   6. Chronic systolic heart failure: EF 35-40% (04-30-13): is stable will continue lasix 20 mg daily   7. Hypertensive heart disease with chronic systolic heart failure: is stable b/p 138/52: will continue cozaar 100 mg daily lopressor 25 mg twice daily   8. Benign prostatic hyperplasia with urinary hesitancy: is stable will continue flomax 0.4 mg daily  9. Iron deficiency anemia due to chronic blood loss: stable hgb 10.4: will continue iron every other day   10. Chronic angle-closure glaucoma, bilateral: is stable will continue alphagan both eyes twice daily and xalatan both eyes nightly   11. Major depressive disorder, recurrent episode, mild: is stable is currently not on medications; will continue melatonin 3 mg nightly      MD is aware of resident's narcotic use and is in agreement with current plan of care. We will attempt to wean resident as apropriate   Ok Edwards NP [facility]  Contact [phone] Monday through Friday 8am- 5pm  After hours call [phone]

## 2018-11-10 ENCOUNTER — Other Ambulatory Visit
Admission: RE | Admit: 2018-11-10 | Discharge: 2018-11-10 | Disposition: A | Discharge status: Home / Self Care | Payer: No Typology Code available for payment source | Source: Skilled Nursing Facility | Attending: Internal Medicine | Admitting: Internal Medicine

## 2018-11-10 DIAGNOSIS — Z952 Presence of prosthetic heart valve: Secondary | ICD-10-CM | POA: Diagnosis present

## 2018-11-10 LAB — PROTIME-INR
INR: 3.34
Prothrombin Time: 33.4 seconds — ABNORMAL HIGH (ref 11.4–15.2)

## 2018-11-13 ENCOUNTER — Non-Acute Institutional Stay: Payer: Medicare Other | Admitting: Adult Health

## 2018-11-13 ENCOUNTER — Encounter: Payer: Self-pay | Admitting: Adult Health

## 2018-11-13 DIAGNOSIS — R3911 Hesitancy of micturition: Secondary | ICD-10-CM

## 2018-11-13 DIAGNOSIS — D500 Iron deficiency anemia secondary to blood loss (chronic): Secondary | ICD-10-CM | POA: Diagnosis not present

## 2018-11-13 DIAGNOSIS — N401 Enlarged prostate with lower urinary tract symptoms: Secondary | ICD-10-CM | POA: Diagnosis not present

## 2018-11-13 DIAGNOSIS — I110 Hypertensive heart disease with heart failure: Secondary | ICD-10-CM

## 2018-11-13 DIAGNOSIS — I5022 Chronic systolic (congestive) heart failure: Secondary | ICD-10-CM | POA: Diagnosis not present

## 2018-11-13 NOTE — Progress Notes (Signed)
[facility]:   [facility] [place] Room Number: [id number] Place of Service:  ALF (13)   CODE STATUS: DNR  Allergies  Allergen Reactions  . Cymbalta [Duloxetine Hcl] Other (See Comments)    Urinary retention, constipation and insomnia  . Aspirin     Heart operation was told to never take Aspirin    Chief Complaint  Patient presents with  . Medical Management of Chronic Issues    Chronic systolic heart failure; hypertensive heart disease with chronic systolic congestive heart failure; benign prostatic hyperplasia with urinary hesitancy; iron deficiency anemia dye to to chronic blood loss.     HPI:  He is a 84 year old long term resident of assisted living being seen for the management of his chronic illnesses; systolic heart failure; hypertensive heart disease; bph; iron def anemia. He denies any chest pain;no leg swelling; no difficulty urinating.   Past Medical History:  Diagnosis Date  . Atrial fibrillation ([place])   . Cancer ([place])    melanoma- right ear  . Cardiomyopathy, secondary ([place])   . Chronic headaches started age 64  . Congestive heart failure (CHF) ([place])   . Coronary artery disease involving autologous vein coronary bypass graft with angina pectoris ([place]) 10/18/2018  . Coronary atherosclerosis of native coronary artery   . DJD (degenerative joint disease)   . Essential hypertension, benign   . GERD (gastroesophageal reflux disease)   . Glaucoma   . History of mitral valve replacement with mechanical valve   . Hyperlipidemia, unspecified   . Hypertension   . Lumbago   . Osteoporosis    secondary to low testoerone  . S/P MVR (mitral valve replacement)   . Sciatica   . Scoliosis     Past Surgical History:  Procedure Laterality Date  . CARDIOVERSION  2013  . CATARACT EXTRACTION, BILATERAL     x 2  . CORONARY ARTERY BYPASS GRAFT  12/2014   coronary artery bypass w/arterial grafts   . HERNIA REPAIR     x 2  . MELANOMA EXCISION     right ear    . MITRAL VALVE REPLACEMENT  1994   [facility]  . NASAL SEPTUM SURGERY  1963  . PERMANENT PACEMAKER INSERTION Left 12/22/2014    Social History   Socioeconomic History  . Marital status: Widowed    Spouse name: Not on file  . Number of children: 0  . Years of education: college degree  . Highest education level: Not on file  Occupational History  . Not on file  Social Needs  . Financial resource strain: Not on file  . Food insecurity:    Worry: Not on file    Inability: Not on file  . Transportation needs:    Medical: Not on file    Non-medical: Not on file  Tobacco Use  . Smoking status: Never Smoker  . Smokeless tobacco: Never Used  Substance and Sexual Activity  . Alcohol use: No  . Drug use: No  . Sexual activity: Not on file  Lifestyle  . Physical activity:    Days per week: Not on file    Minutes per session: Not on file  . Stress: Not on file  Relationships  . Social connections:    Talks on phone: Not on file    Gets together: Not on file    Attends religious service: Not on file    Active member of club or organization: Not on file    Attends meetings  of clubs or organizations: Not on file    Relationship status: Not on file  . Intimate partner violence:    Fear of current or ex partner: Not on file    Emotionally abused: Not on file    Physically abused: Not on file    Forced sexual activity: Not on file  Other Topics Concern  . Not on file  Social History Narrative   Exercise; light 3 x week   Lives alone, widowed at [place] History  Problem Relation Age of Onset  . Cancer Father        unknown  . Alzheimer's disease Brother   . Arrhythmia Brother   . Mental illness Mother       VITAL SIGNS BP (!) 175/71   Pulse 63   Ht 5\' 7"  (1.702 m)   Wt 136 lb 8 oz (61.9 kg)   BMI 21.38 kg/m   Outpatient Encounter Medications as of 11/13/2018  Medication Sig  . acetaminophen (TYLENOL) 325 MG tablet Take 650 mg by mouth every 4  (four) hours as needed for fever. Maximum dose for 24 hours is 3000 mg from all sources of Acetaminophen/tylenol  . brimonidine (ALPHAGAN) 0.15 % ophthalmic solution Place 1 drop into both eyes 2 (two) times daily.   . Cholecalciferol (VITAMIN D) 50 MCG (2000 UT) tablet Take 2,000 Units by mouth daily.  . Coenzyme Q10 10 MG capsule Take 100 mg by mouth 4 (four) times daily.  Marland Kitchen docusate sodium (COLACE) 100 MG capsule Take 2 capsules (200 mg total) by mouth at bedtime.  . ferrous sulfate 324 (65 FE) MG TBEC Take 1 tablet by mouth every other day.   . furosemide (LASIX) 20 MG tablet Take 1 tablet (20 mg total) by mouth daily.  Marland Kitchen latanoprost (XALATAN) 0.005 % ophthalmic solution Place 1 drop into both eyes at bedtime.   Marland Kitchen losartan (COZAAR) 100 MG tablet TAKE 1 TABLET BY MOUTH DAILY  . meclizine (ANTIVERT) 25 MG tablet Take 25 mg by mouth 3 (three) times daily as needed for dizziness.  . Melatonin 3 MG TABS Take 1 tablet by mouth at bedtime.   . metoprolol tartrate (LOPRESSOR) 25 MG tablet Take 25 mg by mouth 2 (two) times daily. Hold for BP less than 100/'s or pulse less than 60  . niacinamide 500 MG tablet Take 500 mg by mouth daily.  . NON FORMULARY Diet Type: regular  . Nutritional Supplements (ENSURE ENLIVE PO) Take 1 Bottle by mouth 2 (two) times daily.  Marland Kitchen omeprazole (PRILOSEC) 40 MG capsule Take 40 mg by mouth 2 (two) times daily.   . ondansetron (ZOFRAN-ODT) 4 MG disintegrating tablet Take 4 mg by mouth every 4 (four) hours as needed for nausea or vomiting.  . polyethylene glycol (MIRALAX / GLYCOLAX) packet Take 17 g by mouth daily.  Marland Kitchen RUTIN PO Take 500 mg by mouth daily.  . tamsulosin (FLOMAX) 0.4 MG CAPS capsule TAKE 1 CAPSULE BY MOUTH EVERY DAY  . warfarin (COUMADIN) 4 MG tablet Take 4 mg by mouth at bedtime.   . [DISCONTINUED] amitriptyline (ELAVIL) 25 MG tablet Take 1 tablet (25 mg total) by mouth at bedtime.   No facility-administered encounter medications on file as of 11/13/2018.       SIGNIFICANT DIAGNOSTIC EXAMS  LABS REVIEWED: PREVIOUS   06-19-18: wbc 4.3; hgb 10.9; hct 32.0; mcv 103.0; plt 205  10-17-18: wbc 5.2; hgb 10.4; hct 31.3; mcv 103.3 plt 196;  glucose 126; bun 19; creat 0.94; k+ 4.0; na++ 132; ca 9.1; liver normal albumin 4.0 INR 1.46 10-23-18 INR: 1.39  10-31-18: INR 3.22    TODAY:   11-10-18: inr 3.34: on 4 mg coumadin     Review of Systems  Constitutional: Negative for malaise/fatigue.  Respiratory: Negative for cough and shortness of breath.   Cardiovascular: Negative for chest pain, palpitations and leg swelling.  Gastrointestinal: Negative for abdominal pain, constipation and heartburn.  Musculoskeletal: Negative for back pain, joint pain and myalgias.  Skin: Negative.   Neurological: Negative for dizziness.  Psychiatric/Behavioral: The patient is not nervous/anxious.     Physical Exam  Constitutional: He is oriented to person, place, and time. He appears well-developed and well-nourished. No distress.  Thin   Neck: No thyromegaly present.  Cardiovascular: Normal rate, regular rhythm and intact distal pulses.  Murmur heard. Pacemaker Status post mitral valve replacement  2/6 S3 click     Pulmonary/Chest: Effort normal and breath sounds normal. No respiratory distress.  Abdominal: Soft. Bowel sounds are normal. He exhibits no distension. There is no tenderness.  Musculoskeletal: He exhibits no edema.  Is able to move all extremities scoliosis   Lymphadenopathy:    He has no cervical adenopathy.  Neurological: He is alert and oriented to person, place, and time.  Skin: Skin is warm and dry. He is not diaphoretic.  Psychiatric: He has a normal mood and affect.      ASSESSMENT/ PLAN:  TODAY:   1. Chronic systolic heart failure: EF 35-40% (04-30-13): is stable will continue lasix 20 mg daily   2. Hypertensive heart disease with chronic systolic heart failure: is stable b/p 175/71: will continue cozaar 100 mg daily lopressor 25  mg twice daily   3. Benign prostatic hyperplasia with urinary hesitancy: is stable will continue flomax 0.4 mg daily  4. Iron deficiency anemia due to chronic blood loss: stable hgb 10.4: will continue iron every other day   PREVIOUS  5. Chronic angle-closure glaucoma, bilateral: is stable will continue alphagan both eyes twice daily and xalatan both eyes nightly   6. Major depressive disorder, recurrent episode, mild: is stable is currently not on medications; will continue melatonin 3 mg nightly   7. Coronary artery disease involving autologous vein coronary bypass graft with angina pectoris: is stable no complaints of chest pain; is status post cabg and mitral valve replacement is on long term coumadin therapy    8. GERD without esophagitis: is stable will continue prilosec 40 mg twice daily  9. Chronic idiopathic constipation: is stable will continue colace 200 mg nightly and miralax daily   10. Permanent atrial fibrillation status post pacemaker and mitral valve replacement: heart rate is stable: will continue lopressor 25 mg twice daily for rate control is on long term coumadin therapy        MD is aware of resident's narcotic use and is in agreement with current plan of care. We will attempt to wean resident as apropriate   Ok Edwards NP [facility]  Contact [phone] Monday through Friday 8am- 5pm  After hours call [phone]

## 2018-12-11 ENCOUNTER — Encounter: Payer: Self-pay | Admitting: Adult Health

## 2018-12-11 ENCOUNTER — Non-Acute Institutional Stay: Payer: Medicare Other | Admitting: Adult Health

## 2018-12-11 ENCOUNTER — Other Ambulatory Visit
Admission: RE | Admit: 2018-12-11 | Discharge: 2018-12-11 | Disposition: A | Discharge status: Home / Self Care | Payer: Medicare PPO | Source: Ambulatory Visit | Attending: Adult Health | Admitting: Adult Health

## 2018-12-11 DIAGNOSIS — Z7901 Long term (current) use of anticoagulants: Secondary | ICD-10-CM

## 2018-12-11 DIAGNOSIS — Z952 Presence of prosthetic heart valve: Secondary | ICD-10-CM

## 2018-12-11 DIAGNOSIS — I4821 Permanent atrial fibrillation: Secondary | ICD-10-CM | POA: Diagnosis not present

## 2018-12-11 LAB — PROTIME-INR
INR: 3.15
Prothrombin Time: 31.9 seconds — ABNORMAL HIGH (ref 11.4–15.2)

## 2018-12-11 NOTE — Progress Notes (Signed)
[facility]:   [facility] Room Number: [place] of Service:  ALF (13)   CODE STATUS: DNR  Allergies  Allergen Reactions  . Cymbalta [Duloxetine Hcl] Other (See Comments)    Urinary retention, constipation and insomnia  . Aspirin     Heart operation was told to never take Aspirin    Chief Complaint  Patient presents with  . Acute Visit    INR Follow up    HPI:  He is a 85 year old man who is on chronic coumadin therapy for afib status post mitral valve replacement. He denies any chest pain; no palpitations; no shortness of breath. There are reports of missed coumadin doses; no reports of abnormal bleeding.   Past Medical History:  Diagnosis Date  . Atrial fibrillation ([place])   . Cancer ([place])    melanoma- right ear  . Cardiomyopathy, secondary ([place])   . Chronic headaches started age 36  . Congestive heart failure (CHF) ([place])   . Coronary artery disease involving autologous vein coronary bypass graft with angina pectoris ([place]) 10/18/2018  . Coronary atherosclerosis of native coronary artery   . DJD (degenerative joint disease)   . Essential hypertension, benign   . GERD (gastroesophageal reflux disease)   . Glaucoma   . History of mitral valve replacement with mechanical valve   . Hyperlipidemia, unspecified   . Hypertension   . Lumbago   . Osteoporosis    secondary to low testoerone  . S/P MVR (mitral valve replacement)   . Sciatica   . Scoliosis     Past Surgical History:  Procedure Laterality Date  . CARDIOVERSION  2013  . CATARACT EXTRACTION, BILATERAL     x 2  . CORONARY ARTERY BYPASS GRAFT  12/2014   coronary artery bypass w/arterial grafts   . HERNIA REPAIR     x 2  . MELANOMA EXCISION     right ear  . MITRAL VALVE REPLACEMENT  1994   [facility]  . NASAL SEPTUM SURGERY  1963  . PERMANENT PACEMAKER INSERTION Left 12/22/2014    Social History   Socioeconomic History  . Marital status: Widowed    Spouse name: Not on file   . Number of children: 0  . Years of education: college degree  . Highest education level: Not on file  Occupational History  . Not on file  Social Needs  . Financial resource strain: Not on file  . Food insecurity:    Worry: Not on file    Inability: Not on file  . Transportation needs:    Medical: Not on file    Non-medical: Not on file  Tobacco Use  . Smoking status: Never Smoker  . Smokeless tobacco: Never Used  Substance and Sexual Activity  . Alcohol use: No  . Drug use: No  . Sexual activity: Not on file  Lifestyle  . Physical activity:    Days per week: Not on file    Minutes per session: Not on file  . Stress: Not on file  Relationships  . Social connections:    Talks on phone: Not on file    Gets together: Not on file    Attends religious service: Not on file    Active member of club or organization: Not on file    Attends meetings of clubs or organizations: Not on file    Relationship status: Not on file  . Intimate partner violence:    Fear of current or  ex partner: Not on file    Emotionally abused: Not on file    Physically abused: Not on file    Forced sexual activity: Not on file  Other Topics Concern  . Not on file  Social History Narrative   Exercise; light 3 x week   Lives alone, widowed at [place] History  Problem Relation Age of Onset  . Cancer Father        unknown  . Alzheimer's disease Brother   . Arrhythmia Brother   . Mental illness Mother       VITAL SIGNS BP 127/68   Pulse 60   Temp 98.1 F (36.7 C)   Resp 20   Ht 5\' 7"  (1.702 m)   Wt 134 lb 4.8 oz (60.9 kg)   SpO2 98%   BMI 21.03 kg/m   Outpatient Encounter Medications as of 12/11/2018  Medication Sig  . acetaminophen (TYLENOL) 325 MG tablet Take 650 mg by mouth every 4 (four) hours as needed for fever. Maximum dose for 24 hours is 3000 mg from all sources of Acetaminophen/tylenol  . brimonidine (ALPHAGAN) 0.15 % ophthalmic solution Place 1 drop into  both eyes 2 (two) times daily.   . Cholecalciferol (VITAMIN D) 50 MCG (2000 UT) tablet Take 2,000 Units by mouth daily.  . Coenzyme Q10 10 MG capsule Take 100 mg by mouth 4 (four) times daily.  Marland Kitchen docusate sodium (COLACE) 100 MG capsule Take 2 capsules (200 mg total) by mouth at bedtime.  . ferrous sulfate 325 (65 FE) MG tablet Take 1 tablet by mouth every other day.   . furosemide (LASIX) 20 MG tablet Take 1 tablet (20 mg total) by mouth daily.  Marland Kitchen latanoprost (XALATAN) 0.005 % ophthalmic solution Place 1 drop into both eyes at bedtime.   Marland Kitchen losartan (COZAAR) 100 MG tablet TAKE 1 TABLET BY MOUTH DAILY  . meclizine (ANTIVERT) 25 MG tablet Take 25 mg by mouth 3 (three) times daily as needed for dizziness.  . Melatonin 3 MG TABS Take 1 tablet by mouth at bedtime.   . metoprolol tartrate (LOPRESSOR) 25 MG tablet Take 25 mg by mouth 2 (two) times daily. Hold for BP less than 100/'s or pulse less than 60  . niacinamide 500 MG tablet Take 500 mg by mouth daily.  . NON FORMULARY Diet Type: regular  . Nutritional Supplements (ENSURE ENLIVE PO) Take 1 Bottle by mouth 2 (two) times daily.  Marland Kitchen omeprazole (PRILOSEC) 40 MG capsule Take 40 mg by mouth 2 (two) times daily.   . ondansetron (ZOFRAN-ODT) 4 MG disintegrating tablet Take 4 mg by mouth every 4 (four) hours as needed for nausea or vomiting.  . polyethylene glycol (MIRALAX / GLYCOLAX) packet Take 17 g by mouth daily.  Marland Kitchen RUTIN PO Take 500 mg by mouth daily.  . tamsulosin (FLOMAX) 0.4 MG CAPS capsule TAKE 1 CAPSULE BY MOUTH EVERY DAY  . warfarin (COUMADIN) 4 MG tablet Take 4 mg by mouth at bedtime.   . [DISCONTINUED] amitriptyline (ELAVIL) 25 MG tablet Take 1 tablet (25 mg total) by mouth at bedtime.   No facility-administered encounter medications on file as of 12/11/2018.      SIGNIFICANT DIAGNOSTIC EXAMS  LABS REVIEWED: PREVIOUS   06-19-18: wbc 4.3; hgb 10.9; hct 32.0; mcv 103.0; plt 205  10-17-18: wbc 5.2; hgb 10.4; hct 31.3; mcv 103.3 plt 196;  glucose 126; bun 19; creat 0.94; k+ 4.0; na++ 132; ca 9.1; liver  normal albumin 4.0 INR 1.46 10-23-18 INR: 1.39  10-31-18: INR 3.22   11-10-18: inr 3.34: on 4 mg coumadin    TODAY:  12-11-18: INR 3.14 on coumadin 4 mg    Review of Systems  Constitutional: Negative for malaise/fatigue.  Respiratory: Negative for cough and shortness of breath.   Cardiovascular: Negative for chest pain, palpitations and leg swelling.  Gastrointestinal: Negative for abdominal pain, constipation and heartburn.  Musculoskeletal: Negative for back pain, joint pain and myalgias.  Skin: Negative.   Neurological: Negative for dizziness.  Psychiatric/Behavioral: The patient is not nervous/anxious.      Physical Exam Constitutional:      General: He is not in acute distress.    Appearance: He is well-developed. He is not diaphoretic.     Comments: thin  Neck:     Thyroid: No thyromegaly.  Cardiovascular:     Rate and Rhythm: Normal rate and regular rhythm.     Pulses: Normal pulses.     Heart sounds: Murmur present.     Comments: Pacemaker Status post mitral valve replacement  2/6 S3 click     Pulmonary:     Effort: Pulmonary effort is normal. No respiratory distress.     Breath sounds: Normal breath sounds.  Abdominal:     General: Bowel sounds are normal. There is no distension.     Palpations: Abdomen is soft.     Tenderness: There is no abdominal tenderness.  Musculoskeletal:     Right lower leg: No edema.     Left lower leg: No edema.     Comments: Is able to move all extremities scoliosis   Lymphadenopathy:     Cervical: No cervical adenopathy.  Skin:    General: Skin is warm and dry.  Neurological:     Mental Status: He is alert and oriented to person, place, and time.  Psychiatric:        Mood and Affect: Mood normal.        ASSESSMENT/ PLAN:  TODAY:   1. Permanent atrial fibrillation status post pacemaker and mitral valve replacement: heart rate is stable: will continue  lopressor 25 mg twice daily for rate control is on long term coumadin therapy  2.  Long term current use of anticoagulants with INR goal of 2.5-3.5: will continue coumadin 5 mg daily and will check INR on 01-12-19.    MD is aware of resident's narcotic use and is in agreement with current plan of care. We will attempt to wean resident as apropriate   Ok Edwards NP [facility]  Contact [phone] Monday through Friday 8am- 5pm  After hours call [phone]

## 2018-12-22 ENCOUNTER — Other Ambulatory Visit: Payer: Self-pay | Admitting: Internal Medicine

## 2018-12-22 ENCOUNTER — Encounter
Admission: RE | Admit: 2018-12-22 | Discharge: 2018-12-22 | Disposition: A | Discharge status: Home / Self Care | Payer: No Typology Code available for payment source | Source: Ambulatory Visit | Attending: Internal Medicine | Admitting: Internal Medicine

## 2018-12-23 ENCOUNTER — Other Ambulatory Visit: Payer: Self-pay | Admitting: Internal Medicine

## 2019-01-10 ENCOUNTER — Encounter
Admission: RE | Admit: 2019-01-10 | Discharge: 2019-01-10 | Disposition: A | Discharge status: Home / Self Care | Payer: No Typology Code available for payment source | Source: Ambulatory Visit | Attending: Internal Medicine | Admitting: Internal Medicine

## 2019-01-12 ENCOUNTER — Other Ambulatory Visit
Admission: RE | Admit: 2019-01-12 | Discharge: 2019-01-12 | Disposition: A | Discharge status: Home / Self Care | Payer: Medicare PPO | Source: Ambulatory Visit | Attending: Adult Health | Admitting: Adult Health

## 2019-01-12 ENCOUNTER — Non-Acute Institutional Stay: Payer: Medicare PPO | Admitting: Adult Health

## 2019-01-12 ENCOUNTER — Encounter: Payer: Self-pay | Admitting: Adult Health

## 2019-01-12 DIAGNOSIS — F330 Major depressive disorder, recurrent, mild: Secondary | ICD-10-CM

## 2019-01-12 DIAGNOSIS — Z952 Presence of prosthetic heart valve: Secondary | ICD-10-CM | POA: Diagnosis present

## 2019-01-12 DIAGNOSIS — H402230 Chronic angle-closure glaucoma, bilateral, stage unspecified: Secondary | ICD-10-CM | POA: Diagnosis not present

## 2019-01-12 DIAGNOSIS — I25719 Atherosclerosis of autologous vein coronary artery bypass graft(s) with unspecified angina pectoris: Secondary | ICD-10-CM

## 2019-01-12 DIAGNOSIS — Z7901 Long term (current) use of anticoagulants: Secondary | ICD-10-CM

## 2019-01-12 LAB — PROTIME-INR
INR: 2.92
Prothrombin Time: 30.1 seconds — ABNORMAL HIGH (ref 11.4–15.2)

## 2019-01-12 NOTE — Progress Notes (Signed)
[facility]:   [facility] Room Number: [place] of Service:  SNF (31)   CODE STATUS: DNR  Allergies  Allergen Reactions  . Cymbalta [Duloxetine Hcl] Other (See Comments)    Urinary retention, constipation and insomnia  . Aspirin     Heart operation was told to never take Aspirin    Chief Complaint  Patient presents with  . Medical Management of Chronic Issues    Coronary artery disease involving autologous vein coronary bypass graft with angina pectoris; chronic angle-closure glaucoma of both eyes unspecified glaucoma stage; mild episode recurrent major depressive disorder.     HPI:  Kevin [name] is a 85 year old long term resident of assisted living being seen for the management of his chronic illnesses: cad; glaucoma; depression. His INR today is 2.92. Kevin [name] denies any changes in appetite; no anxiety or depressive thoughts. Kevin [name] denies any uncontrolled pain   Past Medical History:  Diagnosis Date  . Atrial fibrillation ([place])   . Cancer ([place])    melanoma- right ear  . Cardiomyopathy, secondary ([place])   . Chronic headaches started age 56  . Congestive heart failure (CHF) ([place])   . Coronary artery disease involving autologous vein coronary bypass graft with angina pectoris ([place]) 10/18/2018  . Coronary atherosclerosis of native coronary artery   . DJD (degenerative joint disease)   . Essential hypertension, benign   . GERD (gastroesophageal reflux disease)   . Glaucoma   . History of mitral valve replacement with mechanical valve   . Hyperlipidemia, unspecified   . Hypertension   . Lumbago   . Osteoporosis    secondary to low testoerone  . S/P MVR (mitral valve replacement)   . Sciatica   . Scoliosis     Past Surgical History:  Procedure Laterality Date  . CARDIOVERSION  2013  . CATARACT EXTRACTION, BILATERAL     x 2  . CORONARY ARTERY BYPASS GRAFT  12/2014   coronary artery bypass w/arterial grafts   . HERNIA REPAIR     x 2  . MELANOMA EXCISION       right ear  . MITRAL VALVE REPLACEMENT  1994   [facility]  . NASAL SEPTUM SURGERY  1963  . PERMANENT PACEMAKER INSERTION Left 12/22/2014    Social History   Socioeconomic History  . Marital status: Widowed    Spouse name: Not on file  . Number of children: 0  . Years of education: college degree  . Highest education level: Not on file  Occupational History  . Not on file  Social Needs  . Financial resource strain: Not on file  . Food insecurity:    Worry: Not on file    Inability: Not on file  . Transportation needs:    Medical: Not on file    Non-medical: Not on file  Tobacco Use  . Smoking status: Never Smoker  . Smokeless tobacco: Never Used  Substance and Sexual Activity  . Alcohol use: No  . Drug use: No  . Sexual activity: Not on file  Lifestyle  . Physical activity:    Days per week: Not on file    Minutes per session: Not on file  . Stress: Not on file  Relationships  . Social connections:    Talks on phone: Not on file    Gets together: Not on file    Attends religious service: Not on file    Active member of club or organization: Not on file  Attends meetings of clubs or organizations: Not on file    Relationship status: Not on file  . Intimate partner violence:    Fear of current or ex partner: Not on file    Emotionally abused: Not on file    Physically abused: Not on file    Forced sexual activity: Not on file  Other Topics Concern  . Not on file  Social History Narrative   Exercise; light 3 x week   Lives alone, widowed at [place] History  Problem Relation Age of Onset  . Cancer Father        unknown  . Alzheimer's disease Brother   . Arrhythmia Brother   . Mental illness Mother       VITAL SIGNS BP (!) 146/53   Pulse 60   Temp 98.1 F (36.7 C)   Resp 18   Ht 5\' 7"  (1.702 m)   Wt 134 lb 14.4 oz (61.2 kg)   SpO2 96%   BMI 21.13 kg/m   Outpatient Encounter Medications as of 01/12/2019  Medication Sig  .  acetaminophen (TYLENOL) 325 MG tablet Take 650 mg by mouth every 4 (four) hours as needed for fever. Maximum dose for 24 hours is 3000 mg from all sources of Acetaminophen/tylenol  . brimonidine (ALPHAGAN) 0.15 % ophthalmic solution Place 1 drop into both eyes 2 (two) times daily.   . Cholecalciferol (VITAMIN D) 50 MCG (2000 UT) tablet Take 2,000 Units by mouth daily.  . Coenzyme Q10 10 MG capsule Take 100 mg by mouth 4 (four) times daily.  Marland Kitchen docusate sodium (COLACE) 100 MG capsule Take 2 capsules (200 mg total) by mouth at bedtime.  . ferrous sulfate 325 (65 FE) MG tablet Take 1 tablet by mouth every other day.   . furosemide (LASIX) 20 MG tablet Take 1 tablet (20 mg total) by mouth daily.  Marland Kitchen latanoprost (XALATAN) 0.005 % ophthalmic solution Place 1 drop into both eyes at bedtime.   Marland Kitchen losartan (COZAAR) 100 MG tablet TAKE 1 TABLET BY MOUTH DAILY  . meclizine (ANTIVERT) 25 MG tablet Take 25 mg by mouth 3 (three) times daily as needed for dizziness.  . Melatonin 3 MG TABS Take 1 tablet by mouth at bedtime.   . metoprolol tartrate (LOPRESSOR) 25 MG tablet Take 25 mg by mouth 2 (two) times daily. Hold for BP less than 100/'s or pulse less than 60  . niacinamide 500 MG tablet Take 500 mg by mouth daily.  . NON FORMULARY Diet Type: regular  . Nutritional Supplements (ENSURE ENLIVE PO) Take 1 Bottle by mouth 2 (two) times daily.  Marland Kitchen omeprazole (PRILOSEC) 40 MG capsule TAKE 1 CAPSULE(40 MG) BY MOUTH DAILY  . ondansetron (ZOFRAN-ODT) 4 MG disintegrating tablet Take 4 mg by mouth every 4 (four) hours as needed for nausea or vomiting.  . polyethylene glycol (MIRALAX / GLYCOLAX) packet Take 17 g by mouth daily.  . tamsulosin (FLOMAX) 0.4 MG CAPS capsule TAKE 1 CAPSULE BY MOUTH EVERY DAY  . warfarin (COUMADIN) 4 MG tablet Take 4 mg by mouth at bedtime.   . [DISCONTINUED] amitriptyline (ELAVIL) 25 MG tablet Take 1 tablet (25 mg total) by mouth at bedtime.  . [DISCONTINUED] RUTIN PO Take 500 mg by mouth daily.     No facility-administered encounter medications on file as of 01/12/2019.      SIGNIFICANT DIAGNOSTIC EXAMS  LABS REVIEWED: PREVIOUS   06-19-18: wbc 4.3; hgb 10.9; hct 32.0;  mcv 103.0; plt 205  10-17-18: wbc 5.2; hgb 10.4; hct 31.3; mcv 103.3 plt 196; glucose 126; bun 19; creat 0.94; k+ 4.0; na++ 132; ca 9.1; liver normal albumin 4.0 INR 1.46 10-23-18 INR: 1.39  10-31-18: INR 3.22   11-10-18: inr 3.34: on 4 mg coumadin   12-11-18: INR 3.14 on coumadin 4 mg  TODAY:   01-12-19: INR 2.92; coumadin 4 mg    Review of Systems  Constitutional: Negative for malaise/fatigue.  Respiratory: Negative for cough and shortness of breath.   Cardiovascular: Negative for chest pain, palpitations and leg swelling.  Gastrointestinal: Negative for abdominal pain, constipation and heartburn.  Musculoskeletal: Negative for back pain, joint pain and myalgias.  Skin: Negative.   Neurological: Negative for dizziness.  Psychiatric/Behavioral: The patient is not nervous/anxious.       Physical Exam Constitutional:      General: Kevin [name] is not in acute distress.    Appearance: Kevin [name] is well-developed. Kevin [name] is not diaphoretic.     Comments: thin  Neck:     Musculoskeletal: Neck supple.     Thyroid: No thyromegaly.  Cardiovascular:     Rate and Rhythm: Normal rate and regular rhythm.     Pulses: Normal pulses.     Heart sounds: Murmur present.     Comments: Pacemaker Status post mitral valve replacement  2/6 S3 click     Pulmonary:     Effort: Pulmonary effort is normal. No respiratory distress.     Breath sounds: Normal breath sounds.  Abdominal:     General: Bowel sounds are normal. There is no distension.     Palpations: Abdomen is soft.     Tenderness: There is no abdominal tenderness.  Musculoskeletal:     Right lower leg: No edema.     Left lower leg: No edema.     Comments: Is able to move all extremities scoliosis    Lymphadenopathy:     Cervical: No cervical adenopathy.  Skin:    General:  Skin is warm and dry.  Neurological:     Mental Status: Kevin [name] is alert and oriented to person, place, and time.  Psychiatric:        Behavior: Behavior normal.     ASSESSMENT/ PLAN:  TODAY:   1. Chronic angle-closure glaucoma, bilateral: is stable will continue alphagan both eyes twice daily and xalatan both eyes nightly   2. Major depressive disorder, recurrent episode, mild: is stable is currently not on medications; will continue melatonin 3 mg nightly   3. Coronary artery disease involving autologous vein coronary bypass graft with angina pectoris: is stable no complaints of chest pain; is status post cabg and mitral valve replacement is on long term coumadin therapy   4. Long term current use of anticoagulants with INR goal of 2.5-3.5: is stable his INR is 2.92; will continue coumadin 4 mg daily and will check INR on 02-09-19    PREVIOUS   5. GERD without esophagitis: is stable will continue prilosec 40 mg twice daily  6. Chronic idiopathic constipation: is stable will continue colace 200 mg nightly and miralax daily   7. Permanent atrial fibrillation status post pacemaker and mitral valve replacement: heart rate is stable: will continue lopressor 25 mg twice daily for rate control is on long term coumadin therapy   8. Chronic systolic heart failure: EF 35-40% (04-30-13): is stable will continue lasix 20 mg daily   9. Hypertensive heart disease with chronic systolic heart failure: is stable b/p 146/53: will continue  cozaar 100 mg daily lopressor 25 mg twice daily   10. Benign prostatic hyperplasia with urinary hesitancy: is stable will continue flomax 0.4 mg daily  11. Iron deficiency anemia due to chronic blood loss: stable hgb 10.4: will continue iron every other day      MD is aware of resident's narcotic use and is in agreement with current plan of care. We will attempt to wean resident as apropriate   Ok Edwards NP [facility]  Contact  [phone] Monday through Friday 8am- 5pm  After hours call [phone]

## 2019-01-21 ENCOUNTER — Encounter: Payer: Self-pay | Admitting: Adult Health

## 2019-01-21 ENCOUNTER — Non-Acute Institutional Stay: Payer: Medicare PPO | Admitting: Adult Health

## 2019-01-21 DIAGNOSIS — I4821 Permanent atrial fibrillation: Secondary | ICD-10-CM | POA: Diagnosis not present

## 2019-01-21 DIAGNOSIS — K5904 Chronic idiopathic constipation: Secondary | ICD-10-CM | POA: Diagnosis not present

## 2019-01-21 DIAGNOSIS — K219 Gastro-esophageal reflux disease without esophagitis: Secondary | ICD-10-CM

## 2019-01-21 DIAGNOSIS — Z950 Presence of cardiac pacemaker: Secondary | ICD-10-CM

## 2019-01-21 DIAGNOSIS — I5022 Chronic systolic (congestive) heart failure: Secondary | ICD-10-CM

## 2019-01-21 NOTE — Progress Notes (Signed)
[facility]:   [facility] Room Number: [place] of Service:  ALF (13)   CODE STATUS: DNR  Allergies  Allergen Reactions  . Cymbalta [Duloxetine Hcl] Other (See Comments)    Urinary retention, constipation and insomnia  . Aspirin     Heart operation was told to never take Aspirin    Chief Complaint  Patient presents with  . Medical Management of Chronic Issues    Chronic systolic heart failure; gerd without esophagitis; chronic idiopathic constipation      HPI:  He is a 85 year old long term resident of assisted living being seen for the management of his chronic illnesses: systolic heart failure; afib; gerd; constipation. He does complain that he gets winded more easily; as he ambulates about 50 feet and gets tired. He denies any palpitations; no chest pain; no shortness of breath.   Past Medical History:  Diagnosis Date  . Atrial fibrillation ([place])   . Cancer ([place])    melanoma- right ear  . Cardiomyopathy, secondary ([place])   . Chronic headaches started age 26  . Congestive heart failure (CHF) ([place])   . Coronary artery disease involving autologous vein coronary bypass graft with angina pectoris ([place]) 10/18/2018  . Coronary atherosclerosis of native coronary artery   . DJD (degenerative joint disease)   . Essential hypertension, benign   . GERD (gastroesophageal reflux disease)   . Glaucoma   . History of mitral valve replacement with mechanical valve   . Hyperlipidemia, unspecified   . Hypertension   . Lumbago   . Osteoporosis    secondary to low testoerone  . S/P MVR (mitral valve replacement)   . Sciatica   . Scoliosis     Past Surgical History:  Procedure Laterality Date  . CARDIOVERSION  2013  . CATARACT EXTRACTION, BILATERAL     x 2  . CORONARY ARTERY BYPASS GRAFT  12/2014   coronary artery bypass w/arterial grafts   . HERNIA REPAIR     x 2  . MELANOMA EXCISION     right ear  . MITRAL VALVE REPLACEMENT  1994   [facility]  .  NASAL SEPTUM SURGERY  1963  . PERMANENT PACEMAKER INSERTION Left 12/22/2014    Social History   Socioeconomic History  . Marital status: Widowed    Spouse name: Not on file  . Number of children: 0  . Years of education: college degree  . Highest education level: Not on file  Occupational History  . Not on file  Social Needs  . Financial resource strain: Not on file  . Food insecurity:    Worry: Not on file    Inability: Not on file  . Transportation needs:    Medical: Not on file    Non-medical: Not on file  Tobacco Use  . Smoking status: Never Smoker  . Smokeless tobacco: Never Used  Substance and Sexual Activity  . Alcohol use: No  . Drug use: No  . Sexual activity: Not on file  Lifestyle  . Physical activity:    Days per week: Not on file    Minutes per session: Not on file  . Stress: Not on file  Relationships  . Social connections:    Talks on phone: Not on file    Gets together: Not on file    Attends religious service: Not on file    Active member of club or organization: Not on file    Attends meetings of clubs  or organizations: Not on file    Relationship status: Not on file  . Intimate partner violence:    Fear of current or ex partner: Not on file    Emotionally abused: Not on file    Physically abused: Not on file    Forced sexual activity: Not on file  Other Topics Concern  . Not on file  Social History Narrative   Exercise; light 3 x week   Lives alone, widowed at [place] History  Problem Relation Age of Onset  . Cancer Father        unknown  . Alzheimer's disease Brother   . Arrhythmia Brother   . Mental illness Mother       VITAL SIGNS BP (!) 159/59   Pulse 61   Temp 98.1 F (36.7 C)   Resp 18   Ht 5\' 7"  (1.702 m)   Wt 134 lb 14.4 oz (61.2 kg)   SpO2 96%   BMI 21.13 kg/m   Outpatient Encounter Medications as of 01/21/2019  Medication Sig  . acetaminophen (TYLENOL) 325 MG tablet Take 650 mg by mouth every 4  (four) hours as needed for fever. Maximum dose for 24 hours is 3000 mg from all sources of Acetaminophen/tylenol  . brimonidine (ALPHAGAN) 0.15 % ophthalmic solution Place 1 drop into both eyes 2 (two) times daily.   . Cholecalciferol (VITAMIN D) 50 MCG (2000 UT) tablet Take 2,000 Units by mouth daily.  . Coenzyme Q10 10 MG capsule Take 100 mg by mouth 4 (four) times daily.  Marland Kitchen docusate sodium (COLACE) 100 MG capsule Take 2 capsules (200 mg total) by mouth at bedtime.  . ferrous sulfate 325 (65 FE) MG tablet Take 1 tablet by mouth every other day.   . furosemide (LASIX) 20 MG tablet Take 1 tablet (20 mg total) by mouth daily.  Marland Kitchen latanoprost (XALATAN) 0.005 % ophthalmic solution Place 1 drop into both eyes at bedtime.   Marland Kitchen losartan (COZAAR) 100 MG tablet TAKE 1 TABLET BY MOUTH DAILY  . meclizine (ANTIVERT) 25 MG tablet Take 25 mg by mouth 3 (three) times daily as needed for dizziness.  . Melatonin 3 MG TABS Take 1 tablet by mouth at bedtime.   . metoprolol tartrate (LOPRESSOR) 25 MG tablet Take 25 mg by mouth 2 (two) times daily. Hold for BP less than 100/'s or pulse less than 60  . niacinamide 500 MG tablet Take 500 mg by mouth daily.  . NON FORMULARY Diet Type: regular  . Nutritional Supplements (ENSURE ENLIVE PO) Take 1 Bottle by mouth 2 (two) times daily between meals.   Marland Kitchen omeprazole (PRILOSEC) 40 MG capsule TAKE 1 CAPSULE(40 MG) BY MOUTH DAILY  . ondansetron (ZOFRAN-ODT) 4 MG disintegrating tablet Take 4 mg by mouth every 4 (four) hours as needed for nausea or vomiting.  . polyethylene glycol (MIRALAX / GLYCOLAX) packet Take 17 g by mouth daily.  . tamsulosin (FLOMAX) 0.4 MG CAPS capsule TAKE 1 CAPSULE BY MOUTH EVERY DAY  . warfarin (COUMADIN) 4 MG tablet Take 4 mg by mouth at bedtime.   . [DISCONTINUED] amitriptyline (ELAVIL) 25 MG tablet Take 1 tablet (25 mg total) by mouth at bedtime.   No facility-administered encounter medications on file as of 01/21/2019.      SIGNIFICANT DIAGNOSTIC  EXAMS  LABS REVIEWED: PREVIOUS   06-19-18: wbc 4.3; hgb 10.9; hct 32.0; mcv 103.0; plt 205  10-17-18: wbc 5.2; hgb 10.4; hct 31.3; mcv  103.3 plt 196; glucose 126; bun 19; creat 0.94; k+ 4.0; na++ 132; ca 9.1; liver normal albumin 4.0 INR 1.46 10-23-18 INR: 1.39  10-31-18: INR 3.22   11-10-18: inr 3.34: on 4 mg coumadin   12-11-18: INR 3.14 on coumadin 4 mg 01-12-19: INR 2.92; coumadin 4 mg   NO NEW LABS.    Review of Systems  Constitutional: Positive for malaise/fatigue.  Respiratory: Negative for cough and shortness of breath.   Cardiovascular: Negative for chest pain, palpitations and leg swelling.  Gastrointestinal: Negative for abdominal pain, constipation and heartburn.  Musculoskeletal: Negative for back pain, joint pain and myalgias.  Skin: Negative.   Neurological: Negative for dizziness.  Psychiatric/Behavioral: The patient is not nervous/anxious.     Physical Exam Constitutional:      General: He is not in acute distress.    Appearance: He is well-developed. He is not diaphoretic.     Comments: Thin   Neck:     Musculoskeletal: Neck supple.     Thyroid: No thyromegaly.  Cardiovascular:     Rate and Rhythm: Normal rate and regular rhythm.     Pulses: Normal pulses.     Heart sounds: Murmur present.     Comments: Pacemaker Status post mitral valve replacement  2/6 S3 click     Pulmonary:     Effort: Pulmonary effort is normal. No respiratory distress.     Breath sounds: Normal breath sounds.  Abdominal:     General: Bowel sounds are normal. There is no distension.     Palpations: Abdomen is soft.     Tenderness: There is no abdominal tenderness.  Musculoskeletal:     Right lower leg: No edema.     Left lower leg: No edema.     Comments: Is able to move all extremities scoliosis     Lymphadenopathy:     Cervical: No cervical adenopathy.  Skin:    General: Skin is warm and dry.  Neurological:     Mental Status: He is alert and oriented to person, place, and  time.  Psychiatric:        Mood and Affect: Mood normal.     ASSESSMENT/ PLAN:  TODAY:   1. GERD without esophagitis: is stable will continue prilosec 40 mg twice daily  2. Chronic idiopathic constipation: is stable will continue colace 200 mg nightly and miralax daily   3. Permanent atrial fibrillation status post pacemaker and mitral valve replacement: heart rate is stable: will continue lopressor 25 mg twice daily for rate control is on long term coumadin therapy   4. Chronic systolic heart failure: EF 35-40% (04-30-13): is stable will continue lasix 20 mg daily   PREVIOUS  5. Hypertensive heart disease with chronic systolic heart failure: is stable b/p 159/59: will continue cozaar 100 mg daily lopressor 25 mg twice daily   6. Benign prostatic hyperplasia with urinary hesitancy: is stable will continue flomax 0.4 mg every other day   7. Iron deficiency anemia due to chronic blood loss: stable hgb 10.4: will continue iron every other day   8. Chronic angle-closure glaucoma, bilateral: is stable will continue alphagan both eyes twice daily and xalatan both eyes nightly   9. Major depressive disorder, recurrent episode, mild: is stable is currently not on medications; will continue melatonin 3 mg nightly   10. Coronary artery disease involving autologous vein coronary bypass graft with angina pectoris: is stable no complaints of chest pain; is status post cabg and mitral valve replacement is on  long term coumadin therapy   11. Long term current use of anticoagulants with INR goal of 2.5-3.5: is stable his INR is 2.92; will continue coumadin 4 mg daily and will check INR on 02-09-19    Will give pneumovax Will have PT evaluate and treat as indicated for endurance and strengthening.    MD is aware of resident's narcotic use and is in agreement with current plan of care. We will attempt to wean resident as apropriate   Ok Edwards NP [facility]  Contact  [phone] Monday through Friday 8am- 5pm  After hours call [phone]

## 2019-01-21 NOTE — Progress Notes (Signed)
Entered in error

## 2019-01-22 ENCOUNTER — Telehealth: Payer: Self-pay

## 2019-01-22 NOTE — Telephone Encounter (Signed)
Copied from [place] [id number]. Topic: General - Inquiry >> Jan 22, 2019 10:23 AM Judyann Munson wrote: Reason for CRM: [facility] calling to state they are needing a copy of the patient vaccination  with attention to [place].

## 2019-01-22 NOTE — Telephone Encounter (Signed)
Copied from [place] [id number]. Topic: General - Inquiry >> Jan 22, 2019 10:23 AM Judyann Munson wrote: Reason for CRM: [facility] calling to state they are needing a copy of the patient vaccination  with attention to [place].  **I have printed & faxed.**

## 2019-01-24 DIAGNOSIS — Z950 Presence of cardiac pacemaker: Secondary | ICD-10-CM | POA: Insufficient documentation

## 2019-02-09 ENCOUNTER — Other Ambulatory Visit
Admission: RE | Admit: 2019-02-09 | Discharge: 2019-02-09 | Disposition: A | Discharge status: Home / Self Care | Payer: Medicare PPO | Source: Skilled Nursing Facility | Attending: Adult Health | Admitting: Adult Health

## 2019-02-09 DIAGNOSIS — Z7901 Long term (current) use of anticoagulants: Secondary | ICD-10-CM | POA: Insufficient documentation

## 2019-02-09 LAB — PROTIME-INR
INR: 2.7 — ABNORMAL HIGH (ref 0.8–1.2)
PROTHROMBIN TIME: 28.2 s — AB (ref 11.4–15.2)

## 2019-02-11 ENCOUNTER — Encounter
Admission: RE | Admit: 2019-02-11 | Discharge: 2019-02-11 | Disposition: A | Discharge status: Home / Self Care | Payer: No Typology Code available for payment source | Source: Ambulatory Visit | Attending: Internal Medicine | Admitting: Internal Medicine

## 2019-02-24 ENCOUNTER — Other Ambulatory Visit
Admission: RE | Admit: 2019-02-24 | Discharge: 2019-02-24 | Disposition: A | Discharge status: Home / Self Care | Payer: Medicare PPO | Source: Ambulatory Visit | Attending: Adult Health | Admitting: Adult Health

## 2019-02-24 DIAGNOSIS — Z7901 Long term (current) use of anticoagulants: Secondary | ICD-10-CM | POA: Insufficient documentation

## 2019-02-24 LAB — PROTIME-INR
INR: 3.2 — AB (ref 0.8–1.2)
Prothrombin Time: 32 seconds — ABNORMAL HIGH (ref 11.4–15.2)

## 2019-02-25 ENCOUNTER — Non-Acute Institutional Stay: Payer: Medicare PPO | Admitting: Adult Health

## 2019-02-25 ENCOUNTER — Encounter: Payer: Self-pay | Admitting: Adult Health

## 2019-02-25 DIAGNOSIS — D500 Iron deficiency anemia secondary to blood loss (chronic): Secondary | ICD-10-CM

## 2019-02-25 DIAGNOSIS — I4821 Permanent atrial fibrillation: Secondary | ICD-10-CM

## 2019-02-25 DIAGNOSIS — Z7901 Long term (current) use of anticoagulants: Secondary | ICD-10-CM | POA: Diagnosis not present

## 2019-02-25 NOTE — Progress Notes (Signed)
[facility]  [facility] Room Number: [place] of Service:  ALF [phone]) Provider:  Durenda Age, NP  Patient Care Team: Kirk Ruths, MD as PCP - General (Internal Medicine) Gerlene Fee, NP as Nurse Practitioner (Geriatric Medicine)  Extended Emergency Contact Information Primary Emergency Contact: Boyar,Willard G Address: [address]          [place], [place] [address] [place] Phone: [phone] Relation: Brother Secondary Emergency Contact: [place] Mobile Phone: [phone] Relation: Niece  Code Status:  DNR  Goals of care: Advanced Directive information Advanced Directives 01/21/2019  Does Patient Have a Medical Advance Directive? Yes  Type of Advance Directive Out of facility DNR (pink MOST or yellow form)  Does patient want to make changes to medical advance directive? No - Patient declined  Copy of [place] in Chart? -  Would patient like information on creating a medical advance directive? -  Pre-existing out of facility DNR order (yellow form or pink MOST form) Yellow form placed in chart (order not valid for inpatient use)     Chief Complaint  Patient presents with  . Acute Visit    Coumadin therapy    HPI:  Pt is a 85 y.o. male seen today for an acute visit for Coumadin therapy.  He is a long-term ALF resident of [facility]. He has a PMH of systolic heart failure, atrial fibrillation, cardiomyopathy, and GERD. Latest INR 3.2, supratherapeutic. No bruising has been noted. He is on chronic anticoagulation for atrial fibrillation. He takes FeSO4 325 mg daily for anemia.   Past Medical History:  Diagnosis Date  . Atrial fibrillation ([place])   . Cancer ([place])    melanoma- right ear  . Cardiomyopathy, secondary ([place])   . Chronic headaches started age 40  . Congestive heart failure (CHF) ([place])   . Coronary artery disease involving autologous vein coronary bypass graft with angina  pectoris ([place]) 10/18/2018  . Coronary atherosclerosis of native coronary artery   . DJD (degenerative joint disease)   . Essential hypertension, benign   . GERD (gastroesophageal reflux disease)   . Glaucoma   . History of mitral valve replacement with mechanical valve   . Hyperlipidemia, unspecified   . Hypertension   . Lumbago   . Osteoporosis    secondary to low testoerone  . S/P MVR (mitral valve replacement)   . Sciatica   . Scoliosis    Past Surgical History:  Procedure Laterality Date  . CARDIOVERSION  2013  . CATARACT EXTRACTION, BILATERAL     x 2  . CORONARY ARTERY BYPASS GRAFT  12/2014   coronary artery bypass w/arterial grafts   . HERNIA REPAIR     x 2  . MELANOMA EXCISION     right ear  . MITRAL VALVE REPLACEMENT  1994   [facility]  . NASAL SEPTUM SURGERY  1963  . PERMANENT PACEMAKER INSERTION Left 12/22/2014    Allergies  Allergen Reactions  . Cymbalta [Duloxetine Hcl] Other (See Comments)    Urinary retention, constipation and insomnia  . Aspirin     Heart operation was told to never take Aspirin    Outpatient Encounter Medications as of 02/25/2019  Medication Sig  . acetaminophen (TYLENOL) 325 MG tablet Take 650 mg by mouth every 4 (four) hours as needed for fever. Maximum dose for 24 hours is 3000 mg from all sources of Acetaminophen/tylenol  . brimonidine (ALPHAGAN) 0.15 % ophthalmic solution Place 1 drop  into both eyes 2 (two) times daily.   . Cholecalciferol (VITAMIN D) 50 MCG (2000 UT) tablet Take 2,000 Units by mouth daily.  . Coenzyme Q10 10 MG capsule Take 100 mg by mouth 4 (four) times daily.  Marland Kitchen docusate sodium (COLACE) 100 MG capsule Take 2 capsules (200 mg total) by mouth at bedtime.  . ferrous sulfate 325 (65 FE) MG tablet Take 1 tablet by mouth every other day.   . furosemide (LASIX) 20 MG tablet Take 1 tablet (20 mg total) by mouth daily.  . hydrocortisone cream 0.5 % Apply 1 application topically 2 (two) times daily. Apply to left ankle for  rash  . latanoprost (XALATAN) 0.005 % ophthalmic solution Place 1 drop into both eyes at bedtime.   Marland Kitchen loperamide (IMODIUM) 2 MG capsule Take 2-4 mg by mouth as needed for diarrhea or loose stools. Take 2 tablets to = 4 mg for initial loose stool, take 1 tablet to = 2 mg for each subsequent loose stool for up to 8 doses/24 hours  . losartan (COZAAR) 100 MG tablet TAKE 1 TABLET BY MOUTH DAILY  . meclizine (ANTIVERT) 25 MG tablet Take 25 mg by mouth 3 (three) times daily as needed for dizziness.  . Melatonin 3 MG TABS Take 1 tablet by mouth at bedtime.   . metoprolol tartrate (LOPRESSOR) 25 MG tablet Take 25 mg by mouth 2 (two) times daily. Hold for BP less than 100/'s or pulse less than 60  . niacinamide 500 MG tablet Take 500 mg by mouth daily.  . NON FORMULARY Diet Type: regular  . Nutritional Supplements (ENSURE ENLIVE PO) Take 1 Bottle by mouth 2 (two) times daily between meals.   Marland Kitchen omeprazole (PRILOSEC) 40 MG capsule TAKE 1 CAPSULE(40 MG) BY MOUTH DAILY  . ondansetron (ZOFRAN-ODT) 4 MG disintegrating tablet Take 4 mg by mouth every 4 (four) hours as needed for nausea or vomiting.  . polyethylene glycol (MIRALAX / GLYCOLAX) packet Take 17 g by mouth daily.  . tamsulosin (FLOMAX) 0.4 MG CAPS capsule TAKE 1 CAPSULE BY MOUTH EVERY DAY  . [DISCONTINUED] amitriptyline (ELAVIL) 25 MG tablet Take 1 tablet (25 mg total) by mouth at bedtime.  . [DISCONTINUED] warfarin (COUMADIN) 4 MG tablet Take 4 mg by mouth at bedtime.    No facility-administered encounter medications on file as of 02/25/2019.     Review of Systems  GENERAL: No change in appetite, no fatigue, no weight changes, no fever, chills or weakness MOUTH and THROAT: Denies oral discomfort, gingival pain or bleeding RESPIRATORY: no cough, SOB, DOE, wheezing, hemoptysis CARDIAC: No chest pain, edema or palpitations GI: No abdominal pain, diarrhea, constipation, heart burn, nausea or vomiting GU: Denies dysuria, frequency, hematuria,  incontinence, or discharge NEUROLOGICAL: Denies dizziness, syncope, numbness, or headache PSYCHIATRIC: Denies feelings of depression or anxiety. No report of hallucinations, insomnia, paranoia, or agitation   Immunization History  Administered Date(s) Administered  . Influenza Split 09/17/2013, 09/23/2014  . Influenza, High Dose Seasonal PF 09/03/2017, 09/29/2018  . Influenza-Unspecified 09/09/2016, 08/28/2017  . Pneumococcal Polysaccharide-23 06/25/2004, 01/22/2019  . Tdap 10/14/2014  . Zoster 06/26/2011   Pertinent  Health Maintenance Due  Topic Date Due  . PNA vac Low Risk Adult (2 of 2 - PCV13) 01/23/2020  . INFLUENZA VACCINE  Completed   Fall Risk  07/09/2018 01/21/2017 01/22/2015 02/15/2014  Falls in the past year? Yes No No No  Number falls in past yr: 1 - - -  Injury with Fall? Yes - - -  Risk Factor Category  High Fall Risk - - -     Vitals:   02/25/19 1129  BP: (!) 127/57  Pulse: (!) 59  Resp: 16  Temp: (!) 97.1 F (36.2 C)  TempSrc: Oral  SpO2: 96%  Height: 5\' 7"  (1.702 m)   Body mass index is 21.13 kg/m.  Physical Exam  GENERAL APPEARANCE: Well nourished. In no acute distress. Normal body habitus SKIN:  Skin is warm and dry.  MOUTH and THROAT: Lips are without lesions. Oral mucosa is moist and without lesions. RESPIRATORY: Breathing is even & unlabored, BS CTAB CARDIAC: RRR, no murmur,no extra heart sounds, no edema, Left chest pacemaker GI: Abdomen soft, normal BS, no masses, no tenderness EXTREMITIES:  Able to move X 4 extremities NEUROLOGICAL: There is no tremor. Speech is clear.  Alert and oriented X 3. PSYCHIATRIC: Affect and behavior are appropriate   Labs reviewed: Recent Labs    05/26/18 0600 06/05/18 1130 10/17/18 1738  NA 138 135 132*  K 3.7 4.3 4.0  CL 105 102 99  CO2 25 25 27   GLUCOSE 101* 103* 126*  BUN 13 25* 19  CREATININE 0.87 0.77 0.94  CALCIUM 8.5* 9.0 9.1   Recent Labs    05/23/18 1313 05/26/18 0600 10/17/18 1738  AST  51* 45* 37  ALT 56 53 28  ALKPHOS 51 47 52  BILITOT 0.5 0.6 0.9  PROT 5.9* 5.4* 6.2*  ALBUMIN 3.6 3.3* 4.0   Recent Labs    06/05/18 1130 06/19/18 1238 10/17/18 1738  WBC 4.5 4.3 5.2  NEUTROABS 3.0 2.7 3.5  HGB 9.3* 10.9* 10.4*  HCT 27.7* 32.0* 31.3*  MCV 106.5* 103.0* 103.3*  PLT 214 205.0 196   Lab Results  Component Value Date   TSH 3.14 03/06/2018   No results found for: HGBA1C Lab Results  Component Value Date   CHOL 187 03/06/2018   HDL 66.40 03/06/2018   LDLCALC 104 (H) 03/06/2018   LDLDIRECT 105.9 03/13/2012   TRIG 81.0 03/06/2018   CHOLHDL 3 03/06/2018    Assessment/Plan  1. Long term current use of anticoagulant Lab Results  Component Value Date   INR 3.2 (H) 02/24/2019   INR 2.7 (H) 02/09/2019   INR 2.92 01/12/2019   PROTIME 31.2 (A) 04/17/2016   PROTIME 33.7 (A) 08/03/2015   PROTIME 34.4 (A) 07/06/2015  - will hold Coumadin, recheck INR on 02/26/19   2. Permanent atrial fibrillation - rate-controlled, continue metoprolol tartrate 25 mg 1 tab twice a day  3. Iron deficiency anemia due to chronic blood loss Lab Results  Component Value Date   HGB 10.4 (L) 10/17/2018  -Continue ferrous sulfate 325 mg 1 tab daily, recheck CBC    Family/ staff Communication: Discussed plan of care with resident.  Labs/tests ordered:  INR and CBC  Goals of care:   Long-term care.   Durenda Age, NP [facility] [phone] (Monday-Friday 8:00 a.m. - 5:00 p.m.) [phone] (after hours)

## 2019-02-26 ENCOUNTER — Other Ambulatory Visit
Admission: RE | Admit: 2019-02-26 | Discharge: 2019-02-26 | Disposition: A | Discharge status: Home / Self Care | Payer: Medicare PPO | Source: Skilled Nursing Facility | Attending: Adult Health | Admitting: Adult Health

## 2019-02-26 DIAGNOSIS — Z7901 Long term (current) use of anticoagulants: Secondary | ICD-10-CM | POA: Diagnosis present

## 2019-02-26 LAB — CBC WITH DIFFERENTIAL/PLATELET
Abs Immature Granulocytes: 0.01 10*3/uL (ref 0.00–0.07)
Basophils Absolute: 0 10*3/uL (ref 0.0–0.1)
Basophils Relative: 0 %
Eosinophils Absolute: 0.3 10*3/uL (ref 0.0–0.5)
Eosinophils Relative: 5 %
HCT: 32.2 % — ABNORMAL LOW (ref 39.0–52.0)
HEMOGLOBIN: 10.6 g/dL — AB (ref 13.0–17.0)
Immature Granulocytes: 0 %
LYMPHS PCT: 16 %
Lymphs Abs: 0.7 10*3/uL (ref 0.7–4.0)
MCH: 33.7 pg (ref 26.0–34.0)
MCHC: 32.9 g/dL (ref 30.0–36.0)
MCV: 102.2 fL — ABNORMAL HIGH (ref 80.0–100.0)
Monocytes Absolute: 0.6 10*3/uL (ref 0.1–1.0)
Monocytes Relative: 13 %
NEUTROS ABS: 3 10*3/uL (ref 1.7–7.7)
Neutrophils Relative %: 66 %
Platelets: 159 10*3/uL (ref 150–400)
RBC: 3.15 MIL/uL — ABNORMAL LOW (ref 4.22–5.81)
RDW: 13.8 % (ref 11.5–15.5)
WBC: 4.6 10*3/uL (ref 4.0–10.5)
nRBC: 0 % (ref 0.0–0.2)

## 2019-02-26 LAB — PROTIME-INR
INR: 3 — ABNORMAL HIGH (ref 0.8–1.2)
Prothrombin Time: 30.7 seconds — ABNORMAL HIGH (ref 11.4–15.2)

## 2019-02-27 ENCOUNTER — Non-Acute Institutional Stay: Payer: Medicare PPO | Admitting: Adult Health

## 2019-02-27 ENCOUNTER — Encounter: Payer: Self-pay | Admitting: Adult Health

## 2019-02-27 DIAGNOSIS — I4821 Permanent atrial fibrillation: Secondary | ICD-10-CM | POA: Diagnosis not present

## 2019-02-27 DIAGNOSIS — Z7901 Long term (current) use of anticoagulants: Secondary | ICD-10-CM

## 2019-02-27 NOTE — Progress Notes (Signed)
[facility] Room Number: 235-P Place of Service:  ALF [phone]) Provider:  Durenda Age, NP  Patient Care Team: Kirk Ruths, MD as PCP - General (Internal Medicine) Gerlene Fee, NP as Nurse Practitioner (Geriatric Medicine)  Extended Emergency Contact Information Primary Emergency Contact: Sabas,Willard G Address: [address]          [place], [place] [address] [place] Phone: [phone] Relation: Brother Secondary Emergency Contact: [place] Mobile Phone: [phone] Relation: Niece  Code Status:  DNR  Goals of care: Advanced Directive information Advanced Directives 02/27/2019  Does Patient Have a Medical Advance Directive? Yes  Type of Advance Directive Out of facility DNR (pink MOST or yellow form)  Does patient want to make changes to medical advance directive? No - Patient declined  Copy of [place] in Chart? -  Would patient like information on creating a medical advance directive? -  Pre-existing out of facility DNR order (yellow form or pink MOST form) -     Chief Complaint  Patient presents with  . Acute Visit    Coumadin therapy    HPI:  Pt is a 85 y.o. male seen today for Coumadin therapy. Coumadin was held on 3/17 due to supratherapeutic INR 3.2. Latest INR 3.0, therapeutic. No bruising has been noted. He is on chronic anticoagulation due to atrial fibrillation.   Past Medical History:  Diagnosis Date  . Atrial fibrillation ([place])   . Cancer ([place])    melanoma- right ear  . Cardiomyopathy, secondary ([place])   . Chronic headaches started age 68  . Congestive heart failure (CHF) ([place])   . Coronary artery disease involving autologous vein coronary bypass graft with angina pectoris ([place]) 10/18/2018  . Coronary atherosclerosis of native coronary artery   . DJD (degenerative joint disease)   . Essential hypertension, benign   . GERD (gastroesophageal reflux  disease)   . Glaucoma   . History of mitral valve replacement with mechanical valve   . Hyperlipidemia, unspecified   . Hypertension   . Lumbago   . Osteoporosis    secondary to low testoerone  . S/P MVR (mitral valve replacement)   . Sciatica   . Scoliosis    Past Surgical History:  Procedure Laterality Date  . CARDIOVERSION  2013  . CATARACT EXTRACTION, BILATERAL     x 2  . CORONARY ARTERY BYPASS GRAFT  12/2014   coronary artery bypass w/arterial grafts   . HERNIA REPAIR     x 2  . MELANOMA EXCISION     right ear  . MITRAL VALVE REPLACEMENT  1994   [facility]  . NASAL SEPTUM SURGERY  1963  . PERMANENT PACEMAKER INSERTION Left 12/22/2014    Allergies  Allergen Reactions  . Cymbalta [Duloxetine Hcl] Other (See Comments)    Urinary retention, constipation and insomnia  . Aspirin     Heart operation was told to never take Aspirin    Outpatient Encounter Medications as of 02/27/2019  Medication Sig  . acetaminophen (TYLENOL) 325 MG tablet Take 650 mg by mouth every 4 (four) hours as needed for fever. Maximum dose for 24 hours is 3000 mg from all sources of Acetaminophen/tylenol  . brimonidine (ALPHAGAN) 0.15 % ophthalmic solution Place 1 drop into both eyes 2 (two) times daily.   . Cholecalciferol (VITAMIN D) 50 MCG (2000 UT) tablet Take 2,000 Units by mouth daily.  . Coenzyme Q10 10 MG capsule Take 100  mg by mouth 4 (four) times daily.  Marland Kitchen docusate sodium (COLACE) 100 MG capsule Take 2 capsules (200 mg total) by mouth at bedtime.  . ferrous sulfate 325 (65 FE) MG tablet Take 1 tablet by mouth every other day.   . furosemide (LASIX) 20 MG tablet Take 1 tablet (20 mg total) by mouth daily.  . hydrocortisone cream 0.5 % Apply 1 application topically 2 (two) times daily. Apply to left ankle for rash  . latanoprost (XALATAN) 0.005 % ophthalmic solution Place 1 drop into both eyes at bedtime.   Marland Kitchen loperamide (IMODIUM) 2 MG capsule Take 2-4 mg by mouth as needed for diarrhea or  loose stools. Take 2 tablets to = 4 mg for initial loose stool, take 1 tablet to = 2 mg for each subsequent loose stool for up to 8 doses/24 hours  . losartan (COZAAR) 100 MG tablet TAKE 1 TABLET BY MOUTH DAILY  . meclizine (ANTIVERT) 25 MG tablet Take 25 mg by mouth 3 (three) times daily as needed for dizziness.  . Melatonin 3 MG TABS Take 1 tablet by mouth at bedtime.   . metoprolol tartrate (LOPRESSOR) 25 MG tablet Take 25 mg by mouth 2 (two) times daily. Hold for BP less than 100/'s or pulse less than 60  . niacinamide 500 MG tablet Take 500 mg by mouth daily.  . NON FORMULARY Diet Type: regular  . omeprazole (PRILOSEC) 40 MG capsule Take 40 mg by mouth 2 (two) times daily.  . ondansetron (ZOFRAN-ODT) 4 MG disintegrating tablet Take 4 mg by mouth every 4 (four) hours as needed for nausea or vomiting.  . polyethylene glycol (MIRALAX / GLYCOLAX) packet Take 17 g by mouth daily.  . tamsulosin (FLOMAX) 0.4 MG CAPS capsule TAKE 1 CAPSULE BY MOUTH EVERY DAY  . warfarin (COUMADIN) 3 MG tablet Take 3 mg by mouth daily.  . [DISCONTINUED] amitriptyline (ELAVIL) 25 MG tablet Take 1 tablet (25 mg total) by mouth at bedtime.  . [DISCONTINUED] Nutritional Supplements (ENSURE ENLIVE PO) Take 1 Bottle by mouth 2 (two) times daily between meals.   . [DISCONTINUED] omeprazole (PRILOSEC) 40 MG capsule TAKE 1 CAPSULE(40 MG) BY MOUTH DAILY   No facility-administered encounter medications on file as of 02/27/2019.     Review of Systems  GENERAL: No change in appetite, no fatigue, no weight changes, no fever, chills or weakness MOUTH and THROAT: Denies oral discomfort, gingival pain or bleeding, pain from teeth or hoarseness   RESPIRATORY: no cough, SOB, DOE, wheezing, hemoptysis CARDIAC: No chest pain, edema or palpitations GI: No abdominal pain, diarrhea, constipation, heart burn, nausea or vomiting GU: Denies dysuria, frequency, hematuria, incontinence, or discharge NEUROLOGICAL: Denies dizziness, syncope,  numbness, or headache PSYCHIATRIC: Denies feelings of depression or anxiety. No report of hallucinations, insomnia, paranoia, or agitation   Immunization History  Administered Date(s) Administered  . Influenza Split 09/17/2013, 09/23/2014  . Influenza, High Dose Seasonal PF 09/03/2017, 09/29/2018  . Influenza-Unspecified 09/09/2016, 08/28/2017  . Pneumococcal Polysaccharide-23 06/25/2004, 01/22/2019  . Tdap 10/14/2014  . Zoster 06/26/2011   Pertinent  Health Maintenance Due  Topic Date Due  . PNA vac Low Risk Adult (2 of 2 - PCV13) 01/23/2020  . INFLUENZA VACCINE  Completed   Fall Risk  07/09/2018 01/21/2017 01/22/2015 02/15/2014  Falls in the past year? Yes No No No  Number falls in past yr: 1 - - -  Injury with Fall? Yes - - -  Risk Factor Category  High Fall Risk - - -  Vitals:   02/27/19 1117  BP: 122/62  Pulse: 62  Resp: 16  Temp: (!) 97.1 F (36.2 C)  TempSrc: Oral  SpO2: 96%  Weight: 137 lb 14.4 oz (62.6 kg)  Height: 5\' 7"  (1.702 m)   Body mass index is 21.6 kg/m.  Physical Exam  GENERAL APPEARANCE: Well nourished. In no acute distress. Normal body habitus SKIN:  Skin is warm and dry.  MOUTH and THROAT: Lips are without lesions. Oral mucosa is moist and without lesions. RESPIRATORY: Breathing is even & unlabored, BS CTAB CARDIAC: RRR, no murmur,no extra heart sounds, no edema GI: Abdomen soft, normal BS, no masses, no tenderness EXTREMITIES:  Able to move X 4 extremities NEUROLOGICAL: There is no tremor. Speech is clear.  Alert and oriented X 3. PSYCHIATRIC: Affect and behavior are appropriate   Labs reviewed: Recent Labs    05/26/18 0600 06/05/18 1130 10/17/18 1738  NA 138 135 132*  K 3.7 4.3 4.0  CL 105 102 99  CO2 25 25 27   GLUCOSE 101* 103* 126*  BUN 13 25* 19  CREATININE 0.87 0.77 0.94  CALCIUM 8.5* 9.0 9.1   Recent Labs    05/23/18 1313 05/26/18 0600 10/17/18 1738  AST 51* 45* 37  ALT 56 53 28  ALKPHOS 51 47 52  BILITOT 0.5 0.6  0.9  PROT 5.9* 5.4* 6.2*  ALBUMIN 3.6 3.3* 4.0   Recent Labs    06/19/18 1238 10/17/18 1738 02/26/19 1140  WBC 4.3 5.2 4.6  NEUTROABS 2.7 3.5 3.0  HGB 10.9* 10.4* 10.6*  HCT 32.0* 31.3* 32.2*  MCV 103.0* 103.3* 102.2*  PLT 205.0 196 159   Lab Results  Component Value Date   TSH 3.14 03/06/2018   No results found for: HGBA1C Lab Results  Component Value Date   CHOL 187 03/06/2018   HDL 66.40 03/06/2018   LDLCALC 104 (H) 03/06/2018   LDLDIRECT 105.9 03/13/2012   TRIG 81.0 03/06/2018   CHOLHDL 3 03/06/2018     Assessment/Plan  1. Long term current use of anticoagulant -INR  3.0. therapeutic, will decrease Coumadin from 4 mg to 3 mg daily and re-check INR on 03/05/19  2. Permanent atrial fibrillation - rate-controlled, continue metoprolol tartrate 25 mg 1 tab twice a day   Family/ staff Communication: Discussed plan of care with resident.  Labs/tests ordered:  INR on 03/05/19  Goals of care:   Long-term care   Durenda Age, NP [facility] [phone] (Monday-Friday 8:00 a.m. - 5:00 p.m.) [phone] (after hours)

## 2019-02-27 NOTE — Progress Notes (Deleted)
[facility] Room Number: 235-P Place of Service:  ALF [phone]) Provider:  Durenda Age, NP  Patient Care Team: Kirk Ruths, MD as PCP - General (Internal Medicine) Gerlene Fee, NP as Nurse Practitioner (Geriatric Medicine)  Extended Emergency Contact Information Primary Emergency Contact: Ruesch,Willard G Address: [address]          [place], [place] [address] [place] Phone: [phone] Relation: Brother Secondary Emergency Contact: [place] Mobile Phone: [phone] Relation: Niece  Code Status:  DNR  Goals of care: Advanced Directive information Advanced Directives 02/25/2019  Does Patient Have a Medical Advance Directive? Yes  Type of Advance Directive Out of facility DNR (pink MOST or yellow form)  Does patient want to make changes to medical advance directive? No - Patient declined  Copy of [place] in Chart? -  Would patient like information on creating a medical advance directive? -  Pre-existing out of facility DNR order (yellow form or pink MOST form) -     Chief Complaint  Patient presents with  . Acute Visit    Coumadin therapy    HPI:  Pt is a 85 y.o. male seen today for an acute visit for Coumadin therapy. He is a long-term ALF resident of [place]. He has a PMH of systolic heart failure, atrial fibrillation, cardiomyopathy, and GERD.   Past Medical History:  Diagnosis Date  . Atrial fibrillation ([place])   . Cancer ([place])    melanoma- right ear  . Cardiomyopathy, secondary ([place])   . Chronic headaches started age 4  . Congestive heart failure (CHF) ([place])   . Coronary artery disease involving autologous vein coronary bypass graft with angina pectoris ([place]) 10/18/2018  . Coronary atherosclerosis of native coronary artery   . DJD (degenerative joint disease)   . Essential hypertension, benign   . GERD (gastroesophageal reflux disease)   . Glaucoma   . History  of mitral valve replacement with mechanical valve   . Hyperlipidemia, unspecified   . Hypertension   . Lumbago   . Osteoporosis    secondary to low testoerone  . S/P MVR (mitral valve replacement)   . Sciatica   . Scoliosis    Past Surgical History:  Procedure Laterality Date  . CARDIOVERSION  2013  . CATARACT EXTRACTION, BILATERAL     x 2  . CORONARY ARTERY BYPASS GRAFT  12/2014   coronary artery bypass w/arterial grafts   . HERNIA REPAIR     x 2  . MELANOMA EXCISION     right ear  . MITRAL VALVE REPLACEMENT  1994   [facility]  . NASAL SEPTUM SURGERY  1963  . PERMANENT PACEMAKER INSERTION Left 12/22/2014    Allergies  Allergen Reactions  . Cymbalta [Duloxetine Hcl] Other (See Comments)    Urinary retention, constipation and insomnia  . Aspirin     Heart operation was told to never take Aspirin    Outpatient Encounter Medications as of 02/27/2019  Medication Sig  . acetaminophen (TYLENOL) 325 MG tablet Take 650 mg by mouth every 4 (four) hours as needed for fever. Maximum dose for 24 hours is 3000 mg from all sources of Acetaminophen/tylenol  . brimonidine (ALPHAGAN) 0.15 % ophthalmic solution Place 1 drop into both eyes 2 (two) times daily.   . Cholecalciferol (VITAMIN D) 50 MCG (2000 UT) tablet Take 2,000 Units by mouth daily.  . Coenzyme Q10 10 MG capsule Take 100 mg by mouth  4 (four) times daily.  Marland Kitchen docusate sodium (COLACE) 100 MG capsule Take 2 capsules (200 mg total) by mouth at bedtime.  . ferrous sulfate 325 (65 FE) MG tablet Take 1 tablet by mouth every other day.   . furosemide (LASIX) 20 MG tablet Take 1 tablet (20 mg total) by mouth daily.  . hydrocortisone cream 0.5 % Apply 1 application topically 2 (two) times daily. Apply to left ankle for rash  . latanoprost (XALATAN) 0.005 % ophthalmic solution Place 1 drop into both eyes at bedtime.   Marland Kitchen loperamide (IMODIUM) 2 MG capsule Take 2-4 mg by mouth as needed for diarrhea or loose stools. Take 2 tablets to = 4 mg  for initial loose stool, take 1 tablet to = 2 mg for each subsequent loose stool for up to 8 doses/24 hours  . losartan (COZAAR) 100 MG tablet TAKE 1 TABLET BY MOUTH DAILY  . meclizine (ANTIVERT) 25 MG tablet Take 25 mg by mouth 3 (three) times daily as needed for dizziness.  . Melatonin 3 MG TABS Take 1 tablet by mouth at bedtime.   . metoprolol tartrate (LOPRESSOR) 25 MG tablet Take 25 mg by mouth 2 (two) times daily. Hold for BP less than 100/'s or pulse less than 60  . niacinamide 500 MG tablet Take 500 mg by mouth daily.  . NON FORMULARY Diet Type: regular  . omeprazole (PRILOSEC) 40 MG capsule Take 40 mg by mouth 2 (two) times daily.  . ondansetron (ZOFRAN-ODT) 4 MG disintegrating tablet Take 4 mg by mouth every 4 (four) hours as needed for nausea or vomiting.  . polyethylene glycol (MIRALAX / GLYCOLAX) packet Take 17 g by mouth daily.  . tamsulosin (FLOMAX) 0.4 MG CAPS capsule TAKE 1 CAPSULE BY MOUTH EVERY DAY  . warfarin (COUMADIN) 3 MG tablet Take 3 mg by mouth daily.  . [DISCONTINUED] amitriptyline (ELAVIL) 25 MG tablet Take 1 tablet (25 mg total) by mouth at bedtime.  . [DISCONTINUED] Nutritional Supplements (ENSURE ENLIVE PO) Take 1 Bottle by mouth 2 (two) times daily between meals.   . [DISCONTINUED] omeprazole (PRILOSEC) 40 MG capsule TAKE 1 CAPSULE(40 MG) BY MOUTH DAILY   No facility-administered encounter medications on file as of 02/27/2019.     Review of Systems  GENERAL: No change in appetite, no fatigue, no weight changes, no fever, chills or weakness SKIN: Denies rash, itching, wounds, ulcer sores, or nail abnormalities EYES: Denies change in vision, dry eyes, eye pain, itching or discharge EARS: Denies change in hearing, ringing in ears, or earache NOSE: Denies nasal congestion or epistaxis MOUTH and THROAT: Denies oral discomfort, gingival pain or bleeding, pain from teeth or hoarseness   RESPIRATORY: no cough, SOB, DOE, wheezing, hemoptysis CARDIAC: No chest pain,  edema or palpitations GI: No abdominal pain, diarrhea, constipation, heart burn, nausea or vomiting GU: Denies dysuria, frequency, hematuria, incontinence, or discharge MUSCULOSKELETAL: Denies joint pain, muscle pain, back pain, restricted movement, or unusual weakness CIRCULATION: Denies claudication, edema of legs, varicosities, or cold extremities NEUROLOGICAL: Denies dizziness, syncope, numbness, or headache PSYCHIATRIC: Denies feelings of depression or anxiety. No report of hallucinations, insomnia, paranoia, or agitation ENDOCRINE: Denies polyphagia, polyuria, polydipsia, heat or cold intolerance HEME/LYMPH: Denies excessive bruising, petechia, enlarged lymph nodes, or bleeding problems IMMUNOLOGIC: Denies history of frequent infections, AIDS, or use of immunosuppressive agents   Immunization History  Administered Date(s) Administered  . Influenza Split 09/17/2013, 09/23/2014  . Influenza, High Dose Seasonal PF 09/03/2017, 09/29/2018  . Influenza-Unspecified 09/09/2016, 08/28/2017  .  Pneumococcal Polysaccharide-23 06/25/2004, 01/22/2019  . Tdap 10/14/2014  . Zoster 06/26/2011   Pertinent  Health Maintenance Due  Topic Date Due  . PNA vac Low Risk Adult (2 of 2 - PCV13) 01/23/2020  . INFLUENZA VACCINE  Completed   Fall Risk  07/09/2018 01/21/2017 01/22/2015 02/15/2014  Falls in the past year? Yes No No No  Number falls in past yr: 1 - - -  Injury with Fall? Yes - - -  Risk Factor Category  High Fall Risk - - -     Vitals:   02/27/19 1117  BP: (!) 160/57  Pulse: 62  Resp: 16  Temp: (!) 97.1 F (36.2 C)  TempSrc: Oral  SpO2: 96%  Weight: 137 lb 14.4 oz (62.6 kg)  Height: 5\' 7"  (1.702 m)   Body mass index is 21.6 kg/m.  Physical Exam  GENERAL APPEARANCE: Well nourished. In no acute distress. Normal body habitus SKIN:  Skin is warm and dry. There are no suspicious lesions or rash HEAD: Normal in size and contour. No evidence of trauma EYES: Lids open and close  normally. No blepharitis, entropion or ectropion. PERRL. Conjunctivae are clear and sclerae are white. Lenses are without opacity EARS: Pinnae are normal. Patient hears normal voice tunes of the examiner MOUTH and THROAT: Lips are without lesions. Oral mucosa is moist and without lesions. Tongue is normal in shape, size, and color and without lesions NECK: supple, trachea midline, no neck masses, no thyroid tenderness, no thyromegaly LYMPHATICS: No LAN in the neck, no supraclavicular LAN RESPIRATORY: Breathing is even & unlabored, BS CTAB CARDIAC: RRR, no murmur,no extra heart sounds, no edema GI: Abdomen soft, normal BS, no masses, no tenderness, no hepatomegaly, no splenomegaly MUSCULOSKELETAL: No deformities. Movement at each extremity is full and painless. Strength is 5/5 at each extremity. Back is without kyphosis or scoliosis CIRCULATION: Pedal pulses are 2+. There is no edema of the legs, ankles and feet NEUROLOGICAL: There is no tremor. Speech is clear PSYCHIATRIC: Alert and oriented X 3. Affect and behavior are appropriate  Labs reviewed: Recent Labs    05/26/18 0600 06/05/18 1130 10/17/18 1738  NA 138 135 132*  K 3.7 4.3 4.0  CL 105 102 99  CO2 25 25 27   GLUCOSE 101* 103* 126*  BUN 13 25* 19  CREATININE 0.87 0.77 0.94  CALCIUM 8.5* 9.0 9.1   Recent Labs    05/23/18 1313 05/26/18 0600 10/17/18 1738  AST 51* 45* 37  ALT 56 53 28  ALKPHOS 51 47 52  BILITOT 0.5 0.6 0.9  PROT 5.9* 5.4* 6.2*  ALBUMIN 3.6 3.3* 4.0   Recent Labs    06/19/18 1238 10/17/18 1738 02/26/19 1140  WBC 4.3 5.2 4.6  NEUTROABS 2.7 3.5 3.0  HGB 10.9* 10.4* 10.6*  HCT 32.0* 31.3* 32.2*  MCV 103.0* 103.3* 102.2*  PLT 205.0 196 159   Lab Results  Component Value Date   TSH 3.14 03/06/2018    Lab Results  Component Value Date   CHOL 187 03/06/2018   HDL 66.40 03/06/2018   LDLCALC 104 (H) 03/06/2018   LDLDIRECT 105.9 03/13/2012   TRIG 81.0 03/06/2018   CHOLHDL 3 03/06/2018     Assessment/Plan ***   Family/ staff Communication: ***  Labs/tests ordered:  ***  Goals of care:   Long-term care.   Durenda Age, NP [facility] [phone] (Monday-Friday 8:00 a.m. - 5:00 p.m.) [phone] (after hours)

## 2019-03-05 ENCOUNTER — Other Ambulatory Visit
Admission: RE | Admit: 2019-03-05 | Discharge: 2019-03-05 | Disposition: A | Discharge status: Home / Self Care | Payer: Medicare PPO | Source: Ambulatory Visit | Attending: Adult Health | Admitting: Adult Health

## 2019-03-05 DIAGNOSIS — Z7901 Long term (current) use of anticoagulants: Secondary | ICD-10-CM | POA: Diagnosis present

## 2019-03-05 LAB — PROTIME-INR
INR: 2.4 — ABNORMAL HIGH (ref 0.8–1.2)
Prothrombin Time: 26 seconds — ABNORMAL HIGH (ref 11.4–15.2)

## 2019-03-11 ENCOUNTER — Encounter
Admission: RE | Admit: 2019-03-11 | Discharge: 2019-03-11 | Disposition: A | Discharge status: Home / Self Care | Payer: No Typology Code available for payment source | Source: Ambulatory Visit | Attending: Internal Medicine | Admitting: Internal Medicine

## 2019-03-19 ENCOUNTER — Other Ambulatory Visit
Admission: RE | Admit: 2019-03-19 | Discharge: 2019-03-19 | Disposition: A | Discharge status: Home / Self Care | Payer: Medicare PPO | Source: Skilled Nursing Facility | Attending: Adult Health | Admitting: Adult Health

## 2019-03-19 DIAGNOSIS — Z7901 Long term (current) use of anticoagulants: Secondary | ICD-10-CM | POA: Insufficient documentation

## 2019-03-19 LAB — PROTIME-INR
INR: 1.9 — ABNORMAL HIGH (ref 0.8–1.2)
Prothrombin Time: 21.3 seconds — ABNORMAL HIGH (ref 11.4–15.2)

## 2019-03-26 ENCOUNTER — Other Ambulatory Visit
Admission: RE | Admit: 2019-03-26 | Discharge: 2019-03-26 | Disposition: A | Discharge status: Home / Self Care | Payer: No Typology Code available for payment source | Source: Ambulatory Visit | Attending: Adult Health | Admitting: Adult Health

## 2019-03-26 DIAGNOSIS — Z7901 Long term (current) use of anticoagulants: Secondary | ICD-10-CM | POA: Insufficient documentation

## 2019-03-27 ENCOUNTER — Other Ambulatory Visit
Admission: RE | Admit: 2019-03-27 | Discharge: 2019-03-27 | Disposition: A | Discharge status: Home / Self Care | Payer: Medicare PPO | Source: Ambulatory Visit | Attending: Adult Health | Admitting: Adult Health

## 2019-03-27 DIAGNOSIS — Z7901 Long term (current) use of anticoagulants: Secondary | ICD-10-CM | POA: Diagnosis present

## 2019-03-27 LAB — PROTIME-INR
INR: 1.7 — ABNORMAL HIGH (ref 0.8–1.2)
Prothrombin Time: 19.4 seconds — ABNORMAL HIGH (ref 11.4–15.2)

## 2019-04-02 ENCOUNTER — Encounter: Payer: Self-pay | Admitting: Adult Health

## 2019-04-02 ENCOUNTER — Non-Acute Institutional Stay: Payer: Medicare PPO | Admitting: Adult Health

## 2019-04-02 ENCOUNTER — Other Ambulatory Visit
Admission: RE | Admit: 2019-04-02 | Discharge: 2019-04-02 | Disposition: A | Discharge status: Home / Self Care | Payer: Medicare PPO | Source: Skilled Nursing Facility | Attending: Adult Health | Admitting: Adult Health

## 2019-04-02 DIAGNOSIS — I5022 Chronic systolic (congestive) heart failure: Secondary | ICD-10-CM

## 2019-04-02 DIAGNOSIS — R3911 Hesitancy of micturition: Secondary | ICD-10-CM

## 2019-04-02 DIAGNOSIS — H402230 Chronic angle-closure glaucoma, bilateral, stage unspecified: Secondary | ICD-10-CM

## 2019-04-02 DIAGNOSIS — G4709 Other insomnia: Secondary | ICD-10-CM

## 2019-04-02 DIAGNOSIS — D508 Other iron deficiency anemias: Secondary | ICD-10-CM | POA: Diagnosis not present

## 2019-04-02 DIAGNOSIS — N401 Enlarged prostate with lower urinary tract symptoms: Secondary | ICD-10-CM

## 2019-04-02 DIAGNOSIS — I10 Essential (primary) hypertension: Secondary | ICD-10-CM | POA: Diagnosis not present

## 2019-04-02 DIAGNOSIS — I4821 Permanent atrial fibrillation: Secondary | ICD-10-CM

## 2019-04-02 DIAGNOSIS — K5904 Chronic idiopathic constipation: Secondary | ICD-10-CM

## 2019-04-02 DIAGNOSIS — Z7901 Long term (current) use of anticoagulants: Secondary | ICD-10-CM | POA: Insufficient documentation

## 2019-04-02 DIAGNOSIS — K219 Gastro-esophageal reflux disease without esophagitis: Secondary | ICD-10-CM

## 2019-04-02 LAB — PROTIME-INR
INR: 1.9 — ABNORMAL HIGH (ref 0.8–1.2)
Prothrombin Time: 21.4 seconds — ABNORMAL HIGH (ref 11.4–15.2)

## 2019-04-02 NOTE — Progress Notes (Signed)
[facility] Room Number: [place] of Service:  ALF [phone]) Provider:  Durenda Age, NP  Patient Care Team: Kirk Ruths, MD as PCP - General (Internal Medicine) Gerlene Fee, NP as Nurse Practitioner (Geriatric Medicine)  Extended Emergency Contact Information Primary Emergency Contact: Wishart,Willard G Address: [address]          [place], [place] [address] [place] Phone: [phone] Relation: Brother Secondary Emergency Contact: [place] Mobile Phone: [phone] Relation: Niece  Code Status:  DNR  Goals of care: Advanced Directive information Advanced Directives 04/02/2019  Does Patient Have a Medical Advance Directive? Yes  Type of Advance Directive Out of facility DNR (pink MOST or yellow form)  Does patient want to make changes to medical advance directive? No - Patient declined  Copy of [place] in Chart? -  Would patient like information on creating a medical advance directive? -  Pre-existing out of facility DNR order (yellow form or pink MOST form) Yellow form placed in chart (order not valid for inpatient use)     Chief Complaint  Patient presents with  . Medical Management of Chronic Issues    Routine Visit    HPI:  Pt is a 85 y.o. male seen today for medical management of chronic diseases.  He has PMH of systolic heart failure, atrial fibrillation, cardiomyopathy and GERD.  He was seen in the room today.  INR on 03/27/2019 was 1.7, subtherapeutic.  He takes Coumadin for paroxysmal atrial fibrillation and history of prosthetic heart valve. Coumadin was increased to 4 mg daily and due for repeat INR today. He verbalized having occasional difficulty sleeping. When he wakes up at midnight, he has a hard time going back to sleep and so additional Melatonin PRN was added.    Past Medical History:  Diagnosis Date  . Atrial fibrillation ([place])   . Cancer ([place])    melanoma- right ear  . Cardiomyopathy, secondary ([place])   . Chronic headaches started age 78  . Congestive heart failure (CHF) ([place])   . Coronary artery disease involving autologous vein coronary bypass graft with angina pectoris ([place]) 10/18/2018  . Coronary atherosclerosis of native coronary artery   . DJD (degenerative joint disease)   . Essential hypertension, benign   . GERD (gastroesophageal reflux disease)   . Glaucoma   . History of mitral valve replacement with mechanical valve   . Hyperlipidemia, unspecified   . Hypertension   . Lumbago   . Osteoporosis    secondary to low testoerone  . S/P MVR (mitral valve replacement)   . Sciatica   . Scoliosis    Past Surgical History:  Procedure Laterality Date  . CARDIOVERSION  2013  . CATARACT EXTRACTION, BILATERAL     x 2  . CORONARY ARTERY BYPASS GRAFT  12/2014   coronary artery bypass w/arterial grafts   . HERNIA REPAIR     x 2  . MELANOMA EXCISION     right ear  . MITRAL VALVE REPLACEMENT  1994   [facility]  . NASAL SEPTUM SURGERY  1963  . PERMANENT PACEMAKER INSERTION Left 12/22/2014    Allergies  Allergen Reactions  . Cymbalta [Duloxetine Hcl] Other (See Comments)    Urinary retention, constipation and insomnia  . Aspirin     Heart operation was told to never take Aspirin    Outpatient Encounter Medications as of 04/02/2019  Medication Sig  . acetaminophen (TYLENOL) 325 MG  tablet Take 650 mg by mouth every 4 (four) hours as needed for fever. Maximum dose for 24 hours is 3000 mg from all sources of Acetaminophen/tylenol  . brimonidine (ALPHAGAN) 0.15 % ophthalmic solution Place 1 drop into both eyes 2 (two) times daily.   . Cholecalciferol (VITAMIN D) 50 MCG (2000 UT) tablet Take 2,000 Units by mouth daily.  . Coenzyme Q10 10 MG capsule Take 100 mg by mouth 4 (four) times daily.  Marland Kitchen docusate sodium (COLACE) 100 MG capsule Take 2 capsules (200 mg total) by mouth at bedtime.  . ferrous sulfate 325 (65 FE) MG tablet  Take 1 tablet by mouth every other day.   . furosemide (LASIX) 20 MG tablet Take 1 tablet (20 mg total) by mouth daily.  Marland Kitchen latanoprost (XALATAN) 0.005 % ophthalmic solution Place 1 drop into both eyes at bedtime.   Marland Kitchen loperamide (IMODIUM) 2 MG capsule Take 2-4 mg by mouth as needed for diarrhea or loose stools. Take 2 tablets to = 4 mg for initial loose stool, take 1 tablet to = 2 mg for each subsequent loose stool for up to 8 doses/24 hours  . losartan (COZAAR) 100 MG tablet TAKE 1 TABLET BY MOUTH DAILY  . meclizine (ANTIVERT) 25 MG tablet Take 25 mg by mouth 3 (three) times daily as needed for dizziness.  . Melatonin 3 MG TABS Take 1 tablet by mouth at bedtime.   . metoprolol tartrate (LOPRESSOR) 25 MG tablet Take 25 mg by mouth 2 (two) times daily. Hold for BP less than 100/'s or pulse less than 60  . niacinamide 500 MG tablet Take 500 mg by mouth daily.  . NON FORMULARY Diet Type: regular  . omeprazole (PRILOSEC) 40 MG capsule Take 40 mg by mouth 2 (two) times daily.  . ondansetron (ZOFRAN-ODT) 4 MG disintegrating tablet Take 4 mg by mouth every 4 (four) hours as needed for nausea or vomiting.  . polyethylene glycol (MIRALAX / GLYCOLAX) packet Take 17 g by mouth daily.  . tamsulosin (FLOMAX) 0.4 MG CAPS capsule TAKE 1 CAPSULE BY MOUTH EVERY DAY  . warfarin (COUMADIN) 4 MG tablet Take 4 mg by mouth daily.  . [DISCONTINUED] amitriptyline (ELAVIL) 25 MG tablet Take 1 tablet (25 mg total) by mouth at bedtime.  . [DISCONTINUED] warfarin (COUMADIN) 3 MG tablet Take 3 mg by mouth daily.   No facility-administered encounter medications on file as of 04/02/2019.     Review of Systems  GENERAL: No change in appetite, no fatigue, no weight changes, no fever, chills or weakness MOUTH and THROAT: Denies oral discomfort, gingival pain or bleeding, pain from teeth or hoarseness   RESPIRATORY: no cough, SOB, DOE, wheezing, hemoptysis CARDIAC: No chest pain, edema or palpitations GI: No abdominal pain,  diarrhea, constipation, heart burn, nausea or vomiting GU: Denies dysuria, frequency, hematuria, incontinence, or discharge MUSCULOSKELETAL: Denies joint pain, muscle pain, back pain, restricted movement, or unusual weakness NEUROLOGICAL: Denies dizziness, syncope, numbness, or headache PSYCHIATRIC: Denies feelings of depression or anxiety. No report of hallucinations, insomnia, paranoia, or agitation   Immunization History  Administered Date(s) Administered  . Influenza Split 09/17/2013, 09/23/2014  . Influenza, High Dose Seasonal PF 09/03/2017, 09/29/2018  . Influenza-Unspecified 09/09/2016, 08/28/2017  . Pneumococcal Polysaccharide-23 06/25/2004, 01/22/2019  . Tdap 10/14/2014  . Zoster 06/26/2011   Pertinent  Health Maintenance Due  Topic Date Due  . INFLUENZA VACCINE  07/11/2019  . PNA vac Low Risk Adult (2 of 2 - PCV13) 01/23/2020   Fall Risk  07/09/2018 01/21/2017 01/22/2015 02/15/2014  Falls in the past year? Yes No No No  Number falls in past yr: 1 - - -  Injury with Fall? Yes - - -  Risk Factor Category  High Fall Risk - - -     Vitals:   04/02/19 1125  BP: (!) 115/54  Pulse: 63  Resp: 18  Temp: (!) 96.8 F (36 C)  TempSrc: Oral  SpO2: 99%  Weight: 133 lb 8 oz (60.6 kg)  Height: 5\' 7"  (1.702 m)   Body mass index is 20.91 kg/m.  Physical Exam  GENERAL APPEARANCE:  In no acute distress.  SKIN:  Skin is warm and dry.  MOUTH and THROAT: Lips are without lesions. Oral mucosa is moist and without lesions. Tongue is normal in shape, size, and color and without lesions RESPIRATORY: Breathing is even & unlabored, BS CTAB CARDIAC: RRR, no murmur,no extra heart sounds, no edema, Left chest pacemaker GI: Abdomen soft, normal BS, no masses, no tenderness EXTREMITIES:  Able to move X 4 extremities NEUROLOGICAL: There is no tremor. Speech is clear. Alert and oriented X 3. PSYCHIATRIC:  Affect and behavior are appropriate   Labs reviewed: Recent Labs    05/26/18 0600  06/05/18 1130 10/17/18 1738  NA 138 135 132*  K 3.7 4.3 4.0  CL 105 102 99  CO2 25 25 27   GLUCOSE 101* 103* 126*  BUN 13 25* 19  CREATININE 0.87 0.77 0.94  CALCIUM 8.5* 9.0 9.1   Recent Labs    05/23/18 1313 05/26/18 0600 10/17/18 1738  AST 51* 45* 37  ALT 56 53 28  ALKPHOS 51 47 52  BILITOT 0.5 0.6 0.9  PROT 5.9* 5.4* 6.2*  ALBUMIN 3.6 3.3* 4.0   Recent Labs    06/19/18 1238 10/17/18 1738 02/26/19 1140  WBC 4.3 5.2 4.6  NEUTROABS 2.7 3.5 3.0  HGB 10.9* 10.4* 10.6*  HCT 32.0* 31.3* 32.2*  MCV 103.0* 103.3* 102.2*  PLT 205.0 196 159   Lab Results  Component Value Date   TSH 3.14 03/06/2018   No results found for: HGBA1C Lab Results  Component Value Date   CHOL 187 03/06/2018   HDL 66.40 03/06/2018   LDLCALC 104 (H) 03/06/2018   LDLDIRECT 105.9 03/13/2012   TRIG 81.0 03/06/2018   CHOLHDL 3 03/06/2018     Assessment/Plan  1. Permanent atrial fibrillation - rate-controlled, continue metoprolol tartrate 25 mg 1 tab twice a day -INR today is 1.9, will continue Coumadin 4 mg daily and repeat INR on 04/16/19  2. Other iron deficiency anemia Lab Results  Component Value Date   HGB 10.6 (L) 02/26/2019  -Continue ferrous sulfate 325 mg 1 tab daily  3. Benign essential HTN -Well-controlled, continue losartan 100 mg 1 tab daily, metoprolol tartrate 25 mg 1 tab twice a day  4. Chronic systolic heart failure (HCC) -Stable, continue Lasix 20 mg 1 tab daily and metoprolol tartrate 25 mg 1 tab twice a day  5. GERD without esophagitis -Continue omeprazole 40 mg 1 capsule twice a day  6. Chronic idiopathic constipation -Denies constipation, continue Colace 100 mg 1 capsule at bedtime and MiraLAX 17 g daily  7. Benign prostatic hyperplasia with urinary hesitancy -Denies urinary retention, continue tamsulosin 0.4 mg 1 capsule daily  8. Chronic angle-closure glaucoma of both eyes, unspecified glaucoma stage -Denies eye pains, will continue Alphagan and  latanoprost eyedrops  9.  Insomnia -Continue melatonin 3 mg 1 tab at bedtime and PRN   Family/ staff  Communication: Discussed plan of care with resident.  Labs/tests ordered:  None  Goals of care:   Long-term ALF   Durenda Age, NP [facility] and Adult Medicine [phone] (Monday-Friday 8:00 a.m. - 5:00 p.m.) [phone] (after hours)

## 2019-04-10 ENCOUNTER — Encounter
Admission: RE | Admit: 2019-04-10 | Discharge: 2019-04-10 | Disposition: A | Discharge status: Home / Self Care | Payer: No Typology Code available for payment source | Source: Ambulatory Visit | Attending: Internal Medicine | Admitting: Internal Medicine

## 2019-04-17 ENCOUNTER — Other Ambulatory Visit
Admission: RE | Admit: 2019-04-17 | Discharge: 2019-04-17 | Disposition: A | Discharge status: Home / Self Care | Payer: Medicare PPO | Source: Ambulatory Visit | Attending: Internal Medicine | Admitting: Internal Medicine

## 2019-04-17 DIAGNOSIS — Z7901 Long term (current) use of anticoagulants: Secondary | ICD-10-CM | POA: Diagnosis present

## 2019-04-17 LAB — PROTIME-INR
INR: 2.7 — ABNORMAL HIGH (ref 0.8–1.2)
Prothrombin Time: 28.6 seconds — ABNORMAL HIGH (ref 11.4–15.2)

## 2019-05-11 ENCOUNTER — Encounter
Admission: RE | Admit: 2019-05-11 | Discharge: 2019-05-11 | Disposition: A | Discharge status: Home / Self Care | Payer: No Typology Code available for payment source | Source: Ambulatory Visit | Attending: Internal Medicine | Admitting: Internal Medicine

## 2019-05-25 ENCOUNTER — Non-Acute Institutional Stay: Payer: Medicare PPO | Admitting: Adult Health

## 2019-05-25 ENCOUNTER — Encounter: Payer: Self-pay | Admitting: Adult Health

## 2019-05-25 DIAGNOSIS — K219 Gastro-esophageal reflux disease without esophagitis: Secondary | ICD-10-CM | POA: Diagnosis not present

## 2019-05-25 DIAGNOSIS — K5904 Chronic idiopathic constipation: Secondary | ICD-10-CM | POA: Diagnosis not present

## 2019-05-25 DIAGNOSIS — I10 Essential (primary) hypertension: Secondary | ICD-10-CM | POA: Insufficient documentation

## 2019-05-25 DIAGNOSIS — D508 Other iron deficiency anemias: Secondary | ICD-10-CM

## 2019-05-25 DIAGNOSIS — I4821 Permanent atrial fibrillation: Secondary | ICD-10-CM

## 2019-05-25 DIAGNOSIS — G4700 Insomnia, unspecified: Secondary | ICD-10-CM

## 2019-05-25 NOTE — Progress Notes (Signed)
[facility]  [facility] Room Number: [name]:  ALF [phone]) Provider:  Durenda Age, DNP, FNP-BC  Patient Care Team: Kirk Ruths, MD as PCP - General (Internal Medicine) Gerlene Fee, NP as Nurse Practitioner (Geriatric Medicine)  Extended Emergency Contact Information Primary Emergency Contact: Stemen,Willard G Address: [address]          [place], [place] [address] [place] Phone: [phone] Relation: Brother Secondary Emergency Contact: [place] Mobile Phone: [phone] Relation: Niece  Code Status:  DNR  Goals of care: Advanced Directive information Advanced Directives 05/25/2019  Does Patient Have a Medical Advance Directive? -  Type of Advance Directive Out of facility DNR (pink MOST or yellow form)  Does patient want to make changes to medical advance directive? No - Patient declined  Copy of [place] in Chart? -  Would patient like information on creating a medical advance directive? -  Pre-existing out of facility DNR order (yellow form or pink MOST form) Yellow form placed in chart (order not valid for inpatient use)     Chief Complaint  Patient presents with  . Medical Management of Chronic Issues    Routine TVAB visit    HPI:  Pt is a 85 y.o. male seen today for medical management of chronic diseases. He has PMH of systolic heart failure, atrial fibrillation, cardiomyopathy and GERD. He was seen today in his room. He said that he is tired of being quarantined. He said that his room is his world right now and it is too small. He does not recall being quarantined when there was a poli pandemic (was 28 to 85 years old at that time). He is thankful that he is not sick right now. BPs noted to be elevated -154/77, 132/72, 167/66.  He said that he sleeps well at night.    Past Medical History:  Diagnosis Date  . Atrial fibrillation ([place])   . Cancer ([place])    melanoma- right ear  . Cardiomyopathy, secondary ([place])   . Chronic headaches started age 55  . Congestive heart failure (CHF) ([place])   . Coronary artery disease involving autologous vein coronary bypass graft with angina pectoris ([place]) 10/18/2018  . Coronary atherosclerosis of native coronary artery   . DJD (degenerative joint disease)   . Essential hypertension, benign   . GERD (gastroesophageal reflux disease)   . Glaucoma   . History of mitral valve replacement with mechanical valve   . Hyperlipidemia, unspecified   . Hypertension   . Lumbago   . Osteoporosis    secondary to low testoerone  . S/P MVR (mitral valve replacement)   . Sciatica   . Scoliosis    Past Surgical History:  Procedure Laterality Date  . CARDIOVERSION  2013  . CATARACT EXTRACTION, BILATERAL     x 2  . CORONARY ARTERY BYPASS GRAFT  12/2014   coronary artery bypass w/arterial grafts   . HERNIA REPAIR     x 2  . MELANOMA EXCISION     right ear  . MITRAL VALVE REPLACEMENT  1994   [facility]  . NASAL SEPTUM SURGERY  1963  . PERMANENT PACEMAKER INSERTION Left 12/22/2014    Allergies  Allergen Reactions  . Cymbalta [Duloxetine Hcl] Other (See Comments)    Urinary retention, constipation and insomnia  . Aspirin     Heart operation was told to never take Aspirin    Outpatient Encounter Medications as of  05/25/2019  Medication Sig  . acetaminophen (TYLENOL) 325 MG tablet Take 650 mg by mouth every 4 (four) hours as needed for fever. Maximum dose for 24 hours is 3000 mg from all sources of Acetaminophen/tylenol  . brimonidine (ALPHAGAN) 0.15 % ophthalmic solution Place 1 drop into both eyes 2 (two) times daily.   . Cholecalciferol (VITAMIN D) 50 MCG (2000 UT) tablet Take 2,000 Units by mouth daily.  . Coenzyme Q10 10 MG capsule Take 100 mg by mouth 4 (four) times daily.  Marland Kitchen docusate sodium (COLACE) 100 MG capsule Take 2 capsules (200 mg total) by mouth at bedtime.  . ferrous sulfate 325 (65 FE) MG tablet  Take 1 tablet by mouth every other day.   . furosemide (LASIX) 20 MG tablet Take 1 tablet (20 mg total) by mouth daily.  Marland Kitchen latanoprost (XALATAN) 0.005 % ophthalmic solution Place 1 drop into both eyes at bedtime.   Marland Kitchen loperamide (IMODIUM) 2 MG capsule Take 2-4 mg by mouth as needed for diarrhea or loose stools. Take 2 tablets to = 4 mg for initial loose stool, take 1 tablet to = 2 mg for each subsequent loose stool for up to 8 doses/24 hours  . losartan (COZAAR) 100 MG tablet TAKE 1 TABLET BY MOUTH DAILY  . meclizine (ANTIVERT) 25 MG tablet Take 25 mg by mouth 3 (three) times daily as needed for dizziness.  . Melatonin 3 MG TABS Take 1 tablet by mouth at bedtime.   . metoprolol tartrate (LOPRESSOR) 25 MG tablet Take 25 mg by mouth 2 (two) times daily. Hold for BP less than 100/'s or pulse less than 60  . niacinamide 500 MG tablet Take 500 mg by mouth daily.  . NON FORMULARY Diet Type: regular  . Nutritional Supplements (ENSURE ENLIVE PO) Take 0.08 g by mouth every other day.  Marland Kitchen omeprazole (PRILOSEC) 40 MG capsule Take 40 mg by mouth 2 (two) times daily.  . ondansetron (ZOFRAN-ODT) 4 MG disintegrating tablet Take 4 mg by mouth every 4 (four) hours as needed for nausea or vomiting.  . polyethylene glycol (MIRALAX / GLYCOLAX) packet Take 17 g by mouth daily.  . tamsulosin (FLOMAX) 0.4 MG CAPS capsule TAKE 1 CAPSULE BY MOUTH EVERY DAY  . warfarin (COUMADIN) 4 MG tablet Take 4 mg by mouth daily.  . [DISCONTINUED] amitriptyline (ELAVIL) 25 MG tablet Take 1 tablet (25 mg total) by mouth at bedtime.   No facility-administered encounter medications on file as of 05/25/2019.     Review of Systems  GENERAL: No change in appetite, no fatigue, no weight changes, no fever, chills or weakness MOUTH and THROAT: Denies oral discomfort, gingival pain or bleeding RESPIRATORY: no cough, SOB, DOE, wheezing, hemoptysis CARDIAC: No chest pain, edema or palpitations, left chest pacemaker GI: No abdominal pain,  diarrhea, constipation, heart burn, nausea or vomiting GU: Denies dysuria, frequency, hematuria, incontinence, or discharge NEUROLOGICAL: + headache PSYCHIATRIC: Denies feelings of depression or anxiety. No report of hallucinations, insomnia, paranoia, or agitation    Immunization History  Administered Date(s) Administered  . Influenza Split 09/17/2013, 09/23/2014  . Influenza, High Dose Seasonal PF 09/03/2017, 09/29/2018  . Influenza-Unspecified 09/09/2016, 08/28/2017  . Pneumococcal Polysaccharide-23 06/25/2004, 01/22/2019  . Tdap 10/14/2014  . Zoster 06/26/2011   Pertinent  Health Maintenance Due  Topic Date Due  . INFLUENZA VACCINE  07/11/2019  . PNA vac Low Risk Adult (2 of 2 - PCV13) 01/23/2020   Fall Risk  07/09/2018 01/21/2017 01/22/2015 02/15/2014  Falls in the past  year? Yes No No No  Number falls in past yr: 1 - - -  Injury with Fall? Yes - - -  Risk Factor Category  High Fall Risk - - -     Vitals:   05/25/19 1546  BP: (!) 154/77  Pulse: 80  Resp: 20  Temp: (!) 96 F (35.6 C)  SpO2: 96%  Weight: 129 lb 8 oz (58.7 kg)  Height: 5\' 7"  (1.702 m)   Body mass index is 20.28 kg/m.  Physical Exam  GENERAL APPEARANCE: Well nourished. In no acute distress. Normal body habitus SKIN:  Skin is warm and dry. MOUTH and THROAT: Lips are without lesions. Oral mucosa is moist and without lesions. Tongue is normal in shape, size, and color and without lesions RESPIRATORY: Breathing is even & unlabored, BS CTAB CARDIAC: RRR, no murmur,no extra heart sounds, no edema GI: Abdomen soft, normal BS, no masses, no tenderness EXTREMITIES:  Able to move X 4 extremities NEUROLOGICAL: There is no tremor. Speech is clear. Alert and oriented X 3. PSYCHIATRIC:  Affect and behavior are appropriate  Labs reviewed: Recent Labs    05/26/18 0600 06/05/18 1130 10/17/18 1738  NA 138 135 132*  K 3.7 4.3 4.0  CL 105 102 99  CO2 25 25 27   GLUCOSE 101* 103* 126*  BUN 13 25* 19   CREATININE 0.87 0.77 0.94  CALCIUM 8.5* 9.0 9.1   Recent Labs    05/26/18 0600 10/17/18 1738  AST 45* 37  ALT 53 28  ALKPHOS 47 52  BILITOT 0.6 0.9  PROT 5.4* 6.2*  ALBUMIN 3.3* 4.0   Recent Labs    06/19/18 1238 10/17/18 1738 02/26/19 1140  WBC 4.3 5.2 4.6  NEUTROABS 2.7 3.5 3.0  HGB 10.9* 10.4* 10.6*  HCT 32.0* 31.3* 32.2*  MCV 103.0* 103.3* 102.2*  PLT 205.0 196 159   Lab Results  Component Value Date   TSH 3.14 03/06/2018    Lab Results  Component Value Date   CHOL 187 03/06/2018   HDL 66.40 03/06/2018   LDLCALC 104 (H) 03/06/2018   LDLDIRECT 105.9 03/13/2012   TRIG 81.0 03/06/2018   CHOLHDL 3 03/06/2018     Assessment/Plan  1. Uncontrolled hypertension - BPs elevated, will start Amlodipine 5 mg daily, continue losartan 100 mg 1 tab daily, metoprolol tartrate 25 mg 1 tab twice a day  2. Permanent atrial fibrillation -Rate controlled continue Coumadin 4 mg 1 tab daily and metoprolol tartrate 25 mg 1 tab twice a day, INR 2.7 (04/17/19), therapeutic  3. GERD without esophagitis -Continue omeprazole 40 mg 1 capsule twice a day  4. Chronic idiopathic constipation -Denies constipation, continue Colace 100 mg 2 capsules at bedtime, MiraLAX 17 g daily  5. Other iron deficiency anemia Lab Results  Component Value Date   HGB 10.6 (L) 02/26/2019  -Continue ferrous sulfate 325 mg 1 tab every other day  6. Insomnia, unspecified type -Sleeps well at night, continue melatonin 3 mg 1 tab at bedtime as needed    Family/ staff Communication: Discussed plan of care with resident.  Labs/tests ordered: None  Goals of care:   Long-term care.   Durenda Age, DNP, FNP-BC [facility] and Adult Medicine [phone] (Monday-Friday 8:00 a.m. - 5:00 p.m.) [phone] (after hours)

## 2019-06-02 ENCOUNTER — Other Ambulatory Visit
Admission: RE | Admit: 2019-06-02 | Discharge: 2019-06-02 | Disposition: A | Discharge status: Home / Self Care | Payer: Medicare PPO | Source: Skilled Nursing Facility | Attending: Internal Medicine | Admitting: Internal Medicine

## 2019-06-02 DIAGNOSIS — Z7901 Long term (current) use of anticoagulants: Secondary | ICD-10-CM | POA: Insufficient documentation

## 2019-06-02 LAB — PROTIME-INR
INR: 3.4 — ABNORMAL HIGH (ref 0.8–1.2)
Prothrombin Time: 33.5 seconds — ABNORMAL HIGH (ref 11.4–15.2)

## 2019-06-10 ENCOUNTER — Encounter
Admission: RE | Admit: 2019-06-10 | Discharge: 2019-06-10 | Disposition: A | Discharge status: Home / Self Care | Payer: No Typology Code available for payment source | Source: Ambulatory Visit | Attending: Internal Medicine | Admitting: Internal Medicine

## 2019-06-18 ENCOUNTER — Other Ambulatory Visit
Admission: RE | Admit: 2019-06-18 | Discharge: 2019-06-18 | Disposition: A | Discharge status: Home / Self Care | Payer: Medicare PPO | Source: Skilled Nursing Facility | Attending: Internal Medicine | Admitting: Internal Medicine

## 2019-06-18 DIAGNOSIS — Z7901 Long term (current) use of anticoagulants: Secondary | ICD-10-CM | POA: Insufficient documentation

## 2019-06-18 DIAGNOSIS — Z5181 Encounter for therapeutic drug level monitoring: Secondary | ICD-10-CM | POA: Diagnosis not present

## 2019-06-18 LAB — PROTIME-INR
INR: 3.7 — ABNORMAL HIGH (ref 0.8–1.2)
Prothrombin Time: 36 seconds — ABNORMAL HIGH (ref 11.4–15.2)

## 2019-07-04 ENCOUNTER — Other Ambulatory Visit
Admission: RE | Admit: 2019-07-04 | Discharge: 2019-07-04 | Disposition: A | Discharge status: Home / Self Care | Payer: Medicare PPO | Source: Ambulatory Visit | Attending: Internal Medicine | Admitting: Internal Medicine

## 2019-07-04 DIAGNOSIS — Z7901 Long term (current) use of anticoagulants: Secondary | ICD-10-CM | POA: Insufficient documentation

## 2019-07-04 LAB — PROTIME-INR
INR: 4.7 (ref 0.8–1.2)
Prothrombin Time: 43.1 seconds — ABNORMAL HIGH (ref 11.4–15.2)

## 2019-07-06 ENCOUNTER — Other Ambulatory Visit
Admission: RE | Admit: 2019-07-06 | Discharge: 2019-07-06 | Disposition: A | Discharge status: Home / Self Care | Payer: Medicare PPO | Source: Other Acute Inpatient Hospital | Attending: Internal Medicine | Admitting: Internal Medicine

## 2019-07-06 DIAGNOSIS — Z7901 Long term (current) use of anticoagulants: Secondary | ICD-10-CM | POA: Diagnosis not present

## 2019-07-06 LAB — PROTIME-INR
INR: 3.3 — ABNORMAL HIGH (ref 0.8–1.2)
Prothrombin Time: 33.3 seconds — ABNORMAL HIGH (ref 11.4–15.2)

## 2019-07-07 ENCOUNTER — Other Ambulatory Visit
Admission: RE | Admit: 2019-07-07 | Discharge: 2019-07-07 | Disposition: A | Discharge status: Home / Self Care | Payer: Medicare PPO | Source: Ambulatory Visit | Attending: Internal Medicine | Admitting: Internal Medicine

## 2019-07-07 DIAGNOSIS — Z7901 Long term (current) use of anticoagulants: Secondary | ICD-10-CM | POA: Diagnosis not present

## 2019-07-07 LAB — PROTIME-INR
INR: 2.8 — ABNORMAL HIGH (ref 0.8–1.2)
Prothrombin Time: 28.9 seconds — ABNORMAL HIGH (ref 11.4–15.2)

## 2019-07-10 ENCOUNTER — Other Ambulatory Visit
Admission: RE | Admit: 2019-07-10 | Discharge: 2019-07-10 | Disposition: A | Discharge status: Home / Self Care | Payer: Medicare PPO | Source: Skilled Nursing Facility | Attending: Internal Medicine | Admitting: Internal Medicine

## 2019-07-10 DIAGNOSIS — Z5181 Encounter for therapeutic drug level monitoring: Secondary | ICD-10-CM | POA: Insufficient documentation

## 2019-07-10 DIAGNOSIS — Z7901 Long term (current) use of anticoagulants: Secondary | ICD-10-CM | POA: Diagnosis not present

## 2019-07-10 LAB — PROTIME-INR
INR: 1.6 — ABNORMAL HIGH (ref 0.8–1.2)
Prothrombin Time: 18.8 seconds — ABNORMAL HIGH (ref 11.4–15.2)

## 2019-07-11 ENCOUNTER — Encounter
Admission: RE | Admit: 2019-07-11 | Discharge: 2019-07-11 | Disposition: A | Discharge status: Home / Self Care | Payer: No Typology Code available for payment source | Source: Ambulatory Visit | Attending: Internal Medicine | Admitting: Internal Medicine

## 2019-07-14 DIAGNOSIS — I480 Paroxysmal atrial fibrillation: Secondary | ICD-10-CM | POA: Diagnosis not present

## 2019-07-14 DIAGNOSIS — I10 Essential (primary) hypertension: Secondary | ICD-10-CM | POA: Diagnosis not present

## 2019-07-14 DIAGNOSIS — I495 Sick sinus syndrome: Secondary | ICD-10-CM | POA: Diagnosis not present

## 2019-07-14 DIAGNOSIS — R0602 Shortness of breath: Secondary | ICD-10-CM | POA: Diagnosis not present

## 2019-07-14 DIAGNOSIS — E7800 Pure hypercholesterolemia, unspecified: Secondary | ICD-10-CM | POA: Diagnosis not present

## 2019-07-14 DIAGNOSIS — R410 Disorientation, unspecified: Secondary | ICD-10-CM | POA: Diagnosis not present

## 2019-07-16 ENCOUNTER — Other Ambulatory Visit
Admission: RE | Admit: 2019-07-16 | Discharge: 2019-07-16 | Disposition: A | Discharge status: Home / Self Care | Payer: Medicare PPO | Source: Ambulatory Visit | Attending: Internal Medicine | Admitting: Internal Medicine

## 2019-07-16 DIAGNOSIS — I4891 Unspecified atrial fibrillation: Secondary | ICD-10-CM | POA: Diagnosis not present

## 2019-07-16 LAB — PROTIME-INR
INR: 3.4 — ABNORMAL HIGH (ref 0.8–1.2)
Prothrombin Time: 34 seconds — ABNORMAL HIGH (ref 11.4–15.2)

## 2019-07-23 ENCOUNTER — Other Ambulatory Visit
Admission: RE | Admit: 2019-07-23 | Discharge: 2019-07-23 | Disposition: A | Discharge status: Home / Self Care | Payer: Medicare PPO | Source: Ambulatory Visit | Attending: Internal Medicine | Admitting: Internal Medicine

## 2019-07-23 DIAGNOSIS — Z7901 Long term (current) use of anticoagulants: Secondary | ICD-10-CM | POA: Insufficient documentation

## 2019-07-23 DIAGNOSIS — I4891 Unspecified atrial fibrillation: Secondary | ICD-10-CM | POA: Insufficient documentation

## 2019-07-23 LAB — PROTIME-INR
INR: 5.2 (ref 0.8–1.2)
Prothrombin Time: 46.8 seconds — ABNORMAL HIGH (ref 11.4–15.2)

## 2019-07-25 ENCOUNTER — Other Ambulatory Visit
Admission: RE | Admit: 2019-07-25 | Discharge: 2019-07-25 | Disposition: A | Discharge status: Home / Self Care | Payer: Medicare PPO | Source: Ambulatory Visit | Attending: Internal Medicine | Admitting: Internal Medicine

## 2019-07-25 DIAGNOSIS — Z7901 Long term (current) use of anticoagulants: Secondary | ICD-10-CM | POA: Insufficient documentation

## 2019-07-25 LAB — PROTIME-INR
INR: 4.6 (ref 0.8–1.2)
Prothrombin Time: 42.4 seconds — ABNORMAL HIGH (ref 11.4–15.2)

## 2019-07-27 ENCOUNTER — Other Ambulatory Visit
Admission: RE | Admit: 2019-07-27 | Discharge: 2019-07-27 | Disposition: A | Discharge status: Home / Self Care | Payer: Medicare PPO | Source: Ambulatory Visit | Attending: Internal Medicine | Admitting: Internal Medicine

## 2019-07-27 DIAGNOSIS — Z7901 Long term (current) use of anticoagulants: Secondary | ICD-10-CM | POA: Diagnosis not present

## 2019-07-27 LAB — PROTIME-INR
INR: 2.8 — ABNORMAL HIGH (ref 0.8–1.2)
Prothrombin Time: 28.7 seconds — ABNORMAL HIGH (ref 11.4–15.2)

## 2019-07-29 ENCOUNTER — Other Ambulatory Visit
Admission: RE | Admit: 2019-07-29 | Discharge: 2019-07-29 | Disposition: A | Discharge status: Home / Self Care | Payer: Medicare PPO | Source: Ambulatory Visit | Attending: Internal Medicine | Admitting: Internal Medicine

## 2019-07-29 DIAGNOSIS — Z7901 Long term (current) use of anticoagulants: Secondary | ICD-10-CM | POA: Insufficient documentation

## 2019-07-29 DIAGNOSIS — Z5181 Encounter for therapeutic drug level monitoring: Secondary | ICD-10-CM | POA: Insufficient documentation

## 2019-07-29 DIAGNOSIS — I4891 Unspecified atrial fibrillation: Secondary | ICD-10-CM | POA: Diagnosis not present

## 2019-07-29 LAB — PROTIME-INR
INR: 1.7 — ABNORMAL HIGH (ref 0.8–1.2)
Prothrombin Time: 19.9 seconds — ABNORMAL HIGH (ref 11.4–15.2)

## 2019-07-31 ENCOUNTER — Other Ambulatory Visit
Admission: RE | Admit: 2019-07-31 | Discharge: 2019-07-31 | Disposition: A | Discharge status: Home / Self Care | Payer: No Typology Code available for payment source | Source: Skilled Nursing Facility | Attending: Internal Medicine | Admitting: Internal Medicine

## 2019-08-01 ENCOUNTER — Other Ambulatory Visit
Admission: RE | Admit: 2019-08-01 | Discharge: 2019-08-01 | Disposition: A | Discharge status: Home / Self Care | Payer: Medicare PPO | Source: Ambulatory Visit | Attending: Internal Medicine | Admitting: Internal Medicine

## 2019-08-01 DIAGNOSIS — Z7901 Long term (current) use of anticoagulants: Secondary | ICD-10-CM | POA: Insufficient documentation

## 2019-08-01 DIAGNOSIS — I4891 Unspecified atrial fibrillation: Secondary | ICD-10-CM | POA: Insufficient documentation

## 2019-08-01 LAB — PROTIME-INR
INR: 2.3 — ABNORMAL HIGH (ref 0.8–1.2)
Prothrombin Time: 25.2 seconds — ABNORMAL HIGH (ref 11.4–15.2)

## 2019-08-07 ENCOUNTER — Other Ambulatory Visit
Admission: RE | Admit: 2019-08-07 | Discharge: 2019-08-07 | Disposition: A | Discharge status: Home / Self Care | Payer: Medicare PPO | Source: Ambulatory Visit | Attending: Internal Medicine | Admitting: Internal Medicine

## 2019-08-07 DIAGNOSIS — Z7901 Long term (current) use of anticoagulants: Secondary | ICD-10-CM | POA: Insufficient documentation

## 2019-08-07 DIAGNOSIS — Z5181 Encounter for therapeutic drug level monitoring: Secondary | ICD-10-CM | POA: Insufficient documentation

## 2019-08-07 LAB — PROTIME-INR
INR: 3 — ABNORMAL HIGH (ref 0.8–1.2)
Prothrombin Time: 30.4 seconds — ABNORMAL HIGH (ref 11.4–15.2)

## 2019-08-11 ENCOUNTER — Encounter
Admission: RE | Admit: 2019-08-11 | Discharge: 2019-08-11 | Disposition: A | Discharge status: Home / Self Care | Payer: No Typology Code available for payment source | Source: Ambulatory Visit | Attending: Internal Medicine | Admitting: Internal Medicine

## 2019-08-25 ENCOUNTER — Other Ambulatory Visit
Admission: RE | Admit: 2019-08-25 | Discharge: 2019-08-25 | Disposition: A | Discharge status: Home / Self Care | Payer: Medicare PPO | Source: Skilled Nursing Facility | Attending: Internal Medicine | Admitting: Internal Medicine

## 2019-08-25 DIAGNOSIS — R350 Frequency of micturition: Secondary | ICD-10-CM | POA: Insufficient documentation

## 2019-08-25 DIAGNOSIS — I4891 Unspecified atrial fibrillation: Secondary | ICD-10-CM | POA: Diagnosis not present

## 2019-08-25 LAB — URINALYSIS, COMPLETE (UACMP) WITH MICROSCOPIC
Bacteria, UA: NONE SEEN
Bilirubin Urine: NEGATIVE
Glucose, UA: NEGATIVE mg/dL
Hgb urine dipstick: NEGATIVE
Ketones, ur: NEGATIVE mg/dL
Leukocytes,Ua: NEGATIVE
Nitrite: NEGATIVE
Protein, ur: NEGATIVE mg/dL
Specific Gravity, Urine: 1.02 (ref 1.005–1.030)
Squamous Epithelial / HPF: NONE SEEN (ref 0–5)
pH: 6 (ref 5.0–8.0)

## 2019-08-25 LAB — PROTIME-INR
INR: 3.2 — ABNORMAL HIGH (ref 0.8–1.2)
Prothrombin Time: 32.2 seconds — ABNORMAL HIGH (ref 11.4–15.2)

## 2019-08-26 LAB — URINE CULTURE: Culture: NO GROWTH

## 2019-09-04 ENCOUNTER — Other Ambulatory Visit
Admission: RE | Admit: 2019-09-04 | Discharge: 2019-09-04 | Disposition: A | Discharge status: Home / Self Care | Payer: Medicare PPO | Source: Skilled Nursing Facility | Attending: Internal Medicine | Admitting: Internal Medicine

## 2019-09-04 DIAGNOSIS — Z7901 Long term (current) use of anticoagulants: Secondary | ICD-10-CM | POA: Insufficient documentation

## 2019-09-04 LAB — PROTIME-INR
INR: 3.6 — ABNORMAL HIGH (ref 0.8–1.2)
Prothrombin Time: 35.7 seconds — ABNORMAL HIGH (ref 11.4–15.2)

## 2019-09-11 ENCOUNTER — Encounter
Admission: RE | Admit: 2019-09-11 | Discharge: 2019-09-11 | Disposition: A | Discharge status: Home / Self Care | Payer: No Typology Code available for payment source | Source: Ambulatory Visit | Attending: Internal Medicine | Admitting: Internal Medicine

## 2019-09-11 ENCOUNTER — Other Ambulatory Visit
Admission: RE | Admit: 2019-09-11 | Discharge: 2019-09-11 | Disposition: A | Discharge status: Home / Self Care | Payer: Medicare PPO | Source: Skilled Nursing Facility | Attending: Internal Medicine | Admitting: Internal Medicine

## 2019-09-11 DIAGNOSIS — I480 Paroxysmal atrial fibrillation: Secondary | ICD-10-CM | POA: Insufficient documentation

## 2019-09-11 LAB — PROTIME-INR
INR: 4.5 (ref 0.8–1.2)
Prothrombin Time: 42 seconds — ABNORMAL HIGH (ref 11.4–15.2)

## 2019-09-18 ENCOUNTER — Other Ambulatory Visit
Admission: RE | Admit: 2019-09-18 | Discharge: 2019-09-18 | Disposition: A | Discharge status: Home / Self Care | Payer: Medicare PPO | Source: Ambulatory Visit | Attending: Internal Medicine | Admitting: Internal Medicine

## 2019-09-18 DIAGNOSIS — I480 Paroxysmal atrial fibrillation: Secondary | ICD-10-CM | POA: Insufficient documentation

## 2019-09-18 DIAGNOSIS — Z952 Presence of prosthetic heart valve: Secondary | ICD-10-CM | POA: Diagnosis not present

## 2019-09-18 DIAGNOSIS — Z5181 Encounter for therapeutic drug level monitoring: Secondary | ICD-10-CM | POA: Diagnosis not present

## 2019-09-18 DIAGNOSIS — Z7901 Long term (current) use of anticoagulants: Secondary | ICD-10-CM | POA: Insufficient documentation

## 2019-09-18 LAB — PROTIME-INR
INR: 2.1 — ABNORMAL HIGH (ref 0.8–1.2)
Prothrombin Time: 23.2 seconds — ABNORMAL HIGH (ref 11.4–15.2)

## 2019-09-25 ENCOUNTER — Other Ambulatory Visit
Admission: RE | Admit: 2019-09-25 | Discharge: 2019-09-25 | Disposition: A | Discharge status: Home / Self Care | Payer: Medicare PPO | Source: Ambulatory Visit | Attending: Internal Medicine | Admitting: Internal Medicine

## 2019-09-25 DIAGNOSIS — I480 Paroxysmal atrial fibrillation: Secondary | ICD-10-CM | POA: Insufficient documentation

## 2019-09-25 LAB — PROTIME-INR
INR: 2 — ABNORMAL HIGH (ref 0.8–1.2)
Prothrombin Time: 22.5 seconds — ABNORMAL HIGH (ref 11.4–15.2)

## 2019-10-01 DIAGNOSIS — F0390 Unspecified dementia without behavioral disturbance: Secondary | ICD-10-CM | POA: Diagnosis not present

## 2019-10-19 ENCOUNTER — Encounter
Admission: RE | Admit: 2019-10-19 | Discharge: 2019-10-19 | Disposition: A | Discharge status: Home / Self Care | Payer: Medicare PPO | Source: Ambulatory Visit | Attending: Internal Medicine | Admitting: Internal Medicine

## 2019-10-26 ENCOUNTER — Other Ambulatory Visit
Admission: RE | Admit: 2019-10-26 | Discharge: 2019-10-26 | Disposition: A | Discharge status: Home / Self Care | Payer: Medicare PPO | Source: Ambulatory Visit | Attending: Internal Medicine | Admitting: Internal Medicine

## 2019-10-26 DIAGNOSIS — Z7901 Long term (current) use of anticoagulants: Secondary | ICD-10-CM | POA: Insufficient documentation

## 2019-10-26 LAB — PROTIME-INR
INR: 1.8 — ABNORMAL HIGH (ref 0.8–1.2)
Prothrombin Time: 20.9 seconds — ABNORMAL HIGH (ref 11.4–15.2)

## 2019-11-25 ENCOUNTER — Other Ambulatory Visit
Admission: RE | Admit: 2019-11-25 | Discharge: 2019-11-25 | Disposition: A | Discharge status: Home / Self Care | Payer: Medicare PPO | Source: Ambulatory Visit | Attending: Internal Medicine | Admitting: Internal Medicine

## 2019-11-25 DIAGNOSIS — Z7901 Long term (current) use of anticoagulants: Secondary | ICD-10-CM | POA: Diagnosis not present

## 2019-11-25 DIAGNOSIS — Z20828 Contact with and (suspected) exposure to other viral communicable diseases: Secondary | ICD-10-CM | POA: Diagnosis not present

## 2019-11-25 LAB — PROTIME-INR
INR: 2.5 — ABNORMAL HIGH (ref 0.8–1.2)
Prothrombin Time: 26.8 seconds — ABNORMAL HIGH (ref 11.4–15.2)

## 2019-11-30 DIAGNOSIS — Z20828 Contact with and (suspected) exposure to other viral communicable diseases: Secondary | ICD-10-CM | POA: Diagnosis not present

## 2019-12-07 DIAGNOSIS — Z20828 Contact with and (suspected) exposure to other viral communicable diseases: Secondary | ICD-10-CM | POA: Diagnosis not present

## 2019-12-21 DIAGNOSIS — Z20828 Contact with and (suspected) exposure to other viral communicable diseases: Secondary | ICD-10-CM | POA: Diagnosis not present

## 2019-12-28 ENCOUNTER — Other Ambulatory Visit
Admission: RE | Admit: 2019-12-28 | Discharge: 2019-12-28 | Disposition: A | Discharge status: Home / Self Care | Payer: Medicare PPO | Source: Ambulatory Visit | Attending: Internal Medicine | Admitting: Internal Medicine

## 2019-12-28 DIAGNOSIS — Z20828 Contact with and (suspected) exposure to other viral communicable diseases: Secondary | ICD-10-CM | POA: Diagnosis not present

## 2019-12-28 LAB — FIBRIN DERIVATIVES D-DIMER (ARMC ONLY): Fibrin derivatives D-dimer (ARMC): 535.88 ng/mL (FEU) — ABNORMAL HIGH (ref 0.00–499.00)

## 2019-12-30 ENCOUNTER — Other Ambulatory Visit
Admission: RE | Admit: 2019-12-30 | Discharge: 2019-12-30 | Disposition: A | Discharge status: Home / Self Care | Payer: Medicare PPO | Source: Ambulatory Visit | Attending: Internal Medicine | Admitting: Internal Medicine

## 2019-12-30 DIAGNOSIS — I480 Paroxysmal atrial fibrillation: Secondary | ICD-10-CM | POA: Diagnosis not present

## 2019-12-30 LAB — PROTIME-INR
INR: 2.4 — ABNORMAL HIGH (ref 0.8–1.2)
Prothrombin Time: 26.4 seconds — ABNORMAL HIGH (ref 11.4–15.2)

## 2020-01-07 DIAGNOSIS — Z20828 Contact with and (suspected) exposure to other viral communicable diseases: Secondary | ICD-10-CM | POA: Diagnosis not present

## 2020-01-11 DIAGNOSIS — Z20828 Contact with and (suspected) exposure to other viral communicable diseases: Secondary | ICD-10-CM | POA: Diagnosis not present

## 2020-01-18 DIAGNOSIS — Z20828 Contact with and (suspected) exposure to other viral communicable diseases: Secondary | ICD-10-CM | POA: Diagnosis not present

## 2020-01-28 ENCOUNTER — Encounter
Admission: RE | Admit: 2020-01-28 | Discharge: 2020-01-28 | Disposition: A | Discharge status: Home / Self Care | Payer: Medicare PPO | Source: Ambulatory Visit | Attending: Internal Medicine | Admitting: Internal Medicine

## 2020-01-28 ENCOUNTER — Other Ambulatory Visit
Admission: RE | Admit: 2020-01-28 | Discharge: 2020-01-28 | Disposition: A | Discharge status: Home / Self Care | Payer: Medicare PPO | Source: Skilled Nursing Facility | Attending: Internal Medicine | Admitting: Internal Medicine

## 2020-01-28 DIAGNOSIS — I5022 Chronic systolic (congestive) heart failure: Secondary | ICD-10-CM | POA: Diagnosis not present

## 2020-01-28 LAB — PROTIME-INR
INR: 3.3 — ABNORMAL HIGH (ref 0.8–1.2)
Prothrombin Time: 33.6 seconds — ABNORMAL HIGH (ref 11.4–15.2)

## 2020-02-26 ENCOUNTER — Other Ambulatory Visit
Admission: RE | Admit: 2020-02-26 | Discharge: 2020-02-26 | Disposition: A | Discharge status: Home / Self Care | Payer: Medicare PPO | Source: Ambulatory Visit | Attending: Internal Medicine | Admitting: Internal Medicine

## 2020-02-26 DIAGNOSIS — I5022 Chronic systolic (congestive) heart failure: Secondary | ICD-10-CM | POA: Insufficient documentation

## 2020-02-26 LAB — PROTIME-INR
INR: 3.3 — ABNORMAL HIGH (ref 0.8–1.2)
Prothrombin Time: 33.4 seconds — ABNORMAL HIGH (ref 11.4–15.2)

## 2020-03-08 DIAGNOSIS — Z593 Problems related to living in residential institution: Secondary | ICD-10-CM | POA: Diagnosis not present

## 2020-03-08 DIAGNOSIS — I10 Essential (primary) hypertension: Secondary | ICD-10-CM | POA: Diagnosis not present

## 2020-03-08 DIAGNOSIS — I480 Paroxysmal atrial fibrillation: Secondary | ICD-10-CM | POA: Diagnosis not present

## 2020-03-08 DIAGNOSIS — F0390 Unspecified dementia without behavioral disturbance: Secondary | ICD-10-CM | POA: Diagnosis not present

## 2020-03-08 DIAGNOSIS — Z0000 Encounter for general adult medical examination without abnormal findings: Secondary | ICD-10-CM | POA: Diagnosis not present

## 2020-03-15 ENCOUNTER — Other Ambulatory Visit
Admission: RE | Admit: 2020-03-15 | Discharge: 2020-03-15 | Disposition: A | Discharge status: Home / Self Care | Payer: Medicare PPO | Source: Ambulatory Visit | Attending: Internal Medicine | Admitting: Internal Medicine

## 2020-03-15 DIAGNOSIS — I480 Paroxysmal atrial fibrillation: Secondary | ICD-10-CM | POA: Diagnosis not present

## 2020-03-15 LAB — PROTIME-INR
INR: 3.7 — ABNORMAL HIGH (ref 0.8–1.2)
Prothrombin Time: 36.5 seconds — ABNORMAL HIGH (ref 11.4–15.2)

## 2020-03-22 DIAGNOSIS — I495 Sick sinus syndrome: Secondary | ICD-10-CM | POA: Diagnosis not present

## 2020-03-28 ENCOUNTER — Other Ambulatory Visit
Admission: RE | Admit: 2020-03-28 | Discharge: 2020-03-28 | Disposition: A | Discharge status: Home / Self Care | Payer: Medicare PPO | Source: Skilled Nursing Facility | Attending: Internal Medicine | Admitting: Internal Medicine

## 2020-03-28 DIAGNOSIS — I480 Paroxysmal atrial fibrillation: Secondary | ICD-10-CM | POA: Insufficient documentation

## 2020-03-28 LAB — PROTIME-INR
INR: 2.9 — ABNORMAL HIGH (ref 0.8–1.2)
Prothrombin Time: 30.6 seconds — ABNORMAL HIGH (ref 11.4–15.2)

## 2020-04-11 ENCOUNTER — Encounter
Admission: RE | Admit: 2020-04-11 | Discharge: 2020-04-11 | Disposition: A | Discharge status: Home / Self Care | Payer: Medicare PPO | Source: Ambulatory Visit | Attending: Internal Medicine | Admitting: Internal Medicine

## 2020-04-27 ENCOUNTER — Other Ambulatory Visit
Admission: RE | Admit: 2020-04-27 | Discharge: 2020-04-27 | Disposition: A | Discharge status: Home / Self Care | Payer: Medicare PPO | Source: Ambulatory Visit | Attending: Internal Medicine | Admitting: Internal Medicine

## 2020-04-27 DIAGNOSIS — I480 Paroxysmal atrial fibrillation: Secondary | ICD-10-CM | POA: Diagnosis not present

## 2020-04-27 LAB — PROTIME-INR
INR: 2.8 — ABNORMAL HIGH (ref 0.8–1.2)
Prothrombin Time: 28.3 seconds — ABNORMAL HIGH (ref 11.4–15.2)

## 2020-05-26 ENCOUNTER — Other Ambulatory Visit
Admission: RE | Admit: 2020-05-26 | Discharge: 2020-05-26 | Disposition: A | Discharge status: Home / Self Care | Payer: Medicare PPO | Source: Ambulatory Visit | Attending: Internal Medicine | Admitting: Internal Medicine

## 2020-05-26 DIAGNOSIS — I480 Paroxysmal atrial fibrillation: Secondary | ICD-10-CM | POA: Insufficient documentation

## 2020-05-26 LAB — PROTIME-INR
INR: 2.5 — ABNORMAL HIGH (ref 0.8–1.2)
Prothrombin Time: 25.9 seconds — ABNORMAL HIGH (ref 11.4–15.2)

## 2020-06-27 ENCOUNTER — Other Ambulatory Visit
Admission: RE | Admit: 2020-06-27 | Discharge: 2020-06-27 | Disposition: A | Discharge status: Home / Self Care | Payer: Medicare PPO | Source: Ambulatory Visit | Attending: Internal Medicine | Admitting: Internal Medicine

## 2020-06-27 DIAGNOSIS — I5022 Chronic systolic (congestive) heart failure: Secondary | ICD-10-CM | POA: Diagnosis present

## 2020-06-27 LAB — PROTIME-INR
INR: 3.2 — ABNORMAL HIGH (ref 0.8–1.2)
Prothrombin Time: 31.7 seconds — ABNORMAL HIGH (ref 11.4–15.2)

## 2020-07-28 ENCOUNTER — Other Ambulatory Visit
Admission: RE | Admit: 2020-07-28 | Discharge: 2020-07-28 | Disposition: A | Discharge status: Home / Self Care | Payer: Medicare PPO | Source: Ambulatory Visit | Attending: Internal Medicine | Admitting: Internal Medicine

## 2020-07-28 DIAGNOSIS — I5022 Chronic systolic (congestive) heart failure: Secondary | ICD-10-CM | POA: Diagnosis present

## 2020-07-28 LAB — PROTIME-INR
INR: 2.8 — ABNORMAL HIGH (ref 0.8–1.2)
Prothrombin Time: 28.5 seconds — ABNORMAL HIGH (ref 11.4–15.2)

## 2020-08-11 ENCOUNTER — Other Ambulatory Visit
Admission: RE | Admit: 2020-08-11 | Discharge: 2020-08-11 | Disposition: A | Discharge status: Home / Self Care | Payer: Medicare PPO | Source: Ambulatory Visit | Attending: Internal Medicine | Admitting: Internal Medicine

## 2020-08-11 DIAGNOSIS — N390 Urinary tract infection, site not specified: Secondary | ICD-10-CM | POA: Diagnosis present

## 2020-08-11 LAB — URINALYSIS, ROUTINE W REFLEX MICROSCOPIC
Bilirubin Urine: NEGATIVE
Glucose, UA: NEGATIVE mg/dL
Hgb urine dipstick: NEGATIVE
Ketones, ur: NEGATIVE mg/dL
Leukocytes,Ua: NEGATIVE
Nitrite: NEGATIVE
Protein, ur: NEGATIVE mg/dL
Specific Gravity, Urine: 1.011 (ref 1.005–1.030)
pH: 7 (ref 5.0–8.0)

## 2020-08-12 ENCOUNTER — Other Ambulatory Visit: Payer: Self-pay | Admitting: Orthopedic Surgery

## 2020-08-12 ENCOUNTER — Other Ambulatory Visit (HOSPITAL_COMMUNITY): Payer: Self-pay | Admitting: Orthopedic Surgery

## 2020-08-12 DIAGNOSIS — M545 Low back pain, unspecified: Secondary | ICD-10-CM

## 2020-08-13 LAB — URINE CULTURE: Culture: 30000 — AB

## 2020-08-19 ENCOUNTER — Other Ambulatory Visit
Admission: RE | Admit: 2020-08-19 | Discharge: 2020-08-19 | Disposition: A | Discharge status: Home / Self Care | Payer: Medicare PPO | Source: Ambulatory Visit | Attending: Internal Medicine | Admitting: Internal Medicine

## 2020-08-19 DIAGNOSIS — I4891 Unspecified atrial fibrillation: Secondary | ICD-10-CM | POA: Insufficient documentation

## 2020-08-19 LAB — COMPREHENSIVE METABOLIC PANEL
ALT: 14 U/L (ref 0–44)
AST: 21 U/L (ref 15–41)
Albumin: 3.6 g/dL (ref 3.5–5.0)
Alkaline Phosphatase: 62 U/L (ref 38–126)
Anion gap: 8 (ref 5–15)
BUN: 34 mg/dL — ABNORMAL HIGH (ref 8–23)
CO2: 25 mmol/L (ref 22–32)
Calcium: 8.7 mg/dL — ABNORMAL LOW (ref 8.9–10.3)
Chloride: 103 mmol/L (ref 98–111)
Creatinine, Ser: 0.81 mg/dL (ref 0.61–1.24)
GFR calc Af Amer: >60 mL/min (ref >60)
GFR calc non Af Amer: >60 mL/min (ref >60)
Glucose, Bld: 106 mg/dL — ABNORMAL HIGH (ref 70–99)
Potassium: 3.4 mmol/L — ABNORMAL LOW (ref 3.5–5.1)
Sodium: 136 mmol/L (ref 135–145)
Total Bilirubin: 1.3 mg/dL — ABNORMAL HIGH (ref 0.3–1.2)
Total Protein: 5.7 g/dL — ABNORMAL LOW (ref 6.5–8.1)

## 2020-08-19 LAB — PROTIME-INR
INR: 3.1 — ABNORMAL HIGH (ref 0.8–1.2)
Prothrombin Time: 30.6 seconds — ABNORMAL HIGH (ref 11.4–15.2)

## 2020-08-19 LAB — CBC
HCT: 30.1 % — ABNORMAL LOW (ref 39.0–52.0)
Hemoglobin: 10.6 g/dL — ABNORMAL LOW (ref 13.0–17.0)
MCH: 34.9 pg — ABNORMAL HIGH (ref 26.0–34.0)
MCHC: 35.2 g/dL (ref 30.0–36.0)
MCV: 99 fL (ref 80.0–100.0)
Platelets: 159 10*3/uL (ref 150–400)
RBC: 3.04 MIL/uL — ABNORMAL LOW (ref 4.22–5.81)
RDW: 13.6 % (ref 11.5–15.5)
WBC: 8.4 10*3/uL (ref 4.0–10.5)
nRBC: 0 % (ref 0.0–0.2)

## 2020-08-26 ENCOUNTER — Other Ambulatory Visit
Admission: RE | Admit: 2020-08-26 | Discharge: 2020-08-26 | Disposition: A | Discharge status: Home / Self Care | Payer: Medicare PPO | Source: Ambulatory Visit | Attending: Internal Medicine | Admitting: Internal Medicine

## 2020-08-26 DIAGNOSIS — I4891 Unspecified atrial fibrillation: Secondary | ICD-10-CM | POA: Insufficient documentation

## 2020-08-26 LAB — PROTIME-INR
INR: 2.5 — ABNORMAL HIGH (ref 0.8–1.2)
Prothrombin Time: 26.3 seconds — ABNORMAL HIGH (ref 11.4–15.2)

## 2020-09-05 ENCOUNTER — Other Ambulatory Visit: Payer: Self-pay

## 2020-09-05 ENCOUNTER — Ambulatory Visit (HOSPITAL_COMMUNITY)
Admission: RE | Admit: 2020-09-05 | Discharge: 2020-09-05 | Disposition: A | Discharge status: Home / Self Care | Payer: Medicare PPO | Source: Ambulatory Visit | Attending: Orthopedic Surgery | Admitting: Orthopedic Surgery

## 2020-09-05 ENCOUNTER — Ambulatory Visit (HOSPITAL_COMMUNITY)
Admission: RE | Admit: 2020-09-05 | Discharge: 2020-09-05 | Disposition: A | Discharge status: Home / Self Care | Payer: Medicare PPO | Source: Ambulatory Visit | Attending: Physician Assistant | Admitting: Physician Assistant

## 2020-09-05 DIAGNOSIS — M545 Low back pain, unspecified: Secondary | ICD-10-CM

## 2020-09-05 DIAGNOSIS — Z950 Presence of cardiac pacemaker: Secondary | ICD-10-CM | POA: Diagnosis present

## 2020-09-05 NOTE — Progress Notes (Signed)
Orders received for VOO at 80

## 2020-09-05 NOTE — Progress Notes (Signed)
Patient re-programmed to original settings. [employer] express sent. Patient taken out via wheelchair

## 2020-09-26 ENCOUNTER — Other Ambulatory Visit
Admission: RE | Admit: 2020-09-26 | Discharge: 2020-09-26 | Disposition: A | Discharge status: Home / Self Care | Payer: Medicare PPO | Source: Ambulatory Visit | Attending: Internal Medicine | Admitting: Internal Medicine

## 2020-09-26 DIAGNOSIS — I4891 Unspecified atrial fibrillation: Secondary | ICD-10-CM | POA: Insufficient documentation

## 2020-09-26 LAB — PROTIME-INR
INR: 3 — ABNORMAL HIGH (ref 0.8–1.2)
Prothrombin Time: 30.3 seconds — ABNORMAL HIGH (ref 11.4–15.2)

## 2020-10-20 ENCOUNTER — Encounter
Admission: RE | Admit: 2020-10-20 | Discharge: 2020-10-20 | Disposition: A | Discharge status: Home / Self Care | Payer: Medicare PPO | Source: Ambulatory Visit | Attending: Internal Medicine | Admitting: Internal Medicine

## 2020-10-26 ENCOUNTER — Other Ambulatory Visit
Admission: RE | Admit: 2020-10-26 | Discharge: 2020-10-26 | Disposition: A | Discharge status: Home / Self Care | Payer: Medicare PPO | Source: Ambulatory Visit | Attending: Internal Medicine | Admitting: Internal Medicine

## 2020-10-26 DIAGNOSIS — I4891 Unspecified atrial fibrillation: Secondary | ICD-10-CM | POA: Diagnosis present

## 2020-10-26 LAB — PROTIME-INR
INR: 1.3 — ABNORMAL HIGH (ref 0.8–1.2)
Prothrombin Time: 16.1 seconds — ABNORMAL HIGH (ref 11.4–15.2)

## 2020-11-09 ENCOUNTER — Other Ambulatory Visit
Admission: RE | Admit: 2020-11-09 | Discharge: 2020-11-09 | Disposition: A | Discharge status: Home / Self Care | Payer: Medicare PPO | Source: Ambulatory Visit | Attending: Internal Medicine | Admitting: Internal Medicine

## 2020-11-09 DIAGNOSIS — I4891 Unspecified atrial fibrillation: Secondary | ICD-10-CM | POA: Insufficient documentation

## 2020-11-09 LAB — PROTIME-INR
INR: 1.9 — ABNORMAL HIGH (ref 0.8–1.2)
Prothrombin Time: 21 seconds — ABNORMAL HIGH (ref 11.4–15.2)

## 2020-11-23 ENCOUNTER — Other Ambulatory Visit
Admission: RE | Admit: 2020-11-23 | Discharge: 2020-11-23 | Disposition: A | Discharge status: Home / Self Care | Payer: Medicare PPO | Source: Ambulatory Visit | Attending: Internal Medicine | Admitting: Internal Medicine

## 2020-11-23 DIAGNOSIS — I4891 Unspecified atrial fibrillation: Secondary | ICD-10-CM | POA: Insufficient documentation

## 2020-11-23 LAB — PROTIME-INR
INR: 1.8 — ABNORMAL HIGH (ref 0.8–1.2)
Prothrombin Time: 20.6 seconds — ABNORMAL HIGH (ref 11.4–15.2)

## 2020-12-21 ENCOUNTER — Other Ambulatory Visit
Admission: RE | Admit: 2020-12-21 | Discharge: 2020-12-21 | Disposition: A | Discharge status: Home / Self Care | Payer: Medicare PPO | Source: Skilled Nursing Facility | Attending: Internal Medicine | Admitting: Internal Medicine

## 2020-12-21 DIAGNOSIS — I4891 Unspecified atrial fibrillation: Secondary | ICD-10-CM | POA: Diagnosis present

## 2020-12-21 LAB — PROTIME-INR
INR: 1.6 — ABNORMAL HIGH (ref 0.8–1.2)
Prothrombin Time: 18.5 seconds — ABNORMAL HIGH (ref 11.4–15.2)

## 2020-12-28 ENCOUNTER — Other Ambulatory Visit
Admission: RE | Admit: 2020-12-28 | Discharge: 2020-12-28 | Disposition: A | Discharge status: Home / Self Care | Payer: Medicare PPO | Source: Ambulatory Visit | Attending: Internal Medicine | Admitting: Internal Medicine

## 2020-12-28 DIAGNOSIS — F0390 Unspecified dementia without behavioral disturbance: Secondary | ICD-10-CM | POA: Diagnosis present

## 2020-12-28 DIAGNOSIS — Z955 Presence of coronary angioplasty implant and graft: Secondary | ICD-10-CM | POA: Diagnosis not present

## 2020-12-28 DIAGNOSIS — Z952 Presence of prosthetic heart valve: Secondary | ICD-10-CM | POA: Insufficient documentation

## 2020-12-28 DIAGNOSIS — Z79899 Other long term (current) drug therapy: Secondary | ICD-10-CM | POA: Diagnosis not present

## 2020-12-28 DIAGNOSIS — I10 Essential (primary) hypertension: Secondary | ICD-10-CM | POA: Diagnosis not present

## 2020-12-28 DIAGNOSIS — I480 Paroxysmal atrial fibrillation: Secondary | ICD-10-CM | POA: Diagnosis not present

## 2020-12-28 LAB — PROTIME-INR
INR: 2.5 — ABNORMAL HIGH (ref 0.8–1.2)
Prothrombin Time: 26.3 seconds — ABNORMAL HIGH (ref 11.4–15.2)

## 2021-01-11 ENCOUNTER — Other Ambulatory Visit
Admission: RE | Admit: 2021-01-11 | Discharge: 2021-01-11 | Disposition: A | Discharge status: Home / Self Care | Payer: Medicare PPO | Source: Skilled Nursing Facility | Attending: Internal Medicine | Admitting: Internal Medicine

## 2021-01-11 DIAGNOSIS — I480 Paroxysmal atrial fibrillation: Secondary | ICD-10-CM | POA: Insufficient documentation

## 2021-01-11 LAB — PROTIME-INR
INR: 2.5 — ABNORMAL HIGH (ref 0.8–1.2)
Prothrombin Time: 25.8 seconds — ABNORMAL HIGH (ref 11.4–15.2)

## 2021-02-08 ENCOUNTER — Other Ambulatory Visit
Admission: RE | Admit: 2021-02-08 | Discharge: 2021-02-08 | Disposition: A | Discharge status: Home / Self Care | Payer: Medicare PPO | Source: Ambulatory Visit | Attending: Internal Medicine | Admitting: Internal Medicine

## 2021-02-08 DIAGNOSIS — I4891 Unspecified atrial fibrillation: Secondary | ICD-10-CM | POA: Diagnosis present

## 2021-02-08 LAB — PROTIME-INR
INR: 3.4 — ABNORMAL HIGH (ref 0.8–1.2)
Prothrombin Time: 33.2 seconds — ABNORMAL HIGH (ref 11.4–15.2)

## 2021-03-15 ENCOUNTER — Other Ambulatory Visit
Admission: RE | Admit: 2021-03-15 | Discharge: 2021-03-15 | Disposition: A | Discharge status: Home / Self Care | Payer: Medicare PPO | Source: Ambulatory Visit | Attending: Internal Medicine | Admitting: Internal Medicine

## 2021-03-15 DIAGNOSIS — I4891 Unspecified atrial fibrillation: Secondary | ICD-10-CM | POA: Diagnosis present

## 2021-03-15 LAB — PROTIME-INR
INR: 2.8 — ABNORMAL HIGH (ref 0.8–1.2)
Prothrombin Time: 28.8 seconds — ABNORMAL HIGH (ref 11.4–15.2)

## 2021-04-12 ENCOUNTER — Other Ambulatory Visit
Admission: RE | Admit: 2021-04-12 | Discharge: 2021-04-12 | Disposition: A | Discharge status: Home / Self Care | Payer: Medicare PPO | Source: Ambulatory Visit | Attending: Internal Medicine | Admitting: Internal Medicine

## 2021-04-12 DIAGNOSIS — I4891 Unspecified atrial fibrillation: Secondary | ICD-10-CM | POA: Diagnosis present

## 2021-04-12 LAB — PROTIME-INR
INR: 3.2 — ABNORMAL HIGH (ref 0.8–1.2)
Prothrombin Time: 32.7 seconds — ABNORMAL HIGH (ref 11.4–15.2)

## 2021-05-17 ENCOUNTER — Other Ambulatory Visit
Admission: RE | Admit: 2021-05-17 | Discharge: 2021-05-17 | Disposition: A | Discharge status: Home / Self Care | Payer: Medicare PPO | Source: Ambulatory Visit | Attending: Internal Medicine | Admitting: Internal Medicine

## 2021-05-17 DIAGNOSIS — I4891 Unspecified atrial fibrillation: Secondary | ICD-10-CM | POA: Insufficient documentation

## 2021-05-17 LAB — PROTIME-INR
INR: 3.5 — ABNORMAL HIGH (ref 0.8–1.2)
Prothrombin Time: 35.1 seconds — ABNORMAL HIGH (ref 11.4–15.2)

## 2021-06-08 IMAGING — CR DG CHEST 1V
1 series · 1 of 1 positions shown · non-contrast
Comparison: 12/22/2014

CLINICAL DATA: Pre MRI

EXAM:
CHEST  1 VIEW

[chest ap]
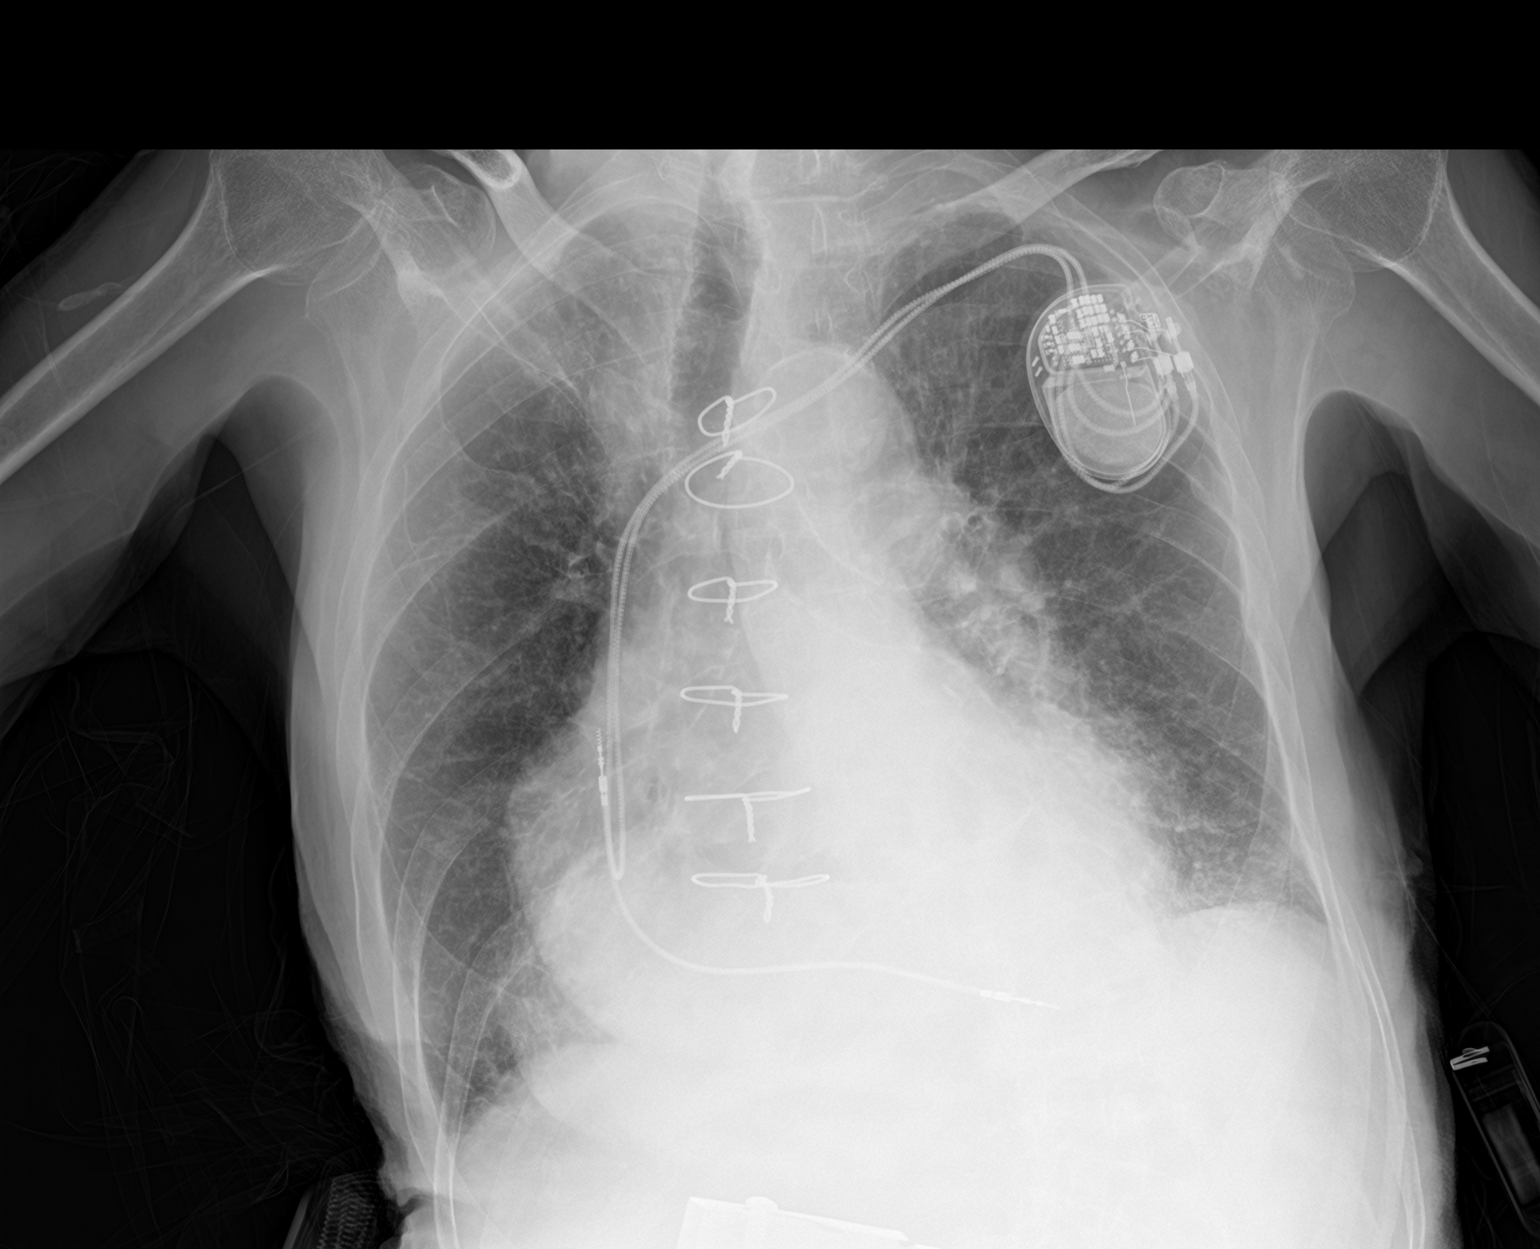

[1 of 1 positions shown; findings below may reference images not displayed]

FINDINGS: Cardiomegaly. Left chest multi lead pacer. Both lungs are clear. The
visualized skeletal structures are unremarkable.
IMPRESSION: Cardiomegaly without acute abnormality of the lungs in AP portable
projection.

## 2021-06-30 ENCOUNTER — Emergency Department: Payer: Medicare PPO

## 2021-06-30 ENCOUNTER — Other Ambulatory Visit: Payer: Self-pay

## 2021-06-30 ENCOUNTER — Emergency Department
Admission: EM | Admit: 2021-06-30 | Discharge: 2021-06-30 | Disposition: A | Discharge status: Home / Self Care | Payer: Medicare PPO | Attending: Emergency Medicine | Admitting: Emergency Medicine

## 2021-06-30 DIAGNOSIS — I25118 Atherosclerotic heart disease of native coronary artery with other forms of angina pectoris: Secondary | ICD-10-CM | POA: Diagnosis not present

## 2021-06-30 DIAGNOSIS — K5900 Constipation, unspecified: Secondary | ICD-10-CM | POA: Insufficient documentation

## 2021-06-30 DIAGNOSIS — Z950 Presence of cardiac pacemaker: Secondary | ICD-10-CM | POA: Diagnosis not present

## 2021-06-30 DIAGNOSIS — I5022 Chronic systolic (congestive) heart failure: Secondary | ICD-10-CM | POA: Insufficient documentation

## 2021-06-30 DIAGNOSIS — Z85828 Personal history of other malignant neoplasm of skin: Secondary | ICD-10-CM | POA: Insufficient documentation

## 2021-06-30 DIAGNOSIS — Z20822 Contact with and (suspected) exposure to covid-19: Secondary | ICD-10-CM | POA: Diagnosis not present

## 2021-06-30 DIAGNOSIS — I110 Hypertensive heart disease with heart failure: Secondary | ICD-10-CM | POA: Diagnosis not present

## 2021-06-30 DIAGNOSIS — K449 Diaphragmatic hernia without obstruction or gangrene: Secondary | ICD-10-CM | POA: Insufficient documentation

## 2021-06-30 DIAGNOSIS — M4854XA Collapsed vertebra, not elsewhere classified, thoracic region, initial encounter for fracture: Secondary | ICD-10-CM | POA: Diagnosis not present

## 2021-06-30 DIAGNOSIS — Z79899 Other long term (current) drug therapy: Secondary | ICD-10-CM | POA: Diagnosis not present

## 2021-06-30 DIAGNOSIS — Z951 Presence of aortocoronary bypass graft: Secondary | ICD-10-CM | POA: Diagnosis not present

## 2021-06-30 DIAGNOSIS — R112 Nausea with vomiting, unspecified: Secondary | ICD-10-CM | POA: Insufficient documentation

## 2021-06-30 DIAGNOSIS — F0390 Unspecified dementia without behavioral disturbance: Secondary | ICD-10-CM | POA: Diagnosis not present

## 2021-06-30 DIAGNOSIS — Z7901 Long term (current) use of anticoagulants: Secondary | ICD-10-CM | POA: Diagnosis not present

## 2021-06-30 DIAGNOSIS — I4891 Unspecified atrial fibrillation: Secondary | ICD-10-CM | POA: Diagnosis not present

## 2021-06-30 LAB — COMPREHENSIVE METABOLIC PANEL
ALT: 17 U/L (ref 0–44)
AST: 30 U/L (ref 15–41)
Albumin: 4.1 g/dL (ref 3.5–5.0)
Alkaline Phosphatase: 61 U/L (ref 38–126)
Anion gap: 11 (ref 5–15)
BUN: 30 mg/dL — ABNORMAL HIGH (ref 8–23)
CO2: 26 mmol/L (ref 22–32)
Calcium: 9.6 mg/dL (ref 8.9–10.3)
Chloride: 103 mmol/L (ref 98–111)
Creatinine, Ser: 0.93 mg/dL (ref 0.61–1.24)
GFR, Estimated: >60 mL/min (ref >60)
Glucose, Bld: 181 mg/dL — ABNORMAL HIGH (ref 70–99)
Potassium: 3.5 mmol/L (ref 3.5–5.1)
Sodium: 140 mmol/L (ref 135–145)
Total Bilirubin: 0.9 mg/dL (ref 0.3–1.2)
Total Protein: 7 g/dL (ref 6.5–8.1)

## 2021-06-30 LAB — CBC WITH DIFFERENTIAL/PLATELET
Abs Immature Granulocytes: 0.04 10*3/uL (ref 0.00–0.07)
Basophils Absolute: 0 10*3/uL (ref 0.0–0.1)
Basophils Relative: 0 %
Eosinophils Absolute: 0 10*3/uL (ref 0.0–0.5)
Eosinophils Relative: 0 %
HCT: 35.6 % — ABNORMAL LOW (ref 39.0–52.0)
Hemoglobin: 12 g/dL — ABNORMAL LOW (ref 13.0–17.0)
Immature Granulocytes: 1 %
Lymphocytes Relative: 7 %
Lymphs Abs: 0.5 10*3/uL — ABNORMAL LOW (ref 0.7–4.0)
MCH: 34.7 pg — ABNORMAL HIGH (ref 26.0–34.0)
MCHC: 33.7 g/dL (ref 30.0–36.0)
MCV: 102.9 fL — ABNORMAL HIGH (ref 80.0–100.0)
Monocytes Absolute: 0.5 10*3/uL (ref 0.1–1.0)
Monocytes Relative: 7 %
Neutro Abs: 6.6 10*3/uL (ref 1.7–7.7)
Neutrophils Relative %: 85 %
Platelets: 168 10*3/uL (ref 150–400)
RBC: 3.46 MIL/uL — ABNORMAL LOW (ref 4.22–5.81)
RDW: 12.8 % (ref 11.5–15.5)
WBC: 7.7 10*3/uL (ref 4.0–10.5)
nRBC: 0 % (ref 0.0–0.2)

## 2021-06-30 LAB — RESP PANEL BY RT-PCR (FLU A&B, COVID) ARPGX2
Influenza A by PCR: NEGATIVE
Influenza B by PCR: NEGATIVE
SARS Coronavirus 2 by RT PCR: NEGATIVE

## 2021-06-30 LAB — LIPASE, BLOOD: Lipase: 65 U/L — ABNORMAL HIGH (ref 11–51)

## 2021-06-30 LAB — PROTIME-INR
INR: 1.9 — ABNORMAL HIGH (ref 0.8–1.2)
Prothrombin Time: 21.5 seconds — ABNORMAL HIGH (ref 11.4–15.2)

## 2021-06-30 MED ORDER — POLYETHYLENE GLYCOL 3350 17 G PO PACK: 17.0000 g | PACK | Freq: Every day | ORAL | 0 refills | Status: AC

## 2021-06-30 MED ORDER — SODIUM CHLORIDE 0.9 % IV BOLUS
500.0000 mL | Freq: Once | INTRAVENOUS | Status: AC
  Administered 2021-06-30: 500 mL via INTRAVENOUS

## 2021-06-30 MED ORDER — IOHEXOL 300 MG/ML  SOLN
75.0000 mL | Freq: Once | INTRAMUSCULAR | Status: AC | PRN
  Administered 2021-06-30: 75 mL via INTRAVENOUS

## 2021-06-30 NOTE — ED Triage Notes (Signed)
BIB EMS from [facility] for vomiting, possible GI bleed. Vomiting started last night. DNR. Vitals stable. Dementia PT.   20G LFA. Paced rhythm. '4mg'$  of Zofran.  132/74 97%on RA

## 2021-06-30 NOTE — ED Provider Notes (Signed)
[facility] Emergency Department Provider Note  ____________________________________________  Time seen: Approximately 8:55 AM  I have reviewed the triage vital signs and the nursing notes.   HISTORY  Chief Complaint Nausea and Emesis    Level 5 Caveat: Portions of the History and Physical including HPI and review of systems are unable to be completely obtained due to patient being a poor historian   HPI Kevin [name] is a 87 y.o. male with a history of atrial fibrillation on Coumadin, CHF CAD hypertension and dementia who was brought to the ED due to vomiting.  Started vomiting last night, given Phenergan at [facility] without relief of symptoms.  Emesis was noted to be dark and there was concern for GI bleed given Coumadin use.  Patient is reported to be at his baseline mental status and without any other acute complaints.    Past Medical History:  Diagnosis Date   Atrial fibrillation ([place])    Cancer ([place])    melanoma- right ear   Cardiomyopathy, secondary ([place])    Chronic headaches started age 5   Congestive heart failure (CHF) ([place])    Coronary artery disease involving autologous vein coronary bypass graft with angina pectoris ([place]) 10/18/2018   Coronary atherosclerosis of native coronary artery    DJD (degenerative joint disease)    Essential hypertension, benign    GERD (gastroesophageal reflux disease)    Glaucoma    History of mitral valve replacement with mechanical valve    Hyperlipidemia, unspecified    Hypertension    Lumbago    Osteoporosis    secondary to low testoerone   S/P MVR (mitral valve replacement)    Sciatica    Scoliosis      Patient Active Problem List   Diagnosis Date Noted   Uncontrolled hypertension 05/25/2019   Insomnia 05/25/2019   Pacemaker [id number]   Chronic systolic heart failure ([place]) 10/18/2018   Hypertensive heart disease with systolic congestive heart failure ([place]) 10/18/2018   Coronary artery disease  involving autologous vein coronary bypass graft with angina pectoris ([place]) 10/18/2018   Chronic idiopathic constipation 10/18/2018   Chronic angle-closure glaucoma, bilateral 10/18/2018   Right leg pain 09/15/2018   Cognitive decline 06/19/2018   Protein-calorie malnutrition ([place]) 06/09/2018   Primary cancer of skin of forehead 05/27/2018   Jaw hematoma 05/14/2018   Ringworm of foot 04/15/2018   Hyponatremia 03/08/2018   Benign prostatic hyperplasia with urinary hesitancy 02/21/2018   Constipation 12/12/2017   Large hiatal hernia 12/12/2017   Encounter for preventive health examination 03/30/2017   Generalized muscle weakness 07/24/2016   Major depressive disorder, recurrent episode ([place]) 01/24/2016   Vitamin D deficiency 01/24/2016   Fatigue 01/23/2016   Benign essential HTN 03/04/2015   GERD without esophagitis 03/04/2015   H/O prosthetic heart valve 03/04/2015   Dizziness 02/21/2015   Cardiomyopathy ([place]) 03/09/2014   Hyperlipidemia 04/27/2013   Sciatica of right side 03/16/2013   Anemia, iron deficiency 09/15/2012   Osteoporosis, senile 09/15/2012   Atrial flutter ([place]) 06/26/2012   Depression 12/24/2011   Neuropathy of lower extremity 08/25/2011   Atrial fibrillation ([place])    History of mitral valve replacement with mechanical valve    Long term current use of anticoagulants with INR goal of 2.5-3.5 08/22/2011     Past Surgical History:  Procedure Laterality Date   CARDIOVERSION  2013   CATARACT EXTRACTION, BILATERAL     x 2   CORONARY ARTERY BYPASS GRAFT  12/2014   coronary artery bypass  w/arterial grafts    HERNIA REPAIR     x 2   MELANOMA EXCISION     right ear   MITRAL VALVE REPLACEMENT  1994   [place]   PERMANENT PACEMAKER INSERTION Left 12/22/2014     Prior to Admission medications   Medication Sig Start Date End Date Taking? Authorizing Provider  acetaminophen (TYLENOL) 325 MG tablet Take 650 mg by mouth every 4 (four)  hours as needed for fever. Maximum dose for 24 hours is 3000 mg from all sources of Acetaminophen/tylenol   Yes [provider]  amLODipine (NORVASC) 5 MG tablet Take 5 mg by mouth daily. 06/14/21  Yes [provider]  brimonidine (ALPHAGAN) 0.15 % ophthalmic solution Place 1 drop into both eyes 2 (two) times daily.  08/21/11  Yes [provider]  Cholecalciferol (VITAMIN D) 50 MCG (2000 UT) tablet Take 2,000 Units by mouth daily. 10/14/18  Yes [provider]  Coenzyme Q10 10 MG capsule Take 100 mg by mouth 4 (four) times daily. 10/14/18  Yes [provider]  diclofenac Sodium (VOLTAREN) 1 % GEL Apply 4 g topically 4 (four) times daily. For left knee pain 05/26/20  Yes [provider]  docusate sodium (COLACE) 100 MG capsule Take 2 capsules (200 mg total) by mouth at bedtime. 03/06/18  Yes Crecencio Mc, MD  furosemide (LASIX) 20 MG tablet Take 1 tablet (20 mg total) by mouth daily. 06/18/17  Yes Fritzi Mandes, MD  latanoprost (XALATAN) 0.005 % ophthalmic solution Place 1 drop into both eyes at bedtime.  08/13/11  Yes [provider]  lidocaine (LIDODERM) 5 % Place 1 patch onto the skin daily. Remove & Discard patch within 12 hours or as directed by MD   Yes [provider]  losartan (COZAAR) 100 MG tablet TAKE 1 TABLET BY MOUTH DAILY 12/23/18  Yes Crecencio Mc, MD  Melatonin 3 MG TABS Take 1 tablet by mouth at bedtime.  09/28/18  Yes [provider]  metoprolol tartrate (LOPRESSOR) 25 MG tablet Take 25 mg by mouth 2 (two) times daily. Hold for BP less than 100/'s or pulse less than 60 09/28/18  Yes [provider]  mirtazapine (REMERON) 15 MG tablet Take 7.5 mg by mouth at bedtime. 06/26/21  Yes [provider]  niacinamide 500 MG tablet Take 500 mg by mouth daily. 10/14/18  Yes [provider]  Nutritional Supplements (ENSURE ENLIVE PO) Take 0.08 g by mouth every other day.   Yes [provider]  omeprazole (PRILOSEC) 40 MG capsule Take 40 mg by mouth 2 (two) times daily.   Yes [provider]  tamsulosin (FLOMAX) 0.4 MG CAPS capsule TAKE 1 CAPSULE BY MOUTH EVERY DAY 08/28/18  Yes Crecencio Mc, MD  traZODone (DESYREL) 50 MG tablet Take 50 mg by mouth at bedtime. 06/18/21  Yes [provider]  warfarin (COUMADIN) 1 MG tablet Take 0.5 mg by mouth daily.   Yes [provider]  warfarin (COUMADIN) 3 MG tablet Take 3 mg by mouth daily.   Yes [provider]  ferrous sulfate 325 (65 FE) MG tablet Take 1 tablet by mouth every other day.  06/13/12   Crecencio Mc, MD  loperamide (IMODIUM) 2 MG capsule Take 2-4 mg by mouth as needed for diarrhea or loose stools. Take 2 tablets to = 4 mg for initial loose stool, take 1 tablet to = 2 mg for each subsequent  loose stool for up to 8 doses/24 hours Patient not taking: No sig reported    [provider]  meclizine (ANTIVERT) 25 MG tablet Take 25 mg by mouth 3 (three) times daily as needed for dizziness. Patient not taking: No sig reported    [provider]  NON FORMULARY Diet Type: regular    [provider]  ondansetron (ZOFRAN-ODT) 4 MG disintegrating tablet Take 4 mg by mouth every 4 (four) hours as needed for nausea or vomiting. Patient not taking: No sig reported 10/01/18   [provider]  polyethylene glycol (MIRALAX / GLYCOLAX) 17 g packet Take 17 g by mouth daily for 5 days. 06/30/21 07/05/21  Carrie Mew, MD  warfarin (COUMADIN) 4 MG tablet Take 4 mg by mouth daily. Patient not taking: No sig reported    [provider]  amitriptyline (ELAVIL) 25 MG tablet Take 1 tablet (25 mg total) by mouth at bedtime. 09/21/11 03/04/12  Crecencio Mc, MD     Allergies Cymbalta [duloxetine hcl] and Aspirin   Family History  Problem Relation Age of Onset   Cancer Father        unknown   Alzheimer's disease Brother    Arrhythmia Brother    Mental illness  Mother     Social History Social History   Tobacco Use   Smoking status: Never   Smokeless tobacco: Never  Vaping Use   Vaping Use: Never used  Substance Use Topics   Alcohol use: No   Drug use: No    Review of Systems Level 5 Caveat: Portions of the History and Physical including HPI and review of systems are unable to be completely obtained due to patient being a poor historian   Constitutional:   No known fever.  ENT:   No rhinorrhea. Cardiovascular:   No chest pain or syncope. Respiratory:   No dyspnea or cough. Gastrointestinal:   Negative for abdominal pain, positive vomiting  Musculoskeletal:   Negative for focal pain or swelling ____________________________________________   PHYSICAL EXAM:  VITAL SIGNS: ED Triage Vitals  Enc Vitals Group     BP 06/30/21 0842 139/70     Pulse Rate 06/30/21 0842 80     Resp 06/30/21 0842 16     Temp 06/30/21 0842 98.1 F (36.7 C)     Temp Source 06/30/21 0842 Axillary     SpO2 06/30/21 0842 100 %     Weight --      Height --      Head Circumference --      Peak Flow --      Pain Score 06/30/21 0843 0     Pain Loc --      Pain Edu? --      Excl. in [place]? --     Vital signs reviewed, nursing assessments reviewed.   Constitutional:   Awake and alert, not oriented. Non-toxic appearance. Eyes:   Conjunctivae are normal. EOMI. PERRL. ENT      Head:   Normocephalic and atraumatic.      Nose:   No congestion/rhinnorhea.       Mouth/Throat:   MMM, no pharyngeal erythema. No peritonsillar mass.       Neck:   No meningismus. Full ROM. Hematological/Lymphatic/Immunilogical:   No cervical lymphadenopathy. Cardiovascular:   RRR. Symmetric bilateral radial and DP pulses.  No murmurs. Cap refill less than 2 seconds. Respiratory:   Normal respiratory effort without tachypnea/retractions. Breath sounds are clear and equal bilaterally. No wheezes/rales/rhonchi. Gastrointestinal:  Soft and nontender. Non distended. There is no CVA  tenderness.  No rebound, rigidity, or guarding.  Rectal exam shows dark stool, Hemoccult negative  Musculoskeletal:   Normal range of motion in all extremities. No joint effusions.  No lower extremity tenderness.  No edema. Neurologic:   Normal speech and language.  Motor grossly intact. No acute focal neurologic deficits are appreciated.  Skin:    Skin is warm, dry and intact. No rash noted.  No petechiae, purpura, or bullae.  ____________________________________________    LABS (pertinent positives/negatives) (all labs ordered are listed, but only abnormal results are displayed) Labs Reviewed  COMPREHENSIVE METABOLIC PANEL - Abnormal; Notable for the following components:      Result Value   Glucose, Bld 181 (*)    BUN 30 (*)    All other components within normal limits  LIPASE, BLOOD - Abnormal; Notable for the following components:   Lipase 65 (*)    All other components within normal limits  CBC WITH DIFFERENTIAL/PLATELET - Abnormal; Notable for the following components:   RBC 3.46 (*)    Hemoglobin 12.0 (*)    HCT 35.6 (*)    MCV 102.9 (*)    MCH 34.7 (*)    Lymphs Abs 0.5 (*)    All other components within normal limits  PROTIME-INR - Abnormal; Notable for the following components:   Prothrombin Time 21.5 (*)    INR 1.9 (*)    All other components within normal limits  RESP PANEL BY RT-PCR (FLU A&B, COVID) [id number]  SAMPLE TO BLOOD BANK   ____________________________________________   EKG Interpreted by me Paced rhythm rate of 72.  Left axis, right bundle branch block.  Unremarkable T waves with less than 1 mm J-point elevation in the inferior leads and anterior leads.  T waves unremarkable. Compared to previous EKG November 22, 2017, no significant changes.  J-point elevation and bundle branch block present at that time ____________________________________________    RADIOLOGY  CT ABDOMEN PELVIS W CONTRAST  Result Date: 06/30/2021 CLINICAL DATA:  Nausea and  vomiting EXAM: CT ABDOMEN AND PELVIS WITH CONTRAST TECHNIQUE: Multidetector CT imaging of the abdomen and pelvis was performed using the standard protocol following bolus administration of intravenous contrast. CONTRAST:  72m OMNIPAQUE IOHEXOL 300 MG/ML  SOLN COMPARISON:  11/22/2017 FINDINGS: Lower chest: Large hiatal hernia with intrathoracic stomach. Moderate fluid distention of the stomach. There is flexion of the stomach in the lower chest with unchanged orientation from prior. Transverse colon partially herniates through the hernia, without obstruction. Pacer lead into the right ventricle.  Mitral valve replacement. Hepatobiliary: No focal liver abnormality.No evidence of biliary obstruction or stone. Pancreas: Unremarkable. Spleen: Unremarkable. Adrenals/Urinary Tract: Negative adrenals. No hydronephrosis or stone. Simple renal cysts including an 8 cm exophytic cyst on the left. Unremarkable bladder. Stomach/Bowel: Gastric findings are described above. No obstruction of the herniated transverse colon. No small bowel or large bowel obstruction. No visible bowel inflammation, including appendicitis. Left colonic diverticulosis. Vascular/Lymphatic: No acute vascular abnormality. Generalized atheromatous plaque. No mass or adenopathy. Reproductive:Unremarkable for age. Other: No ascites or pneumoperitoneum. Bilateral inguinal hernia repair. Musculoskeletal: No acute abnormalities. Advanced spinal degeneration with scoliosis. Remote T12 compression fracture, but interval in showing advanced height loss with a partially open and gas-filled fracture plane. Remote L1 compression fracture. IMPRESSION: 1. Large hiatal hernia with intrathoracic stomach. The stomach is moderately fluid distended but has a stable orientation when compared to 2018. 2. A segment of transverse colon also herniates through the esophageal  hiatus but is not obstructed. 3. Nonacute but interval T12 compression fracture with advanced height loss.  Remote L1 compression fracture. Electronically Signed   By: Monte Fantasia M.D.   On: 06/30/2021 10:52   DG Chest Portable 1 View  Result Date: 06/30/2021 CLINICAL DATA:  Vomiting. EXAM: PORTABLE CHEST 1 VIEW COMPARISON:  Chest x-ray dated September 05, 2020. FINDINGS: Unchanged left chest wall pacemaker and cardiomegaly. Chronic pulmonary vascular congestion and coarse interstitial markings. No focal consolidation, pleural effusion, or pneumothorax. No acute osseous abnormality. IMPRESSION: No active disease. Electronically Signed   By: Titus Dubin M.D.   On: 06/30/2021 09:33    ____________________________________________   PROCEDURES Procedures  ____________________________________________  DIFFERENTIAL DIAGNOSIS   Upper GI bleed, bowel obstruction, gastritis, pancreatitis, viral illness, dark emesis due to iron supplement  CLINICAL IMPRESSION / ASSESSMENT AND PLAN / ED COURSE  Medications ordered in the ED: Medications  sodium chloride 0.9 % bolus 500 mL (0 mLs Intravenous Stopped 06/30/21 1100)  iohexol (OMNIPAQUE) 300 MG/ML solution 75 mL (75 mLs Intravenous Contrast Given 06/30/21 1011)    Pertinent labs & imaging results that were available during my care of the patient were reviewed by me and considered in my medical decision making (see chart for details).   Kevin [name] was evaluated in Emergency Department on 06/30/2021 for the symptoms described in the history of present illness. He was evaluated in the context of the global COVID-19 pandemic, which necessitated consideration that the patient might be at risk for infection with the SARS-CoV-2 virus that causes COVID-19. Institutional protocols and algorithms that pertain to the evaluation of patients at risk for COVID-19 are in a state of rapid change based on information released by regulatory bodies including the CDC and federal and state organizations. These policies and algorithms were followed during the patient's  care in the ED.   Patient presents with vomiting.  Due to his age, comorbidities, dementia and limited history, and Coumadin use, will need to check labs, CT scan of the abdomen and pelvis.  Will give IV fluids for hydration.  ----------------------------------------- 12:11 PM on 06/30/2021 ----------------------------------------- Labs and CT unremarkable. Covid neg. Has hiatal hernia which is unchanged, not obstructed. Stable for DC.       ____________________________________________   FINAL CLINICAL IMPRESSION(S) / ED DIAGNOSES    Final diagnoses:  None     ED Discharge Orders          Ordered    polyethylene glycol (MIRALAX / GLYCOLAX) 17 g packet  Daily        06/30/21 1210            Portions of this note were generated with dragon dictation software. Dictation errors may occur despite best attempts at proofreading.   Carrie Mew, MD 06/30/21 1212

## 2021-06-30 NOTE — Discharge Instructions (Addendum)
Your labs, CT scan of the abdomen, and COVID test were all okay today.  You have a hiatal hernia which is unchanged from the past.  Take MiraLAX for the next 3 days to help improve bowel movement.

## 2021-06-30 NOTE — ED Notes (Signed)
Niece Chrissie Noa called for update. She is POA. Will call back with CT results. [phone]

## 2021-06-30 NOTE — ED Notes (Signed)
RN called [facility] and the will provide transportation for pt back to facility.

## 2021-08-01 ENCOUNTER — Observation Stay: Payer: Medicare PPO

## 2021-08-01 ENCOUNTER — Inpatient Hospital Stay: Payer: Medicare PPO | Admitting: Registered Nurse

## 2021-08-01 ENCOUNTER — Encounter
Admission: EM | Disposition: A | Discharge status: Skilled Nursing Facility | Payer: Medicare PPO | Source: Home / Self Care | Attending: Internal Medicine

## 2021-08-01 ENCOUNTER — Inpatient Hospital Stay
Admission: EM | Admit: 2021-08-01 | Discharge: 2021-08-04 | DRG: 987 | Disposition: A | Discharge status: Skilled Nursing Facility | Payer: Medicare PPO | Attending: Internal Medicine | Admitting: Internal Medicine

## 2021-08-01 ENCOUNTER — Other Ambulatory Visit: Payer: Self-pay

## 2021-08-01 DIAGNOSIS — I2510 Atherosclerotic heart disease of native coronary artery without angina pectoris: Secondary | ICD-10-CM | POA: Diagnosis present

## 2021-08-01 DIAGNOSIS — K922 Gastrointestinal hemorrhage, unspecified: Secondary | ICD-10-CM

## 2021-08-01 DIAGNOSIS — I110 Hypertensive heart disease with heart failure: Secondary | ICD-10-CM | POA: Diagnosis present

## 2021-08-01 DIAGNOSIS — D509 Iron deficiency anemia, unspecified: Secondary | ICD-10-CM | POA: Diagnosis present

## 2021-08-01 DIAGNOSIS — K219 Gastro-esophageal reflux disease without esophagitis: Secondary | ICD-10-CM | POA: Diagnosis present

## 2021-08-01 DIAGNOSIS — M419 Scoliosis, unspecified: Secondary | ICD-10-CM | POA: Diagnosis present

## 2021-08-01 DIAGNOSIS — Z818 Family history of other mental and behavioral disorders: Secondary | ICD-10-CM

## 2021-08-01 DIAGNOSIS — I25719 Atherosclerosis of autologous vein coronary artery bypass graft(s) with unspecified angina pectoris: Secondary | ICD-10-CM | POA: Diagnosis present

## 2021-08-01 DIAGNOSIS — Z66 Do not resuscitate: Secondary | ICD-10-CM | POA: Diagnosis present

## 2021-08-01 DIAGNOSIS — M810 Age-related osteoporosis without current pathological fracture: Secondary | ICD-10-CM | POA: Diagnosis present

## 2021-08-01 DIAGNOSIS — M1990 Unspecified osteoarthritis, unspecified site: Secondary | ICD-10-CM | POA: Diagnosis present

## 2021-08-01 DIAGNOSIS — K625 Hemorrhage of anus and rectum: Secondary | ICD-10-CM | POA: Diagnosis present

## 2021-08-01 DIAGNOSIS — Z79899 Other long term (current) drug therapy: Secondary | ICD-10-CM

## 2021-08-01 DIAGNOSIS — H9190 Unspecified hearing loss, unspecified ear: Secondary | ICD-10-CM | POA: Diagnosis present

## 2021-08-01 DIAGNOSIS — D6832 Hemorrhagic disorder due to extrinsic circulating anticoagulants: Principal | ICD-10-CM | POA: Diagnosis present

## 2021-08-01 DIAGNOSIS — H409 Unspecified glaucoma: Secondary | ICD-10-CM | POA: Diagnosis present

## 2021-08-01 DIAGNOSIS — E785 Hyperlipidemia, unspecified: Secondary | ICD-10-CM | POA: Diagnosis present

## 2021-08-01 DIAGNOSIS — E538 Deficiency of other specified B group vitamins: Secondary | ICD-10-CM | POA: Diagnosis present

## 2021-08-01 DIAGNOSIS — F32A Depression, unspecified: Secondary | ICD-10-CM | POA: Diagnosis present

## 2021-08-01 DIAGNOSIS — Z7901 Long term (current) use of anticoagulants: Secondary | ICD-10-CM

## 2021-08-01 DIAGNOSIS — Z820 Family history of epilepsy and other diseases of the nervous system: Secondary | ICD-10-CM

## 2021-08-01 DIAGNOSIS — F0390 Unspecified dementia without behavioral disturbance: Secondary | ICD-10-CM | POA: Diagnosis present

## 2021-08-01 DIAGNOSIS — I5032 Chronic diastolic (congestive) heart failure: Secondary | ICD-10-CM | POA: Diagnosis present

## 2021-08-01 DIAGNOSIS — I429 Cardiomyopathy, unspecified: Secondary | ICD-10-CM | POA: Diagnosis present

## 2021-08-01 DIAGNOSIS — Z952 Presence of prosthetic heart valve: Secondary | ICD-10-CM

## 2021-08-01 DIAGNOSIS — E876 Hypokalemia: Secondary | ICD-10-CM

## 2021-08-01 DIAGNOSIS — I10 Essential (primary) hypertension: Secondary | ICD-10-CM | POA: Diagnosis present

## 2021-08-01 DIAGNOSIS — T45515A Adverse effect of anticoagulants, initial encounter: Secondary | ICD-10-CM | POA: Diagnosis present

## 2021-08-01 DIAGNOSIS — D62 Acute posthemorrhagic anemia: Secondary | ICD-10-CM | POA: Diagnosis present

## 2021-08-01 DIAGNOSIS — E43 Unspecified severe protein-calorie malnutrition: Secondary | ICD-10-CM | POA: Diagnosis present

## 2021-08-01 DIAGNOSIS — R4189 Other symptoms and signs involving cognitive functions and awareness: Secondary | ICD-10-CM

## 2021-08-01 DIAGNOSIS — Z20822 Contact with and (suspected) exposure to covid-19: Secondary | ICD-10-CM | POA: Diagnosis present

## 2021-08-01 DIAGNOSIS — Z85820 Personal history of malignant melanoma of skin: Secondary | ICD-10-CM

## 2021-08-01 DIAGNOSIS — I480 Paroxysmal atrial fibrillation: Secondary | ICD-10-CM | POA: Diagnosis present

## 2021-08-01 DIAGNOSIS — Z950 Presence of cardiac pacemaker: Secondary | ICD-10-CM

## 2021-08-01 DIAGNOSIS — Z809 Family history of malignant neoplasm, unspecified: Secondary | ICD-10-CM

## 2021-08-01 HISTORY — PX: EMBOLIZATION: CATH118239

## 2021-08-01 HISTORY — DX: Unspecified hearing loss, unspecified ear: H91.90

## 2021-08-01 LAB — RESP PANEL BY RT-PCR (FLU A&B, COVID) ARPGX2
Influenza A by PCR: NEGATIVE
Influenza B by PCR: NEGATIVE
SARS Coronavirus 2 by RT PCR: NEGATIVE

## 2021-08-01 LAB — CBC
HCT: 31.7 % — ABNORMAL LOW (ref 39.0–52.0)
Hemoglobin: 10.8 g/dL — ABNORMAL LOW (ref 13.0–17.0)
MCH: 35.1 pg — ABNORMAL HIGH (ref 26.0–34.0)
MCHC: 34.1 g/dL (ref 30.0–36.0)
MCV: 102.9 fL — ABNORMAL HIGH (ref 80.0–100.0)
Platelets: 149 10*3/uL — ABNORMAL LOW (ref 150–400)
RBC: 3.08 MIL/uL — ABNORMAL LOW (ref 4.22–5.81)
RDW: 12.8 % (ref 11.5–15.5)
WBC: 4.9 10*3/uL (ref 4.0–10.5)
nRBC: 0 % (ref 0.0–0.2)

## 2021-08-01 LAB — COMPREHENSIVE METABOLIC PANEL
ALT: 13 U/L (ref 0–44)
AST: 21 U/L (ref 15–41)
Albumin: 3.9 g/dL (ref 3.5–5.0)
Alkaline Phosphatase: 69 U/L (ref 38–126)
Anion gap: 9 (ref 5–15)
BUN: 26 mg/dL — ABNORMAL HIGH (ref 8–23)
CO2: 28 mmol/L (ref 22–32)
Calcium: 9.1 mg/dL (ref 8.9–10.3)
Chloride: 106 mmol/L (ref 98–111)
Creatinine, Ser: 0.74 mg/dL (ref 0.61–1.24)
GFR, Estimated: >60 mL/min (ref >60)
Glucose, Bld: 116 mg/dL — ABNORMAL HIGH (ref 70–99)
Potassium: 4.2 mmol/L (ref 3.5–5.1)
Sodium: 143 mmol/L (ref 135–145)
Total Bilirubin: 0.9 mg/dL (ref 0.3–1.2)
Total Protein: 6.5 g/dL (ref 6.5–8.1)

## 2021-08-01 LAB — GLUCOSE, CAPILLARY: Glucose-Capillary: 112 mg/dL — ABNORMAL HIGH (ref 70–99)

## 2021-08-01 LAB — HEMOGLOBIN AND HEMATOCRIT, BLOOD
HCT: 28.3 % — ABNORMAL LOW (ref 39.0–52.0)
HCT: 27.8 % — ABNORMAL LOW (ref 39.0–52.0)
Hemoglobin: 9.3 g/dL — ABNORMAL LOW (ref 13.0–17.0)
Hemoglobin: 9.4 g/dL — ABNORMAL LOW (ref 13.0–17.0)

## 2021-08-01 LAB — PREPARE RBC (CROSSMATCH)

## 2021-08-01 LAB — MRSA NEXT GEN BY PCR, NASAL: MRSA by PCR Next Gen: NOT DETECTED

## 2021-08-01 LAB — PROTIME-INR
INR: 2.2 — ABNORMAL HIGH (ref 0.8–1.2)
Prothrombin Time: 24.7 seconds — ABNORMAL HIGH (ref 11.4–15.2)

## 2021-08-01 SURGERY — EMBOLIZATION
Anesthesia: General

## 2021-08-01 MED ORDER — ONDANSETRON HCL 4 MG PO TABS: 4.0000 mg | ORAL_TABLET | Freq: Four times a day (QID) | ORAL | Status: DC | PRN

## 2021-08-01 MED ORDER — PHENYLEPHRINE HCL (PRESSORS) 10 MG/ML IV SOLN
INTRAVENOUS | Status: AC
  Filled 2021-08-01: qty 1

## 2021-08-01 MED ORDER — ROCURONIUM BROMIDE 10 MG/ML (PF) SYRINGE
PREFILLED_SYRINGE | INTRAVENOUS | Status: AC
  Filled 2021-08-01: qty 10

## 2021-08-01 MED ORDER — PANTOPRAZOLE SODIUM 40 MG IV SOLR
40.0000 mg | INTRAVENOUS | Status: DC
  Administered 2021-08-01 – 2021-08-03 (×3): 40 mg via INTRAVENOUS
  Filled 2021-08-01 (×4): qty 40

## 2021-08-01 MED ORDER — ONDANSETRON HCL 4 MG/2ML IJ SOLN: 4.0000 mg | Freq: Four times a day (QID) | INTRAMUSCULAR | Status: DC | PRN

## 2021-08-01 MED ORDER — PROPOFOL 10 MG/ML IV BOLUS
INTRAVENOUS | Status: DC | PRN
Start: 2021-08-01 — End: 2021-08-01
  Administered 2021-08-01: 60 mg via INTRAVENOUS

## 2021-08-01 MED ORDER — FENTANYL CITRATE PF 50 MCG/ML IJ SOSY
25.0000 ug | PREFILLED_SYRINGE | INTRAMUSCULAR | Status: DC | PRN
  Administered 2021-08-01 (×2): 25 ug via INTRAVENOUS

## 2021-08-01 MED ORDER — TRAZODONE HCL 50 MG PO TABS
50.0000 mg | ORAL_TABLET | Freq: Every day | ORAL | Status: DC
  Administered 2021-08-02 – 2021-08-04 (×2): 50 mg via ORAL
  Filled 2021-08-01 (×2): qty 1

## 2021-08-01 MED ORDER — CEFAZOLIN SODIUM-DEXTROSE 2-4 GM/100ML-% IV SOLN
2.0000 g | Freq: Once | INTRAVENOUS | Status: AC
  Administered 2021-08-01: 2 g via INTRAVENOUS

## 2021-08-01 MED ORDER — MELATONIN 5 MG PO TABS
2.5000 mg | ORAL_TABLET | Freq: Every day | ORAL | Status: DC
  Administered 2021-08-02 – 2021-08-04 (×2): 2.5 mg via ORAL
  Filled 2021-08-01 (×2): qty 1

## 2021-08-01 MED ORDER — PROPOFOL 10 MG/ML IV BOLUS
INTRAVENOUS | Status: AC
  Filled 2021-08-01: qty 20

## 2021-08-01 MED ORDER — VITAMIN K1 10 MG/ML IJ SOLN
5.0000 mg | Freq: Once | INTRAMUSCULAR | Status: DC
Start: 2021-08-01 — End: 2021-08-03

## 2021-08-01 MED ORDER — MIRTAZAPINE 15 MG PO TABS
7.5000 mg | ORAL_TABLET | Freq: Every day | ORAL | Status: DC
  Administered 2021-08-02 – 2021-08-04 (×2): 7.5 mg via ORAL
  Filled 2021-08-01 (×2): qty 1

## 2021-08-01 MED ORDER — DEXAMETHASONE SODIUM PHOSPHATE 10 MG/ML IJ SOLN
INTRAMUSCULAR | Status: DC | PRN
  Administered 2021-08-01: 5 mg via INTRAVENOUS

## 2021-08-01 MED ORDER — LIDOCAINE HCL (CARDIAC) PF 100 MG/5ML IV SOSY
PREFILLED_SYRINGE | INTRAVENOUS | Status: DC | PRN
  Administered 2021-08-01: 60 mg via INTRAVENOUS

## 2021-08-01 MED ORDER — LATANOPROST 0.005 % OP SOLN
1.0000 [drp] | Freq: Every day | OPHTHALMIC | Status: DC
  Administered 2021-08-01 – 2021-08-04 (×3): 1 [drp] via OPHTHALMIC
  Filled 2021-08-01: qty 2.5

## 2021-08-01 MED ORDER — PHENYLEPHRINE HCL (PRESSORS) 10 MG/ML IV SOLN
INTRAVENOUS | Status: DC | PRN
  Administered 2021-08-01 (×2): 100 ug via INTRAVENOUS

## 2021-08-01 MED ORDER — HALOPERIDOL LACTATE 5 MG/ML IJ SOLN
INTRAMUSCULAR | Status: DC | PRN
  Administered 2021-08-01: 2 mg via INTRAVENOUS
  Administered 2021-08-01: 3 mg via INTRAVENOUS

## 2021-08-01 MED ORDER — NIACIN ER 500 MG PO TBCR
500.0000 mg | EXTENDED_RELEASE_TABLET | Freq: Every day | ORAL | Status: DC
  Administered 2021-08-02 – 2021-08-04 (×3): 500 mg via ORAL
  Filled 2021-08-01 (×4): qty 1

## 2021-08-01 MED ORDER — IODIXANOL 320 MG/ML IV SOLN
INTRAVENOUS | Status: DC | PRN
  Administered 2021-08-01: 35 mL

## 2021-08-01 MED ORDER — SODIUM CHLORIDE 0.9 % IV SOLN: INTRAVENOUS | Status: DC

## 2021-08-01 MED ORDER — CHLORHEXIDINE GLUCONATE CLOTH 2 % EX PADS
6.0000 | MEDICATED_PAD | Freq: Every day | CUTANEOUS | Status: DC
  Administered 2021-08-01 – 2021-08-03 (×3): 6 via TOPICAL

## 2021-08-01 MED ORDER — FERROUS SULFATE 325 (65 FE) MG PO TABS
325.0000 mg | ORAL_TABLET | ORAL | Status: DC
  Administered 2021-08-03: 325 mg via ORAL
  Filled 2021-08-01: qty 1

## 2021-08-01 MED ORDER — ACETAMINOPHEN 325 MG PO TABS
650.0000 mg | ORAL_TABLET | ORAL | Status: DC | PRN
  Administered 2021-08-02: 650 mg via ORAL
  Filled 2021-08-01: qty 2

## 2021-08-01 MED ORDER — ONDANSETRON HCL 4 MG/2ML IJ SOLN: 4.0000 mg | Freq: Once | INTRAMUSCULAR | Status: DC | PRN

## 2021-08-01 MED ORDER — LORAZEPAM 2 MG/ML IJ SOLN
1.0000 mg | Freq: Once | INTRAMUSCULAR | Status: AC
  Administered 2021-08-01: 1 mg via INTRAVENOUS
  Filled 2021-08-01: qty 1

## 2021-08-01 MED ORDER — ROCURONIUM BROMIDE 100 MG/10ML IV SOLN
INTRAVENOUS | Status: DC | PRN
  Administered 2021-08-01: 30 mg via INTRAVENOUS

## 2021-08-01 MED ORDER — EPHEDRINE 5 MG/ML INJ
INTRAVENOUS | Status: AC
  Filled 2021-08-01: qty 5

## 2021-08-01 MED ORDER — VITAMIN D 25 MCG (1000 UNIT) PO TABS
2000.0000 [IU] | ORAL_TABLET | Freq: Every day | ORAL | Status: DC
  Administered 2021-08-02 – 2021-08-04 (×3): 2000 [IU] via ORAL
  Filled 2021-08-01 (×3): qty 2

## 2021-08-01 MED ORDER — FENTANYL CITRATE (PF) 100 MCG/2ML IJ SOLN
INTRAMUSCULAR | Status: AC
  Filled 2021-08-01: qty 2

## 2021-08-01 MED ORDER — SUCCINYLCHOLINE CHLORIDE 200 MG/10ML IV SOSY
PREFILLED_SYRINGE | INTRAVENOUS | Status: DC | PRN
  Administered 2021-08-01: 80 mg via INTRAVENOUS

## 2021-08-01 MED ORDER — BRIMONIDINE TARTRATE 0.15 % OP SOLN
1.0000 [drp] | Freq: Two times a day (BID) | OPHTHALMIC | Status: DC
  Administered 2021-08-01 – 2021-08-04 (×6): 1 [drp] via OPHTHALMIC
  Filled 2021-08-01: qty 5

## 2021-08-01 MED ORDER — SODIUM CHLORIDE 0.9 % IV SOLN: Freq: Once | INTRAVENOUS | Status: DC

## 2021-08-01 MED ORDER — SUCCINYLCHOLINE CHLORIDE 200 MG/10ML IV SOSY
PREFILLED_SYRINGE | INTRAVENOUS | Status: AC
  Filled 2021-08-01: qty 10

## 2021-08-01 MED ORDER — IOHEXOL 350 MG/ML SOLN
100.0000 mL | Freq: Once | INTRAVENOUS | Status: AC | PRN
  Administered 2021-08-01: 100 mL via INTRAVENOUS

## 2021-08-01 MED ORDER — LACTATED RINGERS IV BOLUS
500.0000 mL | Freq: Once | INTRAVENOUS | Status: AC
  Administered 2021-08-01: 500 mL via INTRAVENOUS

## 2021-08-01 MED ORDER — HALOPERIDOL LACTATE 5 MG/ML IJ SOLN
INTRAMUSCULAR | Status: AC
  Administered 2021-08-02: 1 mg via INTRAVENOUS
  Filled 2021-08-01: qty 1

## 2021-08-01 MED ORDER — LIDOCAINE HCL (PF) 2 % IJ SOLN
INTRAMUSCULAR | Status: AC
  Filled 2021-08-01: qty 2

## 2021-08-01 MED ORDER — ONDANSETRON HCL 4 MG/2ML IJ SOLN
INTRAMUSCULAR | Status: DC | PRN
  Administered 2021-08-01: 4 mg via INTRAVENOUS

## 2021-08-01 MED ORDER — SUGAMMADEX SODIUM 200 MG/2ML IV SOLN
INTRAVENOUS | Status: DC | PRN
  Administered 2021-08-01: 150 mg via INTRAVENOUS

## 2021-08-01 MED ORDER — TAMSULOSIN HCL 0.4 MG PO CAPS
0.4000 mg | ORAL_CAPSULE | Freq: Every day | ORAL | Status: DC
  Administered 2021-08-02 – 2021-08-04 (×3): 0.4 mg via ORAL
  Filled 2021-08-01 (×3): qty 1

## 2021-08-01 SURGICAL SUPPLY — 17 items
BLOCK BEAD 500-700 (Vascular Products) ×1 IMPLANT
CATH ANGIO 5F PIGTAIL 65CM (CATHETERS) ×1 IMPLANT
CATH MICROCATH PRGRT 2.8F 110 (CATHETERS) IMPLANT
CATH VS15FR (CATHETERS) ×1 IMPLANT
COVER PROBE U/S 5X48 (MISCELLANEOUS) ×1 IMPLANT
DEVICE SAFEGUARD 24CM (GAUZE/BANDAGES/DRESSINGS) ×1 IMPLANT
DEVICE STARCLOSE SE CLOSURE (Vascular Products) ×1 IMPLANT
GLIDEWIRE STIFF .35X180X3 HYDR (WIRE) ×1 IMPLANT
KIT MICROPUNCTURE NIT STIFF (SHEATH) ×1 IMPLANT
MICROCATH PROGREAT 2.8F 110 CM (CATHETERS) ×2
NDL ENTRY 21GA 7CM ECHOTIP (NEEDLE) IMPLANT
NEEDLE ENTRY 21GA 7CM ECHOTIP (NEEDLE) ×2 IMPLANT
PACK ANGIOGRAPHY (CUSTOM PROCEDURE TRAY) ×2 IMPLANT
SHEATH BRITE TIP 5FRX11 (SHEATH) ×1 IMPLANT
SYR MEDRAD MARK 7 150ML (SYRINGE) ×1 IMPLANT
TUBING CONTRAST HIGH PRESS 72 (TUBING) ×1 IMPLANT
WIRE GUIDERIGHT .035X150 (WIRE) ×1 IMPLANT

## 2021-08-01 NOTE — Anesthesia Postprocedure Evaluation (Signed)
Anesthesia Post Note  Patient: Kevin [name]  Procedure(s) Performed: EMBOLIZATION  Patient location during evaluation: PACU Anesthesia Type: General Level of consciousness: patient uncooperative and combative (similar to preop) Pain management: pain level controlled Vital Signs Assessment: post-procedure vital signs reviewed and stable Respiratory status: spontaneous breathing, nonlabored ventilation, respiratory function stable and patient connected to nasal cannula oxygen Cardiovascular status: blood pressure returned to baseline and stable Postop Assessment: no apparent nausea or vomiting Anesthetic complications: no   No notable events documented.   Last Vitals:  Vitals:   08/01/21 1830 08/01/21 1844  BP: (!) 150/114 (!) 148/110  Pulse: 67 75  Resp: 20   Temp:  36.8 C  SpO2: 93%     Last Pain:  Vitals:   08/01/21 1844  TempSrc: Temporal                 Arita Miss

## 2021-08-01 NOTE — H&P (Signed)
History and Physical    Kevin [name] [id number] DOB: [birth year] DOA: 08/01/2021  PCP: Kirk Ruths, MD   Patient coming from: Kevin [name]  I have personally briefly reviewed patient's old medical records in [place]  Chief Complaint: Rectal bleeding Most of the history was obtained from the EMR as patient has dementia and is very hard of hearing.  HPI: Kevin [name] is a 87 y.o. male with medical history significant for hypertension, dyslipidemia, coronary artery disease, atrial fibrillation, mechanical valve on chronic anticoagulation therapy with Coumadin and chronic diastolic dysfunction CHF who was sent to the ER via EMS for evaluation of rectal bleeding. The staff at the skilled nursing facility noted that he had bowel movements containing blood and so EMS was called.  ER provider did a rectal exam and noticed bright red blood around the rectal area. I am unable to do review of systems on this patient since he is very hard of hearing. Labs show sodium 143, potassium 4.2, chloride 106, bicarb 28, glucose 116, BUN 26, creatinine 0.74, calcium 9.1, alkaline phosphatase 69, albumin 3.9, AST 21, ALT 13, total protein 6.5, white count 4.9, hemoglobin 10.8, hematocrit 39.7, MCV 102.9, RDW 12.8, platelet count 149, PT 24.7, INR 2.2, Respiratory viral panel is pending CT scan of abdomen and pelvis is pending Twelve-lead EKG reviewed by me shows sinus rhythm with LVH.   ED Course: This patient is a 87 year old male who presents to the emergency room via EMS for evaluation of rectal bleeding. He is on chronic anticoagulation therapy with Coumadin for A. fib as well as mechanical valve. His baseline hemoglobin is 12 and today on admission it is 10.8 g/dl He will be referred to observation status for further evaluation.    Review of Systems: As per HPI otherwise all other systems reviewed and negative.    Past Medical History:  Diagnosis Date   Atrial fibrillation ([place])     Cancer ([place])    melanoma- right ear   Cardiomyopathy, secondary ([place])    Chronic headaches started age 57   Congestive heart failure (CHF) ([place])    Coronary artery disease involving autologous vein coronary bypass graft with angina pectoris ([place]) 10/18/2018   Coronary atherosclerosis of native coronary artery    DJD (degenerative joint disease)    Essential hypertension, benign    GERD (gastroesophageal reflux disease)    Glaucoma    History of mitral valve replacement with mechanical valve    HOH (hard of hearing)    Hyperlipidemia, unspecified    Hypertension    Lumbago    Osteoporosis    secondary to low testoerone   S/P MVR (mitral valve replacement)    Sciatica    Scoliosis     Past Surgical History:  Procedure Laterality Date   CARDIOVERSION  2013   CATARACT EXTRACTION, BILATERAL     x 2   CORONARY ARTERY BYPASS GRAFT  12/2014   coronary artery bypass w/arterial grafts    HERNIA REPAIR     x 2   MELANOMA EXCISION     right ear   MITRAL VALVE REPLACEMENT  1994   [place]   PERMANENT PACEMAKER INSERTION Left 12/22/2014     reports that he has never smoked. He has never used smokeless tobacco. He reports that he does not drink alcohol and does not use drugs.  Allergies  Allergen Reactions   Cymbalta [Duloxetine Hcl] Other (See Comments)  Urinary retention, constipation and insomnia   Aspirin     Heart operation was told to never take Aspirin    Family History  Problem Relation Age of Onset   Cancer Father        unknown   Alzheimer's disease Brother    Arrhythmia Brother    Mental illness Mother       Prior to Admission medications   Medication Sig Start Date End Date Taking? Authorizing Provider  acetaminophen (TYLENOL) 325 MG tablet Take 650 mg by mouth every 4 (four) hours as needed for fever. Maximum dose for 24 hours is 3000 mg from all sources of Acetaminophen/tylenol    [provider]  amLODipine  (NORVASC) 5 MG tablet Take 5 mg by mouth daily. 06/14/21   [provider]  brimonidine (ALPHAGAN) 0.15 % ophthalmic solution Place 1 drop into both eyes 2 (two) times daily.  08/21/11   [provider]  Cholecalciferol (VITAMIN D) 50 MCG (2000 UT) tablet Take 2,000 Units by mouth daily. 10/14/18   [provider]  Coenzyme Q10 10 MG capsule Take 100 mg by mouth 4 (four) times daily. 10/14/18   [provider]  diclofenac Sodium (VOLTAREN) 1 % GEL Apply 4 g topically 4 (four) times daily. For left knee pain 05/26/20   [provider]  docusate sodium (COLACE) 100 MG capsule Take 2 capsules (200 mg total) by mouth at bedtime. 03/06/18   Crecencio Mc, MD  ferrous sulfate 325 (65 FE) MG tablet Take 1 tablet by mouth every other day.  06/13/12   Crecencio Mc, MD  furosemide (LASIX) 20 MG tablet Take 1 tablet (20 mg total) by mouth daily. 06/18/17   Fritzi Mandes, MD  latanoprost (XALATAN) 0.005 % ophthalmic solution Place 1 drop into both eyes at bedtime.  08/13/11   [provider]  lidocaine (LIDODERM) 5 % Place 1 patch onto the skin daily. Remove & Discard patch within 12 hours or as directed by MD    [provider]  loperamide (IMODIUM) 2 MG capsule Take 2-4 mg by mouth as needed for diarrhea or loose stools. Take 2 tablets to = 4 mg for initial loose stool, take 1 tablet to = 2 mg for each subsequent loose stool for up to 8 doses/24 hours Patient not taking: No sig reported    [provider]  losartan (COZAAR) 100 MG tablet TAKE 1 TABLET BY MOUTH DAILY 12/23/18   Crecencio Mc, MD  meclizine (ANTIVERT) 25 MG tablet Take 25 mg by mouth 3 (three) times daily as needed for dizziness. Patient not taking: No sig reported    [provider]  Melatonin 3 MG TABS Take 1 tablet by mouth at bedtime.  09/28/18   [provider]  metoprolol tartrate (LOPRESSOR) 25 MG tablet Take 25 mg by mouth 2 (two) times daily. Hold for BP  less than 100/'s or pulse less than 60 09/28/18   [provider]  mirtazapine (REMERON) 15 MG tablet Take 7.5 mg by mouth at bedtime. 06/26/21   [provider]  niacinamide 500 MG tablet Take 500 mg by mouth daily. 10/14/18   [provider]  NON FORMULARY Diet Type: regular    [provider]  Nutritional Supplements (ENSURE ENLIVE PO) Take 0.08 g by mouth every other day.    [provider]  omeprazole (PRILOSEC) 40 MG capsule Take 40 mg by mouth 2 (two) times daily.    [provider]  ondansetron (ZOFRAN-ODT) 4 MG disintegrating tablet Take 4 mg by mouth every 4 (four) hours as needed for nausea or vomiting. Patient not taking: No sig reported 10/01/18   [provider]  tamsulosin (FLOMAX) 0.4 MG CAPS capsule TAKE 1 CAPSULE BY MOUTH EVERY DAY 08/28/18   Crecencio Mc, MD  traZODone (DESYREL) 50 MG tablet Take 50 mg by mouth at bedtime. 06/18/21   [provider]  warfarin (COUMADIN) 1 MG tablet Take 0.5 mg by mouth daily.    [provider]  warfarin (COUMADIN) 3 MG tablet Take 3 mg by mouth daily.    [provider]  warfarin (COUMADIN) 4 MG tablet Take 4 mg by mouth daily. Patient not taking: No sig reported    [provider]  amitriptyline (ELAVIL) 25 MG tablet Take 1 tablet (25 mg total) by mouth at bedtime. 09/21/11 03/04/12  Crecencio Mc, MD    Physical Exam: Vitals:   08/01/21 0949 08/01/21 1045 08/01/21 1130 08/01/21 1230  BP: (!) 159/65 (!) 152/82 (!) 153/72 (!) 142/68  Pulse: 62 66 60 61  Resp: '20 20 20 18  '$ Temp: 98.8 F (37.1 C)     TempSrc: Oral     SpO2: 97% 98% 99% 100%  Weight: 59 kg     Height: '5\' 7"'$  (1.702 m)        Vitals:   08/01/21 0949 08/01/21 1045 08/01/21 1130 08/01/21 1230  BP: (!) 159/65 (!) 152/82 (!) 153/72 (!) 142/68  Pulse: 62 66 60 61  Resp: '20 20 20 18  '$ Temp: 98.8 F (37.1 C)     TempSrc: Oral     SpO2: 97% 98% 99% 100%  Weight: 59 kg      Height: '5\' 7"'$  (1.702 m)         Constitutional: Alert and oriented x 1 . Not in any apparent distress HEENT:      Head: Normocephalic and atraumatic.         Eyes: PERLA, EOMI, Conjunctivae are normal. Sclera is non-icteric.       Mouth/Throat: Mucous membranes are moist.       Neck: Supple with no signs of meningismus. Cardiovascular: Regular rate and rhythm. No murmurs, gallops, or rubs. 2+ symmetrical distal pulses are present . No JVD. No LE edema Respiratory: Respiratory effort normal .Lungs sounds clear bilaterally. No wheezes, crackles, or rhonchi.  Gastrointestinal: Soft, epigastric/periumbilical tenderness with palpation and non distended with positive bowel sounds.  Genitourinary: No CVA tenderness. Musculoskeletal: Nontender with normal range of motion in all extremities. No cyanosis, or erythema of extremities. Neurologic:  Face is symmetric. Moving all extremities. No gross focal neurologic deficits . Skin: Skin is warm, dry.  No rash or ulcers Psychiatric: Mood and affect are normal    Labs on Admission: I have personally reviewed following labs and imaging studies  CBC: Recent Labs  Lab 08/01/21 0931  WBC 4.9  HGB 10.8*  HCT 31.7*  MCV 102.9*  PLT 123456*   Basic Metabolic Panel: Recent Labs  Lab 08/01/21 0931  NA 143  K 4.2  CL 106  CO2 28  GLUCOSE 116*  BUN 26*  CREATININE 0.74  CALCIUM 9.1   GFR: Estimated Creatinine Clearance: 47.1 mL/min (by C-G formula based on SCr of 0.74 mg/dL). Liver Function Tests: Recent Labs  Lab 08/01/21 0931  AST 21  ALT 13  ALKPHOS 69  BILITOT 0.9  PROT 6.5  ALBUMIN 3.9   No results for input(s): LIPASE, AMYLASE in  the last 168 hours. No results for input(s): AMMONIA in the last 168 hours. Coagulation Profile: Recent Labs  Lab 08/01/21 0931  INR 2.2*   Cardiac Enzymes: No results for input(s): CKTOTAL, CKMB, CKMBINDEX, TROPONINI in the last 168 hours. BNP (last 3 results) No results for input(s):  PROBNP in the last 8760 hours. HbA1C: No results for input(s): HGBA1C in the last 72 hours. CBG: No results for input(s): GLUCAP in the last 168 hours. Lipid Profile: No results for input(s): CHOL, HDL, LDLCALC, TRIG, CHOLHDL, LDLDIRECT in the last 72 hours. Thyroid Function Tests: No results for input(s): TSH, T4TOTAL, FREET4, T3FREE, THYROIDAB in the last 72 hours. Anemia Panel: No results for input(s): VITAMINB12, FOLATE, FERRITIN, TIBC, IRON, RETICCTPCT in the last 72 hours. Urine analysis:    Component Value Date/Time   COLORURINE YELLOW (A) 08/11/2020 1000   APPEARANCEUR CLEAR (A) 08/11/2020 1000   LABSPEC 1.011 08/11/2020 1000   PHURINE 7.0 08/11/2020 1000   GLUCOSEU NEGATIVE 08/11/2020 1000   GLUCOSEU NEGATIVE 02/21/2018 1210   HGBUR NEGATIVE 08/11/2020 1000   BILIRUBINUR NEGATIVE 08/11/2020 1000   BILIRUBINUR negative 06/19/2018 1230   KETONESUR NEGATIVE 08/11/2020 1000   PROTEINUR NEGATIVE 08/11/2020 1000   UROBILINOGEN 0.2 06/19/2018 1230   UROBILINOGEN 0.2 02/21/2018 1210   NITRITE NEGATIVE 08/11/2020 1000   LEUKOCYTESUR NEGATIVE 08/11/2020 1000    Radiological Exams on Admission: No results found.   Assessment/Plan Principal Problem:   ABLA (acute blood loss anemia) Active Problems:   History of mitral valve replacement with mechanical valve   Depression   Benign essential HTN   GERD without esophagitis   Coronary artery disease involving autologous vein coronary bypass graft with angina pectoris ([facility])   Rectal bleeding   Chronic diastolic CHF (congestive heart failure) ([facility])     Acute blood loss anemia From rectal bleeding in a patient who is on chronic anticoagulation therapy with Coumadin Obtain serial H&H Type and screen and will transfuse for hemoglobin less than 7g/dl Hold Coumadin Place patient on IV Protonix Consult GI    Depression Continue mirtazapine and trazodone    History of chronic diastolic dysfunction CHF. Hold furosemide,  metoprolol, losartan and amlodipine    History of paroxysmal A. Fib/status post mechanical mitral valve replacement Hold Coumadin for now due to active GI bleed Days PT/INR  DVT prophylaxis: SCD  Code Status: DNR  Family Communication: Called and spoke to PATIENT'S niece/healthcare power of attorney Chrissie Noa about patient's condition and plan of care.  All questions and concerns have been addressed.  CODE STATUS was discussed and he is a DNR Disposition Plan: Back to previous home environment Consults called: Gastroenterology  Status:Observation    Collier Bullock MD [facility]     08/01/2021, 12:49 PM

## 2021-08-01 NOTE — Consult Note (Signed)
[facility] Vascular Consult Note  MRN : [id number]  Kevin [name] is a 87 y.o. ([birth year]) male who presents with chief complaint of  Chief Complaint  Patient presents with   GI Bleeding   History of Present Illness:  Of note: The patient does have a past medical history of dementia and is unable to contribute to his history.  Information for this consultation was taken from previous epic notation and speaking to his niece.  Kevin [name] is a 87 y.o. male with medical history significant for hypertension, dyslipidemia, coronary artery disease, atrial fibrillation, mechanical valve on chronic anticoagulation therapy with Coumadin and chronic diastolic dysfunction CHF who was sent to the ER via EMS for evaluation of rectal bleeding.  The staff at the skilled nursing facility noted that he had bowel movements containing blood and so EMS was called.  ER provider did a rectal exam and noticed bright red blood around the rectal area.  Labs show sodium 143, potassium 4.2, chloride 106, bicarb 28, glucose 116, BUN 26, creatinine 0.74, calcium 9.1, alkaline phosphatase 69, albumin 3.9, AST 21, ALT 13, total protein 6.5, white count 4.9, hemoglobin 10.8, hematocrit 39.7, MCV 102.9, RDW 12.8, platelet count 149, PT 24.7, INR 2.2,  CT of the abdomen pelvis was notable for active extravasation in the rectum.  Patient had at least 2 bloody bowel movements in the emergency department.  Vascular surgery was consulted by Dr. Francine Graven for possible endovascular intervention in the setting of rectal bleeding  Current Facility-Administered Medications  Medication Dose Route Frequency Provider Last Rate Last Admin   0.9 %  sodium chloride infusion   Intravenous Continuous Agbata, Tochukwu, MD       0.9 %  sodium chloride infusion   Intravenous Once Agbata, Tochukwu, MD       acetaminophen (TYLENOL) tablet 650 mg  650 mg Oral Q4H PRN Agbata, Tochukwu, MD       brimonidine (ALPHAGAN)  0.15 % ophthalmic solution 1 drop  1 drop Both Eyes BID Agbata, Tochukwu, MD       cholecalciferol (VITAMIN D3) tablet 2,000 Units  2,000 Units Oral Daily Agbata, Tochukwu, MD       ferrous sulfate tablet 325 mg  325 mg Oral QODAY Agbata, Tochukwu, MD       latanoprost (XALATAN) 0.005 % ophthalmic solution 1 drop  1 drop Both Eyes QHS Agbata, Tochukwu, MD       melatonin tablet 2.5 mg  2.5 mg Oral QHS Agbata, Tochukwu, MD       mirtazapine (REMERON) tablet 7.5 mg  7.5 mg Oral QHS Agbata, Tochukwu, MD       niacin (SLO-NIACIN) CR tablet 500 mg  500 mg Oral Daily Agbata, Tochukwu, MD       ondansetron (ZOFRAN) tablet 4 mg  4 mg Oral Q6H PRN Agbata, Tochukwu, MD       Or   ondansetron (ZOFRAN) injection 4 mg  4 mg Intravenous Q6H PRN Agbata, Tochukwu, MD       pantoprazole (PROTONIX) injection 40 mg  40 mg Intravenous Q24H Agbata, Tochukwu, MD   40 mg at 08/01/21 1500   phytonadione (VITAMIN K) SQ injection 5 mg  5 mg Subcutaneous Once Blake Divine, MD       tamsulosin (FLOMAX) capsule 0.4 mg  0.4 mg Oral Daily Agbata, Tochukwu, MD       traZODone (DESYREL) tablet 50 mg  50 mg Oral QHS Collier Bullock, MD  Current Outpatient Medications  Medication Sig Dispense Refill   amLODipine (NORVASC) 5 MG tablet Take 5 mg by mouth daily.     brimonidine (ALPHAGAN) 0.15 % ophthalmic solution Place 1 drop into both eyes 2 (two) times daily.      Cholecalciferol (VITAMIN D) 50 MCG (2000 UT) tablet Take 2,000 Units by mouth daily.     Coenzyme Q10 10 MG capsule Take 100 mg by mouth 4 (four) times daily.     diclofenac Sodium (VOLTAREN) 1 % GEL Apply 4 g topically 4 (four) times daily. For left knee pain     docusate sodium (COLACE) 100 MG capsule Take 2 capsules (200 mg total) by mouth at bedtime. 60 capsule 5   furosemide (LASIX) 20 MG tablet Take 1 tablet (20 mg total) by mouth daily. 30 tablet 2   latanoprost (XALATAN) 0.005 % ophthalmic solution Place 1 drop into both eyes at bedtime.       lidocaine (LIDODERM) 5 % Place 1 patch onto the skin daily. Remove & Discard patch within 12 hours or as directed by MD     losartan (COZAAR) 100 MG tablet TAKE 1 TABLET BY MOUTH DAILY 90 tablet 0   Melatonin 3 MG TABS Take 1 tablet by mouth at bedtime.      metoprolol tartrate (LOPRESSOR) 25 MG tablet Take 25 mg by mouth 2 (two) times daily. Hold for BP less than 100/'s or pulse less than 60     mirtazapine (REMERON) 15 MG tablet Take 7.5 mg by mouth at bedtime.     niacinamide 500 MG tablet Take 500 mg by mouth daily.     omeprazole (PRILOSEC) 40 MG capsule Take 40 mg by mouth 2 (two) times daily.     polyethylene glycol (MIRALAX / GLYCOLAX) 17 g packet Take 17 g by mouth daily.     tamsulosin (FLOMAX) 0.4 MG CAPS capsule TAKE 1 CAPSULE BY MOUTH EVERY DAY 30 capsule 11   traZODone (DESYREL) 50 MG tablet Take 50 mg by mouth at bedtime.     warfarin (COUMADIN) 3 MG tablet Take 3 mg by mouth daily.     acetaminophen (TYLENOL) 325 MG tablet Take 650 mg by mouth every 4 (four) hours as needed for fever. Maximum dose for 24 hours is 3000 mg from all sources of Acetaminophen/tylenol     ferrous sulfate 325 (65 FE) MG tablet Take 1 tablet by mouth every other day.      loperamide (IMODIUM) 2 MG capsule Take 2-4 mg by mouth as needed for diarrhea or loose stools. Take 2 tablets to = 4 mg for initial loose stool, take 1 tablet to = 2 mg for each subsequent loose stool for up to 8 doses/24 hours (Patient not taking: No sig reported)     meclizine (ANTIVERT) 25 MG tablet Take 25 mg by mouth 3 (three) times daily as needed for dizziness. (Patient not taking: No sig reported)     NON FORMULARY Diet Type: regular     Nutritional Supplements (ENSURE ENLIVE PO) Take 0.08 g by mouth every other day.     ondansetron (ZOFRAN-ODT) 4 MG disintegrating tablet Take 4 mg by mouth every 4 (four) hours as needed for nausea or vomiting. (Patient not taking: No sig reported)     warfarin (COUMADIN) 1 MG tablet Take 0.5 mg by  mouth daily. (Patient not taking: Reported on 08/01/2021)     warfarin (COUMADIN) 4 MG tablet Take 4 mg by mouth daily. (Patient not taking:  No sig reported)     Past Medical History:  Diagnosis Date   Atrial fibrillation ([place])    Cancer (HCC)    melanoma- right ear   Cardiomyopathy, secondary ([place])    Chronic headaches started age 21   Congestive heart failure (CHF) (HCC)    Coronary artery disease involving autologous vein coronary bypass graft with angina pectoris ([place]) 10/18/2018   Coronary atherosclerosis of native coronary artery    DJD (degenerative joint disease)    Essential hypertension, benign    GERD (gastroesophageal reflux disease)    Glaucoma    History of mitral valve replacement with mechanical valve    HOH (hard of hearing)    Hyperlipidemia, unspecified    Hypertension    Lumbago    Osteoporosis    secondary to low testoerone   S/P MVR (mitral valve replacement)    Sciatica    Scoliosis    Past Surgical History:  Procedure Laterality Date   CARDIOVERSION  2013   CATARACT EXTRACTION, BILATERAL     x 2   CORONARY ARTERY BYPASS GRAFT  12/2014   coronary artery bypass w/arterial grafts    HERNIA REPAIR     x 2   MELANOMA EXCISION     right ear   MITRAL VALVE REPLACEMENT  1994   [place]   PERMANENT PACEMAKER INSERTION Left 12/22/2014   Social History Social History   Tobacco Use   Smoking status: Never   Smokeless tobacco: Never  Vaping Use   Vaping Use: Never used  Substance Use Topics   Alcohol use: No   Drug use: No   Family History Family History  Problem Relation Age of Onset   Cancer Father        unknown   Alzheimer's disease Brother    Arrhythmia Brother    Mental illness Mother   Unable to add any additional information as the patient has a past medical history of dementia and unable to provide a history  Allergies  Allergen Reactions   Aspirin     Heart operation was told to never take Aspirin    Cymbalta [Duloxetine Hcl] Other (See Comments)    Urinary retention, constipation and insomnia   REVIEW OF SYSTEMS (Negative unless checked)  Unable to add any additional information as the patient has a past medical history of dementia and unable to provide a history  Constitutional: '[]'$ Weight loss  '[]'$ Fever  '[]'$ Chills Cardiac: '[]'$ Chest pain   '[]'$ Chest pressure   '[]'$ Palpitations   '[]'$ Shortness of breath when laying flat   '[]'$ Shortness of breath at rest   '[]'$ Shortness of breath with exertion. Vascular:  '[]'$ Pain in legs with walking   '[]'$ Pain in legs at rest   '[]'$ Pain in legs when laying flat   '[]'$ Claudication   '[]'$ Pain in feet when walking  '[]'$ Pain in feet at rest  '[]'$ Pain in feet when laying flat   '[]'$ History of DVT   '[]'$ Phlebitis   '[]'$ Swelling in legs   '[]'$ Varicose veins   '[]'$ Non-healing ulcers Pulmonary:   '[]'$ Uses home oxygen   '[]'$ Productive cough   '[]'$ Hemoptysis   '[]'$ Wheeze  '[]'$ COPD   '[]'$ Asthma Neurologic:  '[]'$ Dizziness  '[]'$ Blackouts   '[]'$ Seizures   '[]'$ History of stroke   '[]'$ History of TIA  '[]'$ Aphasia   '[]'$ Temporary blindness   '[]'$ Dysphagia   '[]'$ Weakness or numbness in arms   '[]'$ Weakness or numbness in legs Musculoskeletal:  '[]'$ Arthritis   '[]'$ Joint swelling   '[]'$ Joint pain   '[]'$ Low back  pain Hematologic:  '[]'$ Easy bruising  '[]'$ Easy bleeding   '[]'$ Hypercoagulable state   '[]'$ Anemic  '[]'$ Hepatitis Gastrointestinal:  '[]'$ Blood in stool   '[]'$ Vomiting blood  '[]'$ Gastroesophageal reflux/heartburn   '[]'$ Difficulty swallowing. Genitourinary:  '[]'$ Chronic kidney disease   '[]'$ Difficult urination  '[]'$ Frequent urination  '[]'$ Burning with urination   '[]'$ Blood in urine Skin:  '[]'$ Rashes   '[]'$ Ulcers   '[]'$ Wounds Psychological:  '[]'$ History of anxiety   '[]'$  History of major depression.  Physical Examination  Vitals:   08/01/21 1330 08/01/21 1400 08/01/21 1445 08/01/21 1500  BP: (!) 167/70 (!) 140/122 136/70 131/88  Pulse: 67 86 82 78  Resp: 14 20 (!) 23 17  Temp:      TempSrc:      SpO2: 100% 96% 100% 100%  Weight:      Height:       Body mass index is 20.36  kg/m. Gen: Patient is visibly agitated.  Patient with dementia very hard of hearing.  No acute distress. Head: [place]/AT, No temporalis wasting. Prominent temp pulse not noted. Ear/Nose/Throat: Hearing grossly intact, nares w/o erythema or drainage, oropharynx w/o Erythema/Exudate Eyes: Sclera non-icteric, conjunctiva clear Neck: Trachea midline.  No JVD.  Pulmonary:  Good air movement, respirations not labored, equal bilaterally.  Cardiac: RRR, normal S1, S2. Vascular:  Vessel Right Left  Radial Palpable Palpable  Ulnar Palpable Palpable                               Gastrointestinal: soft, non-tender/non-distended. No guarding/reflex.  Musculoskeletal: M/S 5/5 throughout.  Extremities without ischemic changes.  No deformity or atrophy. No edema. Neurologic: Sensation grossly intact in extremities.  Symmetrical.  Speech is fluent. Motor exam as listed above. Psychiatric: Judgment intact, Mood & affect appropriate for pt's clinical situation. Dermatologic: No rashes or ulcers noted.  No cellulitis or open wounds. Lymph : No Cervical, Axillary, or Inguinal lymphadenopathy.  CBC Lab Results  Component Value Date   WBC 4.9 08/01/2021   HGB 10.8 (L) 08/01/2021   HCT 31.7 (L) 08/01/2021   MCV 102.9 (H) 08/01/2021   PLT 149 (L) 08/01/2021   BMET    Component Value Date/Time   NA 143 08/01/2021 0931   NA 140 01/19/2016 0000   NA 140 12/20/2014 1108   K 4.2 08/01/2021 0931   K 4.5 12/20/2014 1108   CL 106 08/01/2021 0931   CL 104 12/20/2014 1108   CO2 28 08/01/2021 0931   CO2 30 12/20/2014 1108   GLUCOSE 116 (H) 08/01/2021 0931   GLUCOSE 76 12/20/2014 1108   BUN 26 (H) 08/01/2021 0931   BUN 15 01/19/2016 0000   BUN 10 12/20/2014 1108   CREATININE 0.74 08/01/2021 0931   CREATININE 0.89 12/20/2014 1108   CREATININE 0.96 03/13/2012 1007   CALCIUM 9.1 08/01/2021 0931   CALCIUM 9.4 12/20/2014 1108   GFRNONAA >60 08/01/2021 0931   GFRNONAA >60 12/20/2014 1108   GFRNONAA >60  06/04/2012 1107   GFRNONAA 72 03/13/2012 1007   GFRAA >60 08/19/2020 0610   GFRAA >60 12/20/2014 1108   GFRAA >60 06/04/2012 1107   GFRAA 84 03/13/2012 1007   Estimated Creatinine Clearance: 47.1 mL/min (by C-G formula based on SCr of 0.74 mg/dL).  COAG Lab Results  Component Value Date   INR 2.2 (H) 08/01/2021   INR 1.9 (H) 06/30/2021   INR 3.5 (H) 05/17/2021   PROTIME 31.2 (A) 04/17/2016   PROTIME 33.7 (A) 08/03/2015   PROTIME 34.4 (A)  07/06/2015   Radiology CT Angio Abd/Pel W and/or Wo Contrast  Result Date: 08/01/2021 CLINICAL DATA:  GI bleeding. EXAM: CTA ABDOMEN AND PELVIS WITHOUT AND WITH CONTRAST TECHNIQUE: Multidetector CT imaging of the abdomen and pelvis was performed using the standard protocol during bolus administration of intravenous contrast. Multiplanar reconstructed images and MIPs were obtained and reviewed to evaluate the vascular anatomy. CONTRAST:  168m OMNIPAQUE IOHEXOL 350 MG/ML SOLN COMPARISON:  CT abdomen pelvis-06/30/2021; 11/22/2017; 06/12/2017; lumbar spine MRI-09/05/2020 FINDINGS: VASCULAR Aorta: Moderate amount eccentric mixed calcified and noncalcified atherosclerotic plaque scattered throughout a normal caliber abdominal aorta, not resulting in a hemodynamically significant stenosis. No evidence of abdominal aortic dissection or periaortic stranding. Celiac: There is a minimal amount of eccentric mixed calcified and noncalcified atherosclerotic plaque involving the origin the celiac artery, not resulting in a hemodynamically significant stenosis. Conventional branching pattern. SMA: There is a minimal amount of eccentric predominantly calcified atherosclerotic plaque involving the origin the SMA, not resulting in a hemodynamically significant stenosis. Conventional branching pattern. Renals: Duplicated nearly codominant right renal arteries; there is a minimal amount of eccentric mixed calcified and noncalcified atherosclerotic plaque involving the origin of  the left renal artery not definitely resulting in hemodynamically significant stenosis. No vessel irregularity to suggest FMD. IMA: Remains patent and likely serves as the arterial supply to the area of intraluminal contrast extravasation involving the distal descending/proximal sigmoid colon. Inflow: There is a minimal amount of eccentric mixed calcified and noncalcified atherosclerotic plaque involving the bilateral common iliac arteries, not resulting in hemodynamically significant stenosis. The bilateral common and external iliac arteries are noted to be markedly tortuous though without a hemodynamically significant narrowing. The bilateral internal iliac arteries are disease though patent of normal caliber. Proximal Outflow: The bilateral common and imaged portions of the bilateral deep and superficial femoral arteries are of normal caliber and widely patent without hemodynamically significant narrowing. Veins: The IVC and pelvic venous systems appear widely patent. Review of the MIP images confirms the above findings. _________________________________________________________ NON-VASCULAR Lower chest: Limited visualization of the lower thorax demonstrates a large hernia involving the medial aspect of the right hemidiaphragm previously noted to contain a large portion of stomach however now contains a lower to portion of the transverse colon, interval change compared to the 06/30/2021 examination though without definitive evidence of upstream colonic dilatation or gaseous distension to suggest superimposed volvulus. Cardiomegaly. Pacer lead terminates within the right ventricular apex. Post left atrial appendage clipping, incompletely evaluated. Post median sternotomy. Calcified granuloma noted within the lingula. Hepatobiliary: Normal hepatic contour. There is a minimal amount of focal fatty infiltration adjacent to the fissure for the ligamentum teres. No discrete hepatic lesions. Normal appearance of the  gallbladder given degree distention. No radiopaque gallstones. No intra extrahepatic bili duct dilatation. No ascites. Pancreas: Normal appearance of the pancreas. Spleen: Abnormal appearance of the spleen however the spleen is again noted to be displaced anteriorly secondary to the large hiatal hernia. Adrenals/Urinary Tract: There is symmetric enhancement and excretion of the bilateral kidneys. Redemonstrated bilateral renal cysts, the largest of which arising from the anterior inferior aspect of the left kidney measuring approximately 9.1 x 8.0 cm (image 25, series 2). No evidence of nephrolithiasis. No discrete worrisome renal lesions. No urinary obstruction or perinephric stranding. Normal appearance of the bilateral adrenal glands. Normal appearance of the urinary bladder given degree of distention. Stomach/Bowel: There is intraluminal contrast extravasation involving the distal aspect of the descending colon/proximal. Sigmoid colon within the left lower abdomen/upper pelvis (axial image  120, series 5 with increased pooling on the acquired delayed venous phase images (representative image 47, series 6; coronal images 57 through 69, series 14). As above, limited visualization of the lower thorax demonstrates a large hernia involving the medial aspect of the right hemidiaphragm previously noted to contain a large portion of stomach however now contains a lower to portion of the transverse colon, interval change compared to the 06/30/2021 examination though without definitive evidence of upstream colonic dilatation or gaseous distension to suggest superimposed volvulus. Large stool burden without evidence of enteric obstruction. No pneumoperitoneum, pneumatosis or portal venous gas. Lymphatic: No bulky retroperitoneal, mesenteric, pelvic or inguinal lymphadenopathy. Reproductive: Dystrophic calcifications within normal sized prostate gland. No free fluid the pelvic cul-de-sac. Other: Sequela bilateral inguinal  hernia repairs without evidence of recurrence. Musculoskeletal: No acute or aggressive osseous abnormalities. Severe (approximately 75%) compression deformity involving the superior endplate of 624THL and moderate (approximately 50%) compression deformity involving the superior endplate of L1 both appear similar to lumbar spine MRI performed 09/05/2020. Mild-to-moderate multilevel lumbar spine DDD, worse at L2-L3 and L4-L5 with disc space height loss, endplate irregularity and sclerosis. Moderate scoliotic curvature of the lumbar spine. Mild degenerative change of the bilateral hips with joint space loss, subchondral sclerosis and osteophytosis. IMPRESSION: 1. Examination is positive for acute intraluminal contrast extravasation within the distal descending/proximal sigmoid colon within the left lower abdomen/upper pelvis with arterial supply likely via the IMA distribution. 2. Large right-sided posteromedial diaphragmatic hernia now containing a large portion of the mid aspect of the transverse colon in addition to a large portion of the stomach, an interval progression compared to the 06/30/2021 examination though without evidence of volvulus or enteric obstruction. 3. Scattered atherosclerotic plaque within a tortuous but normal caliber abdominal aorta. 4. Moderate to severe T12 and L1 compression deformities, similar to the 09/05/2020 lumbar spine MRI. Critical Value/emergent results were called by telephone at the time of interpretation on 08/01/2021 at 1:56 pm to provider [facility] , who verbally acknowledged these results. Electronically Signed   By: Sandi Mariscal M.D.   On: 08/01/2021 14:09    Assessment/Plan The patient is a 87 year old male with multiple medical issues including dementia who presented to the [facility] emergency department with rectal bleeding  1.  Rectal Bleeding: Patient presents with bright red blood per rectum.  CTA completed in the emergency department was  notable for active extravasation in the rectum.  The patient has a mechanical heart valve and is on Coumadin.  INR today was 2.2.  Hemoglobin 10.8.  Patient had at least 2 bloody bowel movements continue large amount of blood in the emergency department.  I reached out to the patient's niece Kevin [name] [phone] who is his power of attorney.  We had a long discussion in regard to endovascular embolization and the pathophysiology of rectal bleeding in the setting of Coumadin administration for mechanical heart valves.  We discussed the procedure in detail and she feels that that the patient would want to move forward.  We did discuss if he continued to bleed afterward the possibility of colon resection.  The niece felt that the patient would not want this larger for surgery at his age.  At the end of the conversation, his niece gave verbal consent to undergo an embolization of his rectum and attempt to stop his bleeding.  We will plan on this emergently this afternoon with Dr. Delana Meyer.  2.  Dementia: Patient's name Kevin [name] who is listed in epic  is his power of attorney and has consented to move forward with an endovascular embolization and attempt to control rectal bleeding  3.  Mechanical heart valve On chronic anticoagulation with Coumadin Patient INR at presentation in the emergency department was 2.2 Receiving 1 dose of vitamin K for temporary reversal  Discussed with Dr. Francene Castle, PA-C 08/01/2021 3:12 PM  This note was created with Dragon medical transcription system.  Any error is purely unintentional.

## 2021-08-01 NOTE — Progress Notes (Addendum)
Results of CT angiogram showed acute intraluminal contrast extravasation within the distal descending/proximal sigmoid colon within the left lower abdomen/upper pelvis with arterial supply likely via the IMA distribution. Vascular surgery has been consulted by emergency room physician Patient now has copious rectal bleeding but blood pressure is still holding Will transfer patient to stepdown Aggressive IV fluid resuscitation Transfuse patient as needed For embolization by vascular surgery

## 2021-08-01 NOTE — Progress Notes (Signed)
Patient incontinent of large bright red bloody stool with large clot. Patient changed and repositioned at this time.

## 2021-08-01 NOTE — Progress Notes (Signed)
Patient belongings will be returned to patient  in ICU room from vascular lab per vascular department

## 2021-08-01 NOTE — ED Notes (Addendum)
Full linen change completed and Pt cleaned up.  Immediately following, Pt passed more blood clots and continued to bleed.  EDP made aware.

## 2021-08-01 NOTE — ED Notes (Signed)
Unsuccessful IV attempt x2.  Will ask another RN to attempt.  

## 2021-08-01 NOTE — Op Note (Signed)
[facility]  Percutaneous Study/Intervention Procedural Note     Surgeon(s): Mudlogger: none  Pre-operative Diagnosis: 1. Lower GI bleed 2.  Mechanical heart valve on anticoagulation  Post-operative diagnosis:  Same  Procedure(s) Performed:             1.  Ultrasound guidance for vascular access right femoral artery             2.  Catheter placement into third order branches of the inferior mesenteric artery             3.  Aortogram and selective angiogram of the inferior mesenteric artery             4.  Microbead embolization of inferior mesenteric artery with 500-700  polyvinyl alcohol beads, total volume 1.5 cc.             5.  StarClose closure device right femoral artery  Anesthesia: General         EBL: 5 cc  Fluoro Time: 3.6 minutes  Contrast: 35 cc              Indications:  Patient is a 87 y.o.male with brisk lower GI bleeding with resultant anemia. The patient has a nuclear medicine study showing bleeding originating from the sigmoid colon distribution. The patient is brought in for angiography for further evaluation and potential treatment. Risks and benefits are discussed and informed consent is obtained  Procedure:  The patient was identified and appropriate procedural time out was performed.  The patient was then placed supine on the table and prepped and draped in the usual sterile fashion. Moderate conscious sedation was administered during a face to face encounter with the patient throughout the procedure with my supervision of the RN administering medicines and monitoring the patient's vital signs, pulse oximetry, telemetry and mental status throughout from the start of the procedure until the patient was taken to the recovery room.  Ultrasound was used to evaluate the right common femoral artery.  It was patent .  A digital ultrasound image was acquired.  A Seldinger needle was used to access the right common femoral  artery under direct ultrasound guidance and a permanent image was performed.  A 0.035 J wire was advanced without resistance and a 5Fr sheath was placed.  Pigtail catheter was placed into the aorta and an RAO image of the distal aorta was performed. This demonstrated the origin of the IMA quite nicely.   A VS 1 catheter was used to selectively cannulate the IMA.  This demonstrated possible bleeding point off the sigmoid branch of the superior rectal artery. Based on his continued bleeding and the nuclear medicine study I elected to treat this area with embolization. I initially advanced the Pro-Great microcatheter out the superior rectal artery as well as into sigmoid branches.  I instilled approximately 1-1/2 cc of medium sized PVC beads in this location. Angiogram following this showed the main vessels to be open with less brisk filling.  I elected to terminate the procedure. The diagnostic catheter was removed. StarClose closure device was deployed in usual fashion with excellent hemostatic result. The patient was taken to the recovery room in stable condition having tolerated the procedure well.     Findings: Abdominal aorta is diffusely diseased and extensively calcified without hemodynamically significant stenosis.  The IMA is identified in the RAO projection.  IMA on selective injection fills the sigmoid branches as well as the left colic and  the superior rectal.  Using appropriate I cannulated 2 of the sigmoid branches as well as the superior rectal.  With the prograde in the more proximal IMA I instilled 1-1/2 cc of beads in the follow-up imaging demonstrated a significant reduction in distal filling but preservation of all the large arteries  Disposition: Patient was taken to the recovery room in stable condition having tolerated the procedure well.  Complications:  None  Hortencia Pilar 08/01/2021 5:35 PM   This note was created with Dragon Medical transcription system. Any errors in  dictation are purely unintentional.

## 2021-08-01 NOTE — Transfer of Care (Signed)
Immediate Anesthesia Transfer of Care Note  Patient: Kevin [name]  Procedure(s) Performed: EMBOLIZATION  Patient Location: PACU  Anesthesia Type:General  Level of Consciousness: confused  Airway & Oxygen Therapy: Patient Spontanous Breathing and Patient connected to face mask oxygen  Post-op Assessment: Post -op Vital signs reviewed and stable  Post vital signs: stable  Last Vitals:  Vitals Value Taken Time  BP    Temp 36.2 C 08/01/21 1738  Pulse 68 08/01/21 1738  Resp 19   SpO2 97     Last Pain:  Vitals:   08/01/21 1615  TempSrc: Axillary         Complications: No notable events documented.

## 2021-08-01 NOTE — Anesthesia Preprocedure Evaluation (Signed)
Anesthesia Evaluation  Patient identified by MRN, date of birth, ID band Patient confused  General Assessment Comment:  Patient delirious , fighting [place]  Reviewed: Allergy & Precautions, NPO status , Patient's Chart, lab work & pertinent test results  History of Anesthesia Complications Negative for: history of anesthetic complications  Airway Mallampati: III  TM Distance: >3 FB Neck ROM: Full   Comment: Not cooperative with airway exam Dental  (+) Missing   Pulmonary neg pulmonary ROS, neg sleep apnea, neg COPD, Patient abstained from smoking.Not current smoker,    Pulmonary exam normal breath sounds clear to auscultation       Cardiovascular Exercise Tolerance: Poor METS: < 3 Mets hypertension, + CAD and +CHF  (-) Past MI (-) dysrhythmias + pacemaker + Valvular Problems/Murmurs MR  Rhythm:Irregular Rate:Normal - Systolic murmurs INTERPRETATION  NORMAL LEFT VENTRICULAR SYSTOLIC FUNCTION WITH AN ESTIMATED EF = 50-55%  NORMAL RIGHT VENTRICULAR SYSTOLIC FUNCTION  MODERATE TRICUSPID AND MITRAL VALVE INSUFFICIENCY  NO VALVULAR STENOSIS  MODERATE BIATRIAL ENLARGEMENT  MILD RV ENLARGEMENT  PACER WIRE NOTED  MECHANICAL MITRAL VALVE APPEARS TO BE WELL-SEATED AND FUNCTIONING NORMALLY     Neuro/Psych  Headaches, PSYCHIATRIC DISORDERS Depression Dementia    GI/Hepatic GERD  ,(+)     (-) substance abuse  ,   Endo/Other  neg diabetes  Renal/GU negative Renal ROS     Musculoskeletal   Abdominal   Peds  Hematology   Anesthesia Other Findings Past Medical History: No date: Atrial fibrillation (HCC) No date: Cancer ([place])     Comment:  melanoma- right ear No date: Cardiomyopathy, secondary ([place]) started age 34: Chronic headaches No date: Congestive heart failure (CHF) ([place]) 10/18/2018: Coronary artery disease involving autologous vein  coronary bypass graft with angina pectoris (HCC) No date: Coronary atherosclerosis of  native coronary artery No date: DJD (degenerative joint disease) No date: Essential hypertension, benign No date: GERD (gastroesophageal reflux disease) No date: Glaucoma No date: History of mitral valve replacement with mechanical valve No date: HOH (hard of hearing) No date: Hyperlipidemia, unspecified No date: Hypertension No date: Lumbago No date: Osteoporosis     Comment:  secondary to low testoerone No date: S/P MVR (mitral valve replacement) No date: Sciatica No date: Scoliosis  Reproductive/Obstetrics                             Anesthesia Physical Anesthesia Plan  ASA: 4 and emergent  Anesthesia Plan: General   Post-op Pain Management:    Induction: Intravenous and Rapid sequence  PONV Risk Score and Plan: 2 and Ondansetron, Dexamethasone and Treatment may vary due to age or medical condition  Airway Management Planned: Oral ETT and Video Laryngoscope Planned  Additional Equipment: None  Intra-op Plan:   Post-operative Plan: Extubation in OR and Possible Post-op intubation/ventilation  Informed Consent: I have reviewed the patients History and Physical, chart, labs and discussed the procedure including the risks, benefits and alternatives for the proposed anesthesia with the patient or authorized representative who has indicated his/her understanding and acceptance.     Dental advisory given, History available from chart only and Consent reviewed with [place] Discussed with: CRNA and Surgeon  Anesthesia Plan Comments: (Discussed risks of anesthesia with patient's niece/POA Chrissie Noa by phone, including PONV, sore throat, lip/dental damage. Rare risks discussed as well, such as cardiorespiratory and neurological sequelae, and allergic reactions and death. She wishes to suspend DNR perioperatively. OK with blood transfusion if needed.)  Anesthesia Quick Evaluation  

## 2021-08-01 NOTE — ED Notes (Signed)
Pt noted to be increasingly agitated.  Unable to comfort.  Pt's brief checked and moderate bloody stool w/ many clots noted.  EDP made aware.

## 2021-08-01 NOTE — Anesthesia Procedure Notes (Signed)
Procedure Name: Intubation Date/Time: 08/01/2021 4:51 PM Performed by: Aline Brochure, CRNA Pre-anesthesia Checklist: Patient identified, Patient being monitored, Timeout performed, Emergency Drugs available and Suction available Patient Re-evaluated:Patient Re-evaluated prior to induction Oxygen Delivery Method: Circle system utilized Preoxygenation: Pre-oxygenation with 100% oxygen Induction Type: IV induction Ventilation: Mask ventilation without difficulty Laryngoscope Size: McGraph and 4 Grade View: Grade I Tube type: Oral Tube size: 7.5 mm Number of attempts: 1 Airway Equipment and Method: Stylet and Video-laryngoscopy Placement Confirmation: ETT inserted through vocal cords under direct vision, positive ETCO2 and breath sounds checked- equal and bilateral Secured at: 21 cm Tube secured with: Tape Dental Injury: Teeth and Oropharynx as per pre-operative assessment

## 2021-08-01 NOTE — ED Provider Notes (Addendum)
[facility] Emergency Department Provider Note   ____________________________________________   Event Date/Time   First MD Initiated Contact with Patient 08/01/21 1034     (approximate)  I have reviewed the triage vital signs and the nursing notes.   HISTORY  Chief Complaint GI Bleeding    HPI Kevin [name] is a 87 y.o. male with past medical history of hypertension, hyperlipidemia, CAD, atrial fibrillation, mechanical mitral valve on Coumadin, and CHF who presents to the ED complaining of bloody stool.  History is limited as patient is extremely hard of hearing, is not sure why he is here.  Per EMS, staff at patient's nursing facility noticed this morning that he was having multiple bloody bowel movements.  Patient is not sure if he has been bleeding, denies any pain in his abdomen or rectum.  EMS states that patient has not had any nausea or vomiting.        Past Medical History:  Diagnosis Date   Atrial fibrillation ([place])    Cancer ([place])    melanoma- right ear   Cardiomyopathy, secondary ([place])    Chronic headaches started age 63   Congestive heart failure (CHF) ([place])    Coronary artery disease involving autologous vein coronary bypass graft with angina pectoris ([place]) 10/18/2018   Coronary atherosclerosis of native coronary artery    DJD (degenerative joint disease)    Essential hypertension, benign    GERD (gastroesophageal reflux disease)    Glaucoma    History of mitral valve replacement with mechanical valve    HOH (hard of hearing)    Hyperlipidemia, unspecified    Hypertension    Lumbago    Osteoporosis    secondary to low testoerone   S/P MVR (mitral valve replacement)    Sciatica    Scoliosis     Patient Active Problem List   Diagnosis Date Noted   Rectal bleeding 08/01/2021   Chronic diastolic CHF (congestive heart failure) ([place]) 08/01/2021   Uncontrolled hypertension 05/25/2019   Insomnia 05/25/2019   Pacemaker 01/24/2019    Chronic systolic heart failure ([place]) 10/18/2018   Hypertensive heart disease with systolic congestive heart failure ([place]) 10/18/2018   Coronary artery disease involving autologous vein coronary bypass graft with angina pectoris ([place]) 10/18/2018   Chronic idiopathic constipation 10/18/2018   Chronic angle-closure glaucoma, bilateral 10/18/2018   Right leg pain 09/15/2018   Cognitive decline 06/19/2018   Protein-calorie malnutrition ([place]) 06/09/2018   Primary cancer of skin of forehead 05/27/2018   Jaw hematoma 05/14/2018   Ringworm of foot 04/15/2018   Hyponatremia 03/08/2018   Benign prostatic hyperplasia with urinary hesitancy 02/21/2018   Constipation 12/12/2017   Large hiatal hernia 12/12/2017   Encounter for preventive health examination 03/30/2017   Generalized muscle weakness 07/24/2016   Major depressive disorder, recurrent episode ([place]) 01/24/2016   Vitamin D deficiency 01/24/2016   Fatigue 01/23/2016   Benign essential HTN 03/04/2015   GERD without esophagitis 03/04/2015   H/O prosthetic heart valve 03/04/2015   Dizziness 02/21/2015   Cardiomyopathy ([place]) 03/09/2014   Hyperlipidemia 04/27/2013   Sciatica of right side 03/16/2013   Anemia, iron deficiency 09/15/2012   Osteoporosis, senile 09/15/2012   Atrial flutter ([place]) 06/26/2012   ABLA (acute blood loss anemia) 06/14/2012   Depression 12/24/2011   Neuropathy of lower extremity 08/25/2011   Atrial fibrillation ([place])    History of mitral valve replacement with mechanical valve    Long term current use of anticoagulants with INR goal of 2.5-3.5  08/22/2011    Past Surgical History:  Procedure Laterality Date   CARDIOVERSION  2013   CATARACT EXTRACTION, BILATERAL     x 2   CORONARY ARTERY BYPASS GRAFT  12/2014   coronary artery bypass w/arterial grafts    HERNIA REPAIR     x 2   MELANOMA EXCISION     right ear   MITRAL VALVE REPLACEMENT  1994   [facility]   NASAL SEPTUM SURGERY  1963   PERMANENT PACEMAKER  INSERTION Left 12/22/2014    Prior to Admission medications   Medication Sig Start Date End Date Taking? Authorizing Provider  amLODipine (NORVASC) 5 MG tablet Take 5 mg by mouth daily. 06/14/21  Yes [provider]  brimonidine (ALPHAGAN) 0.15 % ophthalmic solution Place 1 drop into both eyes 2 (two) times daily.  08/21/11  Yes [provider]  Cholecalciferol (VITAMIN D) 50 MCG (2000 UT) tablet Take 2,000 Units by mouth daily. 10/14/18  Yes [provider]  Coenzyme Q10 10 MG capsule Take 100 mg by mouth 4 (four) times daily. 10/14/18  Yes [provider]  diclofenac Sodium (VOLTAREN) 1 % GEL Apply 4 g topically 4 (four) times daily. For left knee pain 05/26/20  Yes [provider]  docusate sodium (COLACE) 100 MG capsule Take 2 capsules (200 mg total) by mouth at bedtime. 03/06/18  Yes Crecencio Mc, MD  furosemide (LASIX) 20 MG tablet Take 1 tablet (20 mg total) by mouth daily. 06/18/17  Yes Fritzi Mandes, MD  latanoprost (XALATAN) 0.005 % ophthalmic solution Place 1 drop into both eyes at bedtime.  08/13/11  Yes [provider]  lidocaine (LIDODERM) 5 % Place 1 patch onto the skin daily. Remove & Discard patch within 12 hours or as directed by MD   Yes [provider]  losartan (COZAAR) 100 MG tablet TAKE 1 TABLET BY MOUTH DAILY 12/23/18  Yes Crecencio Mc, MD  Melatonin 3 MG TABS Take 1 tablet by mouth at bedtime.  09/28/18  Yes [provider]  metoprolol tartrate (LOPRESSOR) 25 MG tablet Take 25 mg by mouth 2 (two) times daily. Hold for BP less than 100/'s or pulse less than 60 09/28/18  Yes [provider]  mirtazapine (REMERON) 15 MG tablet Take 7.5 mg by mouth at bedtime. 06/26/21  Yes [provider]  niacinamide 500 MG tablet Take 500 mg by mouth daily. 10/14/18  Yes [provider]  omeprazole (PRILOSEC) 40 MG capsule Take 40 mg by mouth 2 (two) times daily.   Yes [provider]   polyethylene glycol (MIRALAX / GLYCOLAX) 17 g packet Take 17 g by mouth daily.   Yes [provider]  tamsulosin (FLOMAX) 0.4 MG CAPS capsule TAKE 1 CAPSULE BY MOUTH EVERY DAY 08/28/18  Yes Crecencio Mc, MD  traZODone (DESYREL) 50 MG tablet Take 50 mg by mouth at bedtime. 06/18/21  Yes [provider]  warfarin (COUMADIN) 3 MG tablet Take 3 mg by mouth daily.   Yes [provider]  acetaminophen (TYLENOL) 325 MG tablet Take 650 mg by mouth every 4 (four) hours as needed for fever. Maximum dose for 24 hours is 3000 mg from all sources of Acetaminophen/tylenol    [provider]  ferrous sulfate 325 (65 FE) MG tablet Take 1 tablet by mouth every other day.  06/13/12   Crecencio Mc, MD  loperamide (IMODIUM) 2 MG capsule Take 2-4 mg by mouth as needed for diarrhea or loose  stools. Take 2 tablets to = 4 mg for initial loose stool, take 1 tablet to = 2 mg for each subsequent loose stool for up to 8 doses/24 hours Patient not taking: No sig reported    [provider]  meclizine (ANTIVERT) 25 MG tablet Take 25 mg by mouth 3 (three) times daily as needed for dizziness. Patient not taking: No sig reported    [provider]  NON FORMULARY Diet Type: regular    [provider]  Nutritional Supplements (ENSURE ENLIVE PO) Take 0.08 g by mouth every other day.    [provider]  ondansetron (ZOFRAN-ODT) 4 MG disintegrating tablet Take 4 mg by mouth every 4 (four) hours as needed for nausea or vomiting. Patient not taking: No sig reported 10/01/18   [provider]  warfarin (COUMADIN) 1 MG tablet Take 0.5 mg by mouth daily. Patient not taking: Reported on 08/01/2021    [provider]  warfarin (COUMADIN) 4 MG tablet Take 4 mg by mouth daily. Patient not taking: No sig reported    [provider]  amitriptyline (ELAVIL) 25 MG tablet Take 1 tablet (25 mg total) by mouth at bedtime. 09/21/11 03/04/12  Crecencio Mc, MD    Allergies Aspirin and Cymbalta [duloxetine hcl]  Family History  Problem Relation Age of Onset   Cancer Father        unknown   Alzheimer's disease Brother    Arrhythmia Brother    Mental illness Mother     Social History Social History   Tobacco Use   Smoking status: Never   Smokeless tobacco: Never  Vaping Use   Vaping Use: Never used  Substance Use Topics   Alcohol use: No   Drug use: No    Review of Systems  Constitutional: No fever/chills Eyes: No visual changes. ENT: No sore throat. Cardiovascular: Denies chest pain. Respiratory: Denies shortness of breath. Gastrointestinal: No abdominal pain.  No nausea, no vomiting.  No diarrhea.  No constipation.  Positive for bloody stool. Genitourinary: Negative for dysuria. Musculoskeletal: Negative for back pain. Skin: Negative for rash. Neurological: Negative for headaches, focal weakness or numbness.  ____________________________________________   PHYSICAL EXAM:  VITAL SIGNS: ED Triage Vitals [08/01/21 0949]  Enc Vitals Group     BP (!) 159/65     Pulse Rate 62     Resp 20     Temp 98.8 F (37.1 C)     Temp Source Oral     SpO2 97 %     Weight 130 lb (59 kg)     Height '5\' 7"'$  (1.702 m)     Head Circumference      Peak Flow      Pain Score      Pain Loc      Pain Edu?      Excl. in [place]?     Constitutional: Alert and oriented. Eyes: Conjunctivae are normal. Head: Atraumatic. Nose: No congestion/rhinnorhea. Mouth/Throat: Mucous membranes are dry. Neck: Normal ROM Cardiovascular: Normal rate, regular rhythm. Grossly normal heart sounds.  2+ radial pulses bilaterally. Respiratory: Normal respiratory effort.  No retractions. Lungs CTAB. Gastrointestinal: Soft and nontender. No distention.  Gross blood noted with rectal examination. Genitourinary: deferred Musculoskeletal: No lower extremity tenderness nor edema. Neurologic:  Normal speech and language. No gross focal neurologic  deficits are appreciated. Skin:  Skin is warm, dry and intact. No rash noted. Psychiatric: Mood and affect are normal. Speech and behavior are normal.  ____________________________________________  LABS (all labs ordered are listed, but only abnormal results are displayed)  Labs Reviewed  COMPREHENSIVE METABOLIC PANEL - Abnormal; Notable for the following components:      Result Value   Glucose, Bld 116 (*)    BUN 26 (*)    All other components within normal limits  CBC - Abnormal; Notable for the following components:   RBC 3.08 (*)    Hemoglobin 10.8 (*)    HCT 31.7 (*)    MCV 102.9 (*)    MCH 35.1 (*)    Platelets 149 (*)    All other components within normal limits  PROTIME-INR - Abnormal; Notable for the following components:   Prothrombin Time 24.7 (*)    INR 2.2 (*)    All other components within normal limits  RESP PANEL BY RT-PCR (FLU A&B, COVID) ARPGX2  HEMOGLOBIN AND HEMATOCRIT, BLOOD  POC OCCULT BLOOD, ED  TYPE AND SCREEN    PROCEDURES  Procedure(s) performed (including Critical Care):  .Critical Care  Date/Time: 08/01/2021 2:18 PM Performed by: Blake Divine, MD Authorized by: Blake Divine, MD   Critical care provider statement:    Critical care time (minutes):  45   Critical care time was exclusive of:  Separately billable procedures and treating other patients and teaching time   Critical care was necessary to treat or prevent imminent or life-threatening deterioration of the following conditions:  Circulatory failure   Critical care was time spent personally by me on the following activities:  Discussions with consultants, evaluation of patient's response to treatment, examination of patient, ordering and performing treatments and interventions, ordering and review of laboratory studies, ordering and review of radiographic studies, pulse oximetry, re-evaluation of patient's condition, obtaining history from patient or surrogate and review of old  charts   I assumed direction of critical care for this patient from another provider in my specialty: no     Care discussed with: admitting provider     ____________________________________________   INITIAL IMPRESSION / ASSESSMENT AND PLAN / ED COURSE      87 year old male with past medical history of hypertension, hyperlipidemia, CAD, atrial fibrillation, mechanical heart valve on Coumadin, and CHF who presents to the ED with bloody stool starting this morning.  Patient does not appear in any discomfort and has no abdominal tenderness on exam, however gross blood noted at his rectum.  He remains hemodynamically stable, hemoglobin slightly downtrending from previous.  We will hydrate with IV fluids but hold off on transfusion for now unless patient noted to have worsening vital signs or anemia.  Plan to discuss with hospitalist for admission.  CTA of abdomen and pelvis performed following discussion with Dr. Allen Norris of GI.  This shows active extravasation in the area of the sigmoid colon along with involvement of transverse colon in his hiatal hernia.  Findings discussed with Dr. Lucky Cowboy of vascular surgery, who will attempt embolization this afternoon.  Given patient's mechanical heart valve, case also discussed with Dr. Clayborn Bigness of cardiology, who recommends holding off on reversal of Coumadin unless embolization were to be unsuccessful.  Case discussed with Dr. Francine Graven of hospitalist service for admission.  ----------------------------------------- 2:35 PM on 08/01/2021 ----------------------------------------- Patient noted to have increased bleeding per rectum, now passing large clots.  He remains hemodynamically stable, we will crossmatch for 2 units PRBCs but hold off on transfusion.  Case discussed with Dr. Mortimer Fries in the ICU, who recommends admission to stepdown and ICU team will evaluate following embolization.  ____________________________________________   FINAL CLINICAL  IMPRESSION(S) / ED DIAGNOSES  Final diagnoses:  Lower GI bleed  Mechanical heart valve present  Anticoagulated on Coumadin     ED Discharge Orders     None        Note:  This document was prepared using Dragon voice recognition software and may include unintentional dictation errors.    Blake Divine, MD 08/01/21 1152    Blake Divine, MD 08/01/21 1418    Blake Divine, MD 08/01/21 1418    Blake Divine, MD 08/01/21 1435

## 2021-08-01 NOTE — ED Triage Notes (Addendum)
Pt comes into the ED via EMS from [place], c/o blood in his stools this morning that they were not able to get under control.. HOH./ no really able to answer, denies pain.  163/85 HR83 96%RA

## 2021-08-01 NOTE — H&P (View-Only) (Signed)
Kevin [name]  MRN : [id number]  Kevin [name] is a 87 y.o. (1925/02/21) male who presents with chief complaint of  Chief Complaint  Patient presents with   GI Bleeding   History of Present Illness:  Of [name]: The patient does have a past medical history of dementia and is unable to contribute to his history.  Information for this consultation was taken from previous epic notation and speaking to his niece.  Kevin [name] is a 87 y.o. male with medical history significant for hypertension, dyslipidemia, coronary artery disease, atrial fibrillation, mechanical valve on chronic anticoagulation therapy with Coumadin and chronic diastolic dysfunction CHF who was sent to the ER via EMS for evaluation of rectal bleeding.  The staff at the skilled nursing facility noted that he had bowel movements containing blood and so EMS was called.  ER provider did a rectal exam and noticed bright red blood around the rectal area.  Labs show sodium 143, potassium 4.2, chloride 106, bicarb 28, glucose 116, BUN 26, creatinine 0.74, calcium 9.1, alkaline phosphatase 69, albumin 3.9, AST 21, ALT 13, total protein 6.5, white count 4.9, hemoglobin 10.8, hematocrit 39.7, MCV 102.9, RDW 12.8, platelet count 149, PT 24.7, INR 2.2,  CT of the abdomen pelvis was notable for active extravasation in the rectum.  Patient had at least 2 bloody bowel movements in the emergency department.  Vascular surgery was consulted by Dr. Francine Graven for possible endovascular intervention in the setting of rectal bleeding  Current Facility-Administered Medications  Medication Dose Route Frequency Provider Last Rate Last Admin   0.9 %  sodium chloride infusion   Intravenous Continuous Agbata, Tochukwu, MD       0.9 %  sodium chloride infusion   Intravenous Once Agbata, Tochukwu, MD       acetaminophen (TYLENOL) tablet 650 mg  650 mg Oral Q4H PRN Agbata, Tochukwu, MD       brimonidine (ALPHAGAN)  0.15 % ophthalmic solution 1 drop  1 drop Both Eyes BID Agbata, Tochukwu, MD       cholecalciferol (VITAMIN D3) tablet 2,000 Units  2,000 Units Oral Daily Agbata, Tochukwu, MD       ferrous sulfate tablet 325 mg  325 mg Oral QODAY Agbata, Tochukwu, MD       latanoprost (XALATAN) 0.005 % ophthalmic solution 1 drop  1 drop Both Eyes QHS Agbata, Tochukwu, MD       melatonin tablet 2.5 mg  2.5 mg Oral QHS Agbata, Tochukwu, MD       mirtazapine (REMERON) tablet 7.5 mg  7.5 mg Oral QHS Agbata, Tochukwu, MD       niacin (SLO-NIACIN) CR tablet 500 mg  500 mg Oral Daily Agbata, Tochukwu, MD       ondansetron (ZOFRAN) tablet 4 mg  4 mg Oral Q6H PRN Agbata, Tochukwu, MD       Or   ondansetron (ZOFRAN) injection 4 mg  4 mg Intravenous Q6H PRN Agbata, Tochukwu, MD       pantoprazole (PROTONIX) injection 40 mg  40 mg Intravenous Q24H Agbata, Tochukwu, MD   40 mg at 08/01/21 1500   phytonadione (VITAMIN K) SQ injection 5 mg  5 mg Subcutaneous Once Kevin Divine, MD       tamsulosin (FLOMAX) capsule 0.4 mg  0.4 mg Oral Daily Agbata, Tochukwu, MD       traZODone (DESYREL) tablet 50 mg  50 mg Oral QHS Collier Bullock, MD  Current Outpatient Medications  Medication Sig Dispense Refill   amLODipine (NORVASC) 5 MG tablet Take 5 mg by mouth daily.     brimonidine (ALPHAGAN) 0.15 % ophthalmic solution Place 1 drop into both eyes 2 (two) times daily.      Cholecalciferol (VITAMIN D) 50 MCG (2000 UT) tablet Take 2,000 Units by mouth daily.     Coenzyme Q10 10 MG capsule Take 100 mg by mouth 4 (four) times daily.     diclofenac Sodium (VOLTAREN) 1 % GEL Apply 4 g topically 4 (four) times daily. For left knee pain     docusate sodium (COLACE) 100 MG capsule Take 2 capsules (200 mg total) by mouth at bedtime. 60 capsule 5   furosemide (LASIX) 20 MG tablet Take 1 tablet (20 mg total) by mouth daily. 30 tablet 2   latanoprost (XALATAN) 0.005 % ophthalmic solution Place 1 drop into both eyes at bedtime.       lidocaine (LIDODERM) 5 % Place 1 patch onto the skin daily. Remove & Discard patch within 12 hours or as directed by MD     losartan (COZAAR) 100 MG tablet TAKE 1 TABLET BY MOUTH DAILY 90 tablet 0   Melatonin 3 MG TABS Take 1 tablet by mouth at bedtime.      metoprolol tartrate (LOPRESSOR) 25 MG tablet Take 25 mg by mouth 2 (two) times daily. Hold for BP less than 100/'s or pulse less than 60     mirtazapine (REMERON) 15 MG tablet Take 7.5 mg by mouth at bedtime.     niacinamide 500 MG tablet Take 500 mg by mouth daily.     omeprazole (PRILOSEC) 40 MG capsule Take 40 mg by mouth 2 (two) times daily.     polyethylene glycol (MIRALAX / GLYCOLAX) 17 g packet Take 17 g by mouth daily.     tamsulosin (FLOMAX) 0.4 MG CAPS capsule TAKE 1 CAPSULE BY MOUTH EVERY DAY 30 capsule 11   traZODone (DESYREL) 50 MG tablet Take 50 mg by mouth at bedtime.     warfarin (COUMADIN) 3 MG tablet Take 3 mg by mouth daily.     acetaminophen (TYLENOL) 325 MG tablet Take 650 mg by mouth every 4 (four) hours as needed for fever. Maximum dose for 24 hours is 3000 mg from all sources of Acetaminophen/tylenol     ferrous sulfate 325 (65 FE) MG tablet Take 1 tablet by mouth every other day.      loperamide (IMODIUM) 2 MG capsule Take 2-4 mg by mouth as needed for diarrhea or loose stools. Take 2 tablets to = 4 mg for initial loose stool, take 1 tablet to = 2 mg for each subsequent loose stool for up to 8 doses/24 hours (Patient not taking: No sig reported)     meclizine (ANTIVERT) 25 MG tablet Take 25 mg by mouth 3 (three) times daily as needed for dizziness. (Patient not taking: No sig reported)     NON FORMULARY Diet Type: regular     Nutritional Supplements (ENSURE ENLIVE PO) Take 0.08 g by mouth every other day.     ondansetron (ZOFRAN-ODT) 4 MG disintegrating tablet Take 4 mg by mouth every 4 (four) hours as needed for nausea or vomiting. (Patient not taking: No sig reported)     warfarin (COUMADIN) 1 MG tablet Take 0.5 mg by  mouth daily. (Patient not taking: Reported on 08/01/2021)     warfarin (COUMADIN) 4 MG tablet Take 4 mg by mouth daily. (Patient not taking:  No sig reported)     Past Medical History:  Diagnosis Date   Atrial fibrillation ([place])    Cancer (HCC)    melanoma- right ear   Cardiomyopathy, secondary ([place])    Chronic headaches started age 43   Congestive heart failure (CHF) (HCC)    Coronary artery disease involving autologous vein coronary bypass graft with angina pectoris ([place]) 10/18/2018   Coronary atherosclerosis of native coronary artery    DJD (degenerative joint disease)    Essential hypertension, benign    GERD (gastroesophageal reflux disease)    Glaucoma    History of mitral valve replacement with mechanical valve    HOH (hard of hearing)    Hyperlipidemia, unspecified    Hypertension    Lumbago    Osteoporosis    secondary to low testoerone   S/P MVR (mitral valve replacement)    Sciatica    Scoliosis    Past Surgical History:  Procedure Laterality Date   CARDIOVERSION  2013   CATARACT EXTRACTION, BILATERAL     x 2   CORONARY ARTERY BYPASS GRAFT  12/2014   coronary artery bypass w/arterial grafts    HERNIA REPAIR     x 2   MELANOMA EXCISION     right ear   MITRAL VALVE REPLACEMENT  1994   [facility]   PERMANENT PACEMAKER INSERTION Left 12/22/2014   Social History Social History   Tobacco Use   Smoking status: Never   Smokeless tobacco: Never  Vaping Use   Vaping Use: Never used  Substance Use Topics   Alcohol use: No   Drug use: No   Family History Family History  Problem Relation Age of Onset   Cancer Father        unknown   Alzheimer's disease Brother    Arrhythmia Brother    Mental illness Mother   Unable to add any additional information as the patient has a past medical history of dementia and unable to provide a history  Allergies  Allergen Reactions   Aspirin     Heart operation was told to never take Aspirin    Cymbalta [Duloxetine Hcl] Other (See Comments)    Urinary retention, constipation and insomnia   REVIEW OF SYSTEMS (Negative unless checked)  Unable to add any additional information as the patient has a past medical history of dementia and unable to provide a history  Constitutional: '[]'$ Weight loss  '[]'$ Fever  '[]'$ Chills Cardiac: '[]'$ Chest pain   '[]'$ Chest pressure   '[]'$ Palpitations   '[]'$ Shortness of breath when laying flat   '[]'$ Shortness of breath at rest   '[]'$ Shortness of breath with exertion. Vascular:  '[]'$ Pain in legs with walking   '[]'$ Pain in legs at rest   '[]'$ Pain in legs when laying flat   '[]'$ Claudication   '[]'$ Pain in feet when walking  '[]'$ Pain in feet at rest  '[]'$ Pain in feet when laying flat   '[]'$ History of DVT   '[]'$ Phlebitis   '[]'$ Swelling in legs   '[]'$ Varicose veins   '[]'$ Non-healing ulcers Pulmonary:   '[]'$ Uses home oxygen   '[]'$ Productive cough   '[]'$ Hemoptysis   '[]'$ Wheeze  '[]'$ COPD   '[]'$ Asthma Neurologic:  '[]'$ Dizziness  '[]'$ Blackouts   '[]'$ Seizures   '[]'$ History of stroke   '[]'$ History of TIA  '[]'$ Aphasia   '[]'$ Temporary blindness   '[]'$ Dysphagia   '[]'$ Weakness or numbness in arms   '[]'$ Weakness or numbness in legs Musculoskeletal:  '[]'$ Arthritis   '[]'$ Joint swelling   '[]'$ Joint pain   '[]'$ Low back  pain Hematologic:  '[]'$ Easy bruising  '[]'$ Easy bleeding   '[]'$ Hypercoagulable state   '[]'$ Anemic  '[]'$ Hepatitis Gastrointestinal:  '[]'$ Blood in stool   '[]'$ Vomiting blood  '[]'$ Gastroesophageal reflux/heartburn   '[]'$ Difficulty swallowing. Genitourinary:  '[]'$ Chronic kidney disease   '[]'$ Difficult urination  '[]'$ Frequent urination  '[]'$ Burning with urination   '[]'$ Blood in urine Skin:  '[]'$ Rashes   '[]'$ Ulcers   '[]'$ Wounds Psychological:  '[]'$ History of anxiety   '[]'$  History of major depression.  Physical Examination  Vitals:   08/01/21 1330 08/01/21 1400 08/01/21 1445 08/01/21 1500  BP: (!) 167/70 (!) 140/122 136/70 131/88  Pulse: 67 86 82 78  Resp: 14 20 (!) 23 17  Temp:      TempSrc:      SpO2: 100% 96% 100% 100%  Weight:      Height:       Body mass index is 20.36  kg/m. Gen: Patient is visibly agitated.  Patient with dementia very hard of hearing.  No acute distress. Head: [place]/AT, No temporalis wasting. Prominent temp pulse not noted. Ear/Nose/Throat: Hearing grossly intact, nares w/o erythema or drainage, oropharynx w/o Erythema/Exudate Eyes: Sclera non-icteric, conjunctiva clear Neck: Trachea midline.  No JVD.  Pulmonary:  Good air movement, respirations not labored, equal bilaterally.  Cardiac: RRR, normal S1, S2. Vascular:  Vessel Right Left  Radial Palpable Palpable  Ulnar Palpable Palpable                               Gastrointestinal: soft, non-tender/non-distended. No guarding/reflex.  Musculoskeletal: M/S 5/5 throughout.  Extremities without ischemic changes.  No deformity or atrophy. No edema. Neurologic: Sensation grossly intact in extremities.  Symmetrical.  Speech is fluent. Motor exam as listed above. Psychiatric: Judgment intact, Mood & affect appropriate for pt's clinical situation. Dermatologic: No rashes or ulcers noted.  No cellulitis or open wounds. Lymph : No Cervical, Axillary, or Inguinal lymphadenopathy.  CBC Lab Results  Component Value Date   WBC 4.9 08/01/2021   HGB 10.8 (L) 08/01/2021   HCT 31.7 (L) 08/01/2021   MCV 102.9 (H) 08/01/2021   PLT 149 (L) 08/01/2021   BMET    Component Value Date/Time   NA 143 08/01/2021 0931   NA 140 01/19/2016 0000   NA 140 12/20/2014 1108   K 4.2 08/01/2021 0931   K 4.5 12/20/2014 1108   CL 106 08/01/2021 0931   CL 104 12/20/2014 1108   CO2 28 08/01/2021 0931   CO2 30 12/20/2014 1108   GLUCOSE 116 (H) 08/01/2021 0931   GLUCOSE 76 12/20/2014 1108   BUN 26 (H) 08/01/2021 0931   BUN 15 01/19/2016 0000   BUN 10 12/20/2014 1108   CREATININE 0.74 08/01/2021 0931   CREATININE 0.89 12/20/2014 1108   CREATININE 0.96 03/13/2012 1007   CALCIUM 9.1 08/01/2021 0931   CALCIUM 9.4 12/20/2014 1108   GFRNONAA >60 08/01/2021 0931   GFRNONAA >60 12/20/2014 1108   GFRNONAA >60  06/04/2012 1107   GFRNONAA 72 03/13/2012 1007   GFRAA >60 08/19/2020 0610   GFRAA >60 12/20/2014 1108   GFRAA >60 06/04/2012 1107   GFRAA 84 03/13/2012 1007   Estimated Creatinine Clearance: 47.1 mL/min (by C-G formula based on SCr of 0.74 mg/dL).  COAG Lab Results  Component Value Date   INR 2.2 (H) 08/01/2021   INR 1.9 (H) 06/30/2021   INR 3.5 (H) 05/17/2021   PROTIME 31.2 (A) 04/17/2016   PROTIME 33.7 (A) 08/03/2015   PROTIME 34.4 (A)  07/06/2015   Radiology CT Angio Abd/Pel W and/or Wo Contrast  Result Date: 08/01/2021 CLINICAL DATA:  GI bleeding. EXAM: CTA ABDOMEN AND PELVIS WITHOUT AND WITH CONTRAST TECHNIQUE: Multidetector CT imaging of the abdomen and pelvis was performed using the standard protocol during bolus administration of intravenous contrast. Multiplanar reconstructed images and MIPs were obtained and reviewed to evaluate the vascular anatomy. CONTRAST:  11m OMNIPAQUE IOHEXOL 350 MG/ML SOLN COMPARISON:  CT abdomen pelvis-06/30/2021; 11/22/2017; 06/12/2017; lumbar spine MRI-09/05/2020 FINDINGS: VASCULAR Aorta: Moderate amount eccentric mixed calcified and noncalcified atherosclerotic plaque scattered throughout a normal caliber abdominal aorta, not resulting in a hemodynamically significant stenosis. No evidence of abdominal aortic dissection or periaortic stranding. Celiac: There is a minimal amount of eccentric mixed calcified and noncalcified atherosclerotic plaque involving the origin the celiac artery, not resulting in a hemodynamically significant stenosis. Conventional branching pattern. SMA: There is a minimal amount of eccentric predominantly calcified atherosclerotic plaque involving the origin the SMA, not resulting in a hemodynamically significant stenosis. Conventional branching pattern. Renals: Duplicated nearly codominant right renal arteries; there is a minimal amount of eccentric mixed calcified and noncalcified atherosclerotic plaque involving the origin of  the left renal artery not definitely resulting in hemodynamically significant stenosis. No vessel irregularity to suggest FMD. IMA: Remains patent and likely serves as the arterial supply to the area of intraluminal contrast extravasation involving the distal descending/proximal sigmoid colon. Inflow: There is a minimal amount of eccentric mixed calcified and noncalcified atherosclerotic plaque involving the bilateral common iliac arteries, not resulting in hemodynamically significant stenosis. The bilateral common and external iliac arteries are noted to be markedly tortuous though without a hemodynamically significant narrowing. The bilateral internal iliac arteries are disease though patent of normal caliber. Proximal Outflow: The bilateral common and imaged portions of the bilateral deep and superficial femoral arteries are of normal caliber and widely patent without hemodynamically significant narrowing. Veins: The IVC and pelvic venous systems appear widely patent. Review of the MIP images confirms the above findings. _________________________________________________________ NON-VASCULAR Lower chest: Limited visualization of the lower thorax demonstrates a large hernia involving the medial aspect of the right hemidiaphragm previously noted to contain a large portion of stomach however now contains a lower to portion of the transverse colon, interval change compared to the 06/30/2021 examination though without definitive evidence of upstream colonic dilatation or gaseous distension to suggest superimposed volvulus. Cardiomegaly. Pacer lead terminates within the right ventricular apex. Post left atrial appendage clipping, incompletely evaluated. Post median sternotomy. Calcified granuloma noted within the lingula. Hepatobiliary: Normal hepatic contour. There is a minimal amount of focal fatty infiltration adjacent to the fissure for the ligamentum teres. No discrete hepatic lesions. Normal appearance of the  gallbladder given degree distention. No radiopaque gallstones. No intra extrahepatic bili duct dilatation. No ascites. Pancreas: Normal appearance of the pancreas. Spleen: Abnormal appearance of the spleen however the spleen is again noted to be displaced anteriorly secondary to the large hiatal hernia. Adrenals/Urinary Tract: There is symmetric enhancement and excretion of the bilateral kidneys. Redemonstrated bilateral renal cysts, the largest of which arising from the anterior inferior aspect of the left kidney measuring approximately 9.1 x 8.0 cm (image 25, series 2). No evidence of nephrolithiasis. No discrete worrisome renal lesions. No urinary obstruction or perinephric stranding. Normal appearance of the bilateral adrenal glands. Normal appearance of the urinary bladder given degree of distention. Stomach/Bowel: There is intraluminal contrast extravasation involving the distal aspect of the descending colon/proximal. Sigmoid colon within the left lower abdomen/upper pelvis (axial image  120, series 5 with increased pooling on the acquired delayed venous phase images (representative image 47, series 6; coronal images 57 through 69, series 14). As above, limited visualization of the lower thorax demonstrates a large hernia involving the medial aspect of the right hemidiaphragm previously noted to contain a large portion of stomach however now contains a lower to portion of the transverse colon, interval change compared to the 06/30/2021 examination though without definitive evidence of upstream colonic dilatation or gaseous distension to suggest superimposed volvulus. Large stool burden without evidence of enteric obstruction. No pneumoperitoneum, pneumatosis or portal venous gas. Lymphatic: No bulky retroperitoneal, mesenteric, pelvic or inguinal lymphadenopathy. Reproductive: Dystrophic calcifications within normal sized prostate gland. No free fluid the pelvic cul-de-sac. Other: Sequela bilateral inguinal  hernia repairs without evidence of recurrence. Musculoskeletal: No acute or aggressive osseous abnormalities. Severe (approximately 75%) compression deformity involving the superior endplate of 624THL and moderate (approximately 50%) compression deformity involving the superior endplate of L1 both appear similar to lumbar spine MRI performed 09/05/2020. Mild-to-moderate multilevel lumbar spine DDD, worse at L2-L3 and L4-L5 with disc space height loss, endplate irregularity and sclerosis. Moderate scoliotic curvature of the lumbar spine. Mild degenerative change of the bilateral hips with joint space loss, subchondral sclerosis and osteophytosis. IMPRESSION: 1. Examination is positive for acute intraluminal contrast extravasation within the distal descending/proximal sigmoid colon within the left lower abdomen/upper pelvis with arterial supply likely via the IMA distribution. 2. Large right-sided posteromedial diaphragmatic hernia now containing a large portion of the mid aspect of the transverse colon in addition to a large portion of the stomach, an interval progression compared to the 06/30/2021 examination though without evidence of volvulus or enteric obstruction. 3. Scattered atherosclerotic plaque within a tortuous but normal caliber abdominal aorta. 4. Moderate to severe T12 and L1 compression deformities, similar to the 09/05/2020 lumbar spine MRI. Critical Value/emergent results were called by telephone at the time of interpretation on 08/01/2021 at 1:56 pm to provider [facility] , who verbally acknowledged these results. Electronically Signed   By: Sandi Mariscal M.D.   On: 08/01/2021 14:09    Assessment/Plan The patient is a 87 year old male with multiple medical issues including dementia who presented to the [facility] emergency department with rectal bleeding  1.  Rectal Bleeding: Patient presents with bright red blood per rectum.  CTA completed in the emergency department was  notable for active extravasation in the rectum.  The patient has a mechanical heart valve and is on Coumadin.  INR today was 2.2.  Hemoglobin 10.8.  Patient had at least 2 bloody bowel movements continue large amount of blood in the emergency department.  I reached out to the patient's niece Kevin [name] [phone] who is his power of attorney.  We had a long discussion in regard to endovascular embolization and the pathophysiology of rectal bleeding in the setting of Coumadin administration for mechanical heart valves.  We discussed the procedure in detail and she feels that that the patient would want to move forward.  We did discuss if he continued to bleed afterward the possibility of colon resection.  The niece felt that the patient would not want this larger for surgery at his age.  At the end of the conversation, his niece gave verbal consent to undergo an embolization of his rectum and attempt to stop his bleeding.  We will plan on this emergently this afternoon with Dr. Delana Meyer.  2.  Dementia: Patient's name Kevin [name] who is listed in epic  is his power of attorney and has consented to move forward with an endovascular embolization and attempt to control rectal bleeding  3.  Mechanical heart valve On chronic anticoagulation with Coumadin Patient INR at presentation in the emergency department was 2.2 Receiving 1 dose of vitamin K for temporary reversal  Discussed with Dr. Francene Castle, PA-C 08/01/2021 3:12 PM  This [name] was created with Dragon medical transcription system.  Any error is purely unintentional.

## 2021-08-01 NOTE — Interval H&P Note (Signed)
History and Physical Interval Note:  08/01/2021 4:32 PM  Kevin [name]  has presented today for surgery, with the diagnosis of Gastrointestinal bleeding.  The various methods of treatment have been discussed with the patient and family. After consideration of risks, benefits and other options for treatment, the patient has consented to  Procedure(s): EMBOLIZATION (N/A) as a surgical intervention.  The patient's history has been reviewed, patient examined, no change in status, stable for surgery.  I have reviewed the patient's chart and labs.  Questions were answered to the patient's satisfaction.     Hortencia Pilar

## 2021-08-01 NOTE — Progress Notes (Signed)
Patient had a bowel movement upon arrival to pacu, cleaned patient up and linens changed.

## 2021-08-01 NOTE — Progress Notes (Signed)
Blood transfusion finished, post vitals completed and stable. Patient has no evidence of transfusion reaction.

## 2021-08-01 NOTE — ED Notes (Signed)
2nd RN unsuccessful IV attempt x 2.

## 2021-08-01 NOTE — ED Notes (Signed)
Pt continuing to be agitated.  Mitts placed.

## 2021-08-02 ENCOUNTER — Encounter: Payer: Self-pay | Admitting: Vascular Surgery

## 2021-08-02 DIAGNOSIS — D62 Acute posthemorrhagic anemia: Secondary | ICD-10-CM | POA: Diagnosis not present

## 2021-08-02 DIAGNOSIS — I5032 Chronic diastolic (congestive) heart failure: Secondary | ICD-10-CM

## 2021-08-02 DIAGNOSIS — K625 Hemorrhage of anus and rectum: Secondary | ICD-10-CM

## 2021-08-02 DIAGNOSIS — K922 Gastrointestinal hemorrhage, unspecified: Secondary | ICD-10-CM | POA: Diagnosis not present

## 2021-08-02 DIAGNOSIS — Z952 Presence of prosthetic heart valve: Secondary | ICD-10-CM | POA: Diagnosis not present

## 2021-08-02 DIAGNOSIS — Z7901 Long term (current) use of anticoagulants: Secondary | ICD-10-CM | POA: Diagnosis not present

## 2021-08-02 LAB — PROTIME-INR
INR: 2.5 — ABNORMAL HIGH (ref 0.8–1.2)
Prothrombin Time: 26.5 seconds — ABNORMAL HIGH (ref 11.4–15.2)

## 2021-08-02 LAB — HEMOGLOBIN AND HEMATOCRIT, BLOOD
HCT: 24.1 % — ABNORMAL LOW (ref 39.0–52.0)
HCT: 19.2 % — ABNORMAL LOW (ref 39.0–52.0)
Hemoglobin: 8.1 g/dL — ABNORMAL LOW (ref 13.0–17.0)
Hemoglobin: 6.5 g/dL — ABNORMAL LOW (ref 13.0–17.0)

## 2021-08-02 LAB — PREPARE RBC (CROSSMATCH)

## 2021-08-02 MED ORDER — POLYETHYLENE GLYCOL 3350 17 G PO PACK
17.0000 g | PACK | Freq: Two times a day (BID) | ORAL | Status: DC
  Administered 2021-08-02 – 2021-08-04 (×3): 17 g via ORAL
  Filled 2021-08-02 (×4): qty 1

## 2021-08-02 MED ORDER — SODIUM CHLORIDE 0.9% IV SOLUTION: Freq: Once | INTRAVENOUS | Status: DC

## 2021-08-02 MED ORDER — FUROSEMIDE 10 MG/ML IJ SOLN
20.0000 mg | Freq: Once | INTRAMUSCULAR | Status: DC
  Filled 2021-08-02: qty 2

## 2021-08-02 MED ORDER — FUROSEMIDE 10 MG/ML IJ SOLN
20.0000 mg | Freq: Once | INTRAMUSCULAR | Status: AC
  Administered 2021-08-02: 20 mg via INTRAVENOUS

## 2021-08-02 MED ORDER — HALOPERIDOL LACTATE 5 MG/ML IJ SOLN
1.0000 mg | Freq: Four times a day (QID) | INTRAMUSCULAR | Status: DC | PRN
  Filled 2021-08-02: qty 1

## 2021-08-02 MED ORDER — SODIUM CHLORIDE 0.9% IV SOLUTION: Freq: Once | INTRAVENOUS | Status: AC

## 2021-08-02 NOTE — Progress Notes (Signed)
[facility] Daily Progress Note  08/01/21:             1.  Ultrasound guidance for vascular access right femoral artery             2.  Catheter placement into third order branches of the inferior mesenteric artery             3.  Aortogram and selective angiogram of the inferior mesenteric artery             4.  Microbead embolization of inferior mesenteric artery with 500-700  polyvinyl alcohol beads, total volume 1.5 cc.             5.  StarClose closure device right femoral artery  Subjective: Patient still with some agitation this afternoon.   Objective: Vitals:   08/02/21 1130 08/02/21 1145 08/02/21 1200 08/02/21 1300  BP: 123/80 123/71 129/88 (!) 146/90  Pulse: 82 (!) 119 (!) 114 (!) 111  Resp: (!) 30 20 (!) 38 (!) 25  Temp: 97.6 F (36.4 C) 97.6 F (36.4 C) 97.6 F (36.4 C)   TempSrc: Axillary Axillary Axillary   SpO2: 100% 100% 95% 90%  Weight:      Height:        Intake/Output Summary (Last 24 hours) at 08/02/2021 1309 Last data filed at 08/02/2021 1005 Gross per 24 hour  Intake 2366.4 ml  Output 200 ml  Net 2166.4 ml   Physical Exam: Confused, Agitated at times, has mittens CV: RRR Pulmonary: CTA Bilaterally Abdomen: Soft, Nontender, Nondistended Right Groin:  Access Site: PAD removed. Site is clean and dry.  Vascular: Warm distally   Laboratory: CBC    Component Value Date/Time   WBC 4.9 08/01/2021 0931   HGB 8.1 (L) 08/02/2021 0846   HGB 12.8 (L) 12/20/2014 1108   HCT 24.1 (L) 08/02/2021 0846   HCT 38.1 (L) 12/20/2014 1108   PLT 149 (L) 08/01/2021 0931   PLT 203 12/20/2014 1108   BMET    Component Value Date/Time   NA 143 08/01/2021 0931   NA 140 01/19/2016 0000   NA 140 12/20/2014 1108   K 4.2 08/01/2021 0931   K 4.5 12/20/2014 1108   CL 106 08/01/2021 0931   CL 104 12/20/2014 1108   CO2 28 08/01/2021 0931   CO2 30 12/20/2014 1108   GLUCOSE 116 (H) 08/01/2021 0931   GLUCOSE 76 12/20/2014 1108   BUN 26 (H)  08/01/2021 0931   BUN 15 01/19/2016 0000   BUN 10 12/20/2014 1108   CREATININE 0.74 08/01/2021 0931   CREATININE 0.89 12/20/2014 1108   CREATININE 0.96 03/13/2012 1007   CALCIUM 9.1 08/01/2021 0931   CALCIUM 9.4 12/20/2014 1108   GFRNONAA >60 08/01/2021 0931   GFRNONAA >60 12/20/2014 1108   GFRNONAA >60 06/04/2012 1107   GFRNONAA 72 03/13/2012 1007   GFRAA >60 08/19/2020 0610   GFRAA >60 12/20/2014 1108   GFRAA >60 06/04/2012 1107   GFRAA 84 03/13/2012 1007   Assessment/Planning: The patient is a 87 year old male with multiple medical issues including dementia who presented to the [facility] emergency department with rectal bleeding s/p embolization to the inferior mesenteric artery - POD#1  1) patient continues to have numerous large bloody bowel movements.  He is tachycardic however his blood pressure is stable.  Making urine. 2) slightly over a gram drop in hemoglobin.  Patient has been receiving transfusions / vitamin K in an attempt for reversal and  hopefully subsequent hemostasis. 3) patient is being monitored in the ICU and gastroenterology has been consulted and is following. 4) the patient's niece Berline Chough who is listed in epic is his power of attorney.  4) at this point, vascular surgery has no further recommendations.  There is no plan for a repeat embolization.  Our team will sign off at this time.  Please not hesitate to reconsult if there is anything else we can help with.  Discussed with Dr. Eber Hong Devyon Keator PA-C 08/02/2021 1:09 PM

## 2021-08-02 NOTE — Consult Note (Signed)
Cephas Darby, MD [address]  [place]  [place], [place] [address]  Main: [phone]  Fax: [phone] Pager: [phone]   Consultation  Referring Provider:     No ref. provider found Primary Care Physician:  Kirk Ruths, MD Primary Gastroenterologist: Althia Forts       Reason for Consultation:     Rectal bleeding  Date of Admission:  08/01/2021 Date of Consultation:  08/02/2021         HPI:   Kevin [name] is a 87 y.o. male with history of A. fib, mechanical valve on Coumadin, severe dementia, significant hearing loss multiple comorbidities is admitted from nursing home with significant painless rectal bleeding.  Patient had mild tachycardia, otherwise was hemodynamically stable, mildly elevated BUN, normal creatinine, hemoglobin was 10.8 on admission.  In the ER, patient underwent CT angio abdomen and pelvis which revealed acute intraluminal contrast extravasation in the distal descending/proximal sigmoid colon likely representing IMA distribution.  Vascular surgery was immediately consulted, patient underwent embolization of the IMA yesterday.  Overnight, patient had 7 episodes of bloody bowel movements mixed with clots.  He had 1 bowel movement about an hour ago mixed with clots per ICU nurse.  However, he has been hemodynamically stable, patient is receiving vitamin K as well as FFP to reverse his INR.  His hemoglobin dropped from 10.8 to 8.1 within last 24 hours.  He is currently n.p.o. he is on Protonix 40 IV twice daily.  Patient is a poor historian, his niece is power of attorney.   NSAIDs: None  Antiplts/Anticoagulants/Anti thrombotics: Coumadin for history of A. fib and mechanical mitral valve  GI Procedures: Unknown  Past Medical History:  Diagnosis Date  . Atrial fibrillation ([place])   . Cancer ([place])    melanoma- right ear  . Cardiomyopathy, secondary ([place])   . Chronic headaches started age 57  . Congestive heart failure (CHF) ([place])   .  Coronary artery disease involving autologous vein coronary bypass graft with angina pectoris ([place]) 10/18/2018  . Coronary atherosclerosis of native coronary artery   . DJD (degenerative joint disease)   . Essential hypertension, benign   . GERD (gastroesophageal reflux disease)   . Glaucoma   . History of mitral valve replacement with mechanical valve   . HOH (hard of hearing)   . Hyperlipidemia, unspecified   . Hypertension   . Lumbago   . Osteoporosis    secondary to low testoerone  . S/P MVR (mitral valve replacement)   . Sciatica   . Scoliosis     Past Surgical History:  Procedure Laterality Date  . CARDIOVERSION  2013  . CATARACT EXTRACTION, BILATERAL     x 2  . CORONARY ARTERY BYPASS GRAFT  12/2014   coronary artery bypass w/arterial grafts   . EMBOLIZATION N/A 08/01/2021   Procedure: EMBOLIZATION;  Surgeon: Katha Cabal, MD;  Location: [facility];  Service: Cardiovascular;  Laterality: N/A;  . HERNIA REPAIR     x 2  . MELANOMA EXCISION     right ear  . MITRAL VALVE REPLACEMENT  1994   [facility]  . NASAL SEPTUM SURGERY  1963  . PERMANENT PACEMAKER INSERTION Left 12/22/2014    Prior to Admission medications   Medication Sig Start Date End Date Taking? Authorizing Provider  amLODipine (NORVASC) 5 MG tablet Take 5 mg by mouth daily. 06/14/21  Yes [provider]  brimonidine (ALPHAGAN) 0.15 % ophthalmic solution Place 1 drop into  both eyes 2 (two) times daily.  08/21/11  Yes [provider]  Cholecalciferol (VITAMIN D) 50 MCG (2000 UT) tablet Take 2,000 Units by mouth daily. 10/14/18  Yes [provider]  Coenzyme Q10 10 MG capsule Take 100 mg by mouth 4 (four) times daily. 10/14/18  Yes [provider]  diclofenac Sodium (VOLTAREN) 1 % GEL Apply 4 g topically 4 (four) times daily. For left knee pain 05/26/20  Yes [provider]  docusate sodium (COLACE) 100 MG capsule Take 2 capsules (200 mg total) by mouth at  bedtime. 03/06/18  Yes Crecencio Mc, MD  furosemide (LASIX) 20 MG tablet Take 1 tablet (20 mg total) by mouth daily. 06/18/17  Yes Fritzi Mandes, MD  latanoprost (XALATAN) 0.005 % ophthalmic solution Place 1 drop into both eyes at bedtime.  08/13/11  Yes [provider]  lidocaine (LIDODERM) 5 % Place 1 patch onto the skin daily. Remove & Discard patch within 12 hours or as directed by MD   Yes [provider]  losartan (COZAAR) 100 MG tablet TAKE 1 TABLET BY MOUTH DAILY 12/23/18  Yes Crecencio Mc, MD  Melatonin 3 MG TABS Take 1 tablet by mouth at bedtime.  09/28/18  Yes [provider]  metoprolol tartrate (LOPRESSOR) 25 MG tablet Take 25 mg by mouth 2 (two) times daily. Hold for BP less than 100/'s or pulse less than 60 09/28/18  Yes [provider]  mirtazapine (REMERON) 15 MG tablet Take 7.5 mg by mouth at bedtime. 06/26/21  Yes [provider]  niacinamide 500 MG tablet Take 500 mg by mouth daily. 10/14/18  Yes [provider]  omeprazole (PRILOSEC) 40 MG capsule Take 40 mg by mouth 2 (two) times daily.   Yes [provider]  polyethylene glycol (MIRALAX / GLYCOLAX) 17 g packet Take 17 g by mouth daily.   Yes [provider]  tamsulosin (FLOMAX) 0.4 MG CAPS capsule TAKE 1 CAPSULE BY MOUTH EVERY DAY 08/28/18  Yes Crecencio Mc, MD  traZODone (DESYREL) 50 MG tablet Take 50 mg by mouth at bedtime. 06/18/21  Yes [provider]  warfarin (COUMADIN) 3 MG tablet Take 3 mg by mouth daily.   Yes [provider]  acetaminophen (TYLENOL) 325 MG tablet Take 650 mg by mouth every 4 (four) hours as needed for fever. Maximum dose for 24 hours is 3000 mg from all sources of Acetaminophen/tylenol    [provider]  ferrous sulfate 325 (65 FE) MG tablet Take 1 tablet by mouth every other day.  06/13/12   Crecencio Mc, MD  loperamide (IMODIUM) 2 MG capsule Take 2-4 mg by mouth as needed for diarrhea or loose stools.  Take 2 tablets to = 4 mg for initial loose stool, take 1 tablet to = 2 mg for each subsequent loose stool for up to 8 doses/24 hours Patient not taking: No sig reported    [provider]  meclizine (ANTIVERT) 25 MG tablet Take 25 mg by mouth 3 (three) times daily as needed for dizziness. Patient not taking: No sig reported    [provider]  NON FORMULARY Diet Type: regular    [provider]  Nutritional Supplements (ENSURE ENLIVE PO) Take 0.08 g by mouth every other day.    [provider]  ondansetron (ZOFRAN-ODT) 4 MG disintegrating tablet Take 4 mg by mouth every 4 (four) hours as needed for nausea or vomiting. Patient not taking: No sig reported 10/01/18  [provider]  warfarin (COUMADIN) 1 MG tablet Take 0.5 mg by mouth daily. Patient not taking: Reported on 08/01/2021    [provider]  warfarin (COUMADIN) 4 MG tablet Take 4 mg by mouth daily. Patient not taking: No sig reported    [provider]  amitriptyline (ELAVIL) 25 MG tablet Take 1 tablet (25 mg total) by mouth at bedtime. 09/21/11 03/04/12  Crecencio Mc, MD   Current Facility-Administered Medications:  .  0.9 %  sodium chloride infusion, , Intravenous, Continuous, Sharen Hones, MD, Last Rate: 75 mL/hr at 08/02/21 1005, Rate Change at 08/02/21 1005 .  acetaminophen (TYLENOL) tablet 650 mg, 650 mg, Oral, Q4H PRN, Schnier, Dolores Lory, MD, 650 mg at 08/02/21 0856 .  brimonidine (ALPHAGAN) 0.15 % ophthalmic solution 1 drop, 1 drop, Both Eyes, BID, Schnier, Dolores Lory, MD, 1 drop at 08/02/21 0851 .  Chlorhexidine Gluconate Cloth 2 % PADS 6 each, 6 each, Topical, Q0600, Agbata, Tochukwu, MD, 6 each at 08/02/21 0510 .  cholecalciferol (VITAMIN D3) tablet 2,000 Units, 2,000 Units, Oral, Daily, Schnier, Dolores Lory, MD, 2,000 Units at 08/02/21 [phone] .  ferrous sulfate tablet 325 mg, 325 mg, Oral, QODAY, Schnier, Dolores Lory, MD .  latanoprost (XALATAN) 0.005 % ophthalmic  solution 1 drop, 1 drop, Both Eyes, QHS, Schnier, Dolores Lory, MD, 1 drop at 08/01/21 2218 .  melatonin tablet 2.5 mg, 2.5 mg, Oral, QHS, Schnier, Dolores Lory, MD .  mirtazapine (REMERON) tablet 7.5 mg, 7.5 mg, Oral, QHS, Schnier, Dolores Lory, MD .  niacin (SLO-NIACIN) CR tablet 500 mg, 500 mg, Oral, Daily, Schnier, Dolores Lory, MD, 500 mg at 08/02/21 0857 .  ondansetron (ZOFRAN) tablet 4 mg, 4 mg, Oral, Q6H PRN **OR** ondansetron (ZOFRAN) injection 4 mg, 4 mg, Intravenous, Q6H PRN, Schnier, Dolores Lory, MD .  pantoprazole (PROTONIX) injection 40 mg, 40 mg, Intravenous, Q24H, Schnier, Dolores Lory, MD, 40 mg at 08/01/21 1500 .  phytonadione (VITAMIN K) SQ injection 5 mg, 5 mg, Subcutaneous, Once, Schnier, Dolores Lory, MD .  polyethylene glycol (MIRALAX / GLYCOLAX) packet 17 g, 17 g, Oral, BID, Pharrah Rottman, Tally Due, MD .  tamsulosin [facility]) capsule 0.4 mg, 0.4 mg, Oral, Daily, Schnier, Dolores Lory, MD, 0.4 mg at 08/02/21 0856 .  traZODone (DESYREL) tablet 50 mg, 50 mg, Oral, QHS, Schnier, Dolores Lory, MD   Family History  Problem Relation Age of Onset  . Cancer Father        unknown  . Alzheimer's disease Brother   . Arrhythmia Brother   . Mental illness Mother      Social History   Tobacco Use  . Smoking status: Never  . Smokeless tobacco: Never  Vaping Use  . Vaping Use: Never used  Substance Use Topics  . Alcohol use: No  . Drug use: No    Allergies as of 08/01/2021 - Review Complete 08/01/2021  Allergen Reaction Noted  . Aspirin  08/21/2011  . Cymbalta [duloxetine hcl] Other (See Comments) 01/31/2016    Review of Systems:    All systems reviewed and negative except where noted in HPI.   Physical Exam:  Vital signs in last 24 hours: Temp:  [97 F (36.1 C)-98.3 F (36.8 C)] 97.6 F (36.4 C) (08/24 1200) Pulse Rate:  [60-120] 114 (08/24 1200) Resp:  [10-38] 38 (08/24 1200) BP: (110-192)/(49-122) 129/88 (08/24 1200) SpO2:  [93 %-100 %] 95 % (08/24 1200) Weight:  [57.1 kg] 57.1 kg  (08/23 1830) Last BM Date: 08/02/21 General:  Pleasant, cooperative in NAD Head:  Normocephalic and atraumatic. Eyes:   No icterus.   Conjunctiva pink. PERRLA. Ears:  Normal auditory acuity. Neck:  Supple; no masses or thyroidomegaly Lungs: Respirations even and unlabored. Lungs clear to auscultation bilaterally.   No wheezes, crackles, or rhonchi.  Heart: Tachycardia, paced rhythm;  Without murmur, clicks, rubs or gallops Abdomen:  Soft, nondistended, nontender. Normal bowel sounds. No appreciable masses or hepatomegaly.  No rebound or guarding.  Rectal:  Not performed. Msk:  Symmetrical without gross deformities.  Strength normal  Extremities:  Without edema, cyanosis or clubbing. Neurologic:  Alert and oriented x0;  Skin:  Intact without significant lesions or rashes. Psych:  Alert and cooperative. Normal affect.  LAB RESULTS: CBC Latest Ref Rng & Units 08/02/2021 08/01/2021 08/01/2021  WBC 4.0 - 10.5 K/uL - - -  Hemoglobin 13.0 - 17.0 g/dL 8.1(L) 9.4(L) 9.3(L)  Hematocrit 39.0 - 52.0 % 24.1(L) 27.8(L) 28.3(L)  Platelets 150 - 400 K/uL - - -    BMET BMP Latest Ref Rng & Units 08/01/2021 06/30/2021 08/19/2020  Glucose 70 - 99 mg/dL 116(H) 181(H) 106(H)  BUN 8 - 23 mg/dL 26(H) 30(H) 34(H)  Creatinine 0.61 - 1.24 mg/dL 0.74 0.93 0.81  Sodium 135 - 145 mmol/L 143 140 136  Potassium 3.5 - 5.1 mmol/L 4.2 3.5 3.4(L)  Chloride 98 - 111 mmol/L 106 103 103  CO2 22 - 32 mmol/L '28 26 25  ' Calcium 8.9 - 10.3 mg/dL 9.1 9.6 8.7(L)    LFT Hepatic Function Latest Ref Rng & Units 08/01/2021 06/30/2021 08/19/2020  Total Protein 6.5 - 8.1 g/dL 6.5 7.0 5.7(L)  Albumin 3.5 - 5.0 g/dL 3.9 4.1 3.6  AST 15 - 41 U/L '21 30 21  ' ALT 0 - 44 U/L '13 17 14  ' Alk Phosphatase 38 - 126 U/L 69 61 62  Total Bilirubin 0.3 - 1.2 mg/dL 0.9 0.9 1.3(H)  Bilirubin, Direct 0.0 - 0.3 mg/dL - - -     STUDIES: PERIPHERAL VASCULAR CATHETERIZATION  Result Date: 08/01/2021 See surgical note for result.  CT Angio Abd/Pel  W and/or Wo Contrast  Result Date: 08/01/2021 CLINICAL DATA:  GI bleeding. EXAM: CTA ABDOMEN AND PELVIS WITHOUT AND WITH CONTRAST TECHNIQUE: Multidetector CT imaging of the abdomen and pelvis was performed using the standard protocol during bolus administration of intravenous contrast. Multiplanar reconstructed images and MIPs were obtained and reviewed to evaluate the vascular anatomy. CONTRAST:  148m OMNIPAQUE IOHEXOL 350 MG/ML SOLN COMPARISON:  CT abdomen pelvis-06/30/2021; 11/22/2017; 06/12/2017; lumbar spine MRI-09/05/2020 FINDINGS: VASCULAR Aorta: Moderate amount eccentric mixed calcified and noncalcified atherosclerotic plaque scattered throughout a normal caliber abdominal aorta, not resulting in a hemodynamically significant stenosis. No evidence of abdominal aortic dissection or periaortic stranding. Celiac: There is a minimal amount of eccentric mixed calcified and noncalcified atherosclerotic plaque involving the origin the celiac artery, not resulting in a hemodynamically significant stenosis. Conventional branching pattern. SMA: There is a minimal amount of eccentric predominantly calcified atherosclerotic plaque involving the origin the SMA, not resulting in a hemodynamically significant stenosis. Conventional branching pattern. Renals: Duplicated nearly codominant right renal arteries; there is a minimal amount of eccentric mixed calcified and noncalcified atherosclerotic plaque involving the origin of the left renal artery not definitely resulting in hemodynamically significant stenosis. No vessel irregularity to suggest FMD. IMA: Remains patent and likely serves as the arterial supply to the area of intraluminal contrast extravasation involving the distal descending/proximal sigmoid colon. Inflow: There is a minimal amount of eccentric mixed calcified and  noncalcified atherosclerotic plaque involving the bilateral common iliac arteries, not resulting in hemodynamically significant stenosis. The  bilateral common and external iliac arteries are noted to be markedly tortuous though without a hemodynamically significant narrowing. The bilateral internal iliac arteries are disease though patent of normal caliber. Proximal Outflow: The bilateral common and imaged portions of the bilateral deep and superficial femoral arteries are of normal caliber and widely patent without hemodynamically significant narrowing. Veins: The IVC and pelvic venous systems appear widely patent. Review of the MIP images confirms the above findings. _________________________________________________________ NON-VASCULAR Lower chest: Limited visualization of the lower thorax demonstrates a large hernia involving the medial aspect of the right hemidiaphragm previously noted to contain a large portion of stomach however now contains a lower to portion of the transverse colon, interval change compared to the 06/30/2021 examination though without definitive evidence of upstream colonic dilatation or gaseous distension to suggest superimposed volvulus. Cardiomegaly. Pacer lead terminates within the right ventricular apex. Post left atrial appendage clipping, incompletely evaluated. Post median sternotomy. Calcified granuloma noted within the lingula. Hepatobiliary: Normal hepatic contour. There is a minimal amount of focal fatty infiltration adjacent to the fissure for the ligamentum teres. No discrete hepatic lesions. Normal appearance of the gallbladder given degree distention. No radiopaque gallstones. No intra extrahepatic bili duct dilatation. No ascites. Pancreas: Normal appearance of the pancreas. Spleen: Abnormal appearance of the spleen however the spleen is again noted to be displaced anteriorly secondary to the large hiatal hernia. Adrenals/Urinary Tract: There is symmetric enhancement and excretion of the bilateral kidneys. Redemonstrated bilateral renal cysts, the largest of which arising from the anterior inferior aspect of the  left kidney measuring approximately 9.1 x 8.0 cm (image 25, series 2). No evidence of nephrolithiasis. No discrete worrisome renal lesions. No urinary obstruction or perinephric stranding. Normal appearance of the bilateral adrenal glands. Normal appearance of the urinary bladder given degree of distention. Stomach/Bowel: There is intraluminal contrast extravasation involving the distal aspect of the descending colon/proximal. Sigmoid colon within the left lower abdomen/upper pelvis (axial image 120, series 5 with increased pooling on the acquired delayed venous phase images (representative image 47, series 6; coronal images 57 through 69, series 14). As above, limited visualization of the lower thorax demonstrates a large hernia involving the medial aspect of the right hemidiaphragm previously noted to contain a large portion of stomach however now contains a lower to portion of the transverse colon, interval change compared to the 06/30/2021 examination though without definitive evidence of upstream colonic dilatation or gaseous distension to suggest superimposed volvulus. Large stool burden without evidence of enteric obstruction. No pneumoperitoneum, pneumatosis or portal venous gas. Lymphatic: No bulky retroperitoneal, mesenteric, pelvic or inguinal lymphadenopathy. Reproductive: Dystrophic calcifications within normal sized prostate gland. No free fluid the pelvic cul-de-sac. Other: Sequela bilateral inguinal hernia repairs without evidence of recurrence. Musculoskeletal: No acute or aggressive osseous abnormalities. Severe (approximately 75%) compression deformity involving the superior endplate of F81 and moderate (approximately 50%) compression deformity involving the superior endplate of L1 both appear similar to lumbar spine MRI performed 09/05/2020. Mild-to-moderate multilevel lumbar spine DDD, worse at L2-L3 and L4-L5 with disc space height loss, endplate irregularity and sclerosis. Moderate scoliotic  curvature of the lumbar spine. Mild degenerative change of the bilateral hips with joint space loss, subchondral sclerosis and osteophytosis. IMPRESSION: 1. Examination is positive for acute intraluminal contrast extravasation within the distal descending/proximal sigmoid colon within the left lower abdomen/upper pelvis with arterial supply likely via the IMA distribution. 2. Large right-sided  posteromedial diaphragmatic hernia now containing a large portion of the mid aspect of the transverse colon in addition to a large portion of the stomach, an interval progression compared to the 06/30/2021 examination though without evidence of volvulus or enteric obstruction. 3. Scattered atherosclerotic plaque within a tortuous but normal caliber abdominal aorta. 4. Moderate to severe T12 and L1 compression deformities, similar to the 09/05/2020 lumbar spine MRI. Critical Value/emergent results were called by telephone at the time of interpretation on 08/01/2021 at 1:56 pm to provider [facility] , who verbally acknowledged these results. Electronically Signed   By: Sandi Mariscal M.D.   On: 08/01/2021 14:09      Impression / Plan:   Kevin [name] is a 87 y.o. male with history of A. fib, mechanical mitral valve, CHF on Coumadin, dementia, hearing loss who is admitted from nursing home with painless rectal bleeding, CT angio positive for active extravasation in the distal descending/proximal sigmoid colon s/p embolization on 8/23 of IMA territory.  Patient does have left colonic diverticulosis.  He does have history of large diaphragmatic hernia containing stomach and transverse colon based on imaging.  His abdominal exam is benign.  Most likely the source of rectal bleeding is diverticular for which patient underwent embolization already.  If he continues to have active bleeding for next 24 hours with significant drop in hemoglobin, will discuss with patient's power of attorney regarding endoscopic evaluation.  Patient  is currently hemodynamically stable, therefore I do not see the urgent need for endoscopic evaluation at this time.  Patient is also receiving IV fluids and multiple blood products which can lead to hemodilution in addition to blood loss, causing drop in hemoglobin Monitor CBC closely to maintain hemoglobin above 8 given extensive cardiac history Patient is currently receiving 4 units of FFP's due to active bleeding Temporarily reverse INR to less than 2 Okay with clear liquid diet Start MiraLAX 1-2 times daily and expect to have several bloody bowel movements  Thank you for involving me in the care of this patient. Dr. Allen Norris will cover from tomorrow    LOS: 1 day   Sherri Sear, MD  08/02/2021, 12:10 PM    Note: This dictation was prepared with Dragon dictation along with smaller phrase technology. Any transcriptional errors that result from this process are unintentional.

## 2021-08-02 NOTE — Progress Notes (Addendum)
PROGRESS NOTE    Kevin [name]  [id number] DOB: 1925/06/20 DOA: 08/01/2021 PCP: Kirk Ruths, MD   Chief complaint.  Rectal bleeding. Brief Narrative:   Kevin [name] is a 87 y.o. male with medical history significant for hypertension, dyslipidemia, coronary artery disease, atrial fibrillation, mechanical valve on chronic anticoagulation therapy with Coumadin and chronic diastolic dysfunction CHF who was sent to the ER via EMS for evaluation of rectal bleeding. The staff at the skilled nursing facility noted that he had bowel movements containing blood and so EMS was called. CT angiogram of abdomen/pelvis showed bleeding within the distal descending/proximal sigmoid colon. Mesentery embolization of the inferior mesenteric artery was performed by Dr. Ronalee Belts on 8/23.  Patient also received vitamin K injection for elevated INR.  Assessment & Plan:   Principal Problem:   ABLA (acute blood loss anemia) Active Problems:   History of mitral valve replacement with mechanical valve   Depression   Benign essential HTN   GERD without esophagitis   Coronary artery disease involving autologous vein coronary bypass graft with angina pectoris (HCC)   Rectal bleeding   Chronic diastolic CHF (congestive heart failure) (HCC)   Acute blood loss anemia (ABLA)  #1.  Acute blood loss anemia Rectal bleeding. Elevated INR due to warfarin. Patient is a status post embolization of inferior mesenteric artery. Overnight, patient still has multiple rectal bleedings. I will consult GI to consider additional treatment options. Patient received vitamin K yesterday, INR still elevated today.  Due to emergent condition with bleeding, I will give 2 FFP's. I am aware that patient has mechanic heart valve, but with acute bleeding, the benefit overweighs the risk for FFP. Continue hold warfarin. Continue monitor hemoglobin, transfuse when hemoglobin less than 7. Patient will be n.p.o. until bleeding is  resolved.  Continue some fluids.  #2.  Mechanic mitral valve. Coronary artery disease status post CABG. Chronic diastolic congestive heart failure. Patient is not able to receive any anticoagulation at this time due to acute bleeding.  Currently he does not have volume overload, I will continue some fluids, but reduce the dose  3.  Essential hypertension Continue current treatment.  #4.  Dementia. Patient is quite confused this time  Had discussion with patient and niece, patient condition is critical, high probability of mortality due to not able to stop bleeding, not able to anticoagulate with mechanical mitral valve.  1709. Transfuse 1 unit PRBC for Hb 6.5. Give '20mg'$  iv lasix now and '20mg'$  after PRBC to prevent volume overload.   DVT prophylaxis: scds Code Status: dnr Family Communication: Niece updated Disposition Plan:    Status is: Inpatient  Remains inpatient appropriate because:Ongoing diagnostic testing needed not appropriate for outpatient work up, IV treatments appropriate due to intensity of illness or inability to take PO, and Inpatient level of care appropriate due to severity of illness  Dispo: The patient is from:  Memory unit              Anticipated d/c is to:  Memory unit              Patient currently is not medically stable to d/c.   Difficult to place patient No        I/O last 3 completed shifts: In: 1761.5 [I.V.:903.5; Blood:258; IV Piggyback:600] Out: 200 [Urine:200] No intake/output data recorded.     Consultants:  GI, Vascular  Procedures: Mesentery embolization  Antimicrobials: None   Subjective: Very confused, no agitation.  He is n.p.o. due to  GI bleed.  No nausea vomiting.  He still has multiple episodes of rectal bleeding overnight. No fever or chills No short of breath or cough.   Objective: Vitals:   08/02/21 0000 08/02/21 0400 08/02/21 0500 08/02/21 0600  BP:  (!) 113/58  114/85  Pulse:  75 82 91  Resp:  20 (!) 26 (!) 22   Temp: 97.6 F (36.4 C) 97.8 F (36.6 C)    TempSrc: Axillary Axillary    SpO2:  97% 100% 96%  Weight:      Height:        Intake/Output Summary (Last 24 hours) at 08/02/2021 0935 Last data filed at 08/02/2021 0558 Gross per 24 hour  Intake 1761.51 ml  Output 200 ml  Net 1561.51 ml   Filed Weights   08/01/21 0949 08/01/21 1830  Weight: 59 kg 57.1 kg    Examination:  General exam: Frail and pale. Respiratory system: Clear to auscultation. Respiratory effort normal. Cardiovascular system: S1 & S2 heard, RRR. No JVD, murmurs, rubs, gallops or clicks. No pedal edema. Gastrointestinal system: Abdomen is nondistended, soft and nontender. No organomegaly or masses felt. Normal bowel sounds heard. Central nervous system: Alert and oriented x1. No focal neurological deficits. Extremities: Symmetric 5 x 5 power. Skin: No rashes, lesions or ulcers     Data Reviewed: I have personally reviewed following labs and imaging studies  CBC: Recent Labs  Lab 08/01/21 0931 08/01/21 1534 08/01/21 2250 08/02/21 0846  WBC 4.9  --   --   --   HGB 10.8* 9.3* 9.4* 8.1*  HCT 31.7* 28.3* 27.8* 24.1*  MCV 102.9*  --   --   --   PLT 149*  --   --   --    Basic Metabolic Panel: Recent Labs  Lab 08/01/21 0931  NA 143  K 4.2  CL 106  CO2 28  GLUCOSE 116*  BUN 26*  CREATININE 0.74  CALCIUM 9.1   GFR: Estimated Creatinine Clearance: 45.6 mL/min (by C-G formula based on SCr of 0.74 mg/dL). Liver Function Tests: Recent Labs  Lab 08/01/21 0931  AST 21  ALT 13  ALKPHOS 69  BILITOT 0.9  PROT 6.5  ALBUMIN 3.9   No results for input(s): LIPASE, AMYLASE in the last 168 hours. No results for input(s): AMMONIA in the last 168 hours. Coagulation Profile: Recent Labs  Lab 08/01/21 0931 08/02/21 0439  INR 2.2* 2.5*   Cardiac Enzymes: No results for input(s): CKTOTAL, CKMB, CKMBINDEX, TROPONINI in the last 168 hours. BNP (last 3 results) No results for input(s): PROBNP in the last  8760 hours. HbA1C: No results for input(s): HGBA1C in the last 72 hours. CBG: Recent Labs  Lab 08/01/21 1820  GLUCAP 112*   Lipid Profile: No results for input(s): CHOL, HDL, LDLCALC, TRIG, CHOLHDL, LDLDIRECT in the last 72 hours. Thyroid Function Tests: No results for input(s): TSH, T4TOTAL, FREET4, T3FREE, THYROIDAB in the last 72 hours. Anemia Panel: No results for input(s): VITAMINB12, FOLATE, FERRITIN, TIBC, IRON, RETICCTPCT in the last 72 hours. Sepsis Labs: No results for input(s): PROCALCITON, LATICACIDVEN in the last 168 hours.  Recent Results (from the past 240 hour(s))  Resp Panel by RT-PCR (Flu A&B, Covid) Nasopharyngeal Swab     Status: None   Collection Time: 08/01/21 11:46 AM   Specimen: Nasopharyngeal Swab; Nasopharyngeal(NP) swabs in vial transport medium  Result Value Ref Range Status   SARS Coronavirus 2 by RT PCR NEGATIVE NEGATIVE Final    Comment: (NOTE) SARS-CoV-2 target  nucleic acids are NOT DETECTED.  The SARS-CoV-2 RNA is generally detectable in upper respiratory specimens during the acute phase of infection. The lowest concentration of SARS-CoV-2 viral copies this assay can detect is 138 copies/mL. A negative result does not preclude SARS-Cov-2 infection and should not be used as the sole basis for treatment or other patient management decisions. A negative result may occur with  improper specimen collection/handling, submission of specimen other than nasopharyngeal swab, presence of viral mutation(s) within the areas targeted by this assay, and inadequate number of viral copies(<138 copies/mL). A negative result must be combined with clinical observations, patient history, and epidemiological information. The expected result is Negative.  Fact Sheet for Patients:  EntrepreneurPulse.com.au  Fact Sheet for Healthcare Providers:  IncredibleEmployment.be  This test is no t yet approved or cleared by the [place] FDA and  has been authorized for detection and/or diagnosis of SARS-CoV-2 by FDA under an Emergency Use Authorization (EUA). This EUA will remain  in effect (meaning this test can be used) for the duration of the COVID-19 declaration under Section 564(b)(1) of the Act, 21 U.S.C.section 360bbb-3(b)(1), unless the authorization is terminated  or revoked sooner.       Influenza A by PCR NEGATIVE NEGATIVE Final   Influenza B by PCR NEGATIVE NEGATIVE Final    Comment: (NOTE) The Xpert Xpress SARS-CoV-2/FLU/RSV plus assay is intended as an aid in the diagnosis of influenza from Nasopharyngeal swab specimens and should not be used as a sole basis for treatment. Nasal washings and aspirates are unacceptable for Xpert Xpress SARS-CoV-2/FLU/RSV testing.  Fact Sheet for Patients: EntrepreneurPulse.com.au  Fact Sheet for Healthcare Providers: IncredibleEmployment.be  This test is not yet approved or cleared by the [place] FDA and has been authorized for detection and/or diagnosis of SARS-CoV-2 by FDA under an Emergency Use Authorization (EUA). This EUA will remain in effect (meaning this test can be used) for the duration of the COVID-19 declaration under Section 564(b)(1) of the Act, 21 U.S.C. section 360bbb-3(b)(1), unless the authorization is terminated or revoked.  Performed at [facility], [address] [place], [place] [address]   MRSA Next Gen by PCR, Nasal     Status: None   Collection Time: 08/01/21  6:32 PM   Specimen: Nasal Mucosa; Nasal Swab  Result Value Ref Range Status   MRSA by PCR Next Gen NOT DETECTED NOT DETECTED Final    Comment: (NOTE) The GeneXpert MRSA Assay (FDA approved for NASAL specimens only), is one component of a comprehensive MRSA colonization surveillance program. It is not intended to diagnose MRSA infection nor to guide or monitor treatment for MRSA infections. Test performance is not FDA  approved in patients less than 59 years old. Performed at [facility], [address], [place], [place] [address]          Radiology Studies: PERIPHERAL VASCULAR CATHETERIZATION  Result Date: 08/01/2021 See surgical note for result.  CT Angio Abd/Pel W and/or Wo Contrast  Result Date: 08/01/2021 CLINICAL DATA:  GI bleeding. EXAM: CTA ABDOMEN AND PELVIS WITHOUT AND WITH CONTRAST TECHNIQUE: Multidetector CT imaging of the abdomen and pelvis was performed using the standard protocol during bolus administration of intravenous contrast. Multiplanar reconstructed images and MIPs were obtained and reviewed to evaluate the vascular anatomy. CONTRAST:  182m OMNIPAQUE IOHEXOL 350 MG/ML SOLN COMPARISON:  CT abdomen pelvis-06/30/2021; 11/22/2017; 06/12/2017; lumbar spine MRI-09/05/2020 FINDINGS: VASCULAR Aorta: Moderate amount eccentric mixed calcified and noncalcified atherosclerotic plaque scattered throughout a normal caliber abdominal  aorta, not resulting in a hemodynamically significant stenosis. No evidence of abdominal aortic dissection or periaortic stranding. Celiac: There is a minimal amount of eccentric mixed calcified and noncalcified atherosclerotic plaque involving the origin the celiac artery, not resulting in a hemodynamically significant stenosis. Conventional branching pattern. SMA: There is a minimal amount of eccentric predominantly calcified atherosclerotic plaque involving the origin the SMA, not resulting in a hemodynamically significant stenosis. Conventional branching pattern. Renals: Duplicated nearly codominant right renal arteries; there is a minimal amount of eccentric mixed calcified and noncalcified atherosclerotic plaque involving the origin of the left renal artery not definitely resulting in hemodynamically significant stenosis. No vessel irregularity to suggest FMD. IMA: Remains patent and likely serves as the arterial supply to the area of intraluminal contrast  extravasation involving the distal descending/proximal sigmoid colon. Inflow: There is a minimal amount of eccentric mixed calcified and noncalcified atherosclerotic plaque involving the bilateral common iliac arteries, not resulting in hemodynamically significant stenosis. The bilateral common and external iliac arteries are noted to be markedly tortuous though without a hemodynamically significant narrowing. The bilateral internal iliac arteries are disease though patent of normal caliber. Proximal Outflow: The bilateral common and imaged portions of the bilateral deep and superficial femoral arteries are of normal caliber and widely patent without hemodynamically significant narrowing. Veins: The IVC and pelvic venous systems appear widely patent. Review of the MIP images confirms the above findings. _________________________________________________________ NON-VASCULAR Lower chest: Limited visualization of the lower thorax demonstrates a large hernia involving the medial aspect of the right hemidiaphragm previously noted to contain a large portion of stomach however now contains a lower to portion of the transverse colon, interval change compared to the 06/30/2021 examination though without definitive evidence of upstream colonic dilatation or gaseous distension to suggest superimposed volvulus. Cardiomegaly. Pacer lead terminates within the right ventricular apex. Post left atrial appendage clipping, incompletely evaluated. Post median sternotomy. Calcified granuloma noted within the lingula. Hepatobiliary: Normal hepatic contour. There is a minimal amount of focal fatty infiltration adjacent to the fissure for the ligamentum teres. No discrete hepatic lesions. Normal appearance of the gallbladder given degree distention. No radiopaque gallstones. No intra extrahepatic bili duct dilatation. No ascites. Pancreas: Normal appearance of the pancreas. Spleen: Abnormal appearance of the spleen however the spleen is  again noted to be displaced anteriorly secondary to the large hiatal hernia. Adrenals/Urinary Tract: There is symmetric enhancement and excretion of the bilateral kidneys. Redemonstrated bilateral renal cysts, the largest of which arising from the anterior inferior aspect of the left kidney measuring approximately 9.1 x 8.0 cm (image 25, series 2). No evidence of nephrolithiasis. No discrete worrisome renal lesions. No urinary obstruction or perinephric stranding. Normal appearance of the bilateral adrenal glands. Normal appearance of the urinary bladder given degree of distention. Stomach/Bowel: There is intraluminal contrast extravasation involving the distal aspect of the descending colon/proximal. Sigmoid colon within the left lower abdomen/upper pelvis (axial image 120, series 5 with increased pooling on the acquired delayed venous phase images (representative image 47, series 6; coronal images 57 through 69, series 14). As above, limited visualization of the lower thorax demonstrates a large hernia involving the medial aspect of the right hemidiaphragm previously noted to contain a large portion of stomach however now contains a lower to portion of the transverse colon, interval change compared to the 06/30/2021 examination though without definitive evidence of upstream colonic dilatation or gaseous distension to suggest superimposed volvulus. Large stool burden without evidence of enteric obstruction. No pneumoperitoneum, pneumatosis or portal  venous gas. Lymphatic: No bulky retroperitoneal, mesenteric, pelvic or inguinal lymphadenopathy. Reproductive: Dystrophic calcifications within normal sized prostate gland. No free fluid the pelvic cul-de-sac. Other: Sequela bilateral inguinal hernia repairs without evidence of recurrence. Musculoskeletal: No acute or aggressive osseous abnormalities. Severe (approximately 75%) compression deformity involving the superior endplate of 624THL and moderate (approximately 50%)  compression deformity involving the superior endplate of L1 both appear similar to lumbar spine MRI performed 09/05/2020. Mild-to-moderate multilevel lumbar spine DDD, worse at L2-L3 and L4-L5 with disc space height loss, endplate irregularity and sclerosis. Moderate scoliotic curvature of the lumbar spine. Mild degenerative change of the bilateral hips with joint space loss, subchondral sclerosis and osteophytosis. IMPRESSION: 1. Examination is positive for acute intraluminal contrast extravasation within the distal descending/proximal sigmoid colon within the left lower abdomen/upper pelvis with arterial supply likely via the IMA distribution. 2. Large right-sided posteromedial diaphragmatic hernia now containing a large portion of the mid aspect of the transverse colon in addition to a large portion of the stomach, an interval progression compared to the 06/30/2021 examination though without evidence of volvulus or enteric obstruction. 3. Scattered atherosclerotic plaque within a tortuous but normal caliber abdominal aorta. 4. Moderate to severe T12 and L1 compression deformities, similar to the 09/05/2020 lumbar spine MRI. Critical Value/emergent results were called by telephone at the time of interpretation on 08/01/2021 at 1:56 pm to provider [facility] , who verbally acknowledged these results. Electronically Signed   By: Sandi Mariscal M.D.   On: 08/01/2021 14:09        Scheduled Meds:  sodium chloride   Intravenous Once   brimonidine  1 drop Both Eyes BID   Chlorhexidine Gluconate Cloth  6 each Topical Q0600   cholecalciferol  2,000 Units Oral Daily   ferrous sulfate  325 mg Oral QODAY   latanoprost  1 drop Both Eyes QHS   melatonin  2.5 mg Oral QHS   mirtazapine  7.5 mg Oral QHS   niacin  500 mg Oral Daily   pantoprazole (PROTONIX) IV  40 mg Intravenous Q24H   phytonadione  5 mg Subcutaneous Once   tamsulosin  0.4 mg Oral Daily   traZODone  50 mg Oral QHS   Continuous Infusions:   sodium chloride 100 mL/hr at 08/02/21 0700     LOS: 1 day    Time spent: 32 minutes, more than 50% time involved in direct patient care.    Sharen Hones, MD [facility]   To contact the attending provider between 7A-7P or the covering provider during after hours 7P-7A, please log into the web site [email or web address] and access using universal [place] password for that web site. If you do not have the password, please call the hospital operator.  08/02/2021, 9:35 AM

## 2021-08-03 DIAGNOSIS — K625 Hemorrhage of anus and rectum: Secondary | ICD-10-CM | POA: Diagnosis not present

## 2021-08-03 DIAGNOSIS — D62 Acute posthemorrhagic anemia: Secondary | ICD-10-CM

## 2021-08-03 DIAGNOSIS — Z952 Presence of prosthetic heart valve: Secondary | ICD-10-CM | POA: Diagnosis not present

## 2021-08-03 DIAGNOSIS — E876 Hypokalemia: Secondary | ICD-10-CM

## 2021-08-03 LAB — BPAM FFP
Blood Product Expiration Date: 202208292359
Blood Product Expiration Date: 202208292359
ISSUE DATE / TIME: 202208241107
ISSUE DATE / TIME: 202208241347
Unit Type and Rh: 5100
Unit Type and Rh: 5100

## 2021-08-03 LAB — FERRITIN: Ferritin: 61 ng/mL (ref 24–336)

## 2021-08-03 LAB — PREPARE FRESH FROZEN PLASMA: Unit division: 0

## 2021-08-03 LAB — HEMOGLOBIN AND HEMATOCRIT, BLOOD
HCT: 22.5 % — ABNORMAL LOW (ref 39.0–52.0)
HCT: 19.7 % — ABNORMAL LOW (ref 39.0–52.0)
HCT: 22.9 % — ABNORMAL LOW (ref 39.0–52.0)
Hemoglobin: 7.7 g/dL — ABNORMAL LOW (ref 13.0–17.0)
Hemoglobin: 6.7 g/dL — ABNORMAL LOW (ref 13.0–17.0)
Hemoglobin: 7.7 g/dL — ABNORMAL LOW (ref 13.0–17.0)

## 2021-08-03 LAB — BASIC METABOLIC PANEL
Anion gap: 8 (ref 5–15)
BUN: 19 mg/dL (ref 8–23)
CO2: 28 mmol/L (ref 22–32)
Calcium: 8 mg/dL — ABNORMAL LOW (ref 8.9–10.3)
Chloride: 105 mmol/L (ref 98–111)
Creatinine, Ser: 0.75 mg/dL (ref 0.61–1.24)
GFR, Estimated: >60 mL/min (ref >60)
Glucose, Bld: 113 mg/dL — ABNORMAL HIGH (ref 70–99)
Potassium: 2.8 mmol/L — ABNORMAL LOW (ref 3.5–5.1)
Sodium: 141 mmol/L (ref 135–145)

## 2021-08-03 LAB — VITAMIN B12: Vitamin B-12: 170 pg/mL — ABNORMAL LOW (ref 180–914)

## 2021-08-03 LAB — FOLATE: Folate: 11.4 ng/mL (ref >5.9)

## 2021-08-03 LAB — IRON AND TIBC
Iron: 27 ug/dL — ABNORMAL LOW (ref 45–182)
Saturation Ratios: 12 % — ABNORMAL LOW (ref 17.9–39.5)
TIBC: 235 ug/dL — ABNORMAL LOW (ref 250–450)
UIBC: 208 ug/dL

## 2021-08-03 LAB — MAGNESIUM: Magnesium: 1.7 mg/dL (ref 1.7–2.4)

## 2021-08-03 MED ORDER — SODIUM CHLORIDE 0.9 % IV SOLN
300.0000 mg | Freq: Once | INTRAVENOUS | Status: AC
  Administered 2021-08-03: 300 mg via INTRAVENOUS
  Filled 2021-08-03: qty 15

## 2021-08-03 MED ORDER — CYANOCOBALAMIN 1000 MCG/ML IJ SOLN
1000.0000 ug | Freq: Every day | INTRAMUSCULAR | Status: DC
  Administered 2021-08-03 – 2021-08-04 (×2): 1000 ug via INTRAMUSCULAR
  Filled 2021-08-03 (×2): qty 1

## 2021-08-03 MED ORDER — POTASSIUM CHLORIDE 10 MEQ/100ML IV SOLN
10.0000 meq | INTRAVENOUS | Status: AC
  Administered 2021-08-03 (×4): 10 meq via INTRAVENOUS
  Filled 2021-08-03 (×4): qty 100

## 2021-08-03 MED ORDER — FUROSEMIDE 10 MG/ML IJ SOLN
20.0000 mg | Freq: Once | INTRAMUSCULAR | Status: AC
  Administered 2021-08-03: 20 mg via INTRAVENOUS
  Filled 2021-08-03: qty 2

## 2021-08-03 MED ORDER — SODIUM CHLORIDE 0.9% IV SOLUTION: Freq: Once | INTRAVENOUS | Status: DC

## 2021-08-03 NOTE — Progress Notes (Addendum)
PROGRESS NOTE    Kevin [name]  [id number] DOB: 05-Jan-1925 DOA: 08/01/2021 PCP: Kirk Ruths, MD   Chief complaint: rectal bleeding Brief Narrative:  Kevin [name] is a 87 y.o. male with medical history significant for hypertension, dyslipidemia, coronary artery disease, atrial fibrillation, mechanical valve on chronic anticoagulation therapy with Coumadin and chronic diastolic dysfunction CHF who was sent to the ER via EMS for evaluation of rectal bleeding. The staff at the skilled nursing facility noted that he had bowel movements containing blood and so EMS was called. CT angiogram of abdomen/pelvis showed bleeding within the distal descending/proximal sigmoid colon. Mesentery embolization of the inferior mesenteric artery was performed by Dr. Ronalee Belts on 8/23.  Patient also received vitamin K injection for elevated INR.   Assessment & Plan:   Principal Problem:   ABLA (acute blood loss anemia) Active Problems:   History of mitral valve replacement with mechanical valve   Depression   Benign essential HTN   GERD without esophagitis   Coronary artery disease involving autologous vein coronary bypass graft with angina pectoris (HCC)   Rectal bleeding   Chronic diastolic CHF (congestive heart failure) (HCC)   Acute blood loss anemia (ABLA)   #1.  Acute blood loss anemia Rectal bleeding. Elevated INR due to warfarin. Patient is a status post embolization of inferior mesenteric artery. Patient received another unit of PRBC yesterday, hemoglobin increased to 7.5 today.   Patient no longer has fresh red blood per rectum, he had some darker red blood clots this morning.  Condition appears to be improving. Will transfer patient to regular medical floor. Discussed with Dr. Allen Norris, not planning for additional work-up.  #2.  Mechanic mitral valve. Coronary artery disease status post CABG. Chronic diastolic congestive heart failure. Patient received IV Lasix before and after  PRBC.  Currently, patient still has no volume overload.  Since GI has started patient on a diet, I will discontinue fluids. Not able to start anticoagulation until GI bleed is improved.  3.  Essential hypertension  Continue current treatment   4.  Dementia. Mental status appears to be baseline  5.  Hypokalemia  Will give 40 mEq of IV potassium  Goal of care discussion. Discussed with patient niece, POA, no additional work-up is desired.  We will continue to treat patient in the hospital, but no further testing or procedure will be performed.  If patient's bleeding stops, patient will be transferred to the memory unit with hospice care.  However, if condition deteriorates, patient will be transferred to inpatient hospice. She is also aware that anticoagulation cannot be started at this point due to active bleeding.  Patient may have problems with his heart.  1638. Hb 6.7. IV iron given, also start B12 injection daily x5. Transfuse 1 unit PRBC.   DVT prophylaxis: SCDs Code Status: DNR Family Communication: niece updated Disposition Plan:    Status is: Inpatient  Remains inpatient appropriate because:Inpatient level of care appropriate due to severity of illness  Dispo: The patient is from: Memory unit              Anticipated d/c is to: Home              Patient currently is not medically stable to d/c.   Difficult to place patient No        I/O last 3 completed shifts: In: 3438.6 [P.O.:100; I.V.:2733.6; Blood:605] Out: 2300 [Urine:2300] No intake/output data recorded.     Consultants:  GI  Procedures: Mesenteric embolization.  Antimicrobials: None Subjective: Spoke with the nurse, patient had some dark red blood clots with bowel movement, no fresh red blood per rectum anymore. Denies any abdominal pain or nausea vomiting, tolerating diet. Denies any short of breath or cough. No fever chills No dysuria hematuria  Objective: Vitals:   08/03/21 0300 08/03/21  0400 08/03/21 0500 08/03/21 0600  BP: 133/63 (!) 138/102  128/61  Pulse: 74 76 75 66  Resp: (!) '29 19 15 '$ (!) 28  Temp:  97.8 F (36.6 C)    TempSrc:  Oral    SpO2: 94% 99% 100% 98%  Weight:      Height:        Intake/Output Summary (Last 24 hours) at 08/03/2021 0747 Last data filed at 08/03/2021 0600 Gross per 24 hour  Intake 2535.05 ml  Output 2100 ml  Net 435.05 ml   Filed Weights   08/01/21 0949 08/01/21 1830  Weight: 59 kg 57.1 kg    Examination:  General exam: Appears calm and comfortable, appear pale.   Respiratory system: Clear to auscultation. Respiratory effort normal. Cardiovascular system: S1 & S2 heard, RRR. No JVD, murmurs, rubs, gallops or clicks. No pedal edema. Gastrointestinal system: Abdomen is nondistended, soft and nontender. No organomegaly or masses felt. Normal bowel sounds heard. Central nervous system: Alert and oriented x2. No focal neurological deficits. Extremities: Symmetric 5 x 5 power. Skin: No rashes, lesions or ulcers Psychiatry:  Mood & affect appropriate.     Data Reviewed: I have personally reviewed following labs and imaging studies  CBC: Recent Labs  Lab 08/01/21 0931 08/01/21 1534 08/01/21 2250 08/02/21 0846 08/02/21 1441 08/03/21 0520  WBC 4.9  --   --   --   --   --   HGB 10.8* 9.3* 9.4* 8.1* 6.5* 7.7*  HCT 31.7* 28.3* 27.8* 24.1* 19.2* 22.5*  MCV 102.9*  --   --   --   --   --   PLT 149*  --   --   --   --   --    Basic Metabolic Panel: Recent Labs  Lab 08/01/21 0931 08/03/21 0520  NA 143 141  K 4.2 2.8*  CL 106 105  CO2 28 28  GLUCOSE 116* 113*  BUN 26* 19  CREATININE 0.74 0.75  CALCIUM 9.1 8.0*  MG  --  1.7   GFR: Estimated Creatinine Clearance: 45.6 mL/min (by C-G formula based on SCr of 0.75 mg/dL). Liver Function Tests: Recent Labs  Lab 08/01/21 0931  AST 21  ALT 13  ALKPHOS 69  BILITOT 0.9  PROT 6.5  ALBUMIN 3.9   No results for input(s): LIPASE, AMYLASE in the last 168 hours. No results  for input(s): AMMONIA in the last 168 hours. Coagulation Profile: Recent Labs  Lab 08/01/21 0931 08/02/21 0439  INR 2.2* 2.5*   Cardiac Enzymes: No results for input(s): CKTOTAL, CKMB, CKMBINDEX, TROPONINI in the last 168 hours. BNP (last 3 results) No results for input(s): PROBNP in the last 8760 hours. HbA1C: No results for input(s): HGBA1C in the last 72 hours. CBG: Recent Labs  Lab 08/01/21 1820  GLUCAP 112*   Lipid Profile: No results for input(s): CHOL, HDL, LDLCALC, TRIG, CHOLHDL, LDLDIRECT in the last 72 hours. Thyroid Function Tests: No results for input(s): TSH, T4TOTAL, FREET4, T3FREE, THYROIDAB in the last 72 hours. Anemia Panel: No results for input(s): VITAMINB12, FOLATE, FERRITIN, TIBC, IRON, RETICCTPCT in the last 72 hours. Sepsis Labs: No results for input(s): PROCALCITON, LATICACIDVEN in the  last 168 hours.  Recent Results (from the past 240 hour(s))  Resp Panel by RT-PCR (Flu A&B, Covid) Nasopharyngeal Swab     Status: None   Collection Time: 08/01/21 11:46 AM   Specimen: Nasopharyngeal Swab; Nasopharyngeal(NP) swabs in vial transport medium  Result Value Ref Range Status   SARS Coronavirus 2 by RT PCR NEGATIVE NEGATIVE Final    Comment: (NOTE) SARS-CoV-2 target nucleic acids are NOT DETECTED.  The SARS-CoV-2 RNA is generally detectable in upper respiratory specimens during the acute phase of infection. The lowest concentration of SARS-CoV-2 viral copies this assay can detect is 138 copies/mL. A negative result does not preclude SARS-Cov-2 infection and should not be used as the sole basis for treatment or other patient management decisions. A negative result may occur with  improper specimen collection/handling, submission of specimen other than nasopharyngeal swab, presence of viral mutation(s) within the areas targeted by this assay, and inadequate number of viral copies(<138 copies/mL). A negative result must be combined with clinical  observations, patient history, and epidemiological information. The expected result is Negative.  Fact Sheet for Patients:  EntrepreneurPulse.com.au  Fact Sheet for Healthcare Providers:  IncredibleEmployment.be  This test is no t yet approved or cleared by the [place] FDA and  has been authorized for detection and/or diagnosis of SARS-CoV-2 by FDA under an Emergency Use Authorization (EUA). This EUA will remain  in effect (meaning this test can be used) for the duration of the COVID-19 declaration under Section 564(b)(1) of the Act, 21 U.S.C.section 360bbb-3(b)(1), unless the authorization is terminated  or revoked sooner.       Influenza A by PCR NEGATIVE NEGATIVE Final   Influenza B by PCR NEGATIVE NEGATIVE Final    Comment: (NOTE) The Xpert Xpress SARS-CoV-2/FLU/RSV plus assay is intended as an aid in the diagnosis of influenza from Nasopharyngeal swab specimens and should not be used as a sole basis for treatment. Nasal washings and aspirates are unacceptable for Xpert Xpress SARS-CoV-2/FLU/RSV testing.  Fact Sheet for Patients: EntrepreneurPulse.com.au  Fact Sheet for Healthcare Providers: IncredibleEmployment.be  This test is not yet approved or cleared by the [place] FDA and has been authorized for detection and/or diagnosis of SARS-CoV-2 by FDA under an Emergency Use Authorization (EUA). This EUA will remain in effect (meaning this test can be used) for the duration of the COVID-19 declaration under Section 564(b)(1) of the Act, 21 U.S.C. section 360bbb-3(b)(1), unless the authorization is terminated or revoked.  Performed at [facility], [address] [place], [place] [address]   MRSA Next Gen by PCR, Nasal     Status: None   Collection Time: 08/01/21  6:32 PM   Specimen: Nasal Mucosa; Nasal Swab  Result Value Ref Range Status   MRSA by PCR Next Gen NOT DETECTED  NOT DETECTED Final    Comment: (NOTE) The GeneXpert MRSA Assay (FDA approved for NASAL specimens only), is one component of a comprehensive MRSA colonization surveillance program. It is not intended to diagnose MRSA infection nor to guide or monitor treatment for MRSA infections. Test performance is not FDA approved in patients less than 83 years old. Performed at [facility], [address], [place], [place] [address]          Radiology Studies: PERIPHERAL VASCULAR CATHETERIZATION  Result Date: 08/01/2021 See surgical note for result.  CT Angio Abd/Pel W and/or Wo Contrast  Result Date: 08/01/2021 CLINICAL DATA:  GI bleeding. EXAM: CTA ABDOMEN AND PELVIS WITHOUT AND WITH CONTRAST TECHNIQUE: Multidetector  CT imaging of the abdomen and pelvis was performed using the standard protocol during bolus administration of intravenous contrast. Multiplanar reconstructed images and MIPs were obtained and reviewed to evaluate the vascular anatomy. CONTRAST:  164m OMNIPAQUE IOHEXOL 350 MG/ML SOLN COMPARISON:  CT abdomen pelvis-06/30/2021; 11/22/2017; 06/12/2017; lumbar spine MRI-09/05/2020 FINDINGS: VASCULAR Aorta: Moderate amount eccentric mixed calcified and noncalcified atherosclerotic plaque scattered throughout a normal caliber abdominal aorta, not resulting in a hemodynamically significant stenosis. No evidence of abdominal aortic dissection or periaortic stranding. Celiac: There is a minimal amount of eccentric mixed calcified and noncalcified atherosclerotic plaque involving the origin the celiac artery, not resulting in a hemodynamically significant stenosis. Conventional branching pattern. SMA: There is a minimal amount of eccentric predominantly calcified atherosclerotic plaque involving the origin the SMA, not resulting in a hemodynamically significant stenosis. Conventional branching pattern. Renals: Duplicated nearly codominant right renal arteries; there is a minimal amount of  eccentric mixed calcified and noncalcified atherosclerotic plaque involving the origin of the left renal artery not definitely resulting in hemodynamically significant stenosis. No vessel irregularity to suggest FMD. IMA: Remains patent and likely serves as the arterial supply to the area of intraluminal contrast extravasation involving the distal descending/proximal sigmoid colon. Inflow: There is a minimal amount of eccentric mixed calcified and noncalcified atherosclerotic plaque involving the bilateral common iliac arteries, not resulting in hemodynamically significant stenosis. The bilateral common and external iliac arteries are noted to be markedly tortuous though without a hemodynamically significant narrowing. The bilateral internal iliac arteries are disease though patent of normal caliber. Proximal Outflow: The bilateral common and imaged portions of the bilateral deep and superficial femoral arteries are of normal caliber and widely patent without hemodynamically significant narrowing. Veins: The IVC and pelvic venous systems appear widely patent. Review of the MIP images confirms the above findings. _________________________________________________________ NON-VASCULAR Lower chest: Limited visualization of the lower thorax demonstrates a large hernia involving the medial aspect of the right hemidiaphragm previously noted to contain a large portion of stomach however now contains a lower to portion of the transverse colon, interval change compared to the 06/30/2021 examination though without definitive evidence of upstream colonic dilatation or gaseous distension to suggest superimposed volvulus. Cardiomegaly. Pacer lead terminates within the right ventricular apex. Post left atrial appendage clipping, incompletely evaluated. Post median sternotomy. Calcified granuloma noted within the lingula. Hepatobiliary: Normal hepatic contour. There is a minimal amount of focal fatty infiltration adjacent to the  fissure for the ligamentum teres. No discrete hepatic lesions. Normal appearance of the gallbladder given degree distention. No radiopaque gallstones. No intra extrahepatic bili duct dilatation. No ascites. Pancreas: Normal appearance of the pancreas. Spleen: Abnormal appearance of the spleen however the spleen is again noted to be displaced anteriorly secondary to the large hiatal hernia. Adrenals/Urinary Tract: There is symmetric enhancement and excretion of the bilateral kidneys. Redemonstrated bilateral renal cysts, the largest of which arising from the anterior inferior aspect of the left kidney measuring approximately 9.1 x 8.0 cm (image 25, series 2). No evidence of nephrolithiasis. No discrete worrisome renal lesions. No urinary obstruction or perinephric stranding. Normal appearance of the bilateral adrenal glands. Normal appearance of the urinary bladder given degree of distention. Stomach/Bowel: There is intraluminal contrast extravasation involving the distal aspect of the descending colon/proximal. Sigmoid colon within the left lower abdomen/upper pelvis (axial image 120, series 5 with increased pooling on the acquired delayed venous phase images (representative image 47, series 6; coronal images 57 through 69, series 14). As above, limited visualization of the  lower thorax demonstrates a large hernia involving the medial aspect of the right hemidiaphragm previously noted to contain a large portion of stomach however now contains a lower to portion of the transverse colon, interval change compared to the 06/30/2021 examination though without definitive evidence of upstream colonic dilatation or gaseous distension to suggest superimposed volvulus. Large stool burden without evidence of enteric obstruction. No pneumoperitoneum, pneumatosis or portal venous gas. Lymphatic: No bulky retroperitoneal, mesenteric, pelvic or inguinal lymphadenopathy. Reproductive: Dystrophic calcifications within normal sized  prostate gland. No free fluid the pelvic cul-de-sac. Other: Sequela bilateral inguinal hernia repairs without evidence of recurrence. Musculoskeletal: No acute or aggressive osseous abnormalities. Severe (approximately 75%) compression deformity involving the superior endplate of 624THL and moderate (approximately 50%) compression deformity involving the superior endplate of L1 both appear similar to lumbar spine MRI performed 09/05/2020. Mild-to-moderate multilevel lumbar spine DDD, worse at L2-L3 and L4-L5 with disc space height loss, endplate irregularity and sclerosis. Moderate scoliotic curvature of the lumbar spine. Mild degenerative change of the bilateral hips with joint space loss, subchondral sclerosis and osteophytosis. IMPRESSION: 1. Examination is positive for acute intraluminal contrast extravasation within the distal descending/proximal sigmoid colon within the left lower abdomen/upper pelvis with arterial supply likely via the IMA distribution. 2. Large right-sided posteromedial diaphragmatic hernia now containing a large portion of the mid aspect of the transverse colon in addition to a large portion of the stomach, an interval progression compared to the 06/30/2021 examination though without evidence of volvulus or enteric obstruction. 3. Scattered atherosclerotic plaque within a tortuous but normal caliber abdominal aorta. 4. Moderate to severe T12 and L1 compression deformities, similar to the 09/05/2020 lumbar spine MRI. Critical Value/emergent results were called by telephone at the time of interpretation on 08/01/2021 at 1:56 pm to provider [facility] , who verbally acknowledged these results. Electronically Signed   By: Sandi Mariscal M.D.   On: 08/01/2021 14:09        Scheduled Meds:  sodium chloride   Intravenous Once   brimonidine  1 drop Both Eyes BID   Chlorhexidine Gluconate Cloth  6 each Topical Q0600   cholecalciferol  2,000 Units Oral Daily   ferrous sulfate  325 mg Oral  QODAY   latanoprost  1 drop Both Eyes QHS   melatonin  2.5 mg Oral QHS   mirtazapine  7.5 mg Oral QHS   niacin  500 mg Oral Daily   pantoprazole (PROTONIX) IV  40 mg Intravenous Q24H   phytonadione  5 mg Subcutaneous Once   polyethylene glycol  17 g Oral BID   tamsulosin  0.4 mg Oral Daily   traZODone  50 mg Oral QHS   Continuous Infusions:   LOS: 2 days    Time spent: 32 minutes, more than 50% time spent on direct patient care.    Sharen Hones, MD [facility]   To contact the attending provider between 7A-7P or the covering provider during after hours 7P-7A, please log into the web site [email or web address] and access using universal [place] password for that web site. If you do not have the password, please call the hospital operator.  08/03/2021, 7:47 AM

## 2021-08-03 NOTE — Progress Notes (Addendum)
Kevin Lame, MD [facility]   [address] [place], [place] [address] Phone: [phone] Fax : [phone]   Subjective: The patient is in his room in no apparent distress laying in bed.  The patient is unable to answer any questions due to advanced dementia.  The patient has had bright red blood per rectum and maroon stools overnight but it has "turned much darker this morning with less bleeding.  The patient was transfused and is status post embolization of the inferior mesenteric artery by vascular surgery.  They have signed off the case.  The patient was thought to likely have a diverticular bleed.   Objective: Vital signs in last 24 hours: Vitals:   08/03/21 0400 08/03/21 0500 08/03/21 0600 08/03/21 0800  BP: (!) 138/102  128/61 109/77  Pulse: 76 75 66 70  Resp: 19 15 (!) 28 (!) 22  Temp: 97.8 F (36.6 C)   98.5 F (36.9 C)  TempSrc: Oral   Axillary  SpO2: 99% 100% 98% 99%  Weight:      Height:       Weight change:   Intake/Output Summary (Last 24 hours) at 08/03/2021 1114 Last data filed at 08/03/2021 1007 Gross per 24 hour  Intake 2609.24 ml  Output 2500 ml  Net 109.24 ml     Exam: Heart:: Regular rate and rhythm Lungs: normal and clear to auscultation and percussion Abdomen: soft, nontender, normal bowel sounds   Lab Results: '@LABTEST2'$ @ Micro Results: Recent Results (from the past 240 hour(s))  Resp Panel by RT-PCR (Flu A&B, Covid) Nasopharyngeal Swab     Status: None   Collection Time: 08/01/21 11:46 AM   Specimen: Nasopharyngeal Swab; Nasopharyngeal(NP) swabs in vial transport medium  Result Value Ref Range Status   SARS Coronavirus 2 by RT PCR NEGATIVE NEGATIVE Final    Comment: (NOTE) SARS-CoV-2 target nucleic acids are NOT DETECTED.  The SARS-CoV-2 RNA is generally detectable in upper respiratory specimens during the acute phase of infection. The lowest concentration of SARS-CoV-2 viral copies this assay can detect is 138 copies/mL. A  negative result does not preclude SARS-Cov-2 infection and should not be used as the sole basis for treatment or other patient management decisions. A negative result may occur with  improper specimen collection/handling, submission of specimen other than nasopharyngeal swab, presence of viral mutation(s) within the areas targeted by this assay, and inadequate number of viral copies(<138 copies/mL). A negative result must be combined with clinical observations, patient history, and epidemiological information. The expected result is Negative.  Fact Sheet for Patients:  EntrepreneurPulse.com.au  Fact Sheet for Healthcare Providers:  IncredibleEmployment.be  This test is no t yet approved or cleared by the [place] FDA and  has been authorized for detection and/or diagnosis of SARS-CoV-2 by FDA under an Emergency Use Authorization (EUA). This EUA will remain  in effect (meaning this test can be used) for the duration of the COVID-19 declaration under Section 564(b)(1) of the Act, 21 U.S.C.section 360bbb-3(b)(1), unless the authorization is terminated  or revoked sooner.       Influenza A by PCR NEGATIVE NEGATIVE Final   Influenza B by PCR NEGATIVE NEGATIVE Final    Comment: (NOTE) The Xpert Xpress SARS-CoV-2/FLU/RSV plus assay is intended as an aid in the diagnosis of influenza from Nasopharyngeal swab specimens and should not be used as a sole basis for treatment. Nasal washings and aspirates are unacceptable for Xpert Xpress SARS-CoV-2/FLU/RSV testing.  Fact Sheet for Patients: EntrepreneurPulse.com.au  Fact Sheet for Healthcare  Providers: IncredibleEmployment.be  This test is not yet approved or cleared by the [place] and has been authorized for detection and/or diagnosis of SARS-CoV-2 by FDA under an Emergency Use Authorization (EUA). This EUA will remain in effect (meaning this test can  be used) for the duration of the COVID-19 declaration under Section 564(b)(1) of the Act, 21 U.S.C. section 360bbb-3(b)(1), unless the authorization is terminated or revoked.  Performed at [facility], [address] [place], [place] [address]   MRSA Next Gen by PCR, Nasal     Status: None   Collection Time: 08/01/21  6:32 PM   Specimen: Nasal Mucosa; Nasal Swab  Result Value Ref Range Status   MRSA by PCR Next Gen NOT DETECTED NOT DETECTED Final    Comment: (NOTE) The GeneXpert MRSA Assay (FDA approved for NASAL specimens only), is one component of a comprehensive MRSA colonization surveillance program. It is not intended to diagnose MRSA infection nor to guide or monitor treatment for MRSA infections. Test performance is not FDA approved in patients less than 83 years old. Performed at [facility], [address] [place], [place] [address]    Studies/Results: PERIPHERAL VASCULAR CATHETERIZATION  Result Date: 08/01/2021 See surgical note for result.  CT Angio Abd/Pel W and/or Wo Contrast  Result Date: 08/01/2021 CLINICAL DATA:  GI bleeding. EXAM: CTA ABDOMEN AND PELVIS WITHOUT AND WITH CONTRAST TECHNIQUE: Multidetector CT imaging of the abdomen and pelvis was performed using the standard protocol during bolus administration of intravenous contrast. Multiplanar reconstructed images and MIPs were obtained and reviewed to evaluate the vascular anatomy. CONTRAST:  136m OMNIPAQUE IOHEXOL 350 MG/ML SOLN COMPARISON:  CT abdomen pelvis-06/30/2021; 11/22/2017; 06/12/2017; lumbar spine MRI-09/05/2020 FINDINGS: VASCULAR Aorta: Moderate amount eccentric mixed calcified and noncalcified atherosclerotic plaque scattered throughout a normal caliber abdominal aorta, not resulting in a hemodynamically significant stenosis. No evidence of abdominal aortic dissection or periaortic stranding. Celiac: There is a minimal amount of eccentric mixed calcified and noncalcified  atherosclerotic plaque involving the origin the celiac artery, not resulting in a hemodynamically significant stenosis. Conventional branching pattern. SMA: There is a minimal amount of eccentric predominantly calcified atherosclerotic plaque involving the origin the SMA, not resulting in a hemodynamically significant stenosis. Conventional branching pattern. Renals: Duplicated nearly codominant right renal arteries; there is a minimal amount of eccentric mixed calcified and noncalcified atherosclerotic plaque involving the origin of the left renal artery not definitely resulting in hemodynamically significant stenosis. No vessel irregularity to suggest FMD. IMA: Remains patent and likely serves as the arterial supply to the area of intraluminal contrast extravasation involving the distal descending/proximal sigmoid colon. Inflow: There is a minimal amount of eccentric mixed calcified and noncalcified atherosclerotic plaque involving the bilateral common iliac arteries, not resulting in hemodynamically significant stenosis. The bilateral common and external iliac arteries are noted to be markedly tortuous though without a hemodynamically significant narrowing. The bilateral internal iliac arteries are disease though patent of normal caliber. Proximal Outflow: The bilateral common and imaged portions of the bilateral deep and superficial femoral arteries are of normal caliber and widely patent without hemodynamically significant narrowing. Veins: The IVC and pelvic venous systems appear widely patent. Review of the MIP images confirms the above findings. _________________________________________________________ NON-VASCULAR Lower chest: Limited visualization of the lower thorax demonstrates a large hernia involving the medial aspect of the right hemidiaphragm previously noted to contain a large portion of stomach however now contains a lower to portion of the transverse colon, interval change compared to the  06/30/2021 examination though without definitive evidence of upstream colonic dilatation or gaseous distension to suggest superimposed volvulus. Cardiomegaly. Pacer lead terminates within the right ventricular apex. Post left atrial appendage clipping, incompletely evaluated. Post median sternotomy. Calcified granuloma noted within the lingula. Hepatobiliary: Normal hepatic contour. There is a minimal amount of focal fatty infiltration adjacent to the fissure for the ligamentum teres. No discrete hepatic lesions. Normal appearance of the gallbladder given degree distention. No radiopaque gallstones. No intra extrahepatic bili duct dilatation. No ascites. Pancreas: Normal appearance of the pancreas. Spleen: Abnormal appearance of the spleen however the spleen is again noted to be displaced anteriorly secondary to the large hiatal hernia. Adrenals/Urinary Tract: There is symmetric enhancement and excretion of the bilateral kidneys. Redemonstrated bilateral renal cysts, the largest of which arising from the anterior inferior aspect of the left kidney measuring approximately 9.1 x 8.0 cm (image 25, series 2). No evidence of nephrolithiasis. No discrete worrisome renal lesions. No urinary obstruction or perinephric stranding. Normal appearance of the bilateral adrenal glands. Normal appearance of the urinary bladder given degree of distention. Stomach/Bowel: There is intraluminal contrast extravasation involving the distal aspect of the descending colon/proximal. Sigmoid colon within the left lower abdomen/upper pelvis (axial image 120, series 5 with increased pooling on the acquired delayed venous phase images (representative image 47, series 6; coronal images 57 through 69, series 14). As above, limited visualization of the lower thorax demonstrates a large hernia involving the medial aspect of the right hemidiaphragm previously noted to contain a large portion of stomach however now contains a lower to portion of the  transverse colon, interval change compared to the 06/30/2021 examination though without definitive evidence of upstream colonic dilatation or gaseous distension to suggest superimposed volvulus. Large stool burden without evidence of enteric obstruction. No pneumoperitoneum, pneumatosis or portal venous gas. Lymphatic: No bulky retroperitoneal, mesenteric, pelvic or inguinal lymphadenopathy. Reproductive: Dystrophic calcifications within normal sized prostate gland. No free fluid the pelvic cul-de-sac. Other: Sequela bilateral inguinal hernia repairs without evidence of recurrence. Musculoskeletal: No acute or aggressive osseous abnormalities. Severe (approximately 75%) compression deformity involving the superior endplate of 624THL and moderate (approximately 50%) compression deformity involving the superior endplate of L1 both appear similar to lumbar spine MRI performed 09/05/2020. Mild-to-moderate multilevel lumbar spine DDD, worse at L2-L3 and L4-L5 with disc space height loss, endplate irregularity and sclerosis. Moderate scoliotic curvature of the lumbar spine. Mild degenerative change of the bilateral hips with joint space loss, subchondral sclerosis and osteophytosis. IMPRESSION: 1. Examination is positive for acute intraluminal contrast extravasation within the distal descending/proximal sigmoid colon within the left lower abdomen/upper pelvis with arterial supply likely via the IMA distribution. 2. Large right-sided posteromedial diaphragmatic hernia now containing a large portion of the mid aspect of the transverse colon in addition to a large portion of the stomach, an interval progression compared to the 06/30/2021 examination though without evidence of volvulus or enteric obstruction. 3. Scattered atherosclerotic plaque within a tortuous but normal caliber abdominal aorta. 4. Moderate to severe T12 and L1 compression deformities, similar to the 09/05/2020 lumbar spine MRI. Critical Value/emergent results  were called by telephone at the time of interpretation on 08/01/2021 at 1:56 pm to provider [facility] , who verbally acknowledged these results. Electronically Signed   By: Sandi Mariscal M.D.   On: 08/01/2021 14:09   Medications: I have reviewed the patient's current medications. Scheduled Meds:  sodium chloride   Intravenous Once   brimonidine  1 drop Both Eyes BID  Chlorhexidine Gluconate Cloth  6 each Topical Q0600   cholecalciferol  2,000 Units Oral Daily   ferrous sulfate  325 mg Oral QODAY   latanoprost  1 drop Both Eyes QHS   melatonin  2.5 mg Oral QHS   mirtazapine  7.5 mg Oral QHS   niacin  500 mg Oral Daily   pantoprazole (PROTONIX) IV  40 mg Intravenous Q24H   polyethylene glycol  17 g Oral BID   tamsulosin  0.4 mg Oral Daily   traZODone  50 mg Oral QHS   Continuous Infusions:  iron sucrose     potassium chloride 10 mEq (08/03/21 1055)   PRN Meds:.acetaminophen, haloperidol lactate, ondansetron **OR** ondansetron (ZOFRAN) IV   Assessment: Principal Problem:   ABLA (acute blood loss anemia) Active Problems:   History of mitral valve replacement with mechanical valve   Depression   Benign essential HTN   GERD without esophagitis   Coronary artery disease involving autologous vein coronary bypass graft with angina pectoris (HCC)   Rectal bleeding   Chronic diastolic CHF (congestive heart failure) (HCC)   Acute blood loss anemia (ABLA)   Hypokalemia    Plan: This patient likely has a diverticular bleed which is not amenable to endoscopic intervention.  The patient also is unable to take a prep and his present condition with his dementia.  I have spoken to the niece who makes his decisions and have explained that we can put an NG tube in and that he would likely have to be restrained due to his dementia while giving of prep through the NG tube.  The patient's niece has been explained the risks and benefits including aspiration perforation and possible inability to  stop the bleeding.  The patient's niece states that she does not want any further intervention at this time.  She states that she agreed to the embolization because she thought it would help but otherwise she is not willing to subject the patient to any further invasive procedures.  I discussed the case with the hospitalist.  Nothing further to do from a GI point of view except transfuse as needed and treat symptomatically.  I will sign off.  Please call if any further GI concerns or questions.  We would like to thank you for the opportunity to participate in the care of Kevin [name].    LOS: 2 days   Lewayne Bunting 08/03/2021, 11:14 AM Pager [phone] 7am-5pm  Check AMION for 5pm -7am coverage and on weekends

## 2021-08-04 DIAGNOSIS — K625 Hemorrhage of anus and rectum: Secondary | ICD-10-CM | POA: Diagnosis not present

## 2021-08-04 DIAGNOSIS — Z952 Presence of prosthetic heart valve: Secondary | ICD-10-CM | POA: Diagnosis not present

## 2021-08-04 DIAGNOSIS — I5032 Chronic diastolic (congestive) heart failure: Secondary | ICD-10-CM | POA: Diagnosis not present

## 2021-08-04 DIAGNOSIS — D62 Acute posthemorrhagic anemia: Secondary | ICD-10-CM | POA: Diagnosis not present

## 2021-08-04 LAB — BASIC METABOLIC PANEL
Anion gap: 8 (ref 5–15)
BUN: 13 mg/dL (ref 8–23)
CO2: 26 mmol/L (ref 22–32)
Calcium: 8.1 mg/dL — ABNORMAL LOW (ref 8.9–10.3)
Chloride: 104 mmol/L (ref 98–111)
Creatinine, Ser: 0.71 mg/dL (ref 0.61–1.24)
GFR, Estimated: >60 mL/min (ref >60)
Glucose, Bld: 92 mg/dL (ref 70–99)
Potassium: 3 mmol/L — ABNORMAL LOW (ref 3.5–5.1)
Sodium: 138 mmol/L (ref 135–145)

## 2021-08-04 LAB — HEMOGLOBIN AND HEMATOCRIT, BLOOD
HCT: 25.5 % — ABNORMAL LOW (ref 39.0–52.0)
HCT: 26.2 % — ABNORMAL LOW (ref 39.0–52.0)
Hemoglobin: 8.9 g/dL — ABNORMAL LOW (ref 13.0–17.0)
Hemoglobin: 8.8 g/dL — ABNORMAL LOW (ref 13.0–17.0)

## 2021-08-04 LAB — MAGNESIUM: Magnesium: 1.7 mg/dL (ref 1.7–2.4)

## 2021-08-04 LAB — PREPARE RBC (CROSSMATCH)

## 2021-08-04 MED ORDER — POTASSIUM CHLORIDE 10 MEQ/100ML IV SOLN
10.0000 meq | INTRAVENOUS | Status: AC
  Administered 2021-08-04 (×2): 10 meq via INTRAVENOUS
  Filled 2021-08-04 (×2): qty 100

## 2021-08-04 MED ORDER — QUETIAPINE FUMARATE 25 MG PO TABS: 25.0000 mg | ORAL_TABLET | Freq: Every day | ORAL | Status: DC

## 2021-08-04 MED ORDER — VITAMIN B-12 1000 MCG PO TABS: 1000.0000 ug | ORAL_TABLET | Freq: Every day | ORAL | Status: AC

## 2021-08-04 MED ORDER — HALOPERIDOL 1 MG PO TABS: 1.0000 mg | ORAL_TABLET | Freq: Three times a day (TID) | ORAL | 0 refills | Status: AC | PRN

## 2021-08-04 MED ORDER — QUETIAPINE FUMARATE 25 MG PO TABS: 25.0000 mg | ORAL_TABLET | Freq: Every day | ORAL | 0 refills | Status: AC

## 2021-08-04 MED ORDER — POTASSIUM CHLORIDE CRYS ER 20 MEQ PO TBCR
40.0000 meq | EXTENDED_RELEASE_TABLET | ORAL | Status: DC
Start: 2021-08-04 — End: 2021-08-04

## 2021-08-04 MED ORDER — POTASSIUM CHLORIDE 10 MEQ/100ML IV SOLN: 10.0000 meq | INTRAVENOUS | Status: DC

## 2021-08-04 MED ORDER — POTASSIUM CHLORIDE CRYS ER 20 MEQ PO TBCR
40.0000 meq | EXTENDED_RELEASE_TABLET | ORAL | Status: AC
  Administered 2021-08-04 (×2): 40 meq via ORAL
  Filled 2021-08-04 (×2): qty 2

## 2021-08-04 NOTE — NC FL2 (Signed)
[place] LEVEL OF CARE SCREENING TOOL     IDENTIFICATION  Patient Name: Kevin [name] Birthdate: 1924-12-30 Sex: male Admission Date (Current Location): 08/01/2021  [facility] and [place] Number:  Engineering geologist and Address:  [facility], [address] [place], [place] [address]      Provider Number: [id number]  Attending Physician Name and Address:  Sharen Hones, MD  Relative Name and Phone Number:       Current Level of Care: Hospital Recommended Level of Care: [place] (with hospice through [facility]) Prior Approval Number:    Date Approved/Denied:   [place] Number: [id number]  Discharge Plan: SNF (with hospice through [facility].)    Current Diagnoses: Patient Active Problem List   Diagnosis Date Noted   Hypokalemia 08/03/2021   Rectal bleeding 08/01/2021   Chronic diastolic CHF (congestive heart failure) ([place]) 08/01/2021   Acute blood loss anemia (ABLA) 08/01/2021   Uncontrolled hypertension 05/25/2019   Insomnia 05/25/2019   Pacemaker [id number]   Chronic systolic heart failure ([place]) 10/18/2018   Hypertensive heart disease with systolic congestive heart failure ([place]) 10/18/2018   Coronary artery disease involving autologous vein coronary bypass graft with angina pectoris ([place]) 10/18/2018   Chronic idiopathic constipation 10/18/2018   Chronic angle-closure glaucoma, bilateral 10/18/2018   Right leg pain 09/15/2018   Cognitive decline 06/19/2018   Protein-calorie malnutrition ([place]) 06/09/2018   Primary cancer of skin of forehead 05/27/2018   Jaw hematoma 05/14/2018   Ringworm of foot 04/15/2018   Hyponatremia 03/08/2018   Benign prostatic hyperplasia with urinary hesitancy 02/21/2018   Constipation 12/12/2017   Large hiatal hernia 12/12/2017   Encounter for preventive health examination 03/30/2017   Generalized muscle weakness 07/24/2016   Major depressive disorder, recurrent episode ([place])  01/24/2016   Vitamin D deficiency 01/24/2016   Fatigue 01/23/2016   Benign essential HTN 03/04/2015   GERD without esophagitis 03/04/2015   H/O prosthetic heart valve 03/04/2015   Dizziness 02/21/2015   Cardiomyopathy ([place]) 03/09/2014   Hyperlipidemia 04/27/2013   Sciatica of right side 03/16/2013   Anemia, iron deficiency 09/15/2012   Osteoporosis, senile 09/15/2012   Atrial flutter ([place]) 06/26/2012   ABLA (acute blood loss anemia) 06/14/2012   Depression 12/24/2011   Neuropathy of lower extremity 08/25/2011   Atrial fibrillation ([place])    History of mitral valve replacement with mechanical valve    Long term current use of anticoagulants with INR goal of 2.5-3.5 08/22/2011    Orientation RESPIRATION BLADDER Height & Weight      (Disoriented x 4)  Normal Incontinent, External catheter Weight: 125 lb 14.1 oz (57.1 kg) Height:  '5\' 7"'$  (170.2 cm)  BEHAVIORAL SYMPTOMS/MOOD NEUROLOGICAL BOWEL NUTRITION STATUS   (Restless at night)  (None) Incontinent Diet (Low sodium heart healthy)  AMBULATORY STATUS COMMUNICATION OF NEEDS Skin     Verbally Skin abrasions, Bruising                       Personal Care Assistance Level of Assistance              Functional Limitations Info  Sight, Hearing, Speech Sight Info: Adequate Hearing Info: Adequate Speech Info: Adequate    SPECIAL CARE FACTORS FREQUENCY                       Contractures Contractures Info: Not present    Additional Factors Info  Code Status, Allergies, Psychotropic Code Status Info: DNR Allergies  Info: Aspirin, Cymbalta (Duloxetine Hcl) Psychotropic Info: Depression         Current Medications (08/04/2021):  This is the current hospital active medication list Current Facility-Administered Medications  Medication Dose Route Frequency Provider Last Rate Last Admin   0.9 %  sodium chloride infusion (Manually program via Guardrails IV Fluids)   Intravenous Once Sharen Hones, MD       0.9 %   sodium chloride infusion (Manually program via Guardrails IV Fluids)   Intravenous Once Sharen Hones, MD       acetaminophen (TYLENOL) tablet 650 mg  650 mg Oral Q4H PRN Schnier, Dolores Lory, MD   650 mg at 08/02/21 0856   brimonidine (ALPHAGAN) 0.15 % ophthalmic solution 1 drop  1 drop Both Eyes BID Schnier, Dolores Lory, MD   1 drop at 08/04/21 [id number]   Chlorhexidine Gluconate Cloth 2 % PADS 6 each  6 each Topical Q0600 Agbata, Tochukwu, MD   6 each at 08/03/21 0501   cholecalciferol (VITAMIN D3) tablet 2,000 Units  2,000 Units Oral Daily Katha Cabal, MD   2,000 Units at 08/04/21 [id number]   cyanocobalamin ((VITAMIN B-12)) injection 1,000 mcg  1,000 mcg Intramuscular Daily Sharen Hones, MD   1,000 mcg at 08/04/21 [id number]   ferrous sulfate tablet 325 mg  325 mg Oral QODAY Schnier, Dolores Lory, MD   325 mg at 08/03/21 [id number]   haloperidol lactate (HALDOL) injection 1 mg  1 mg Intravenous Q6H PRN Sharen Hones, MD   1 mg at 08/02/21 1335   latanoprost (XALATAN) 0.005 % ophthalmic solution 1 drop  1 drop Both Eyes QHS Schnier, Dolores Lory, MD   1 drop at 08/04/21 0037   melatonin tablet 2.5 mg  2.5 mg Oral QHS Schnier, Dolores Lory, MD   2.5 mg at 08/04/21 0034   mirtazapine (REMERON) tablet 7.5 mg  7.5 mg Oral QHS Schnier, Dolores Lory, MD   7.5 mg at 08/04/21 0033   niacin (SLO-NIACIN) CR tablet 500 mg  500 mg Oral Daily Schnier, Dolores Lory, MD   500 mg at 08/04/21 0933   ondansetron (ZOFRAN) tablet 4 mg  4 mg Oral Q6H PRN Schnier, Dolores Lory, MD       Or   ondansetron [facility]) injection 4 mg  4 mg Intravenous Q6H PRN Schnier, Dolores Lory, MD       pantoprazole (PROTONIX) injection 40 mg  40 mg Intravenous Q24H Schnier, Dolores Lory, MD   40 mg at 08/03/21 1307   polyethylene glycol (MIRALAX / GLYCOLAX) packet 17 g  17 g Oral BID Lin Landsman, MD   17 g at 08/04/21 0033   potassium chloride 10 mEq in 100 mL IVPB  10 mEq Intravenous Q1 Hr x 2 Oswald Hillock, RPH 100 mL/hr at 08/04/21 1044 10 mEq at 08/04/21 1044    potassium chloride SA (KLOR-CON) CR tablet 40 mEq  40 mEq Oral Antony Odea, MD       QUEtiapine (SEROQUEL) tablet 25 mg  25 mg Oral QHS Sharen Hones, MD       tamsulosin [facility]) capsule 0.4 mg  0.4 mg Oral Daily Schnier, Dolores Lory, MD   0.4 mg at 08/04/21 0933   traZODone (DESYREL) tablet 50 mg  50 mg Oral QHS Schnier, Dolores Lory, MD   50 mg at 08/04/21 0034     Discharge Medications: Please see discharge summary for a list of discharge medications.  Relevant Imaging Results:  Relevant Lab Results:   Additional Information  SS#: [id number]  Candie Chroman, LCSW

## 2021-08-04 NOTE — Progress Notes (Signed)
[facility] Liaison RN note  Received request from Transitions of Care Manager, [place], for hospice services after discharge. Chart and patient information under review by [facility] physician. Hospice eligibility pending at this time.  Kevin [name], [facility] Liaison, spoke with patient's niece Kevin [name] to initiate education related to hospice philosophy, services and team approach to care. Family verbalized understanding of information given. Per discussion, the plan is for discharge back to facility today by ambulance.  No DME needs at this time.  Please send signed and completed DNR home with patient and family. Please provide prescriptions at discharge as needed to ensure ongoing symptom management.  [facility] information and contact information given to [place]. Above information shared with TOC.  Please call with any questions or concerns.  Thank you for the opportunity to participate in this patient's care.  Margaretmary Eddy, BSN, [place] [phone]

## 2021-08-04 NOTE — Care Management Important Message (Signed)
Important Message  Patient Details  Name: Kevin [name] MRN: [id number] Date of Birth: 1924-12-19   Medicare Important Message Given:  Other (see comment)  Patient to discharge to facility with hospice services. Medicare IM withheld at this time out of respect for patient and family.    Dannette Barbara 08/04/2021, 2:48 PM

## 2021-08-04 NOTE — TOC Transition Note (Signed)
Transition of Care [facility]) - CM/SW Discharge Note   Patient Details  Name: Kevin [name] MRN: [id number] Date of Birth: May 17, 1925  Transition of Care [facility]) CM/SW Contact:  Candie Chroman, LCSW Phone Number: 08/04/2021, 1:12 PM   Clinical Narrative:  Patient has orders to discharge to [facility] with hosp[facility] today. RN will call report to [phone] (Room 355). EMS transport set up for 2:00. No further concerns. CSW signing off.   Final next level of care: [place] (with hospice) Barriers to Discharge: No Barriers Identified   Patient Goals and CMS Choice     Choice offered to / list presented to : [facility] POA / Guardian  Discharge Placement   Existing PASRR number confirmed : 08/04/21          Patient chooses bed at: [facility] Patient to be transferred to facility by: EMS Name of family member notified: Kevin [name] Patient and family notified of of transfer: 08/04/21  Discharge Plan and Services In-house Referral: Hospice / [place] Acute Care Choice: Hospice, [place]                               Social Determinants of Health (SDOH) Interventions     Readmission Risk Interventions No flowsheet data found.

## 2021-08-04 NOTE — Discharge Summary (Addendum)
Physician Discharge Summary  Patient ID: Kevin [name] MRN: [id number] DOB/AGE: 1925-05-28 87 y.o.  Admit date: 08/01/2021 Discharge date: 08/04/2021  Admission Diagnoses:  Discharge Diagnoses:  Principal Problem:   ABLA (acute blood loss anemia) Active Problems:   History of mitral valve replacement with mechanical valve   Depression   Benign essential HTN   GERD without esophagitis   Coronary artery disease involving autologous vein coronary bypass graft with angina pectoris (HCC)   Rectal bleeding   Chronic diastolic CHF (congestive heart failure) (HCC)   Acute blood loss anemia (ABLA)   Hypokalemia Severe protein calorie malnutrition  Discharged Condition: poor  Hospital Course:  Kevin [name] is a 87 y.o. male with medical history significant for hypertension, dyslipidemia, coronary artery disease, atrial fibrillation, mechanical valve on chronic anticoagulation therapy with Coumadin and chronic diastolic dysfunction CHF who was sent to the ER via EMS for evaluation of rectal bleeding. The staff at the skilled nursing facility noted that he had bowel movements containing blood and so EMS was called. CT angiogram of abdomen/pelvis showed bleeding within the distal descending/proximal sigmoid colon. Mesentery embolization of the inferior mesenteric artery was performed by Dr. Ronalee Belts on 8/23.  Patient also received vitamin K injection for elevated INR.   #1.  Acute blood loss anemia Rectal bleeding. Elevated INR due to warfarin. Iron deficient anemia. B12 deficient anemia. Patient is a status post embolization of inferior mesenteric artery. Patient received another unit of PRBC x2.  He is also treated with IV iron as well as injected B12. Hemoglobin is more stable today.  Continue oral iron and B12 supplement.  #2.  Mechanic mitral valve. Coronary artery disease status post CABG. Chronic diastolic congestive heart failure. Patient received IV Lasix before and after PRBC.   Currently, patient still has no volume overload.   Not able to start anticoagulation due to active bleeding.   3.  Essential hypertension  Continue beta-blocker only.  4.  Dementia. Mental status appears to be baseline   5.  Hypokalemia  Patient will be given 20 mEq IV and 80 mg oral potassium before discharge.  Patient condition appears to be terminal, due to active bleeding, not able to start anticoagulation.  Family does not want additional work-up with colonoscopy.  Patient lifespan is a probably is 12 3 months.  At this point, he will be transferred to nursing home with referral to hospice.  Medication added:  Seroquel 25 mg qhs. Haldol 1 mg oral q8 prn.    Consults: GI and vascular surgery  Significant Diagnostic Studies:   Treatments: PRBC, inferior mesenteric artery embolization.  Discharge Exam: Blood pressure (!) 96/42, pulse 60, temperature 98.5 F (36.9 C), temperature source Axillary, resp. rate 16, height '5\' 7"'$  (1.702 m), weight 57.1 kg, SpO2 96 %. General appearance: alert, cooperative, and pale and is severely malnourished. Resp: clear to auscultation bilaterally Cardio: regular rate and rhythm, S1, S2 normal, no murmur, click, rub or gallop GI: soft, non-tender; bowel sounds normal; no masses,  no organomegaly Extremities: extremities normal, atraumatic, no cyanosis or edema  Disposition: Discharge disposition: 03-Skilled Nursing Facility       Discharge Instructions     Ambulatory referral to Hospice   Complete by: As directed    Diet - low sodium heart healthy   Complete by: As directed    Increase activity slowly   Complete by: As directed       Allergies as of 08/04/2021       Reactions  Aspirin    Heart operation was told to never take Aspirin   Cymbalta [duloxetine Hcl] Other (See Comments)   Urinary retention, constipation and insomnia        Medication List     STOP taking these medications    amLODipine 5 MG  tablet Commonly known as: NORVASC   Coenzyme Q10 10 MG capsule   furosemide 20 MG tablet Commonly known as: LASIX   loperamide 2 MG capsule Commonly known as: IMODIUM   losartan 100 MG tablet Commonly known as: COZAAR   meclizine 25 MG tablet Commonly known as: ANTIVERT   niacinamide 500 MG tablet   Vitamin D 50 MCG (2000 UT) tablet   warfarin 1 MG tablet Commonly known as: COUMADIN   warfarin 3 MG tablet Commonly known as: COUMADIN   warfarin 4 MG tablet Commonly known as: COUMADIN       TAKE these medications    acetaminophen 325 MG tablet Commonly known as: TYLENOL Take 650 mg by mouth every 4 (four) hours as needed for fever. Maximum dose for 24 hours is 3000 mg from all sources of Acetaminophen/tylenol   brimonidine 0.15 % ophthalmic solution Commonly known as: ALPHAGAN Place 1 drop into both eyes 2 (two) times daily.   diclofenac Sodium 1 % Gel Commonly known as: VOLTAREN Apply 4 g topically 4 (four) times daily. For left knee pain   docusate sodium 100 MG capsule Commonly known as: Colace Take 2 capsules (200 mg total) by mouth at bedtime.   ENSURE ENLIVE PO Take 0.08 g by mouth every other day.   ferrous sulfate 325 (65 FE) MG tablet Take 1 tablet by mouth every other day.   latanoprost 0.005 % ophthalmic solution Commonly known as: XALATAN Place 1 drop into both eyes at bedtime.   lidocaine 5 % Commonly known as: LIDODERM Place 1 patch onto the skin daily. Remove & Discard patch within 12 hours or as directed by MD   melatonin 3 MG Tabs tablet Take 1 tablet by mouth at bedtime.   metoprolol tartrate 25 MG tablet Commonly known as: LOPRESSOR Take 25 mg by mouth 2 (two) times daily. Hold for BP less than 100/'s or pulse less than 60   mirtazapine 15 MG tablet Commonly known as: REMERON Take 7.5 mg by mouth at bedtime.   NON FORMULARY Diet Type: regular   omeprazole 40 MG capsule Commonly known as: PRILOSEC Take 40 mg by mouth 2  (two) times daily.   ondansetron 4 MG disintegrating tablet Commonly known as: ZOFRAN-ODT Take 4 mg by mouth every 4 (four) hours as needed for nausea or vomiting.   polyethylene glycol 17 g packet Commonly known as: MIRALAX / GLYCOLAX Take 17 g by mouth daily.   tamsulosin 0.4 MG Caps capsule Commonly known as: FLOMAX TAKE 1 CAPSULE BY MOUTH EVERY DAY   traZODone 50 MG tablet Commonly known as: DESYREL Take 50 mg by mouth at bedtime.   vitamin B-12 1000 MCG tablet Commonly known as: CYANOCOBALAMIN Take 1 tablet (1,000 mcg total) by mouth daily.        Follow-up Information     Kirk Ruths, MD Follow up.   Specialty: Internal Medicine Contact information: [place] [facility] [address] [place] [address] [phone]                35 minutes Signed: Sharen Hones 08/04/2021, 10:20 AM

## 2021-08-04 NOTE — Progress Notes (Signed)
Nsg Discharge Note   Report given to Adult nurse at [facility]. Niece Berline Chough took patient belongings with her-glasses and clothing. AVS placed in packet on chart. Waiting for PTAR.  Admit Date:  08/01/2021 Discharge date: 08/04/2021   Orland Mustard to be D/C'd Skilled nursing facility per MD order.  AVS completed.  Copy for chart, and copy for patient signed, and dated. Patient/caregiver able to verbalize understanding.  Discharge Medication: Allergies as of 08/04/2021       Reactions   Aspirin    Heart operation was told to never take Aspirin   Cymbalta [duloxetine Hcl] Other (See Comments)   Urinary retention, constipation and insomnia        Medication List     STOP taking these medications    amLODipine 5 MG tablet Commonly known as: NORVASC   Coenzyme Q10 10 MG capsule   furosemide 20 MG tablet Commonly known as: LASIX   loperamide 2 MG capsule Commonly known as: IMODIUM   losartan 100 MG tablet Commonly known as: COZAAR   meclizine 25 MG tablet Commonly known as: ANTIVERT   niacinamide 500 MG tablet   Vitamin D 50 MCG (2000 UT) tablet   warfarin 1 MG tablet Commonly known as: COUMADIN   warfarin 3 MG tablet Commonly known as: COUMADIN   warfarin 4 MG tablet Commonly known as: COUMADIN       TAKE these medications    acetaminophen 325 MG tablet Commonly known as: TYLENOL Take 650 mg by mouth every 4 (four) hours as needed for fever. Maximum dose for 24 hours is 3000 mg from all sources of Acetaminophen/tylenol   brimonidine 0.15 % ophthalmic solution Commonly known as: ALPHAGAN Place 1 drop into both eyes 2 (two) times daily.   diclofenac Sodium 1 % Gel Commonly known as: VOLTAREN Apply 4 g topically 4 (four) times daily. For left knee pain   docusate sodium 100 MG capsule Commonly known as: Colace Take 2 capsules (200 mg total) by mouth at bedtime.   ENSURE ENLIVE PO Take 0.08 g by mouth every other day.   ferrous sulfate 325 (65  FE) MG tablet Take 1 tablet by mouth every other day.   haloperidol 1 MG tablet Commonly known as: HALDOL Take 1 tablet (1 mg total) by mouth every 8 (eight) hours as needed for agitation.   latanoprost 0.005 % ophthalmic solution Commonly known as: XALATAN Place 1 drop into both eyes at bedtime.   lidocaine 5 % Commonly known as: LIDODERM Place 1 patch onto the skin daily. Remove & Discard patch within 12 hours or as directed by MD   melatonin 3 MG Tabs tablet Take 1 tablet by mouth at bedtime.   metoprolol tartrate 25 MG tablet Commonly known as: LOPRESSOR Take 25 mg by mouth 2 (two) times daily. Hold for BP less than 100/'s or pulse less than 60   mirtazapine 15 MG tablet Commonly known as: REMERON Take 7.5 mg by mouth at bedtime.   NON FORMULARY Diet Type: regular   omeprazole 40 MG capsule Commonly known as: PRILOSEC Take 40 mg by mouth 2 (two) times daily.   ondansetron 4 MG disintegrating tablet Commonly known as: ZOFRAN-ODT Take 4 mg by mouth every 4 (four) hours as needed for nausea or vomiting.   polyethylene glycol 17 g packet Commonly known as: MIRALAX / GLYCOLAX Take 17 g by mouth daily.   QUEtiapine 25 MG tablet Commonly known as: SEROQUEL Take 1 tablet (25 mg total) by  mouth at bedtime.   tamsulosin 0.4 MG Caps capsule Commonly known as: FLOMAX TAKE 1 CAPSULE BY MOUTH EVERY DAY   traZODone 50 MG tablet Commonly known as: DESYREL Take 50 mg by mouth at bedtime.   vitamin B-12 1000 MCG tablet Commonly known as: CYANOCOBALAMIN Take 1 tablet (1,000 mcg total) by mouth daily.        Discharge Assessment: Vitals:   08/04/21 0351 08/04/21 0730  BP:  (!) 96/42  Pulse: 71 60  Resp:  16  Temp:  98.5 F (36.9 C)  SpO2: 100% 96%   Skin clean, dry and intact without evidence of skin break down, no evidence of skin tears noted. IV catheter discontinued intact. Site without signs and symptoms of complications - no redness or edema noted at  insertion site, patient denies c/o pain - only slight tenderness at site.  Dressing with slight pressure applied.  D/c Instructions-Education: Discharge instructions given to patient/family with verbalized understanding. D/c education completed with patient/family including follow up instructions, medication list, d/c activities limitations if indicated, with other d/c instructions as indicated by MD. Patient escorted via Talmadge Chad, RN 08/04/2021 2:32 PM

## 2021-08-04 NOTE — TOC Initial Note (Signed)
Transition of Care [facility]) - Initial/Assessment Note    Patient Details  Name: Kevin [name] MRN: [id number] Date of Birth: 08-12-1925  [facility] CM/SW Contact:    Candie Chroman, LCSW Phone Number: 08/04/2021, 10:57 AM  Clinical Narrative: Patient is from the Memory Care at [facility]. Per MD, plan to return there today with hospice. Admissions coordinator said he would have to come over to the SNF side to receive hospice services. They use [facility]. CSW called niece and confirmed plan. Referral made to Margaretmary Eddy, RN with [facility]. No further concerns. CSW encouraged patient's niece to contact CSW as needed. CSW will continue to follow patient and his niece for support and facilitate discharge to SNF with hospice today.  Expected Discharge Plan: [place] (with hospice) Barriers to Discharge: No Barriers Identified   Patient Goals and CMS Choice     Choice offered to / list presented to : [place] / Guardian  Expected Discharge Plan and Services Expected Discharge Plan: [place] (with hospice) In-house Referral: Hospice / [place] Acute Care Choice: Hospice, [place] Living arrangements for the past 2 months: [place] (Memory care) Expected Discharge Date: 08/04/21                                    Prior Living Arrangements/Services Living arrangements for the past 2 months: [place] (Memory care) Lives with:: Facility Resident Patient language and need for interpreter reviewed:: Yes Do you feel safe going back to the place where you live?: Yes      Need for Family Participation in Patient Care: Yes (Comment) Care giver support system in place?: Yes (comment)   Criminal Activity/Legal Involvement Pertinent to Current Situation/Hospitalization: No - Comment as needed  Activities of Daily Living   ADL Screening (condition at time of  admission) Patient's cognitive ability adequate to safely complete daily activities?: No Is the patient deaf or have difficulty hearing?: Yes Does the patient have difficulty concentrating, remembering, or making decisions?: Yes Patient able to express need for assistance with ADLs?: No Does the patient have difficulty dressing or bathing?: Yes Independently performs ADLs?: No Communication: Dependent Dressing (OT): Dependent Grooming: Dependent Feeding: Dependent Bathing: Dependent Toileting: Dependent In/Out Bed: Dependent Walks in Home: Dependent Does the patient have difficulty walking or climbing stairs?: Yes  Permission Sought/Granted Permission sought to share information with : Facility Sport and exercise psychologist, Family Supports    Share Information with NAME: Chrissie Noa  Permission granted to share info w AGENCY: [place] granted to share info w Relationship: Niece/HCPOA  Permission granted to share info w Contact Information: [phone]  Emotional Assessment Appearance:: Appears stated age Attitude/Demeanor/Rapport: Unable to Assess Affect (typically observed): Unable to Assess Orientation: :  (Disoriented x 4) Alcohol / Substance Use: Not Applicable Psych Involvement: No (comment)  Admission diagnosis:  Rectal bleeding [K62.5] Lower GI bleed [K92.2] Mechanical heart valve present [Z95.2] Anticoagulated on Coumadin [Z79.01] Acute blood loss anemia (ABLA) [D62] Patient Active Problem List   Diagnosis Date Noted   Hypokalemia 08/03/2021   Rectal bleeding 08/01/2021   Chronic diastolic CHF (congestive heart failure) ([place]) 08/01/2021   Acute blood loss anemia (ABLA) 08/01/2021   Uncontrolled hypertension 05/25/2019   Insomnia 05/25/2019   Pacemaker [id number]   Chronic systolic heart failure ([place]) 10/18/2018   Hypertensive heart disease with systolic congestive  heart failure ([place]) 10/18/2018   Coronary artery disease involving  autologous vein coronary bypass graft with angina pectoris ([place]) 10/18/2018   Chronic idiopathic constipation 10/18/2018   Chronic angle-closure glaucoma, bilateral 10/18/2018   Right leg pain 09/15/2018   Cognitive decline 06/19/2018   Protein-calorie malnutrition ([place]) 06/09/2018   Primary cancer of skin of forehead 05/27/2018   Jaw hematoma 05/14/2018   Ringworm of foot 04/15/2018   Hyponatremia 03/08/2018   Benign prostatic hyperplasia with urinary hesitancy 02/21/2018   Constipation 12/12/2017   Large hiatal hernia 12/12/2017   Encounter for preventive health examination 03/30/2017   Generalized muscle weakness 07/24/2016   Major depressive disorder, recurrent episode ([place]) 01/24/2016   Vitamin D deficiency 01/24/2016   Fatigue 01/23/2016   Benign essential HTN 03/04/2015   GERD without esophagitis 03/04/2015   H/O prosthetic heart valve 03/04/2015   Dizziness 02/21/2015   Cardiomyopathy ([place]) 03/09/2014   Hyperlipidemia 04/27/2013   Sciatica of right side 03/16/2013   Anemia, iron deficiency 09/15/2012   Osteoporosis, senile 09/15/2012   Atrial flutter ([place]) 06/26/2012   ABLA (acute blood loss anemia) 06/14/2012   Depression 12/24/2011   Neuropathy of lower extremity 08/25/2011   Atrial fibrillation ([place])    History of mitral valve replacement with mechanical valve    Long term current use of anticoagulants with INR goal of 2.5-3.5 08/22/2011   PCP:  Kirk Ruths, MD Pharmacy:   [facility] [address], [address] [place] [address] Phone: [phone] Fax: [phone]     Social Determinants of Health (SDOH) Interventions    Readmission Risk Interventions No flowsheet data found.

## 2021-08-05 LAB — TYPE AND SCREEN
ABO/RH(D): O POS
Antibody Screen: NEGATIVE
Unit division: 0
Unit division: 0
Unit division: 0
Unit division: 0

## 2021-08-05 LAB — BPAM RBC
Blood Product Expiration Date: 202209242359
Blood Product Expiration Date: 202209272359
Blood Product Expiration Date: 202209282359
Blood Product Expiration Date: 202209282359
ISSUE DATE / TIME: 202208231600
ISSUE DATE / TIME: 202208242202
ISSUE DATE / TIME: 202208260014
ISSUE DATE / TIME: 202208261449
Unit Type and Rh: 5100
Unit Type and Rh: 5100
Unit Type and Rh: 5100
Unit Type and Rh: 5100

## 2021-09-09 DEATH — deceased

## 2022-11-20 NOTE — Telephone Encounter (Signed)
Error
